# Patient Record
Sex: Male | Born: 1960
Health system: Southern US, Community
[De-identification: ages and names within clinical notes are randomized; demographics above are authoritative.]

## PROBLEM LIST (undated history)

## (undated) DIAGNOSIS — M1990 Unspecified osteoarthritis, unspecified site: Secondary | ICD-10-CM

## (undated) DIAGNOSIS — E785 Hyperlipidemia, unspecified: Secondary | ICD-10-CM

## (undated) DIAGNOSIS — I2510 Atherosclerotic heart disease of native coronary artery without angina pectoris: Secondary | ICD-10-CM

## (undated) DIAGNOSIS — R7303 Prediabetes: Secondary | ICD-10-CM

## (undated) DIAGNOSIS — I509 Heart failure, unspecified: Secondary | ICD-10-CM

## (undated) DIAGNOSIS — I350 Nonrheumatic aortic (valve) stenosis: Secondary | ICD-10-CM

## (undated) DIAGNOSIS — E669 Obesity, unspecified: Secondary | ICD-10-CM

## (undated) DIAGNOSIS — I10 Essential (primary) hypertension: Secondary | ICD-10-CM

## (undated) HISTORY — DX: Atherosclerotic heart disease of native coronary artery without angina pectoris: I25.10

## (undated) HISTORY — DX: Hyperlipidemia, unspecified: E78.5

## (undated) HISTORY — PX: TONSILLECTOMY: SUR1361

---

## 2016-12-26 ENCOUNTER — Emergency Department (HOSPITAL_COMMUNITY): Payer: Medicaid Other

## 2016-12-26 ENCOUNTER — Encounter (HOSPITAL_COMMUNITY): Payer: Self-pay | Admitting: *Deleted

## 2016-12-26 ENCOUNTER — Inpatient Hospital Stay (HOSPITAL_COMMUNITY)
Admission: EM | Admit: 2016-12-26 | Discharge: 2016-12-31 | DRG: 247 | Disposition: A | Discharge status: Home / Self Care | Payer: Medicaid Other | Attending: Family Medicine | Admitting: Family Medicine

## 2016-12-26 DIAGNOSIS — I351 Nonrheumatic aortic (valve) insufficiency: Secondary | ICD-10-CM | POA: Diagnosis not present

## 2016-12-26 DIAGNOSIS — E876 Hypokalemia: Secondary | ICD-10-CM | POA: Diagnosis not present

## 2016-12-26 DIAGNOSIS — G4733 Obstructive sleep apnea (adult) (pediatric): Secondary | ICD-10-CM | POA: Diagnosis not present

## 2016-12-26 DIAGNOSIS — I5021 Acute systolic (congestive) heart failure: Secondary | ICD-10-CM | POA: Diagnosis not present

## 2016-12-26 DIAGNOSIS — I472 Ventricular tachycardia: Secondary | ICD-10-CM | POA: Diagnosis not present

## 2016-12-26 DIAGNOSIS — I340 Nonrheumatic mitral (valve) insufficiency: Secondary | ICD-10-CM

## 2016-12-26 DIAGNOSIS — Z8249 Family history of ischemic heart disease and other diseases of the circulatory system: Secondary | ICD-10-CM

## 2016-12-26 DIAGNOSIS — I272 Pulmonary hypertension, unspecified: Secondary | ICD-10-CM | POA: Diagnosis present

## 2016-12-26 DIAGNOSIS — E785 Hyperlipidemia, unspecified: Secondary | ICD-10-CM | POA: Diagnosis present

## 2016-12-26 DIAGNOSIS — I2510 Atherosclerotic heart disease of native coronary artery without angina pectoris: Secondary | ICD-10-CM | POA: Diagnosis present

## 2016-12-26 DIAGNOSIS — N289 Disorder of kidney and ureter, unspecified: Secondary | ICD-10-CM | POA: Diagnosis not present

## 2016-12-26 DIAGNOSIS — R0609 Other forms of dyspnea: Secondary | ICD-10-CM | POA: Diagnosis not present

## 2016-12-26 DIAGNOSIS — R001 Bradycardia, unspecified: Secondary | ICD-10-CM | POA: Diagnosis not present

## 2016-12-26 DIAGNOSIS — I5041 Acute combined systolic (congestive) and diastolic (congestive) heart failure: Secondary | ICD-10-CM | POA: Diagnosis not present

## 2016-12-26 DIAGNOSIS — M1990 Unspecified osteoarthritis, unspecified site: Secondary | ICD-10-CM | POA: Diagnosis present

## 2016-12-26 DIAGNOSIS — I493 Ventricular premature depolarization: Secondary | ICD-10-CM | POA: Diagnosis not present

## 2016-12-26 DIAGNOSIS — I420 Dilated cardiomyopathy: Secondary | ICD-10-CM | POA: Diagnosis present

## 2016-12-26 DIAGNOSIS — Z87891 Personal history of nicotine dependence: Secondary | ICD-10-CM

## 2016-12-26 DIAGNOSIS — I350 Nonrheumatic aortic (valve) stenosis: Secondary | ICD-10-CM | POA: Diagnosis not present

## 2016-12-26 DIAGNOSIS — R739 Hyperglycemia, unspecified: Secondary | ICD-10-CM | POA: Diagnosis not present

## 2016-12-26 DIAGNOSIS — R7303 Prediabetes: Secondary | ICD-10-CM | POA: Diagnosis present

## 2016-12-26 DIAGNOSIS — R0600 Dyspnea, unspecified: Secondary | ICD-10-CM | POA: Diagnosis present

## 2016-12-26 DIAGNOSIS — I110 Hypertensive heart disease with heart failure: Secondary | ICD-10-CM | POA: Diagnosis not present

## 2016-12-26 DIAGNOSIS — E669 Obesity, unspecified: Secondary | ICD-10-CM | POA: Diagnosis not present

## 2016-12-26 DIAGNOSIS — Z791 Long term (current) use of non-steroidal anti-inflammatories (NSAID): Secondary | ICD-10-CM

## 2016-12-26 DIAGNOSIS — I5040 Unspecified combined systolic (congestive) and diastolic (congestive) heart failure: Secondary | ICD-10-CM | POA: Insufficient documentation

## 2016-12-26 DIAGNOSIS — I509 Heart failure, unspecified: Secondary | ICD-10-CM

## 2016-12-26 DIAGNOSIS — I080 Rheumatic disorders of both mitral and aortic valves: Secondary | ICD-10-CM | POA: Diagnosis not present

## 2016-12-26 DIAGNOSIS — M1611 Unilateral primary osteoarthritis, right hip: Secondary | ICD-10-CM | POA: Diagnosis present

## 2016-12-26 DIAGNOSIS — I10 Essential (primary) hypertension: Secondary | ICD-10-CM | POA: Diagnosis not present

## 2016-12-26 DIAGNOSIS — Z955 Presence of coronary angioplasty implant and graft: Secondary | ICD-10-CM

## 2016-12-26 HISTORY — DX: Unspecified osteoarthritis, unspecified site: M19.90

## 2016-12-26 HISTORY — DX: Atherosclerotic heart disease of native coronary artery without angina pectoris: I25.10

## 2016-12-26 HISTORY — DX: Obesity, unspecified: E66.9

## 2016-12-26 HISTORY — DX: Prediabetes: R73.03

## 2016-12-26 HISTORY — DX: Essential (primary) hypertension: I10

## 2016-12-26 LAB — BASIC METABOLIC PANEL
Anion gap: 8 (ref 5–15)
BUN: 14 mg/dL (ref 6–20)
CHLORIDE: 104 mmol/L (ref 101–111)
CO2: 29 mmol/L (ref 22–32)
Calcium: 9.4 mg/dL (ref 8.9–10.3)
Creatinine, Ser: 1.18 mg/dL (ref 0.61–1.24)
GFR calc Af Amer: >60 mL/min (ref >60)
GFR calc non Af Amer: >60 mL/min (ref >60)
GLUCOSE: 149 mg/dL — AB (ref 65–99)
Potassium: 4.5 mmol/L (ref 3.5–5.1)
SODIUM: 141 mmol/L (ref 135–145)

## 2016-12-26 LAB — CBC
HEMATOCRIT: 46.4 % (ref 39.0–52.0)
HEMATOCRIT: 46.3 % (ref 39.0–52.0)
HEMOGLOBIN: 15.2 g/dL (ref 13.0–17.0)
Hemoglobin: 15.1 g/dL (ref 13.0–17.0)
MCH: 29.3 pg (ref 26.0–34.0)
MCH: 29.6 pg (ref 26.0–34.0)
MCHC: 32.5 g/dL (ref 30.0–36.0)
MCHC: 32.8 g/dL (ref 30.0–36.0)
MCV: 89.9 fL (ref 78.0–100.0)
MCV: 90.1 fL (ref 78.0–100.0)
Platelets: 168 10*3/uL (ref 150–400)
Platelets: 174 10*3/uL (ref 150–400)
RBC: 5.16 MIL/uL (ref 4.22–5.81)
RBC: 5.14 MIL/uL (ref 4.22–5.81)
RDW: 13.7 % (ref 11.5–15.5)
RDW: 13.8 % (ref 11.5–15.5)
WBC: 9 10*3/uL (ref 4.0–10.5)
WBC: 8.3 10*3/uL (ref 4.0–10.5)

## 2016-12-26 LAB — I-STAT TROPONIN, ED: Troponin i, poc: 0.02 ng/mL (ref 0.00–0.08)

## 2016-12-26 LAB — BRAIN NATRIURETIC PEPTIDE: B NATRIURETIC PEPTIDE 5: 1551 pg/mL — AB (ref 0.0–100.0)

## 2016-12-26 LAB — CREATININE, SERUM
Creatinine, Ser: 1.14 mg/dL (ref 0.61–1.24)
GFR calc Af Amer: >60 mL/min (ref >60)
GFR calc non Af Amer: >60 mL/min (ref >60)

## 2016-12-26 LAB — TROPONIN I: TROPONIN I: 0.03 ng/mL — AB (ref <0.03)

## 2016-12-26 LAB — GLUCOSE, CAPILLARY: GLUCOSE-CAPILLARY: 117 mg/dL — AB (ref 65–99)

## 2016-12-26 MED ORDER — FUROSEMIDE 10 MG/ML IJ SOLN
40.0000 mg | Freq: Once | INTRAMUSCULAR | Status: AC
  Administered 2016-12-26: 40 mg via INTRAVENOUS
  Filled 2016-12-26: qty 4

## 2016-12-26 MED ORDER — ENOXAPARIN SODIUM 40 MG/0.4ML ~~LOC~~ SOLN
40.0000 mg | SUBCUTANEOUS | Status: DC
  Administered 2016-12-26 – 2016-12-30 (×4): 40 mg via SUBCUTANEOUS
  Filled 2016-12-26 (×4): qty 0.4

## 2016-12-26 MED ORDER — LABETALOL HCL 5 MG/ML IV SOLN: 10.0000 mg | INTRAVENOUS | Status: DC | PRN

## 2016-12-26 MED ORDER — ASPIRIN 81 MG PO CHEW
324.0000 mg | CHEWABLE_TABLET | Freq: Once | ORAL | Status: AC
  Administered 2016-12-26: 324 mg via ORAL
  Filled 2016-12-26: qty 4

## 2016-12-26 MED ORDER — ACETAMINOPHEN 325 MG PO TABS: 650.0000 mg | ORAL_TABLET | Freq: Four times a day (QID) | ORAL | Status: DC | PRN

## 2016-12-26 MED ORDER — LISINOPRIL 10 MG PO TABS
10.0000 mg | ORAL_TABLET | Freq: Every day | ORAL | Status: DC
  Administered 2016-12-26: 10 mg via ORAL
  Filled 2016-12-26 (×2): qty 1

## 2016-12-26 MED ORDER — ACETAMINOPHEN 650 MG RE SUPP: 650.0000 mg | Freq: Four times a day (QID) | RECTAL | Status: DC | PRN

## 2016-12-26 MED ORDER — INFLUENZA VAC SPLIT QUAD 0.5 ML IM SUSY
0.5000 mL | PREFILLED_SYRINGE | INTRAMUSCULAR | Status: AC
Start: 2016-12-27 — End: 2016-12-27
  Administered 2016-12-27: 0.5 mL via INTRAMUSCULAR
  Filled 2016-12-26: qty 0.5

## 2016-12-26 NOTE — ED Provider Notes (Signed)
[facility] DEPT Provider Note   CSN: [id number] Arrival date & time: 12/26/16  1419     History   Chief Complaint Chief Complaint  Patient presents with  . Shortness of Breath  . Chest Pain    HPI Joshua [name] is a 56 y.o. male.  HPI   For the last month has had progressive shortness of breath and chest pain.  At night waking up feeling short of breath, having to sit up in the middle of the night.  Has significant dyspnea on exertion.  Even walking short distances will feel dyspnea.  Just walking into the ED felt dyspnea. Severe. Chest pressure tightness when feeling short of breath.   Past Medical History:  Diagnosis Date  . Hypertension     Patient Active Problem List   Diagnosis Date Noted  . Acute congestive heart failure ([place])   . Hypertension   . Hyperglycemia   . Dyspnea 12/26/2016    History reviewed. No pertinent surgical history.     Home Medications    Prior to Admission medications   Not on File    Family History History reviewed. No pertinent family history.  Social History Social History  Substance Use Topics  . Smoking status: Former Research scientist (life sciences)  . Smokeless tobacco: Never Used  . Alcohol use Yes     Comment: occ     Allergies   Patient has no known allergies.   Review of Systems Review of Systems  Constitutional: Positive for fatigue. Negative for fever.  HENT: Negative for sore throat.   Eyes: Negative for visual disturbance.  Respiratory: Positive for chest tightness, shortness of breath and wheezing.   Cardiovascular: Positive for chest pain and leg swelling.  Gastrointestinal: Negative for abdominal pain, nausea and vomiting.  Genitourinary: Negative for difficulty urinating.  Musculoskeletal: Negative for back pain and neck stiffness.  Skin: Negative for rash.  Neurological: Positive for light-headedness. Negative for syncope and headaches.     Physical Exam Updated Vital Signs BP (!) 175/90 (BP Location:  Right Arm)   Pulse 70   Temp 97.9 F (36.6 C) (Oral)   Resp 18   Ht 6\' 1"  (1.854 m)   Wt 112.9 kg (248 lb 14.4 oz)   SpO2 98%   BMI 32.84 kg/m   Physical Exam  Constitutional: He is oriented to person, place, and time. He appears well-developed and well-nourished. No distress.  HENT:  Head: Normocephalic and atraumatic.  Eyes: Conjunctivae and EOM are normal.  Neck: Normal range of motion. JVD present.  Cardiovascular: Normal rate, regular rhythm, normal heart sounds and intact distal pulses.  Exam reveals no gallop and no friction rub.   No murmur heard. Pulmonary/Chest: Effort normal. No respiratory distress. He has no wheezes. He has rales.  Abdominal: Soft. He exhibits no distension. There is no tenderness. There is no guarding.  Musculoskeletal: He exhibits edema (2+).  Neurological: He is alert and oriented to person, place, and time.  Skin: Skin is warm and dry. He is not diaphoretic.  Nursing note and vitals reviewed.    ED Treatments / Results  Labs (all labs ordered are listed, but only abnormal results are displayed) Labs Reviewed  BASIC METABOLIC PANEL - Abnormal; Notable for the following:       Result Value   Glucose, Bld 149 (*)    All other components within normal limits  BRAIN NATRIURETIC PEPTIDE - Abnormal; Notable for the following:    B Natriuretic Peptide 1,551.0 (*)  All other components within normal limits  TROPONIN I - Abnormal; Notable for the following:    Troponin I 0.03 (*)    All other components within normal limits  TROPONIN I - Abnormal; Notable for the following:    Troponin I 0.03 (*)    All other components within normal limits  BASIC METABOLIC PANEL - Abnormal; Notable for the following:    Glucose, Bld 117 (*)    All other components within normal limits  GLUCOSE, CAPILLARY - Abnormal; Notable for the following:    Glucose-Capillary 117 (*)    All other components within normal limits  HEMOGLOBIN A1C - Abnormal; Notable for  the following:    Hgb A1c MFr Bld 5.8 (*)    All other components within normal limits  LIPID PANEL - Abnormal; Notable for the following:    HDL 29 (*)    All other components within normal limits  CBC  CBC  CREATININE, SERUM  TROPONIN I  CBC  TSH  HIV ANTIBODY (ROUTINE TESTING)  I-STAT TROPONIN, ED    EKG  EKG Interpretation  Date/Time:  Friday December 26 2016 14:30:13 EDT Ventricular Rate:  78 PR Interval:  176 QRS Duration: 98 QT Interval:  408 QTC Calculation: 465 R Axis:   53 Text Interpretation:  Normal sinus rhythm Nonspecific ST and T wave abnormality Abnormal ECG No previous ECGs available Confirmed by Gareth Morgan [id number]) on 12/26/2016 4:37:18 PM       Radiology Dg Chest 2 View  Result Date: 12/26/2016 CLINICAL DATA:  Chest tightness. EXAM: CHEST  2 VIEW COMPARISON:  None. FINDINGS: Cardiomegaly with pulmonary vascular prominence and bilateral interstitial prominence with bilateral pleural effusions. No pneumothorax. No pneumothorax. No acute bony abnormality. IMPRESSION: Congestive heart failure with pulmonary interstitial edema and bilateral pleural effusions. Electronically Signed   By: Marcello Moores  Register   On: 12/26/2016 15:04    Procedures Procedures (including critical care time)  Medications Ordered in ED Medications  enoxaparin (LOVENOX) injection 40 mg (40 mg Subcutaneous Given 12/26/16 2101)  acetaminophen (TYLENOL) tablet 650 mg (not administered)    Or  acetaminophen (TYLENOL) suppository 650 mg (not administered)  labetalol (NORMODYNE,TRANDATE) injection 10 mg (not administered)  atorvastatin (LIPITOR) tablet 20 mg (not administered)  aspirin EC tablet 81 mg (81 mg Oral Given 12/27/16 1000)  lisinopril (PRINIVIL,ZESTRIL) tablet 20 mg (20 mg Oral Given 12/27/16 1130)  furosemide (LASIX) injection 40 mg (40 mg Intravenous Given 12/26/16 1709)  aspirin chewable tablet 324 mg (324 mg Oral Given 12/26/16 1704)  Influenza vac split quadrivalent PF  (FLUARIX) injection 0.5 mL (0.5 mLs Intramuscular Given 12/27/16 1000)  furosemide (LASIX) injection 40 mg (40 mg Intravenous Given 12/27/16 0815)     Initial Impression / Assessment and Plan / ED Course  I have reviewed the triage vital signs and the nursing notes.  Pertinent labs & imaging results that were available during my care of the patient were reviewed by me and considered in my medical decision making (see chart for details).     56yo male with history of hypertension presents for 4 weeks of increasing dyspnea, exertional dyspnea, chest tightness, bilateral lower extremity edema.  History and clinical exam concerning for new diagnosis of CHF.  Troponin negative. Given aspirin and lasix. Will admit for continued evaluation.   Final Clinical Impressions(s) / ED Diagnoses   Final diagnoses:  Acute congestive heart failure, unspecified heart failure type [facility])    New Prescriptions There are no discharge medications for this patient.  Gareth Morgan, MD 12/27/16 [phone]

## 2016-12-26 NOTE — ED Notes (Signed)
Attempted report x1. 

## 2016-12-26 NOTE — H&P (Signed)
Joshua [name] Admission History and Physical Service Pager: [phone]  Patient name: Joshua [name] [facility] [name] Medical record number: [id number] Date of birth: 17-Sep-1960 Age: 56 y.o. Gender: male  Primary Care Provider: Patient, No Pcp Per Consultants: none Code Status: full (obtained on admission).   Chief Complaint: Shortness of breath  Assessment and Plan: Joshua HAGERMAN is a 56 y.o. male presenting with gradually worsening dyspnea . PMH is significant for HTN (not on medication), ?OSA and osteoarthritis  Dyspnea: Likely due to new CHF from long-standing untreated hypertension and NSAID use. He has orthopnea, PND and bilateral edema. Exam remarkable for bibasilar crackles. He also have 2/6 SEM over RUSB and LVH concerning for some aortic stenosis. CXR consistent with CHF. BNP is still pending. He has remarkable urine output (about 2 L) and significant improvement in his symptoms after IV Lasix 40 mg in ED. He also reports chest tightness likely due to CHF versus ACS. EKG without ischemic changes. Initial troponin negative. His chest pain-free now after diuresis. Doubt PE with risk score 0. No fever or leukocytosis to suggest infectious etiology. -Admit to telemetry. Attending Dr. Mingo Amber -Redose Lasix in the morning -Follow up BNP -Strict I&O and daily weights -Echocardiogram -Repeat BMP in the morning -Rule out ACS as below -Treat hypertension as below  Chest tightness: Likely due to CHF versus ACS. He is chest pain-free now. No history of CAD. Initial EKG without significant ischemic finding. Initial troponin negative. Status post ASA 325 mg once in ED. Given significant family history (father with CABG at 28 years of age) we will cycle troponins and EKG to rule out ACS. -Cycle troponin -Repeat EKG in the morning -CHF as above -Risk stratification lab (lipid panel and A1c  Hypertension: Initial BP 174/109 in ED. Trended down to 152/101 with IV Lasix. Not  on medication at home. -Started lisinopril 10 mg daily. He has good renal function -When necessary labetalol IV 10 mg for SBP greater than 180 or D BP greater than 100 -BMP as above -May need a sleep study as outpatient to rule out OSA. Sister reports history of snoring.   History of prediabetes: He says he used to be on metformin. He says his last A1c was 5.6 many years ago. BMP glucose 149. -CBG every 12 hours -Can initiate SSI based on CBG -Check A1c  Osteoarthritis of right hip: this is a chronic condition. Has been managing his pain with NSAID -Advised him to stop NSAID -Tylenol as needed  FEN/GI:  -Heart healthy diet  Prophylaxis:  Lovenox  Disposition: Admit to telemetry for evaluation and management of new CHF  History of Present Illness:  Joshua [name] is a 56 y.o. male presenting with dyspnea. History provided by the patient and his sister at bedside.   Patient reports having dyspnea for about a month that has gotten worse over the last 2 weeks. He also endorses paroxysmal nocturnal dyspnea, and orthopnea when he lies on his backfor the last 3 weeks. He says he normally sleeps on his side. He still uses only one pillow at night. He also reports some chest tightness and pressure sensation. Denies radiation to his jaw or his arms. Denies nausea or diaphoresis. He is chest pain-free now. He also noticed some leg swelling bilaterally. He denies recent long car ride or travel. He has cough with clear sputum for the last 3 weeks. He denies hemoptysis.  Patient has history of hypertension. He hasn't been on any medication for the last 5  years. He is also on ibuprofen and Excedrin for osteoarthritis of his right hip. He reports watching his salt intake.Marland Kitchen He drinks socially. He denies recreational drug use except smoking marijuana. He quit smoking cigarettes about 10 years ago. He has about 10-pack-year history prior to that. He denies recent illness.  He was treated for left leg  cellulitis with a 10 days course of Bactrim about 3 months ago.  ED course: Vital signs remarkable for blood pressure to 174/109 on arrival to ED. BMP, CBC and initial troponin basically unremarkable. ECG without significant ischemic changes but LVH. CXR with CHF with pulmonary interstitial edema and bilateral pleural effusions. BNP pending. He was given IV Lasix 40 mg and put out about 2 L of urine already. He has significant improvement in his dyspnea.   Review Of Systems:   Review of Systems  Constitutional: Negative for chills, diaphoresis and fever.  HENT: Negative for ear pain and tinnitus.   Eyes: Negative for blurred vision and photophobia.  Respiratory: Positive for cough, sputum production and shortness of breath. Negative for hemoptysis and wheezing.        Clear sputum  Cardiovascular: Positive for orthopnea, leg swelling and PND. Negative for chest pain.  Gastrointestinal: Negative for abdominal pain, blood in stool, diarrhea, heartburn, melena and vomiting.  Genitourinary: Negative for dysuria and hematuria.  Musculoskeletal: Negative for back pain and myalgias.  Skin: Negative for rash.  Neurological: Negative for dizziness, sensory change, speech change, focal weakness and headaches.  Psychiatric/Behavioral: Positive for substance abuse. Negative for depression.       Marijuana   There are no active problems to display for this patient.   Past Medical History: Past Medical History:  Diagnosis Date  . Hypertension     Past Surgical History: History reviewed. No pertinent surgical history.  Social History: Social History  Substance Use Topics  . Smoking status: Former Research scientist (life sciences)  . Smokeless tobacco: Not on file  . Alcohol use Yes     Comment: occ   Additional social history: Smokes marijuana Please also refer to relevant sections of EMR.  Family History: History reviewed. No pertinent family history. Father with heart attack and CABG at age 35  Allergies and  Medications: No Known Allergies No current facility-administered medications on file prior to encounter.    No current outpatient prescriptions on file prior to encounter.    Objective: BP (!) 152/101   Pulse 73   Temp 98.3 F (36.8 C) (Oral)   Resp (!) 23   SpO2 94%  Exam: GEN: appears well, no apparent distress. Head: normocephalic and atraumatic  Eyes: conjunctiva without injection, sclera anicteric Oropharynx: mmm without erythema or exudation HEM: negative for cervical or periauricular lymphadenopathies CVS: RRR, nl s1 & s2, 2/6 SEM over RUSB, trace edema bilaterally, no JVD or HJR RESP: no IWOB, good air movement bilaterally, fine bibasilar crackles bilaterally, no wheezing GI: Limited exam due to body habitus, BS present & normal, soft, NTND GU: no suprapubic or CVA tenderness. About 2 L of clear looking urine in 2 urinals MSK: no focal tenderness or notable swelling SKIN: Small scars on his legs bilaterally. No signs of infection or cellulitis. Likely from previous cellulitis infections ENDO: negative thyromegally NEURO: alert and oiented appropriately, no gross deficits  PSYCH: euthymic mood with congruent affect  Labs and Imaging: CBC BMET   Recent Labs Lab 12/26/16 1441  WBC 9.0  HGB 15.1  HCT 46.4  PLT 168    Recent Labs Lab 12/26/16  1441  NA 141  K 4.5  CL 104  CO2 29  BUN 14  CREATININE 1.18  GLUCOSE 149*  CALCIUM 9.4     Dg Chest 2 View  Result Date: 12/26/2016 CLINICAL DATA:  Chest tightness. EXAM: CHEST  2 VIEW COMPARISON:  None. FINDINGS: Cardiomegaly with pulmonary vascular prominence and bilateral interstitial prominence with bilateral pleural effusions. No pneumothorax. No pneumothorax. No acute bony abnormality. IMPRESSION: Congestive heart failure with pulmonary interstitial edema and bilateral pleural effusions. Electronically Signed   By: Marcello Moores  Register   On: 12/26/2016 15:04    Mercy Riding, MD 12/26/2016, 6:44 PM PGY-3, [facility] Intern pager: [phone], text pages welcome

## 2016-12-26 NOTE — ED Notes (Signed)
Patient is stable and ready to be transport to the floor at this time.  Report was called to 3E RN.  Belongings taken with the patient to the floor.   

## 2016-12-26 NOTE — ED Triage Notes (Signed)
Pt reports ongoing sob for extended amount of time. Sob gets worse with mild exertion. Has mild swelling noted to his legs. States his sob wakes him up in the middle of the night with sob and chest tightness.

## 2016-12-26 NOTE — Progress Notes (Signed)
CRITICAL VALUE ALERT  Critical Value:  Troponin 0.03  Date & Time Notied:  12/26/2016   Provider Notified: Teaching service resident  Orders Received/Actions taken: No orders at this time

## 2016-12-27 ENCOUNTER — Other Ambulatory Visit: Payer: Self-pay

## 2016-12-27 ENCOUNTER — Inpatient Hospital Stay (HOSPITAL_COMMUNITY): Payer: Medicaid Other

## 2016-12-27 DIAGNOSIS — R739 Hyperglycemia, unspecified: Secondary | ICD-10-CM

## 2016-12-27 DIAGNOSIS — I509 Heart failure, unspecified: Secondary | ICD-10-CM

## 2016-12-27 DIAGNOSIS — I351 Nonrheumatic aortic (valve) insufficiency: Secondary | ICD-10-CM

## 2016-12-27 DIAGNOSIS — I5040 Unspecified combined systolic (congestive) and diastolic (congestive) heart failure: Secondary | ICD-10-CM | POA: Insufficient documentation

## 2016-12-27 DIAGNOSIS — I340 Nonrheumatic mitral (valve) insufficiency: Secondary | ICD-10-CM

## 2016-12-27 DIAGNOSIS — I10 Essential (primary) hypertension: Secondary | ICD-10-CM

## 2016-12-27 LAB — BASIC METABOLIC PANEL
ANION GAP: 11 (ref 5–15)
BUN: 11 mg/dL (ref 6–20)
CALCIUM: 9.2 mg/dL (ref 8.9–10.3)
CO2: 24 mmol/L (ref 22–32)
Chloride: 105 mmol/L (ref 101–111)
Creatinine, Ser: 1.15 mg/dL (ref 0.61–1.24)
GFR calc Af Amer: >60 mL/min (ref >60)
GFR calc non Af Amer: >60 mL/min (ref >60)
GLUCOSE: 117 mg/dL — AB (ref 65–99)
POTASSIUM: 3.9 mmol/L (ref 3.5–5.1)
Sodium: 140 mmol/L (ref 135–145)

## 2016-12-27 LAB — LIPID PANEL
Cholesterol: 133 mg/dL (ref 0–200)
HDL: 29 mg/dL — ABNORMAL LOW (ref >40)
LDL CALC: 86 mg/dL (ref 0–99)
TRIGLYCERIDES: 91 mg/dL (ref <150)
Total CHOL/HDL Ratio: 4.6 RATIO
VLDL: 18 mg/dL (ref 0–40)

## 2016-12-27 LAB — CBC
HEMATOCRIT: 46.8 % (ref 39.0–52.0)
HEMOGLOBIN: 15.4 g/dL (ref 13.0–17.0)
MCH: 29.5 pg (ref 26.0–34.0)
MCHC: 32.9 g/dL (ref 30.0–36.0)
MCV: 89.7 fL (ref 78.0–100.0)
PLATELETS: 162 10*3/uL (ref 150–400)
RBC: 5.22 MIL/uL (ref 4.22–5.81)
RDW: 13.8 % (ref 11.5–15.5)
WBC: 8.8 10*3/uL (ref 4.0–10.5)

## 2016-12-27 LAB — ECHOCARDIOGRAM COMPLETE
HEIGHTINCHES: 73 in
WEIGHTICAEL: 3982.4 [oz_av]

## 2016-12-27 LAB — TSH: TSH: 2.493 u[IU]/mL (ref 0.350–4.500)

## 2016-12-27 LAB — TROPONIN I
TROPONIN I: 0.03 ng/mL — AB (ref <0.03)
Troponin I: <0.03 ng/mL (ref <0.03)

## 2016-12-27 LAB — HIV ANTIBODY (ROUTINE TESTING W REFLEX): HIV SCREEN 4TH GENERATION: NONREACTIVE

## 2016-12-27 LAB — HEMOGLOBIN A1C
HEMOGLOBIN A1C: 5.8 % — AB (ref 4.8–5.6)
Mean Plasma Glucose: 119.76 mg/dL

## 2016-12-27 MED ORDER — ASPIRIN EC 81 MG PO TBEC
81.0000 mg | DELAYED_RELEASE_TABLET | Freq: Every day | ORAL | Status: DC
  Administered 2016-12-27 – 2016-12-28 (×2): 81 mg via ORAL
  Filled 2016-12-27 (×3): qty 1

## 2016-12-27 MED ORDER — FUROSEMIDE 10 MG/ML IJ SOLN
40.0000 mg | Freq: Once | INTRAMUSCULAR | Status: AC
  Administered 2016-12-27: 40 mg via INTRAVENOUS
  Filled 2016-12-27: qty 4

## 2016-12-27 MED ORDER — ATORVASTATIN CALCIUM 20 MG PO TABS
20.0000 mg | ORAL_TABLET | Freq: Every day | ORAL | Status: DC
  Administered 2016-12-27 – 2016-12-29 (×3): 20 mg via ORAL
  Filled 2016-12-27 (×4): qty 1

## 2016-12-27 MED ORDER — LISINOPRIL 20 MG PO TABS
20.0000 mg | ORAL_TABLET | Freq: Every day | ORAL | Status: DC
  Administered 2016-12-27: 20 mg via ORAL
  Filled 2016-12-27 (×2): qty 1

## 2016-12-27 MED ORDER — LISINOPRIL 20 MG PO TABS: 20.0000 mg | ORAL_TABLET | Freq: Every day | ORAL | Status: DC

## 2016-12-27 NOTE — Progress Notes (Signed)
Family Medicine Teaching Service Daily Progress Note Intern Pager: [phone]  Patient name: Joshua [name] [facility] Medical record number: [id number] Date of birth: Nov 23, 1960 Age: 56 y.o. Gender: male  Primary Care Provider: Patient, No Pcp Per Consultants: none Code Status: full  Pt Overview and Major Events to Date:  9/14: Admitted with new CHF  Assessment and Plan: ADRIAN DINOVO is a 56 year old male with history of hypertension,? OSA and osteoarthritis who presents with gradually worsening dyspnea for one month and found to have CHF  New CHF: Likely due to untreated hypertension and NSAID use. CXR with pulmonary congestion. BNP elevated to 1551. Good response to IV Lasix 40 mg in ED. He had a urine output of 3.3L. Lung exam without crackles. 2/6 SEM over RUSB -Redose IV Lasix 40 mg. -Strict I and O's and daily weights -Echocardiogram -Follow up BMP -Advised him to stop NSAID -Treat hypertension as below  Chest tightness: Resolved. Troponin 0.032. EKG without ischemic finding. ASCVD risk 12.5%, mainly due to hypertension. This risk might change significantly if hypertension is well controlled -Start atorvastatin and aspirin -Manage CHF as above -Cath or outpatient stress depending on echo finding  Hypertension: BP 175/90. 174/109 on admission -Started lisinopril 10 mg. May increase to 20 after BMP -Lasix as above  Hyperglycemia: A1c 5.6% -stop CBG  OA of right hip: Chronic. Stable. -Advised him to stop NSAID -Tylenol as needed  FEN/GI:  -Heart healthy diet  PPx:  -Lovenox  Disposition: Continue telemetry pending clinical improvement and further evaluation  Subjective:  Reports significant improvement in his dyspnea. He says chest tightness has resolved. Making good urine.  Objective: Temp:  [97.9 F (36.6 C)-98.3 F (36.8 C)] 97.9 F (36.6 C) (09/15 0425) Pulse Rate:  [70-81] 70 (09/15 0425) Resp:  [15-23] 18 (09/15 0425) BP: (152-178)/(90-109) 175/90  (09/15 0425) SpO2:  [87 %-98 %] 98 % (09/15 0425) Weight:  [248 lb 14.4 oz (112.9 kg)-250 lb 3.2 oz (113.5 kg)] 248 lb 14.4 oz (112.9 kg) (09/15 0425) Physical Exam: General: Appears well, no apparent distress Cardiovascular: RRR, S1 and S2 heard, 2/6 SEM over RUSB, no notable JVD or edema Respiratory: On room air, no increased work of breathing, CTA B Abdomen: Limited exam due to body habitus, bowel sounds normal, no tenderness to palpation Extremities: No edema or focal tenderness  Laboratory:  Recent Labs Lab 12/26/16 1441 12/26/16 2113  WBC 9.0 8.3  HGB 15.1 15.2  HCT 46.4 46.3  PLT 168 174    Recent Labs Lab 12/26/16 1441 12/26/16 2113  NA 141  --   K 4.5  --   CL 104  --   CO2 29  --   BUN 14  --   CREATININE 1.18 1.14  CALCIUM 9.4  --   GLUCOSE 149*  --     Imaging/Diagnostic Tests: Dg Chest 2 View  Result Date: 12/26/2016 CLINICAL DATA:  Chest tightness. EXAM: CHEST  2 VIEW COMPARISON:  None. FINDINGS: Cardiomegaly with pulmonary vascular prominence and bilateral interstitial prominence with bilateral pleural effusions. No pneumothorax. No pneumothorax. No acute bony abnormality. IMPRESSION: Congestive heart failure with pulmonary interstitial edema and bilateral pleural effusions. Electronically Signed   By: Marcello Moores  Register   On: 12/26/2016 15:04    Mercy Riding, MD 12/27/2016, 6:51 AM PGY-3, [place] Intern pager: [phone], text pages welcome

## 2016-12-27 NOTE — Progress Notes (Signed)
Echocardiogram 2D Echocardiogram has been performed.  Matilde Bash 12/27/2016, 10:09 AM

## 2016-12-28 ENCOUNTER — Encounter (HOSPITAL_COMMUNITY): Payer: Self-pay | Admitting: Physician Assistant

## 2016-12-28 DIAGNOSIS — I340 Nonrheumatic mitral (valve) insufficiency: Secondary | ICD-10-CM

## 2016-12-28 DIAGNOSIS — N289 Disorder of kidney and ureter, unspecified: Secondary | ICD-10-CM

## 2016-12-28 DIAGNOSIS — R0609 Other forms of dyspnea: Secondary | ICD-10-CM

## 2016-12-28 DIAGNOSIS — E669 Obesity, unspecified: Secondary | ICD-10-CM

## 2016-12-28 DIAGNOSIS — I5021 Acute systolic (congestive) heart failure: Secondary | ICD-10-CM

## 2016-12-28 DIAGNOSIS — I350 Nonrheumatic aortic (valve) stenosis: Secondary | ICD-10-CM

## 2016-12-28 DIAGNOSIS — I351 Nonrheumatic aortic (valve) insufficiency: Secondary | ICD-10-CM

## 2016-12-28 LAB — BASIC METABOLIC PANEL
Anion gap: 7 (ref 5–15)
BUN: 13 mg/dL (ref 6–20)
CALCIUM: 9 mg/dL (ref 8.9–10.3)
CHLORIDE: 106 mmol/L (ref 101–111)
CO2: 29 mmol/L (ref 22–32)
CREATININE: 1.28 mg/dL — AB (ref 0.61–1.24)
GFR calc non Af Amer: >60 mL/min (ref >60)
GLUCOSE: 117 mg/dL — AB (ref 65–99)
Potassium: 3.7 mmol/L (ref 3.5–5.1)
Sodium: 142 mmol/L (ref 135–145)

## 2016-12-28 LAB — CBC
HCT: 47 % (ref 39.0–52.0)
Hemoglobin: 15.4 g/dL (ref 13.0–17.0)
MCH: 29.2 pg (ref 26.0–34.0)
MCHC: 32.8 g/dL (ref 30.0–36.0)
MCV: 89 fL (ref 78.0–100.0)
PLATELETS: 176 10*3/uL (ref 150–400)
RBC: 5.28 MIL/uL (ref 4.22–5.81)
RDW: 13.6 % (ref 11.5–15.5)
WBC: 8.5 10*3/uL (ref 4.0–10.5)

## 2016-12-28 LAB — MAGNESIUM: MAGNESIUM: 2 mg/dL (ref 1.7–2.4)

## 2016-12-28 MED ORDER — SODIUM CHLORIDE 0.9% FLUSH
3.0000 mL | INTRAVENOUS | Status: DC | PRN
Start: 2016-12-28 — End: 2016-12-29

## 2016-12-28 MED ORDER — LIVING BETTER WITH HEART FAILURE BOOK
Freq: Once | Status: AC
  Administered 2016-12-28: 10:00:00

## 2016-12-28 MED ORDER — HYDROXYZINE HCL 25 MG PO TABS
25.0000 mg | ORAL_TABLET | Freq: Every evening | ORAL | Status: DC | PRN
  Administered 2016-12-29: 25 mg via ORAL
  Filled 2016-12-28 (×2): qty 1

## 2016-12-28 MED ORDER — ASPIRIN 81 MG PO CHEW
81.0000 mg | CHEWABLE_TABLET | ORAL | Status: AC
  Administered 2016-12-29: 81 mg via ORAL
  Filled 2016-12-28: qty 1

## 2016-12-28 MED ORDER — SODIUM CHLORIDE 0.9 % IV SOLN
INTRAVENOUS | Status: DC
  Administered 2016-12-29: 07:00:00 via INTRAVENOUS

## 2016-12-28 MED ORDER — SODIUM CHLORIDE 0.9% FLUSH
3.0000 mL | Freq: Two times a day (BID) | INTRAVENOUS | Status: DC
  Administered 2016-12-28 – 2016-12-29 (×2): 3 mL via INTRAVENOUS

## 2016-12-28 MED ORDER — CARVEDILOL 3.125 MG PO TABS
3.1250 mg | ORAL_TABLET | Freq: Two times a day (BID) | ORAL | Status: DC
  Administered 2016-12-28 – 2016-12-29 (×3): 3.125 mg via ORAL
  Filled 2016-12-28 (×3): qty 1

## 2016-12-28 MED ORDER — POTASSIUM CHLORIDE CRYS ER 20 MEQ PO TBCR
40.0000 meq | EXTENDED_RELEASE_TABLET | Freq: Once | ORAL | Status: AC
  Administered 2016-12-28: 40 meq via ORAL
  Filled 2016-12-28: qty 2

## 2016-12-28 MED ORDER — DOCUSATE SODIUM 100 MG PO CAPS
100.0000 mg | ORAL_CAPSULE | Freq: Two times a day (BID) | ORAL | Status: DC
  Administered 2016-12-28 – 2016-12-31 (×6): 100 mg via ORAL
  Filled 2016-12-28 (×7): qty 1

## 2016-12-28 MED ORDER — SODIUM CHLORIDE 0.9 % IV SOLN: 250.0000 mL | INTRAVENOUS | Status: DC | PRN

## 2016-12-28 MED ORDER — LISINOPRIL 20 MG PO TABS
20.0000 mg | ORAL_TABLET | Freq: Every day | ORAL | Status: DC
  Administered 2016-12-28: 20 mg via ORAL

## 2016-12-28 NOTE — Progress Notes (Signed)
13 beat run of v-tach. MD at the bedside. BP 150/102. Pt asymptomatic.

## 2016-12-28 NOTE — Consult Note (Signed)
Cardiology Consultation:   Patient ID: Joshua [name]; [id number]; Oct 19, 1960   Admit date: 12/26/2016 Date of Consult: 12/28/2016  Primary Care Provider: Patient, No Pcp Per Primary Cardiologist: New  Chief Complaint: "my sister made me come - I've been feeling short of breath"  Patient Profile:   Joshua [name] is a 56 y.o. male with a hx of HTN, OA (on chronic ibuprofen/Tylenol), obesity, pre-diabetes, former light tobacco abuse and family history of CAD (CABG in father age 7s) who is being seen today for the evaluation of new onset CHF and severe aortic stenosis at the request of Dr. Lindell Noe.  History of Present Illness:   He was remotely on BP medications but has not followed up with a PCP for quite some time. He denies prior cardiac history or knowledge of a murmur. For the past 2 weeks, he has noticed worsening orthopnea, DOE, and mild lower extremity edema. He also had a sensation of possible chest tightness as well but he states he thinks this was more due to feeling short of breath. His sister convinced him that his color didn't look good and urged him to come to the hospital. CXR c/w CHF and pulmonary interstitial edema and bilateral pleural effusions. Labs showed BNP 1551, troponin 0.02-0.03-0.03-<0.03, CBC wnl, Cr 1.18-1.15-1.28, Na 142, TSH wnl, A1C 5.8. 2D Echo 12/27/16 showed mild LVH, EF 25-30%, severe diffuse HK, grade 2 DD, mild-moderate AI, severe aortic stenosis (low gradient, due to low cardiac output), cannot exclude bicuspid valve, mild-moderate mitral regurgitation, severely dilated left atrium, moderately dilated right ventricle with mildly reduced RV systolic function, mildly dilated RA. He received 2 doses of IV Lasix 40mg  thus far and reports improvement in breathing but still feels he's carrying some extra fluid, pointing to his abdomen. No further LEE. He reports baseline weight around 235 when feeling well. Weight 250->248->240 with -7.4L out so  far.  Past Medical History:  Diagnosis Date  . Hypertension   . Obesity   . Osteoarthritis   . Pre-diabetes    a. prior A1C 5.6, states he was on meds for this at one point.    History reviewed. No pertinent surgical history.   Inpatient Medications: Scheduled Meds: . aspirin EC  81 mg Oral Daily  . atorvastatin  20 mg Oral q1800  . docusate sodium  100 mg Oral BID  . enoxaparin (LOVENOX) injection  40 mg Subcutaneous Q24H  . lisinopril  20 mg Oral Daily  . Living Better with Heart Failure Book   Does not apply Once   Continuous Infusions:  PRN Meds: acetaminophen **OR** acetaminophen, labetalol  Home Meds: Prior to Admission medications   Not on File    Allergies:   No Known Allergies  Social History:   Social History   Social History  . Marital status: Single    Spouse name: N/A  . Number of children: N/A  . Years of education: N/A   Occupational History  . Not on file.   Social History Main Topics  . Smoking status: Former Research scientist (life sciences)  . Smokeless tobacco: Never Used     Comment: Smoked lightly for approx 20 years, quit 1990s  . Alcohol use Yes     Comment: 1-2 mixed drinks per week  . Drug use: Yes    Types: Marijuana  . Sexual activity: Not on file   Other Topics Concern  . Not on file   Social History Narrative   Lives Home alone. Sister makes all health related decisions.  Declined Advanced directive at this time    Family History:   The patient's family history includes CAD in his father.  ROS:  Please see the history of present illness.  All other ROS reviewed and negative.     Physical Exam/Data:   Vitals:   12/27/16 0425 12/27/16 1241 12/27/16 1959 12/28/16 0510  BP: (!) 175/90 (!) 170/94 (!) 154/90 (!) 142/70  Pulse: 70 64 72 81  Resp: 18 20 20 20   Temp: 97.9 F (36.6 C) 98.2 F (36.8 C) 98.4 F (36.9 C) 97.9 F (36.6 C)  TempSrc: Oral Oral Oral Oral  SpO2: 98% 96% 94% 93%  Weight: 248 lb 14.4 oz (112.9 kg)   240 lb 6.4 oz (109  kg)  Height:        Intake/Output Summary (Last 24 hours) at 12/28/16 1041 Last data filed at 12/28/16 0955  Gross per 24 hour  Intake              720 ml  Output             4350 ml  Net            -3630 ml   Filed Weights   12/26/16 1900 12/27/16 0425 12/28/16 0510  Weight: 250 lb 3.2 oz (113.5 kg) 248 lb 14.4 oz (112.9 kg) 240 lb 6.4 oz (109 kg)   Body mass index is 31.72 kg/m.  General: Well developed, well nourished WM, obese, in no acute distress. Head: Normocephalic, atraumatic, sclera non-icteric, no xanthomas, nares are without discharge.  Neck: Soft carotid hum bilaterally suspect radiating from aortic valve.  JVD not elevated. Lungs: Diminished BS at bases, without wheezes, rales, or rhonchi. Breathing is unlabored. Heart: RRR. 3/6 SEM RUSB with diminished S2, also with soft radiation of murmur to LSB, No rubs or gallops appreciated. Abdomen: Soft, non-tender, non-distended with normoactive bowel sounds. No hepatomegaly. No rebound/guarding. No obvious abdominal masses. Msk:  Strength and tone appear normal for age. Extremities: No clubbing or cyanosis. No edema.  Distal pedal pulses are 2+ and equal bilaterally. Neuro: Alert and oriented X 3. No facial asymmetry. No focal deficit. Moves all extremities spontaneously. Psych:  Responds to questions appropriately with a normal affect.  EKG:  The EKG was personally reviewed and demonstrates NSR 70bpm, nonspecific ST-T changes diffusely  Relevant CV Studies: 2d echo 12/27/16 Study Conclusions  - Left ventricle: The cavity size was mildly dilated. Wall   thickness was increased in a pattern of mild LVH. Systolic   function was severely reduced. The estimated ejection fraction   was in the range of 25% to 30%. Severe diffuse hypokinesis with   no identifiable regional variations. Features are consistent with   a pseudonormal left ventricular filling pattern, with concomitant   abnormal relaxation and increased filling  pressure (grade 2   diastolic dysfunction). - Aortic valve: A bicuspid morphology cannot be excluded;   moderately thickened, moderately calcified leaflets. Valve   mobility was restricted. Transvalvular velocity was increased   less than expected, due to low cardiac output. There was severe   stenosis. There was mild to moderate regurgitation directed   centrally in the LVOT. Valve area (VTI): 0.93 cm^2. Valve area   (Vmax): 1.1 cm^2. Valve area (Vmean): 1.01 cm^2. - Mitral valve: There was mild to moderate regurgitation directed   centrally. - Left atrium: The atrium was severely dilated. - Right ventricle: The cavity size was moderately dilated. Systolic   function was mildly reduced. - Right  atrium: The atrium was mildly dilated.  Impressions:  - Low gradient severe aortic stenosis with severely reduced left   ventricular systolic function.  Laboratory Data:  Chemistry Recent Labs Lab 12/26/16 1441 12/26/16 2113 12/27/16 0829 12/28/16 0351  NA 141  --  140 142  K 4.5  --  3.9 3.7  CL 104  --  105 106  CO2 29  --  24 29  GLUCOSE 149*  --  117* 117*  BUN 14  --  11 13  CREATININE 1.18 1.14 1.15 1.28*  CALCIUM 9.4  --  9.2 9.0  GFRNONAA >60 >60 >60 >60  GFRAA >60 >60 >60 >60  ANIONGAP 8  --  11 7    No results for input(s): PROT, ALBUMIN, AST, ALT, ALKPHOS, BILITOT in the last 168 hours. Hematology Recent Labs Lab 12/26/16 2113 12/27/16 0829 12/28/16 0351  WBC 8.3 8.8 8.5  RBC 5.14 5.22 5.28  HGB 15.2 15.4 15.4  HCT 46.3 46.8 47.0  MCV 90.1 89.7 89.0  MCH 29.6 29.5 29.2  MCHC 32.8 32.9 32.8  RDW 13.8 13.8 13.6  PLT 174 162 176   Cardiac Enzymes Recent Labs Lab 12/26/16 2113 12/27/16 0238 12/27/16 0829  TROPONINI 0.03* 0.03* <0.03    Recent Labs Lab 12/26/16 1446  TROPIPOC 0.02    BNP Recent Labs Lab 12/26/16 2113  BNP 1,551.0*     Radiology/Studies:  Dg Chest 2 View  Result Date: 12/26/2016 CLINICAL DATA:  Chest tightness. EXAM:  CHEST  2 VIEW COMPARISON:  None. FINDINGS: Cardiomegaly with pulmonary vascular prominence and bilateral interstitial prominence with bilateral pleural effusions. No pneumothorax. No pneumothorax. No acute bony abnormality. IMPRESSION: Congestive heart failure with pulmonary interstitial edema and bilateral pleural effusions. Electronically Signed   By: Marcello Moores  Register   On: 12/26/2016 15:04    Assessment and Plan:   1. Acute combined CHF, also with signs of RV dysfunction - pt has had 10lb weight loss and -7L UOP. He is feeling much better. He still feels as though he has some fluid in his abdomen but Cr bumped slightly. I will review further diuretic regimen with Dr. Sallyanne Kuster. Will need optimization of his CHF regimen, with consideration given to initiation of beta blocker or spironolactone during admission if pressure will support this. He will need R/L heart cath to evaluate for underlying coronary artery disease and further assess valve disease. Will likely need CVTS consultation thereafter. Risks and benefits of cardiac catheterization have been discussed with the patient. These include bleeding, infection, kidney damage, stroke, heart attack, death. The patient understands these risks and is willing to proceed. I'll plan to review final recs with MD. Will also ask dietitian to see him regarding new CHF education and rx Living Better With Heart Failure book.  2. Valvular disease with severe aortic stenosis, mild-moderate aortic insufficiency and mild-moderate mitral regurgitation - discussed need for cath with patient. See above. Suspect he will need surgery this admission once pre-operative assessments have been completed.  3. NSVT (brief, PVCs, 4 beat) on telemetry - will give KCl 24meq x 1 now to keep goal potassium >4. Add mag level. Wrote care order to call cardiology of 1.8 or less.  4. Mild renal insufficiency, not clear if AKI or mild CKD r/t untreated HTN - follow for now. Baseline Cr not  really clear.  5. HTN, untreated prior to admission - improving with initiation of therapies.  Signed, Charlie Pitter, PA-C  12/28/2016 10:41 AM   I have  seen and examined the patient along with Charlie Pitter, PA-C.  I have reviewed the chart, notes and new data.  I agree with PA's note.  Key new complaints: feeling much better, can lie flat without dyspnea. No chest pain currently, but has had some vague chest discomfort. Edema has resolved. 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 Clear lungs. Key new findings / data: echo reviewed and is highly suggestive of low gradient severe AS.   PLAN: Right and left heart cath tomorrow. If he has multivessel CAD and needs CABG, AVR is indicated anyway. If cath does not show significant, further evaluation of the degree of AS may be necessary. Mean gradient is almost 30 mm Hg, so I believe LV will recover after surgery, but may need a dobutamine echo as well, to confirm severe AS. This procedure has been fully reviewed with the patient and written informed consent has been obtained.   Sanda Klein, MD, [place] [phone] 12/28/2016, 11:15 AM

## 2016-12-28 NOTE — Progress Notes (Signed)
Family Medicine Teaching Service Daily Progress Note Intern Pager: [phone]  Patient name: Joshua [name] [facility] Medical record number: [id number] Date of birth: 1960/11/28 Age: 56 y.o. Gender: male  Primary Care Provider: Patient, No Pcp Per Consultants: none Code Status: full  Pt Overview and Major Events to Date:  9/14: Admitted with new CHF 9/15: echo with new findings of EF 25-30%, diffuse hypokinesis, and severe AS  Assessment and Plan: Joshua [name] is a 56 year old male with history of hypertension,? OSA and osteoarthritis who presents with gradually worsening dyspnea for one month and found to have CHF.   New CHF: Likely due to untreated hypertension and NSAID use. Does report father with extensive CAD hx including CABG at 58 yo and subsequent stenting (still alive at 43). S/p IV Lasix 40 mg QD x 2 doses. 2/6 SEM over RUSB. Echo with new findings of EF 25-30%, diffuse hypokinesis, and severe AS.  -IV Lasix 40 mg QD -cards consult for AS and HF, appreciate recs -Strict I and O's and daily weights -Follow up BMP -Advised him to stop NSAID -Treat hypertension as below -NPO MN for possible cath 9/17 -lisinopril as below  Chest tightness: Resolved. Troponin 0.032. EKG without ischemic finding. ASCVD risk 12.5%, mainly due to hypertension. This risk might change significantly if hypertension is well controlled.  -Start atorvastatin and aspirin -Manage CHF as above -anticipate need for cath as above  Hypertension: BP 175/90 on admission, 142/70 on 9/16.  -lisinopril 20mg  -Lasix as above  OA of right hip: Chronic. Stable. -Advised him to stop NSAID -Tylenol as needed  FEN/GI:  -Heart healthy diet  PPx:  -Lovenox  Disposition: Continue telemetry pending cath  Subjective:  Patient feels breathing is much improved this morning, but did feel tired after giving himself a sponge bath. Reports father still living at 78 and hx of CABG at age 30 and multiple stents  thereafter.   Objective: Temp:  [97.9 F (36.6 C)-98.4 F (36.9 C)] 97.9 F (36.6 C) (09/16 0510) Pulse Rate:  [64-81] 81 (09/16 0510) Resp:  [20] 20 (09/16 0510) BP: (142-170)/(70-94) 142/70 (09/16 0510) SpO2:  [93 %-96 %] 93 % (09/16 0510) Weight:  [240 lb 6.4 oz (109 kg)] 240 lb 6.4 oz (109 kg) (09/16 0510) Physical Exam: General: Appears well, no apparent distress Cardiovascular: RRR, S1 and S2 heard, 2/6 SEM over RUSB, no notable JVD or edema Respiratory: On room air, no increased work of breathing, CTA B Abdomen: Limited exam due to body habitus, bowel sounds normal, no tenderness to palpation Extremities: No edema or focal tenderness  Laboratory:  Recent Labs Lab 12/26/16 2113 12/27/16 0829 12/28/16 0351  WBC 8.3 8.8 8.5  HGB 15.2 15.4 15.4  HCT 46.3 46.8 47.0  PLT 174 162 176    Recent Labs Lab 12/26/16 1441 12/26/16 2113 12/27/16 0829 12/28/16 0351  NA 141  --  140 142  K 4.5  --  3.9 3.7  CL 104  --  105 106  CO2 29  --  24 29  BUN 14  --  11 13  CREATININE 1.18 1.14 1.15 1.28*  CALCIUM 9.4  --  9.2 9.0  GLUCOSE 149*  --  117* 117*    Imaging/Diagnostic Tests: No results found.  Echo with EF 25-30%, diffuse hypokinesis, and severe AS  Sela Hilding, MD 12/28/2016, 8:14 AM PGY-2, [place] Intern pager: [phone], text pages welcome

## 2016-12-29 ENCOUNTER — Other Ambulatory Visit: Payer: Self-pay

## 2016-12-29 ENCOUNTER — Encounter (HOSPITAL_COMMUNITY)
Admission: EM | Disposition: A | Discharge status: Home / Self Care | Payer: Self-pay | Source: Home / Self Care | Attending: Family Medicine

## 2016-12-29 DIAGNOSIS — I472 Ventricular tachycardia: Secondary | ICD-10-CM

## 2016-12-29 DIAGNOSIS — I351 Nonrheumatic aortic (valve) insufficiency: Secondary | ICD-10-CM

## 2016-12-29 DIAGNOSIS — I2510 Atherosclerotic heart disease of native coronary artery without angina pectoris: Secondary | ICD-10-CM

## 2016-12-29 DIAGNOSIS — I509 Heart failure, unspecified: Secondary | ICD-10-CM

## 2016-12-29 HISTORY — PX: RIGHT/LEFT HEART CATH AND CORONARY ANGIOGRAPHY: CATH118266

## 2016-12-29 HISTORY — PX: CORONARY STENT INTERVENTION: CATH118234

## 2016-12-29 LAB — BASIC METABOLIC PANEL
ANION GAP: 8 (ref 5–15)
Anion gap: 8 (ref 5–15)
BUN: 13 mg/dL (ref 6–20)
BUN: 11 mg/dL (ref 6–20)
CHLORIDE: 106 mmol/L (ref 101–111)
CO2: 24 mmol/L (ref 22–32)
CO2: 23 mmol/L (ref 22–32)
CREATININE: 0.91 mg/dL (ref 0.61–1.24)
Calcium: 9.1 mg/dL (ref 8.9–10.3)
Calcium: 9.3 mg/dL (ref 8.9–10.3)
Chloride: 108 mmol/L (ref 101–111)
Creatinine, Ser: 0.96 mg/dL (ref 0.61–1.24)
GFR calc Af Amer: >60 mL/min (ref >60)
GFR calc Af Amer: >60 mL/min (ref >60)
GFR calc non Af Amer: >60 mL/min (ref >60)
GFR calc non Af Amer: >60 mL/min (ref >60)
GLUCOSE: 101 mg/dL — AB (ref 65–99)
GLUCOSE: 187 mg/dL — AB (ref 65–99)
POTASSIUM: 3.4 mmol/L — AB (ref 3.5–5.1)
Potassium: 3.4 mmol/L — ABNORMAL LOW (ref 3.5–5.1)
Sodium: 140 mmol/L (ref 135–145)
Sodium: 137 mmol/L (ref 135–145)

## 2016-12-29 LAB — POCT I-STAT 3, ART BLOOD GAS (G3+)
ACID-BASE DEFICIT: 3 mmol/L — AB (ref 0.0–2.0)
Acid-base deficit: 1 mmol/L (ref 0.0–2.0)
BICARBONATE: 21.3 mmol/L (ref 20.0–28.0)
Bicarbonate: 23 mmol/L (ref 20.0–28.0)
O2 SAT: 62 %
O2 SAT: 90 %
PCO2 ART: 37.4 mmHg (ref 32.0–48.0)
TCO2: 22 mmol/L (ref 22–32)
TCO2: 24 mmol/L (ref 22–32)
pCO2 arterial: 34.2 mmHg (ref 32.0–48.0)
pH, Arterial: 7.398 (ref 7.350–7.450)
pH, Arterial: 7.402 (ref 7.350–7.450)
pO2, Arterial: 32 mmHg — CL (ref 83.0–108.0)
pO2, Arterial: 59 mmHg — ABNORMAL LOW (ref 83.0–108.0)

## 2016-12-29 LAB — CBC
HEMATOCRIT: 48.4 % (ref 39.0–52.0)
Hemoglobin: 16.3 g/dL (ref 13.0–17.0)
MCH: 30.1 pg (ref 26.0–34.0)
MCHC: 33.7 g/dL (ref 30.0–36.0)
MCV: 89.5 fL (ref 78.0–100.0)
Platelets: 152 10*3/uL (ref 150–400)
RBC: 5.41 MIL/uL (ref 4.22–5.81)
RDW: 14.2 % (ref 11.5–15.5)
WBC: 9.3 10*3/uL (ref 4.0–10.5)

## 2016-12-29 LAB — BRAIN NATRIURETIC PEPTIDE: B Natriuretic Peptide: 1289.4 pg/mL — ABNORMAL HIGH (ref 0.0–100.0)

## 2016-12-29 LAB — POCT ACTIVATED CLOTTING TIME
ACTIVATED CLOTTING TIME: 279 s
Activated Clotting Time: 208 seconds

## 2016-12-29 LAB — PROTIME-INR
INR: 1.13
Prothrombin Time: 14.4 seconds (ref 11.4–15.2)

## 2016-12-29 LAB — MAGNESIUM: Magnesium: 2 mg/dL (ref 1.7–2.4)

## 2016-12-29 SURGERY — RIGHT/LEFT HEART CATH AND CORONARY ANGIOGRAPHY
Anesthesia: LOCAL

## 2016-12-29 MED ORDER — POTASSIUM CHLORIDE CRYS ER 20 MEQ PO TBCR
40.0000 meq | EXTENDED_RELEASE_TABLET | Freq: Once | ORAL | Status: AC
  Administered 2016-12-29: 40 meq via ORAL
  Filled 2016-12-29: qty 2

## 2016-12-29 MED ORDER — TIROFIBAN (AGGRASTAT) BOLUS VIA INFUSION
INTRAVENOUS | Status: DC | PRN
  Administered 2016-12-29: 2695 ug via INTRAVENOUS

## 2016-12-29 MED ORDER — VERAPAMIL HCL 2.5 MG/ML IV SOLN
INTRAVENOUS | Status: DC | PRN
  Administered 2016-12-29: 10 mL via INTRA_ARTERIAL

## 2016-12-29 MED ORDER — HYDRALAZINE HCL 20 MG/ML IJ SOLN
5.0000 mg | INTRAMUSCULAR | Status: AC | PRN
  Administered 2016-12-29 (×2): 5 mg via INTRAVENOUS
  Filled 2016-12-29 (×2): qty 1

## 2016-12-29 MED ORDER — SALINE SPRAY 0.65 % NA SOLN
1.0000 | NASAL | Status: DC | PRN
  Administered 2016-12-29: 1 via NASAL
  Filled 2016-12-29 (×2): qty 44

## 2016-12-29 MED ORDER — FUROSEMIDE 10 MG/ML IJ SOLN
INTRAMUSCULAR | Status: AC
  Filled 2016-12-29: qty 8

## 2016-12-29 MED ORDER — TIROFIBAN HCL IN NACL 5-0.9 MG/100ML-% IV SOLN
INTRAVENOUS | Status: AC
  Filled 2016-12-29: qty 100

## 2016-12-29 MED ORDER — CARVEDILOL 3.125 MG PO TABS: 6.2500 mg | ORAL_TABLET | Freq: Two times a day (BID) | ORAL | Status: DC

## 2016-12-29 MED ORDER — FENTANYL CITRATE (PF) 100 MCG/2ML IJ SOLN
INTRAMUSCULAR | Status: AC
  Filled 2016-12-29: qty 2

## 2016-12-29 MED ORDER — HEPARIN SODIUM (PORCINE) 1000 UNIT/ML IJ SOLN
INTRAMUSCULAR | Status: AC
  Filled 2016-12-29: qty 1

## 2016-12-29 MED ORDER — MIDAZOLAM HCL 2 MG/2ML IJ SOLN
INTRAMUSCULAR | Status: DC | PRN
  Administered 2016-12-29 (×2): 1 mg via INTRAVENOUS
  Administered 2016-12-29: 2 mg via INTRAVENOUS

## 2016-12-29 MED ORDER — FENTANYL CITRATE (PF) 100 MCG/2ML IJ SOLN
INTRAMUSCULAR | Status: DC | PRN
  Administered 2016-12-29: 25 ug via INTRAVENOUS
  Administered 2016-12-29: 50 ug via INTRAVENOUS
  Administered 2016-12-29: 25 ug via INTRAVENOUS

## 2016-12-29 MED ORDER — SODIUM CHLORIDE 0.9 % IV SOLN: 250.0000 mL | INTRAVENOUS | Status: DC | PRN

## 2016-12-29 MED ORDER — TIROFIBAN HCL IN NACL 5-0.9 MG/100ML-% IV SOLN
INTRAVENOUS | Status: DC | PRN
  Administered 2016-12-29: 0.15 ug/kg/min via INTRAVENOUS

## 2016-12-29 MED ORDER — TICAGRELOR 90 MG PO TABS
ORAL_TABLET | ORAL | Status: DC | PRN
  Administered 2016-12-29: 180 mg via ORAL

## 2016-12-29 MED ORDER — HEPARIN (PORCINE) IN NACL 2-0.9 UNIT/ML-% IJ SOLN
INTRAMUSCULAR | Status: AC
  Filled 2016-12-29: qty 1000

## 2016-12-29 MED ORDER — VERAPAMIL HCL 2.5 MG/ML IV SOLN
INTRAVENOUS | Status: AC
  Filled 2016-12-29: qty 2

## 2016-12-29 MED ORDER — FUROSEMIDE 10 MG/ML IJ SOLN
80.0000 mg | Freq: Once | INTRAMUSCULAR | Status: AC
  Administered 2016-12-29: 80 mg via INTRAVENOUS
  Filled 2016-12-29: qty 8

## 2016-12-29 MED ORDER — SODIUM CHLORIDE 0.9% FLUSH: 3.0000 mL | INTRAVENOUS | Status: DC | PRN

## 2016-12-29 MED ORDER — LIVING BETTER WITH HEART FAILURE BOOK
Freq: Once | Status: AC
  Administered 2016-12-29: 22:00:00

## 2016-12-29 MED ORDER — IOPAMIDOL (ISOVUE-370) INJECTION 76%
INTRAVENOUS | Status: AC
  Filled 2016-12-29: qty 100

## 2016-12-29 MED ORDER — LIDOCAINE HCL (PF) 1 % IJ SOLN
INTRAMUSCULAR | Status: AC
  Filled 2016-12-29: qty 30

## 2016-12-29 MED ORDER — LABETALOL HCL 5 MG/ML IV SOLN: 10.0000 mg | INTRAVENOUS | Status: AC | PRN

## 2016-12-29 MED ORDER — NITROGLYCERIN 1 MG/10 ML FOR IR/CATH LAB
INTRA_ARTERIAL | Status: DC | PRN
  Administered 2016-12-29 (×3): 200 ug via INTRACORONARY
  Administered 2016-12-29: 300 ug via INTRA_ARTERIAL

## 2016-12-29 MED ORDER — ANGIOPLASTY BOOK
Freq: Once | Status: AC
  Administered 2016-12-29: 22:00:00
  Filled 2016-12-29: qty 1

## 2016-12-29 MED ORDER — LIDOCAINE HCL (PF) 1 % IJ SOLN
INTRAMUSCULAR | Status: DC | PRN
  Administered 2016-12-29 (×2): 2 mL via INTRADERMAL

## 2016-12-29 MED ORDER — HYDRALAZINE HCL 20 MG/ML IJ SOLN: 5.0000 mg | INTRAMUSCULAR | Status: DC | PRN

## 2016-12-29 MED ORDER — HEPARIN SODIUM (PORCINE) 1000 UNIT/ML IJ SOLN
INTRAMUSCULAR | Status: DC | PRN
  Administered 2016-12-29: 3000 [IU] via INTRAVENOUS
  Administered 2016-12-29 (×2): 5000 [IU] via INTRAVENOUS

## 2016-12-29 MED ORDER — ONDANSETRON HCL 4 MG/2ML IJ SOLN: 4.0000 mg | Freq: Four times a day (QID) | INTRAMUSCULAR | Status: DC | PRN

## 2016-12-29 MED ORDER — HYDRALAZINE HCL 25 MG PO TABS
25.0000 mg | ORAL_TABLET | Freq: Three times a day (TID) | ORAL | Status: DC
  Administered 2016-12-29 – 2016-12-31 (×5): 25 mg via ORAL
  Filled 2016-12-29 (×5): qty 1

## 2016-12-29 MED ORDER — IOPAMIDOL (ISOVUE-370) INJECTION 76%
INTRAVENOUS | Status: DC | PRN
  Administered 2016-12-29: 180 mL via INTRA_ARTERIAL

## 2016-12-29 MED ORDER — SODIUM CHLORIDE 0.9% FLUSH
3.0000 mL | Freq: Two times a day (BID) | INTRAVENOUS | Status: DC
  Administered 2016-12-29: 22:00:00 3 mL via INTRAVENOUS

## 2016-12-29 MED ORDER — ASPIRIN 81 MG PO CHEW
81.0000 mg | CHEWABLE_TABLET | Freq: Every day | ORAL | Status: DC
  Administered 2016-12-30 – 2016-12-31 (×2): 81 mg via ORAL
  Filled 2016-12-29 (×2): qty 1

## 2016-12-29 MED ORDER — HEPARIN (PORCINE) IN NACL 2-0.9 UNIT/ML-% IJ SOLN
INTRAMUSCULAR | Status: DC | PRN
  Administered 2016-12-29: 1000 mL

## 2016-12-29 MED ORDER — ACETAMINOPHEN 325 MG PO TABS: 650.0000 mg | ORAL_TABLET | ORAL | Status: DC | PRN

## 2016-12-29 MED ORDER — MIDAZOLAM HCL 2 MG/2ML IJ SOLN
INTRAMUSCULAR | Status: AC
  Filled 2016-12-29: qty 2

## 2016-12-29 MED ORDER — TICAGRELOR 90 MG PO TABS
90.0000 mg | ORAL_TABLET | Freq: Two times a day (BID) | ORAL | Status: DC
  Administered 2016-12-30 – 2016-12-31 (×4): 90 mg via ORAL
  Filled 2016-12-29 (×4): qty 1

## 2016-12-29 MED ORDER — TIROFIBAN HCL IN NACL 5-0.9 MG/100ML-% IV SOLN
0.1500 ug/kg/min | INTRAVENOUS | Status: DC
  Filled 2016-12-29: qty 100

## 2016-12-29 SURGICAL SUPPLY — 25 items
BALLN SAPPHIRE 2.5X15 (BALLOONS) ×2
BALLN SAPPHIRE 3.0X20 (BALLOONS) ×2
BALLN ~~LOC~~ EUPHORA RX 3.0X12 (BALLOONS) ×2
BALLOON SAPPHIRE 2.5X15 (BALLOONS) ×1 IMPLANT
BALLOON SAPPHIRE 3.0X20 (BALLOONS) ×1 IMPLANT
BALLOON ~~LOC~~ EUPHORA RX 3.0X12 (BALLOONS) ×1 IMPLANT
CATH BALLN WEDGE 5F 110CM (CATHETERS) ×2 IMPLANT
CATH INFINITI 5 FR JL3.5 (CATHETERS) ×2 IMPLANT
CATH INFINITI JR4 5F (CATHETERS) ×2 IMPLANT
CATH LAUNCHER 6FR EBU 3 (CATHETERS) ×2 IMPLANT
CATH LAUNCHER 6FR JR4 (CATHETERS) ×2 IMPLANT
DEVICE RAD COMP TR BAND LRG (VASCULAR PRODUCTS) ×2 IMPLANT
GLIDESHEATH SLEND SS 6F .021 (SHEATH) ×2 IMPLANT
GUIDEWIRE INQWIRE 1.5J.035X260 (WIRE) ×1 IMPLANT
INQWIRE 1.5J .035X260CM (WIRE) ×2
KIT ENCORE 26 ADVANTAGE (KITS) ×2 IMPLANT
KIT HEART LEFT (KITS) ×2 IMPLANT
PACK CARDIAC CATHETERIZATION (CUSTOM PROCEDURE TRAY) ×2 IMPLANT
SHEATH GLIDE SLENDER 4/5FR (SHEATH) ×2 IMPLANT
STENT RESOLUTE ONYX 2.75X15 (Permanent Stent) ×2 IMPLANT
STENT RESOLUTE ONYX 4.0X30 (Permanent Stent) ×2 IMPLANT
TRANSDUCER W/STOPCOCK (MISCELLANEOUS) ×2 IMPLANT
TUBING CIL FLEX 10 FLL-RA (TUBING) ×2 IMPLANT
VALVE GUARDIAN II ~~LOC~~ HEMO (MISCELLANEOUS) ×2 IMPLANT
WIRE ASAHI PROWATER 180CM (WIRE) ×2 IMPLANT

## 2016-12-29 NOTE — Progress Notes (Signed)
Family Medicine Medical Student Note Daily Progress Note Intern Pager: [phone]  Patient name: Zymir Napoli [facility] Medical record number: [id number] Date of birth: 24-Sep-1960 Age: 56 y.o. Gender: male  Primary Care Provider: Patient, No Pcp Per Consultants: none  Code Status: full  Pt Overview and Major Events to Date:  9/14: Admitted with new CHF 9/15: Echo with new findings of EF 25-30%, diffuse hypokinesis, and severe AS  Assessment and Plan: HARROL NOVELLO is a 56 year old male with history of hypertension,? OSA and osteoarthritis who presents with gradually worsening dyspnea for one month and found to have new onset CHF and severe aortic stenosis.   New HFrEF: Likely due to untreated hypertension and NSAID use. Does report father with extensive CAD hx including CABG at 58 yo and subsequent stenting (still alive at 6). S/p IV Lasix 40 mg QD x 2 doses. Down 8.4 L since admission. 250 lb> 237 lb on 09/17. 2/6 SEM over RUSB. Echo with new findings of EF 25-30%, diffuse hypokinesis, and severe AS.  -cards consult for AS and HF, appreciate recs -Strict I and O's and daily weights -Follow up BMP -Advised him to stop NSAID -Treat hypertension as below -Cath 9/17  Hypokalemia:  - Kdur 29mEq x1   Chest tightness: Resolved. Troponin 0.032. EKG without ischemic finding. ASCVD risk 12.5%, mainly due to hypertension. This risk might change significantly if hypertension is well controlled.  -Started atorvastatin 20mg  (9/15) and aspirin 81mg  (9/15) -Manage CHF as above -Cath as above  Hypertension: BP 175/90 on admission, 147/103 on 9/17.  -Lasix as above  OA of right hip: Chronic. Stable. -Advised him to stop NSAID -Tylenol as needed  FEN/GI: Heart healthy diet PPx: Lovenox  Disposition: Continue telemetry pending cath  Subjective:  Mr. Reichardt did not complain of any overnight events or any acute events this morning. He admits to being anxious about having a cath  this morning. Still feels shortness of breath but that he has had a lot of relief in comparison to when admitted, and some chest pressure that is non radiating and has not changed in intensity. Did not eat after midnight.  Objective: Temp:  [97.9 F (36.6 C)-98.9 F (37.2 C)] 97.9 F (36.6 C) (09/17 0600) Pulse Rate:  [77-85] 81 (09/17 0600) Resp:  [18-20] 20 (09/17 0600) BP: (147-174)/(94-103) 147/103 (09/17 0600) SpO2:  [93 %-96 %] 93 % (09/17 0600) Weight:  [237 lb 11.2 oz (107.8 kg)] 237 lb 11.2 oz (107.8 kg) (09/17 0600) Physical Exam: General: Well appearing male, in no acute distress Cardiovascular: Regular rate and rhythm, S1 and S2, 2/6 systolic murmur over RUSB, no JVD, no peripheral edema, no hepatojugular reflex Respiratory:Mild shortness of breath, on room air, clear to auscultation bilaterally Abdomen: Normal bowel sounds, no tenderness to palpation but does feel pressure Extremities: No UE or LE edema, 2+ radial pulse and DP pulse bilaterally, warm to touch, capillary refill <2s  Laboratory:  Recent Labs Lab 12/27/16 0829 12/28/16 0351 12/29/16 0334  WBC 8.8 8.5 9.3  HGB 15.4 15.4 16.3  HCT 46.8 47.0 48.4  PLT 162 176 152    Recent Labs Lab 12/27/16 0829 12/28/16 0351 12/29/16 0334  NA 140 142 140  K 3.9 3.7 3.4*  CL 105 106 108  CO2 24 29 24   BUN 11 13 13   CREATININE 1.15 1.28* 0.96  CALCIUM 9.2 9.0 9.1  GLUCOSE 117* 117* 101*    Imaging/Diagnostic Tests: No results found. Echo with EF 25-30%, diffuse hypokinesis, and  severe AS  Ophelia Charter, MS4  ________________________________________________________________________ I agree with the medical student's note above, with notable exceptions in the assessment and plan which I have given below.   LOTUS GOVER is a 56 y.o., here with CARL BUTNER is a 56 year old male with history of hypertension,? OSA and osteoarthritis who presented with gradually worsening dyspnea for one month and  found to new acute HFrEF with EF 25-30% with severe AS.    Physical Exam Gen: NAD, resting  HEENT: EOMI, sclerae clear Pulm: normal effort, CTAB CV: RRR, 2/6 systolic murmur, no JVD or peripheral edema  Abd: soft, NT, ND, + bowel sounds Skin: normal capillary refil Extremities: no LE edema MSK: moves all extremities equally Neuro: no focal neurological deficits grossly  Plan:   HFrEF with EF 25-30% and Severe Aortic Stenosis : Euvolemic today. Weight 237 from 250 on admit.  - Coreg 3.125 BID - ASA 81mg  daily - currently holding Lisinopril since patient will have a cath today  - follow up cardiology reccs, appreciate recommendations - per cards will likely need CVTS consult after cath   Hypokalemia: - Kdur 34meQ x 1  HTN:  - coreg 3.125 BID  - holding Lisinopril due to cath - Hydralazine 5mg  q 4 PRN   OA or R hip: - Tylenol PRN  Chest Tightness, resolved: trop 0.03 x 2. EKG unremarkable.  - ASA, Lipitor  - manage CHF as above    Onnie Boer, MD Family Medicine, PGY 3 12/29/2016 9:25 AM

## 2016-12-29 NOTE — Plan of Care (Signed)
Problem: Food- and Nutrition-Related Knowledge Deficit (NB-1.1) Goal: Nutrition education Formal process to instruct or train a patient/client in a skill or to impart knowledge to help patients/clients voluntarily manage or modify food choices and eating behavior to maintain or improve health.  Outcome: Completed/Met Date Met: 12/29/16 Nutrition Education Note  RD consulted for nutrition education regarding new onset CHF.  RD provided "Heart Failure Nutrition Therapy" handout from the Academy of [employer]. Reviewed patient's dietary recall. Provided examples on ways to decrease sodium intake in diet. Discouraged intake of processed foods and use of salt shaker. Encouraged fresh fruits and vegetables as well as whole grain sources of carbohydrates to maximize fiber intake.   RD discussed why it is important for patient to adhere to diet recommendations, and emphasized the role of fluids, foods to avoid, and importance of weighing self daily. Teach back method used.  Expect good compliance.  Body mass index is 31.36 kg/m. Pt meets criteria for obesity unspecified based on current BMI.  Current diet order is NPO for test/procedure.  Pt reports very good appetite currently and PTA. Pt reports he ate 2 dinners last night.  Labs and medications reviewed. No further nutrition interventions warranted at this time. RD contact information provided. If additional nutrition issues arise, please re-consult RD.   Kerman Passey MS, RD, LDN [phone] Pager  [phone] Weekend/On-Call Pager

## 2016-12-29 NOTE — Progress Notes (Addendum)
Pt belongings delivered to [facility]

## 2016-12-29 NOTE — Progress Notes (Addendum)
Called to bedside, patient acutely SOB and diaphoretic. JVP to jaw. LVEDP 24, will give 80 mg IV lasix now. BMET, BNP now. BP elevated (some BP's filed are errors) but RN reports SBP in the 160's. EKG with NSR, LVH, 1st degree AVB. Coreg stopped earlier for transient bradycardia. Tele reviewed. Will add hydralazine 25 mg TID for hypertension/afterload reduction.  Consider adding losartan in the am for BP control and with new CHF.   Arbutus Leas, NP-C 6:45 PM

## 2016-12-29 NOTE — Interval H&P Note (Signed)
Cath Lab Visit (complete for each Cath Lab visit)  Clinical Evaluation Leading to the Procedure:   ACS: Yes.    Non-ACS:    Anginal Classification: CCS IV  Anti-ischemic medical therapy: Minimal Therapy (1 class of medications)  Non-Invasive Test Results: No non-invasive testing performed  Prior CABG: No previous CABG   Severe AS   History and Physical Interval Note:  12/29/2016 1:25 PM  Joshua [name]  has presented today for surgery, with the diagnosis of unstable angina  The various methods of treatment have been discussed with the patient and family. After consideration of risks, benefits and other options for treatment, the patient has consented to  Procedure(s): RIGHT/LEFT HEART CATH AND CORONARY ANGIOGRAPHY (N/A) as a surgical intervention .  The patient's history has been reviewed, patient examined, no change in status, stable for surgery.  I have reviewed the patient's chart and labs.  Questions were answered to the patient's satisfaction.     Larae Grooms

## 2016-12-29 NOTE — Progress Notes (Addendum)
Patient arrived from cath lab approx. 1515.  Approx. 1700, he began to exhibit intermittent tachypnea and c/o SOB.  Pulse oxemetry wnl, lung sounds diminished.  First impression was that this was Brilinta related.  Patient given caffeinated drinks, repositioned in bed, oxygen 2L via [place] applied.  Telemetry showing occasional bradycardia with first degree HB. SOB continued; suspect HF element.  Rodell Perna, NP notified and examined patient.  Patient received from cath lab with blood pressure cuff applied to right leg.  Very high BP readings resulting.  Comparative manual pressures done on right arm which were much lower.  Staff informed of this and false high readings deleted from flowsheets.

## 2016-12-29 NOTE — H&P (View-Only) (Signed)
Progress Note  Patient Name: Joshua [name] Date of Encounter: 12/29/2016  Primary Cardiologist: New (Dr. Sallyanne Kuster)  Subjective   Main complaint today is nasal congestion and HA. No CP or dyspnea currently. Awaiting R/LHC today.   Inpatient Medications    Scheduled Meds: . aspirin EC  81 mg Oral Daily  . atorvastatin  20 mg Oral q1800  . carvedilol  3.125 mg Oral BID WC  . docusate sodium  100 mg Oral BID  . enoxaparin (LOVENOX) injection  40 mg Subcutaneous Q24H  . sodium chloride flush  3 mL Intravenous Q12H   Continuous Infusions: . sodium chloride    . sodium chloride 50 mL/hr at 12/29/16 0700   PRN Meds: sodium chloride, acetaminophen **OR** acetaminophen, hydrALAZINE, hydrOXYzine, sodium chloride, sodium chloride flush   Vital Signs    Vitals:   12/28/16 1216 12/28/16 1240 12/28/16 2041 12/29/16 0600  BP: (!) 150/102 (!) 152/98 (!) 174/94 (!) 147/103  Pulse:  77 85 81  Resp: 18 20 20 20   Temp:  98 F (36.7 C) 98.9 F (37.2 C) 97.9 F (36.6 C)  TempSrc:  Oral Oral Oral  SpO2:  93% 96% 93%  Weight:    237 lb 11.2 oz (107.8 kg)  Height:        Intake/Output Summary (Last 24 hours) at 12/29/16 1027 Last data filed at 12/29/16 0850  Gross per 24 hour  Intake              660 ml  Output             2725 ml  Net            -2065 ml   Filed Weights   12/27/16 0425 12/28/16 0510 12/29/16 0600  Weight: 248 lb 14.4 oz (112.9 kg) 240 lb 6.4 oz (109 kg) 237 lb 11.2 oz (107.8 kg)    Telemetry    NSR with 9 beat run of NSVT overnight - Personally Reviewed  ECG    NSR with poor Rwave progression - Personally Reviewed  Physical Exam   GEN: No acute distress.  Moderately obese  Neck: No JVD Cardiac: RRR, 2/6 SM, loudest at the RUSB Respiratory: Clear to auscultation bilaterally. GI: Soft, nontender, non-distended  MS: No edema; No deformity. Neuro:  Nonfocal  Psych: Normal affect   Labs    Chemistry Recent Labs Lab 12/27/16 0829  12/28/16 0351 12/29/16 0334  NA 140 142 140  K 3.9 3.7 3.4*  CL 105 106 108  CO2 24 29 24   GLUCOSE 117* 117* 101*  BUN 11 13 13   CREATININE 1.15 1.28* 0.96  CALCIUM 9.2 9.0 9.1  GFRNONAA >60 >60 >60  GFRAA >60 >60 >60  ANIONGAP 11 7 8      Hematology Recent Labs Lab 12/27/16 0829 12/28/16 0351 12/29/16 0334  WBC 8.8 8.5 9.3  RBC 5.22 5.28 5.41  HGB 15.4 15.4 16.3  HCT 46.8 47.0 48.4  MCV 89.7 89.0 89.5  MCH 29.5 29.2 30.1  MCHC 32.9 32.8 33.7  RDW 13.8 13.6 14.2  PLT 162 176 152    Cardiac Enzymes Recent Labs Lab 12/26/16 2113 12/27/16 0238 12/27/16 0829  TROPONINI 0.03* 0.03* <0.03    Recent Labs Lab 12/26/16 1446  TROPIPOC 0.02     BNP Recent Labs Lab 12/26/16 2113  BNP 1,551.0*     DDimer No results for input(s): DDIMER in the last 168 hours.   Radiology    No results found.  Cardiac Studies  Study Conclusions  - Left ventricle: The cavity size was mildly dilated. Wall   thickness was increased in a pattern of mild LVH. Systolic   function was severely reduced. The estimated ejection fraction   was in the range of 25% to 30%. Severe diffuse hypokinesis with   no identifiable regional variations. Features are consistent with   a pseudonormal left ventricular filling pattern, with concomitant   abnormal relaxation and increased filling pressure (grade 2   diastolic dysfunction). - Aortic valve: A bicuspid morphology cannot be excluded;   moderately thickened, moderately calcified leaflets. Valve   mobility was restricted. Transvalvular velocity was increased   less than expected, due to low cardiac output. There was severe   stenosis. There was mild to moderate regurgitation directed   centrally in the LVOT. Valve area (VTI): 0.93 cm^2. Valve area   (Vmax): 1.1 cm^2. Valve area (Vmean): 1.01 cm^2. - Mitral valve: There was mild to moderate regurgitation directed   centrally. - Left atrium: The atrium was severely dilated. - Right  ventricle: The cavity size was moderately dilated. Systolic   function was mildly reduced. - Right atrium: The atrium was mildly dilated.  Impressions:  - Low gradient severe aortic stenosis with severely reduced left   ventricular systolic function.  Patient Profile     [name] is a 56 y.o. male with a hx of HTN, OA (on chronic ibuprofen/Tylenol), obesity, pre-diabetes, former light tobacco abuse and family history of CAD (CABG in father age 58s) who is being seen today for the evaluation of new onset CHF, EF 25-30% and severe aortic stenosis at the request of Dr. Lindell Noe.  Assessment & Plan    1. Acute Combined Systolic and Diastolic CHF: Echo reveals low EF at 25-30%. He has diuresed well. -9.5L since admit. No dyspnea at rest. Continue BB therapy with Coreg. Given elevated BP, we will increase to 6.25 mg BID. Consider adding ARB/ Entresto and spironolactone post cath.   2. Valvular disease with severe aortic stenosis, mild-moderate aortic insufficiency and mild-moderate mitral regurgitation: noted on Echo. Awaiting R/LHC today to screen for concomitant CAD. Agree with Dr. Victorino December statements yesterday. If he has multivessel CAD and needs CABG, AVR is indicated anyway. If cath does not show significant, further evaluation of the degree of AS may be necessary (TEE).   3. NSVT: pt with another run of NSVT on telemetry overnight. BP is elevated. Resting HR in the 80s. We will further increase Coreg to 6.25 mg BID. K is 3.4. He was given supplemental K dur 40 mEq earlier today. F/u BMP in the am.  4. Mild Renal Insufficiency: SCr improved, down from 1.2>>0.96. Plan is for [facility] today. Monitor renal function post cath.   5. HTN: elevated in the low 267T diastolic. We will further increase Coreg to 6.25 mg BID.   For questions or updates, please contact [place] Please consult [email or web address] for contact info under Cardiology/STEMI.      Signed, Lyda Jester, PA-C   12/29/2016, 10:3 AM    56 year old with severe aortic stenosis, dilated cardiomyopathy EF 25%, dimension less index of 0.21 on echocardiogram suggestive of severe aortic stenosis awaiting right and left heart catheterization.  Exam reveals an alert male, obese, 2/6 systolic murmur right and left upper sternal border, lungs clear.  Telemetry shows brief non-sustained ventricular tachycardia on telemetry. Personally viewed. We are increasing his carvedilol which will help with his blood pressure as well.  We will likely start angiotensin receptor blocker post  catheterization. Continue to aggressively tackle his hypertension.  Post catheterization will need cardiothoracic surgery consultation. His cardiomyopathy may be from hypertension/aortic stenosis which is likely from bicuspid aortic valve given his age.  Candee Furbish, MD

## 2016-12-29 NOTE — Progress Notes (Signed)
Progress Note  Patient Name: Joshua [name] Date of Encounter: 12/29/2016  Primary Cardiologist: New (Dr. Sallyanne Kuster)  Subjective   Main complaint today is nasal congestion and HA. No CP or dyspnea currently. Awaiting R/LHC today.   Inpatient Medications    Scheduled Meds: . aspirin EC  81 mg Oral Daily  . atorvastatin  20 mg Oral q1800  . carvedilol  3.125 mg Oral BID WC  . docusate sodium  100 mg Oral BID  . enoxaparin (LOVENOX) injection  40 mg Subcutaneous Q24H  . sodium chloride flush  3 mL Intravenous Q12H   Continuous Infusions: . sodium chloride    . sodium chloride 50 mL/hr at 12/29/16 0700   PRN Meds: sodium chloride, acetaminophen **OR** acetaminophen, hydrALAZINE, hydrOXYzine, sodium chloride, sodium chloride flush   Vital Signs    Vitals:   12/28/16 1216 12/28/16 1240 12/28/16 2041 12/29/16 0600  BP: (!) 150/102 (!) 152/98 (!) 174/94 (!) 147/103  Pulse:  77 85 81  Resp: 18 20 20 20   Temp:  98 F (36.7 C) 98.9 F (37.2 C) 97.9 F (36.6 C)  TempSrc:  Oral Oral Oral  SpO2:  93% 96% 93%  Weight:    237 lb 11.2 oz (107.8 kg)  Height:        Intake/Output Summary (Last 24 hours) at 12/29/16 1027 Last data filed at 12/29/16 0850  Gross per 24 hour  Intake              660 ml  Output             2725 ml  Net            -2065 ml   Filed Weights   12/27/16 0425 12/28/16 0510 12/29/16 0600  Weight: 248 lb 14.4 oz (112.9 kg) 240 lb 6.4 oz (109 kg) 237 lb 11.2 oz (107.8 kg)    Telemetry    NSR with 9 beat run of NSVT overnight - Personally Reviewed  ECG    NSR with poor Rwave progression - Personally Reviewed  Physical Exam   GEN: No acute distress.  Moderately obese  Neck: No JVD Cardiac: RRR, 2/6 SM, loudest at the RUSB Respiratory: Clear to auscultation bilaterally. GI: Soft, nontender, non-distended  MS: No edema; No deformity. Neuro:  Nonfocal  Psych: Normal affect   Labs    Chemistry Recent Labs Lab 12/27/16 0829  12/28/16 0351 12/29/16 0334  NA 140 142 140  K 3.9 3.7 3.4*  CL 105 106 108  CO2 24 29 24   GLUCOSE 117* 117* 101*  BUN 11 13 13   CREATININE 1.15 1.28* 0.96  CALCIUM 9.2 9.0 9.1  GFRNONAA >60 >60 >60  GFRAA >60 >60 >60  ANIONGAP 11 7 8      Hematology Recent Labs Lab 12/27/16 0829 12/28/16 0351 12/29/16 0334  WBC 8.8 8.5 9.3  RBC 5.22 5.28 5.41  HGB 15.4 15.4 16.3  HCT 46.8 47.0 48.4  MCV 89.7 89.0 89.5  MCH 29.5 29.2 30.1  MCHC 32.9 32.8 33.7  RDW 13.8 13.6 14.2  PLT 162 176 152    Cardiac Enzymes Recent Labs Lab 12/26/16 2113 12/27/16 0238 12/27/16 0829  TROPONINI 0.03* 0.03* <0.03    Recent Labs Lab 12/26/16 1446  TROPIPOC 0.02     BNP Recent Labs Lab 12/26/16 2113  BNP 1,551.0*     DDimer No results for input(s): DDIMER in the last 168 hours.   Radiology    No results found.  Cardiac Studies  Study Conclusions  - Left ventricle: The cavity size was mildly dilated. Wall   thickness was increased in a pattern of mild LVH. Systolic   function was severely reduced. The estimated ejection fraction   was in the range of 25% to 30%. Severe diffuse hypokinesis with   no identifiable regional variations. Features are consistent with   a pseudonormal left ventricular filling pattern, with concomitant   abnormal relaxation and increased filling pressure (grade 2   diastolic dysfunction). - Aortic valve: A bicuspid morphology cannot be excluded;   moderately thickened, moderately calcified leaflets. Valve   mobility was restricted. Transvalvular velocity was increased   less than expected, due to low cardiac output. There was severe   stenosis. There was mild to moderate regurgitation directed   centrally in the LVOT. Valve area (VTI): 0.93 cm^2. Valve area   (Vmax): 1.1 cm^2. Valve area (Vmean): 1.01 cm^2. - Mitral valve: There was mild to moderate regurgitation directed   centrally. - Left atrium: The atrium was severely dilated. - Right  ventricle: The cavity size was moderately dilated. Systolic   function was mildly reduced. - Right atrium: The atrium was mildly dilated.  Impressions:  - Low gradient severe aortic stenosis with severely reduced left   ventricular systolic function.  Patient Profile     Joshua [name] is a 56 y.o. male with a hx of HTN, OA (on chronic ibuprofen/Tylenol), obesity, pre-diabetes, former light tobacco abuse and family history of CAD (CABG in father age 22s) who is being seen today for the evaluation of new onset CHF, EF 25-30% and severe aortic stenosis at the request of Dr. Lindell Noe.  Assessment & Plan    1. Acute Combined Systolic and Diastolic CHF: Echo reveals low EF at 25-30%. He has diuresed well. -9.5L since admit. No dyspnea at rest. Continue BB therapy with Coreg. Given elevated BP, we will increase to 6.25 mg BID. Consider adding ARB/ Entresto and spironolactone post cath.   2. Valvular disease with severe aortic stenosis, mild-moderate aortic insufficiency and mild-moderate mitral regurgitation: noted on Echo. Awaiting R/LHC today to screen for concomitant CAD. Agree with Dr. Victorino December statements yesterday. If he has multivessel CAD and needs CABG, AVR is indicated anyway. If cath does not show significant, further evaluation of the degree of AS may be necessary (TEE).   3. NSVT: pt with another run of NSVT on telemetry overnight. BP is elevated. Resting HR in the 80s. We will further increase Coreg to 6.25 mg BID. K is 3.4. He was given supplemental K dur 40 mEq earlier today. F/u BMP in the am.  4. Mild Renal Insufficiency: SCr improved, down from 1.2>>0.96. Plan is for [facility] today. Monitor renal function post cath.   5. HTN: elevated in the low 643P diastolic. We will further increase Coreg to 6.25 mg BID.   For questions or updates, please contact [place] Please consult [email or web address] for contact info under Cardiology/STEMI.      Signed, Lyda Jester, PA-C   12/29/2016, 10:29 AM    56 year old with severe aortic stenosis, dilated cardiomyopathy EF 25%, dimension less index of 0.21 on echocardiogram suggestive of severe aortic stenosis awaiting right and left heart catheterization.  Exam reveals an alert male, obese, 2/6 systolic murmur right and left upper sternal border, lungs clear.  Telemetry shows brief non-sustained ventricular tachycardia on telemetry. Personally viewed. We are increasing his carvedilol which will help with his blood pressure as well.  We will likely start angiotensin receptor blocker post  catheterization. Continue to aggressively tackle his hypertension.  Post catheterization will need cardiothoracic surgery consultation. His cardiomyopathy may be from hypertension/aortic stenosis which is likely from bicuspid aortic valve given his age.  Candee Furbish, MD

## 2016-12-29 NOTE — Progress Notes (Signed)
Documentation:   Went to see patient post cath. Is in [place]. Doing okay.  No chest pain. Reports he had some shortness of breath after the procedure but this has eased off after he drank a beverage.   Nursing mentioned that patient's HR would intermittently decrease to 30s. He notified cardiology. Additionally, the most recent BP noted in chart is inaccurate per nursing.   Continue to follow.   Smiley Houseman, MD PGY 3 Family Medicine.

## 2016-12-29 NOTE — Progress Notes (Signed)
TR BAND REMOVAL  LOCATION:    right radial  DEFLATED PER PROTOCOL:    Yes.    TIME BAND OFF / DRESSING APPLIED:    2045   SITE UPON ARRIVAL:    Level 0  SITE AFTER BAND REMOVAL:    Level 0  CIRCULATION SENSATION AND MOVEMENT:    Within Normal Limits   Yes.    COMMENTS:   Pt tolerated well. Radial site teaching reinforced. Verbalizes understanding of activity limitations.

## 2016-12-29 NOTE — Progress Notes (Signed)
Pt belongings packed, awaiting room placement

## 2016-12-29 NOTE — Progress Notes (Signed)
Paged cath lab Pt states MD informed pt he can eat prior to catheterization  Cath lab stated they will place order for clear liquids and plan for catheterization after 1200pm

## 2016-12-29 NOTE — Progress Notes (Signed)
Called by RN. Patient having episodes of bradycardia, 30-40's, transient 1st degree AVB. Will stop Coreg. Continue to monitor.   Arbutus Leas, NP-C 5:49 PM

## 2016-12-29 NOTE — Care Management Note (Addendum)
Case Management Note  Patient Details  Name: Joshua [name] MRN: [id number] Date of Birth: March 19, 1961  Subjective/Objective:  From home alone, s/p coronary intervention, will be on brilinta, patient does not have any insurance or PCP,  NCM scheduled follow up apt at the [facility] on 10/10 at 9:30 am. NCM gave patient the 30 day free coupon, the patient asst application form, the brochure for [facility] and the brochure for the [facility] clinic.  He will still be able to utilize the [facility] pharmacy with an apt at the [facility].  While he is awaiting patient ast, he will be able to get a refill at the [facility] clinic for $10.  Also patient states he is not on any other meds, NCM informed patient if he is started on any other new meds  He will need ast with those meds as well, which he will also get from [facility].    9/18 [place], BSN - NCM was able to get hospital follow up at the [facility] clinic on 9/27 at 10:30 so he will not need to go to patient care center.  NCM will speak with his sister Webb Silversmith [phone] because the patient does not remember information to well.  NCM spoke to [place] , patient's sister, explained to her about the CH[facility] clinic, the application and about the refill for $10 after he runs out of the first 30 days while waiting for patient ast.             Action/Plan: NCM will follow for dc needs.   Expected Discharge Date:  12/29/16               Expected Discharge Plan:  Home/Self Care  In-House Referral:     Discharge planning Services  CM Consult  Post Acute Care Choice:    Choice offered to:     DME Arranged:    DME Agency:     HH Arranged:    HH Agency:     Status of Service:  Completed, signed off  If discussed at [employer] of Stay Meetings, dates discussed:    Additional Comments:  Zenon Mayo, RN 12/29/2016, 4:39 PM

## 2016-12-29 NOTE — Progress Notes (Signed)
Pt requesting nasal spray, ordered per PRN general admission set under manage orders.

## 2016-12-30 ENCOUNTER — Encounter (HOSPITAL_COMMUNITY): Payer: Self-pay | Admitting: Interventional Cardiology

## 2016-12-30 DIAGNOSIS — I5041 Acute combined systolic (congestive) and diastolic (congestive) heart failure: Secondary | ICD-10-CM

## 2016-12-30 LAB — CBC
HEMATOCRIT: 48.8 % (ref 39.0–52.0)
HEMOGLOBIN: 16.3 g/dL (ref 13.0–17.0)
MCH: 29.1 pg (ref 26.0–34.0)
MCHC: 33.4 g/dL (ref 30.0–36.0)
MCV: 87.1 fL (ref 78.0–100.0)
PLATELETS: 174 10*3/uL (ref 150–400)
RBC: 5.6 MIL/uL (ref 4.22–5.81)
RDW: 13.6 % (ref 11.5–15.5)
WBC: 9.5 10*3/uL (ref 4.0–10.5)

## 2016-12-30 LAB — BASIC METABOLIC PANEL
ANION GAP: 6 (ref 5–15)
BUN: 14 mg/dL (ref 6–20)
CALCIUM: 9.3 mg/dL (ref 8.9–10.3)
CO2: 27 mmol/L (ref 22–32)
Chloride: 107 mmol/L (ref 101–111)
Creatinine, Ser: 1.1 mg/dL (ref 0.61–1.24)
GFR calc Af Amer: >60 mL/min (ref >60)
Glucose, Bld: 108 mg/dL — ABNORMAL HIGH (ref 65–99)
Potassium: 3.6 mmol/L (ref 3.5–5.1)
SODIUM: 140 mmol/L (ref 135–145)

## 2016-12-30 MED ORDER — LISINOPRIL 5 MG PO TABS
5.0000 mg | ORAL_TABLET | Freq: Every day | ORAL | Status: DC
  Administered 2016-12-30: 12:00:00 5 mg via ORAL
  Filled 2016-12-30: qty 1

## 2016-12-30 MED ORDER — FUROSEMIDE 10 MG/ML IJ SOLN
40.0000 mg | Freq: Two times a day (BID) | INTRAMUSCULAR | Status: DC
  Administered 2016-12-30 (×2): 40 mg via INTRAVENOUS
  Filled 2016-12-30 (×2): qty 4

## 2016-12-30 MED ORDER — POTASSIUM CHLORIDE CRYS ER 10 MEQ PO TBCR
10.0000 meq | EXTENDED_RELEASE_TABLET | Freq: Two times a day (BID) | ORAL | Status: DC
  Administered 2016-12-30 (×2): 10 meq via ORAL
  Filled 2016-12-30 (×2): qty 1

## 2016-12-30 MED ORDER — ATORVASTATIN CALCIUM 40 MG PO TABS
40.0000 mg | ORAL_TABLET | Freq: Every day | ORAL | Status: DC
  Administered 2016-12-30: 40 mg via ORAL
  Filled 2016-12-30: qty 1

## 2016-12-30 NOTE — Progress Notes (Signed)
Progress Note  Patient Name: Joshua [name] Date of Encounter: 12/30/2016  Primary Cardiologist: New to Dr. Sallyanne Kuster  Subjective   Dyspnea resolved overnight s/p IV lasix. No chest pain.   Inpatient Medications    Scheduled Meds: . aspirin  81 mg Oral Daily  . atorvastatin  20 mg Oral q1800  . docusate sodium  100 mg Oral BID  . enoxaparin (LOVENOX) injection  40 mg Subcutaneous Q24H  . hydrALAZINE  25 mg Oral Q8H  . sodium chloride flush  3 mL Intravenous Q12H  . ticagrelor  90 mg Oral BID   Continuous Infusions: . sodium chloride     PRN Meds: sodium chloride, acetaminophen, hydrOXYzine, ondansetron (ZOFRAN) IV, sodium chloride, sodium chloride flush   Vital Signs    Vitals:   12/29/16 1845 12/29/16 2017 12/29/16 2127 12/30/16 0320  BP: (!) 162/100 140/80 136/81 138/88  Pulse:  81  70  Resp:  (!) 29  15  Temp:  97.7 F (36.5 C)  (!) 97.5 F (36.4 C)  TempSrc:  Oral  Oral  SpO2:  94%  96%  Weight:    238 lb 5.1 oz (108.1 kg)  Height:        Intake/Output Summary (Last 24 hours) at 12/30/16 0753 Last data filed at 12/30/16 0355  Gross per 24 hour  Intake              363 ml  Output             4475 ml  Net            -4112 ml   Filed Weights   12/28/16 0510 12/29/16 0600 12/30/16 0320  Weight: 240 lb 6.4 oz (109 kg) 237 lb 11.2 oz (107.8 kg) 238 lb 5.1 oz (108.1 kg)    Telemetry    Sinus rhythm with PACs, Bradycardia and NSVT x 1 - Personally Reviewed  ECG    Sinus rhythm at rate of 71 bpm with non specific ST changes - Personally Reviewed  Physical Exam   GEN: No acute distress.   Neck: No JVD Cardiac: RRR, no murmurs, rubs, or gallops. R radial cath site without hematoma Respiratory: diminished breath sound at base GI: Soft, nontender, non-distended  MS: No edema; No deformity. Neuro:  Nonfocal  Psych: Normal affect   Labs    Chemistry Recent Labs Lab 12/29/16 0334 12/29/16 1918 12/30/16 0241  NA 140 137 140  K 3.4* 3.4*  3.6  CL 108 106 107  CO2 24 23 27   GLUCOSE 101* 187* 108*  BUN 13 11 14   CREATININE 0.96 0.91 1.10  CALCIUM 9.1 9.3 9.3  GFRNONAA >60 >60 >60  GFRAA >60 >60 >60  ANIONGAP 8 8 6      Hematology Recent Labs Lab 12/28/16 0351 12/29/16 0334 12/30/16 0241  WBC 8.5 9.3 9.5  RBC 5.28 5.41 5.60  HGB 15.4 16.3 16.3  HCT 47.0 48.4 48.8  MCV 89.0 89.5 87.1  MCH 29.2 30.1 29.1  MCHC 32.8 33.7 33.4  RDW 13.6 14.2 13.6  PLT 176 152 174    Cardiac Enzymes Recent Labs Lab 12/26/16 2113 12/27/16 0238 12/27/16 0829  TROPONINI 0.03* 0.03* <0.03    Recent Labs Lab 12/26/16 1446  TROPIPOC 0.02     BNP Recent Labs Lab 12/26/16 2113 12/29/16 1918  BNP 1,551.0* 1,289.4*     Radiology    No results found.  Cardiac Studies   CORONARY STENT INTERVENTION  RIGHT/LEFT HEART CATH AND CORONARY  ANGIOGRAPHY  Conclusion     Prox RCA lesion, 25 %stenosed.  Dist RCA lesion, 25 %stenosed.  Dist Cx lesion, 60 %stenosed.  Prox Cx to Mid Cx lesion, 25 %stenosed.  There is moderate to severe left ventricular systolic dysfunction.  The left ventricular ejection fraction is 25-35% by visual estimate.  LV end diastolic pressure is moderately elevated.  There is no aortic valve stenosis.  Mid RCA lesion, 75 %stenosed. A STENT RESOLUTE ONYX 4.0X30 drug eluting stent was successfully placed.  Post intervention, there is a 0% residual stenosis.  Mid LAD lesion, 75 %stenosed. A STENT RESOLUTE ONYX [id number] drug eluting stent was successfully placed, post dilated to > 3 mm.  Post intervention, there is a 0% residual stenosis.  Hemodynamic findings consistent with mild pulmonary hypertension.  Ao sat 90%, PA sat 62%; CO 4.4 L/min, CI 2.1; mean PA pressure 34 mm Hg   Severe two vessel CAD which likely contributed to cardiomyopathy.  Aortic valve was easily crossed several times.  0.035" wire would cross aortic valve on its own during catheter exchanges.  Mean Ao valve gradient  9 mm Hg.  Ao valve area 1.94 cm2.    After discussion with Dr. Marlou Porch, we decided to proceed with PCI since aortic valve intervention was not required.     Diagnostic Diagram       Post-Intervention Diagram        Echo Study Conclusions  - Left ventricle: The cavity size was mildly dilated. Wall thickness was increased in a pattern of mild LVH. Systolic function was severely reduced. The estimated ejection fraction was in the range of 25% to 30%. Severe diffuse hypokinesis with no identifiable regional variations. Features are consistent with a pseudonormal left ventricular filling pattern, with concomitant abnormal relaxation and increased filling pressure (grade 2 diastolic dysfunction). - Aortic valve: A bicuspid morphology cannot be excluded; moderately thickened, moderately calcified leaflets. Valve mobility was restricted. Transvalvular velocity was increased less than expected, due to low cardiac output. There was severe stenosis. There was mild to moderate regurgitation directed centrally in the LVOT. Valve area (VTI): 0.93 cm^2. Valve area (Vmax): 1.1 cm^2. Valve area (Vmean): 1.01 cm^2. - Mitral valve: There was mild to moderate regurgitation directed centrally. - Left atrium: The atrium was severely dilated. - Right ventricle: The cavity size was moderately dilated. Systolic function was mildly reduced. - Right atrium: The atrium was mildly dilated.  Impressions:  - Low gradient severe aortic stenosis with severely reduced left ventricular systolic function.  Patient Profile     56 y.o. male with a hx of HTN, OA (on chronic ibuprofen/Tylenol), obesity, pre-diabetes, former light tobacco abuse and family history of CAD (CABG in father age 37s) who is being seen today for the evaluation of new onset CHF, EF 25-30% and severe aortic stenosisat the request of Dr. Lindell Noe.  Assessment & Plan    1. CAD - Cath showed 2 V  disease s/p DES to [facility] and mLAD. Continue ASA, Brillinta and statin.  2. HLD 12/27/2016: Cholesterol 133; HDL 29; LDL Cholesterol 86; Triglycerides 91; VLDL 18 - LDL goal less than 70. Increase lipitor to 40mg  qd. LFT and lipid panel in 4-6 weeks.   3. Acute combined CHF/ICM - Echo reveals low EF at 25-30%. Net I & O negative 13.2L. Weight loss 12lb (250-->238lb). Last night required IV lasix 80mg  for acute dyspnea. Now resolved. Close to euvolemic on exam however BNP of 1289. Elevated LVEDP by cath.  - No BB given bradycardia.  Continue hydralazine. Will add lisinopril 5mg  qd. Give IV lasix 40mg  BID for today. Change to po daily tomorrow.  Follow renal function closely.   4. Bradycardia - junctional vs type II. Will review with MD. Now off BB. Continue to Monitor.   5. Valvular disease with severe aortic stenosis, mild-moderate aortic insufficiency and mild-moderate mitral regurgitation on echo - Cath showed no AS. Mean Ao valve gradient 9 mm Hg.  Ao valve area 1.94 cm2.  6. HTN - now improved. Adding lisinopril as above.   For questions or updates, please contact [place] Please consult [email or web address] for contact info under Cardiology/STEMI.      Signed, Leanor Kail, PA  12/30/2016, 7:53 AM    Personally seen and examined. Agree with above.  EF 25%, AS does not look as severe on cath, more mild.  SOB, IV lasix 40 BID today Lungs with mild crackles at bases. 2/6 SM, RRR. Tele personally reviewed.  Off bb  Candee Furbish, MD

## 2016-12-30 NOTE — Progress Notes (Signed)
CARDIAC REHAB PHASE I   PRE:  Rate/Rhythm: 80 SR  BP:  Supine: 101/63  Sitting:   Standing:    SaO2: 93%RA  MODE:  Ambulation: 400 ft   POST:  Rate/Rhythm: 97 SR  BP:  Supine:   Sitting: 145/96  Standing:    SaO2: 98%RA 1350-1443 Pt walked 400 ft with steady gait. Hip limits distance. No CP. Sats good on RA. Education completed with pt and sister who is Therapist, sports. Stressed importance of brilinta with stent. Reviewed NTG use, signs/symptoms of CHF, 2000 mg sodium restriction, 2L FR and daily weights. Gave CHF booklet and low sodium diets along with heart healthy diet. Reviewed ex ed and benefits of CRP 2. Will refer to [facility] program.    Graylon Good, RN BSN  12/30/2016 2:39 PM

## 2016-12-30 NOTE — Discharge Summary (Signed)
[facility] Discharge Summary  Patient name: Joshua [name] Medical record number: [id number] Date of birth: December 03, 1960 Age: 56 y.o. Gender: male Date of Admission: 12/26/2016  Date of Discharge: 12/31/16 Admitting Physician: Alveda Reasons, MD  Primary Care Provider: Patient, No Pcp Per Consultants: Cardiology  Indication for Hospitalization: dyspnea  Discharge Diagnoses/Problem List:  New CHF, EF 25-30% S/p DES x 2  HTN  Disposition: home  Discharge Condition: improved  Discharge Exam: see progress note from day of discharge   Brief Hospital Course:  Mr. Timohty Renbarger, a 56 year old male, presented with gradually worsening dyspnea x 1 month, along with DOE and orthopnea.  In the ED he was hypertensive, CXR showed pulmonary edema, OMV>6720, 2/6 systolic RUSB mumur, lung crackles at bases, 2+ pitting edema in LE bilaterally. EKG was without acute ischemic changes, initial troponin negative, no fever or elevated WBC.He was started on IV Lasix 40mg  QD x 2 days with good diuretic response and improvement in dyspnea. Found to have new diastolic CHF with EF 94-70%. Initial question of severe aortic stenosis on echo, not found on cath. Cath performed finding significant stenosis of mid RCA and mid LAD with DES placed to each. For CAD, started on ASA, Brillinta, Lisinopril and Lasix. Coreg was initially started after cath, but patient had significant bradycardia (30s-40s with transient AV block). Cardiology recommended continued holding beta blocker at discharge.   Lipid Panel: Total cholesterol 133, HDL 29, LDL 86, Triglycerides 91. LDL goal <70. Repeat lipid panel in 4-6 weeks. Increased Atorvastatin.  Issues for Follow Up:  1. Cards follow up in 1 week for repeat Bmet and continued management. Hopeful for stunned myocardium with recovery of EF in future. 2. Continue to counsel on medication compliance as many meds were added to patient's regimen during  this admission.  3. Question OSA, may need sleep study as this could help with CHF outcomes.  4. Would avoid NSAIDs with OA of right hip in the setting of CAD.   Significant Procedures: cath as above  Significant Labs and Imaging:   Recent Labs Lab 12/28/16 0351 12/29/16 0334 12/30/16 0241  WBC 8.5 9.3 9.5  HGB 15.4 16.3 16.3  HCT 47.0 48.4 48.8  PLT 176 152 174    Recent Labs Lab 12/28/16 0351 12/28/16 1118 12/29/16 0334 12/29/16 1918 12/30/16 0241 12/31/16 0228  NA 142  --  140 137 140 140  K 3.7  --  3.4* 3.4* 3.6 3.5  CL 106  --  108 106 107 105  CO2 29  --  24 23 27 26   GLUCOSE 117*  --  101* 187* 108* 101*  BUN 13  --  13 11 14 17   CREATININE 1.28*  --  0.96 0.91 1.10 1.18  CALCIUM 9.0  --  9.1 9.3 9.3 9.2  MG  --  2.0 2.0  --   --   --    Dg Chest 2 View  Result Date: 12/26/2016 CLINICAL DATA:  Chest tightness. EXAM: CHEST  2 VIEW COMPARISON:  None. FINDINGS: Cardiomegaly with pulmonary vascular prominence and bilateral interstitial prominence with bilateral pleural effusions. No pneumothorax. No pneumothorax. No acute bony abnormality. IMPRESSION: Congestive heart failure with pulmonary interstitial edema and bilateral pleural effusions. Electronically Signed   By: Marcello Moores  Register   On: 12/26/2016 15:04    Results/Tests Pending at Time of Discharge: none  Discharge Medications:  Allergies as of 12/31/2016   No Known Allergies     Medication  List    TAKE these medications   aspirin 81 MG chewable tablet Chew 1 tablet (81 mg total) by mouth daily. Notes to patient:  Prevents clotting in stent and heart attack   atorvastatin 40 MG tablet Commonly known as:  LIPITOR Take 1 tablet (40 mg total) by mouth daily at 6 PM. Notes to patient:  Cholesterol    furosemide 40 MG tablet Commonly known as:  LASIX Take 1 tablet (40 mg total) by mouth daily. Notes to patient:  Fluid pill    hydrALAZINE 25 MG tablet Commonly known as:  APRESOLINE Take 1 tablet  (25 mg total) by mouth every 8 (eight) hours. Notes to patient:  Decreases blood pressure    lisinopril 10 MG tablet Commonly known as:  PRINIVIL,ZESTRIL Take 1 tablet (10 mg total) by mouth daily. Notes to patient:  Decreases blood pressure   potassium chloride 10 MEQ tablet Commonly known as:  K-DUR,KLOR-CON Take 1 tablet (10 mEq total) by mouth daily. Notes to patient:  Potassium supplement    ticagrelor 90 MG Tabs tablet Commonly known as:  BRILINTA Take 1 tablet (90 mg total) by mouth 2 (two) times daily. Notes to patient:  Prevents clotting in stent and heart attack            Discharge Care Instructions        Start     Ordered   12/31/16 0000  aspirin 81 MG chewable tablet  Daily     12/31/16 1205   12/31/16 0000  atorvastatin (LIPITOR) 40 MG tablet  Daily-1800     12/31/16 1205   12/31/16 0000  furosemide (LASIX) 40 MG tablet  Daily     12/31/16 1205   12/31/16 0000  hydrALAZINE (APRESOLINE) 25 MG tablet  Every 8 hours     12/31/16 1205   12/31/16 0000  lisinopril (PRINIVIL,ZESTRIL) 10 MG tablet  Daily     12/31/16 1205   12/31/16 0000  potassium chloride (K-DUR,KLOR-CON) 10 MEQ tablet  Daily     12/31/16 1205   12/31/16 0000  ticagrelor (BRILINTA) 90 MG TABS tablet  2 times daily     12/31/16 1205   12/31/16 0000  Increase activity slowly     12/31/16 1205   12/31/16 0000  Diet - low sodium heart healthy     12/31/16 1205   12/31/16 0000  Discharge instructions    Comments:  Follow up with your new doctor, [facility], within a week. Make an appointment to see Dr. Helene Shoe (cardiologist) at [facility] in 1 week as well. Call your cardiologist with weight changes as we discussed.   12/31/16 1205   12/31/16 0000  Call MD for:  difficulty breathing, headache or visual disturbances     12/31/16 1205   12/31/16 0000  (HEART FAILURE PATIENTS) Call MD:  Anytime you have any of the following symptoms: 1) 3 pound weight gain in 24 hours or 5  pounds in 1 week 2) shortness of breath, with or without a dry hacking cough 3) swelling in the hands, feet or stomach 4) if you have to sleep on extra pillows at night in order to breathe.     12/31/16 1205   12/30/16 0000  Amb Referral to Cardiac Rehabilitation    Question:  Diagnosis:  Answer:  Coronary Stents   12/30/16 1438      Discharge Instructions: Please refer to Patient Instructions section of EMR for full details.  Patient was counseled important signs  and symptoms that should prompt return to medical care, changes in medications, dietary instructions, activity restrictions, and follow up appointments.   Follow-Up Appointments: Follow-up [facility] Follow up on 01/08/2017.   Why:  10:30 for hospital follow up Contact information: [place] [address] [phone]       Barrett, Evelene Croon, [place]. Go on 01/15/2017.   Specialties:  Cardiology, Radiology Why:  @10 :00 am for hospital follow up  Contact information: [place] [address] [phone]           Sela Hilding, MD  01/02/2017, 9:46 AM

## 2016-12-30 NOTE — Progress Notes (Signed)
Family Medicine Medical Student Note Daily Progress Note Intern Pager: [phone]  Patient name: Joshua [name] Medical record number: [id number] Date of birth: 01-Oct-1960 Age: 56 y.o. Gender: male  Primary Care Provider: Patient, No Pcp Per Consultants: none  Code Status: full  Pt Overview and Major Events to Date:  9/14: Admitted with new CHF 9/15: Echo with new findings of EF 25-30%, diffuse hypokinesis, and severe AS  Assessment and Plan: JOHNMICHAEL [name] is a 56 year old male with history of hypertension,? OSA and osteoarthritis who presents with gradually worsening dyspnea for one month and found to have new onset CHF and severe aortic stenosis.   HFrEF with EF 25-30%: Etiology likely ischemic in the setting of stenosis of both mid RCA and mid LAD on cath 9/17. 250 lb> 238 lb on 09/18. Net I/O negative 13.2L. Acute dyspnea 9/17pm required Lasix 80mg . Cath did not show AS. -cardiology consulted, appreciate recs: Add lisinopril 5mg  qd, IV lasix 40mg  BID plan for to PO daily tomorrow -Strict I and O's and daily weights -am BMP -consider beta blocker when no longer bradycardic  CAD: Cath-2V severe CAD, Mid LAD 75% stenosis, Mid RCA 75% stenosis. S/p 2 DES. -Consinue ASA, brillinta and statin  HLD: Cholesterol 133, HDL 29, LDL 86, Triglycerides 91 -LDL goal <70 -Increase Atorvastatin to 40mg  -Follow up lipid panel in 4-6 weeks  Hypokalemia:  - Kdur 55mEq x1   Hypertension: BP 175/90 on admission, 147/103 on 9/17.  -Lasix as above -Hydralazine increase to 25mg  PO TID -Lisinopril as above  Bradycardia: isolated bradycardia post cath, resolved. Junctional vs Type II -No BB for now  OA of right hip: Chronic. Stable. -Tylenol as needed  FEN/GI: Heart healthy diet PPx: Lovenox  Disposition: Monitor s/p 2 DES  Subjective:  Joshua [name] was sitting on his bed when I walked in. Did not complain of any overnight events. No complaints this morning. He said he had  walked to the bathroom and has not been using the oxygen for the past half hour. He denies shortness of breath without the oxygen. Also mentioned that his stomach felt less pressure than yesterday and he is feeling much better overall.   Briefly on O2 overnight after cath, patient reports feeling much less SOB after lasix 9/17 pm. Understands he will need to go home with several more medicines than he came in on, and endorses wanting to be compliant with these. Sister is Therapist, sports, who helps him with his medical care.   Objective: Temp:  [97.5 F (36.4 C)-98.3 F (36.8 C)] 97.5 F (36.4 C) (09/18 0320) Pulse Rate:  [67-81] 70 (09/18 0320) Resp:  [12-29] 15 (09/18 0320) BP: (136-182)/(79-110) 138/88 (09/18 0320) SpO2:  [89 %-97 %] 96 % (09/18 0320) Weight:  [238 lb 5.1 oz (108.1 kg)] 238 lb 5.1 oz (108.1 kg) (09/18 0320) Physical Exam: General: Well appearing male, in no acute distress Cardiovascular: Regular rate and rhythm, S1 and S2, 2/6 systolic murmur over RUSB, no JVD, no peripheral edema, no hepatojugular reflex Respiratory: Normal work of breathing on room air, clear to auscultation bilaterally Abdomen: Soft, non-distended, Normal bowel sounds, no tenderness to palpation Extremities: No UE or LE edema, 2+ radial pulse and DP pulse bilaterally, warm to touch, capillary refill <2s  Laboratory:  Recent Labs Lab 12/28/16 0351 12/29/16 0334 12/30/16 0241  WBC 8.5 9.3 9.5  HGB 15.4 16.3 16.3  HCT 47.0 48.4 48.8  PLT 176 152 174    Recent Labs Lab 12/29/16 0334 12/29/16  1918 12/30/16 0241  NA 140 137 140  K 3.4* 3.4* 3.6  CL 108 106 107  CO2 24 23 27   BUN 13 11 14   CREATININE 0.96 0.91 1.10  CALCIUM 9.1 9.3 9.3  GLUCOSE 101* 187* 108*    Imaging/Diagnostic Tests: No results found. Echo with EF 25-30%, diffuse hypokinesis, and severe AS  Ophelia Charter, MS4  FPTS Upper-Level Resident Addendum  I have independently interviewed and examined the patient. I have discussed  the above with the original author and agree with their documentation. My edits for correction/addition/clarification are in blue. Please see also any attending notes.   Ralene Ok, MD PGY-2, [facility] Service pager: [phone] (text pages welcome through [facility])

## 2016-12-31 ENCOUNTER — Telehealth: Payer: Self-pay | Admitting: Physician Assistant

## 2016-12-31 LAB — BASIC METABOLIC PANEL
ANION GAP: 9 (ref 5–15)
BUN: 17 mg/dL (ref 6–20)
CO2: 26 mmol/L (ref 22–32)
Calcium: 9.2 mg/dL (ref 8.9–10.3)
Chloride: 105 mmol/L (ref 101–111)
Creatinine, Ser: 1.18 mg/dL (ref 0.61–1.24)
GFR calc Af Amer: >60 mL/min (ref >60)
GFR calc non Af Amer: >60 mL/min (ref >60)
GLUCOSE: 101 mg/dL — AB (ref 65–99)
Potassium: 3.5 mmol/L (ref 3.5–5.1)
Sodium: 140 mmol/L (ref 135–145)

## 2016-12-31 MED ORDER — ATORVASTATIN CALCIUM 40 MG PO TABS: 40.0000 mg | ORAL_TABLET | Freq: Every day | ORAL | 3 refills | Status: DC

## 2016-12-31 MED ORDER — POTASSIUM CHLORIDE CRYS ER 10 MEQ PO TBCR
10.0000 meq | EXTENDED_RELEASE_TABLET | Freq: Every day | ORAL | Status: DC
  Administered 2016-12-31: 10 meq via ORAL
  Filled 2016-12-31: qty 1

## 2016-12-31 MED ORDER — ASPIRIN 81 MG PO CHEW: 81.0000 mg | CHEWABLE_TABLET | Freq: Every day | ORAL | 3 refills | Status: DC

## 2016-12-31 MED ORDER — FUROSEMIDE 40 MG PO TABS
40.0000 mg | ORAL_TABLET | Freq: Every day | ORAL | Status: DC
  Filled 2016-12-31: qty 1

## 2016-12-31 MED ORDER — FUROSEMIDE 40 MG PO TABS: 40.0000 mg | ORAL_TABLET | Freq: Every day | ORAL | 3 refills | Status: DC

## 2016-12-31 MED ORDER — HYDRALAZINE HCL 25 MG PO TABS: 25.0000 mg | ORAL_TABLET | Freq: Three times a day (TID) | ORAL | 1 refills | Status: DC

## 2016-12-31 MED ORDER — LISINOPRIL 10 MG PO TABS: 10.0000 mg | ORAL_TABLET | Freq: Every day | ORAL | 1 refills | Status: DC

## 2016-12-31 MED ORDER — TICAGRELOR 90 MG PO TABS: 90.0000 mg | ORAL_TABLET | Freq: Two times a day (BID) | ORAL | 11 refills | Status: DC

## 2016-12-31 MED ORDER — POTASSIUM CHLORIDE CRYS ER 10 MEQ PO TBCR: 10.0000 meq | EXTENDED_RELEASE_TABLET | Freq: Every day | ORAL | 2 refills | Status: DC

## 2016-12-31 MED ORDER — LISINOPRIL 10 MG PO TABS
10.0000 mg | ORAL_TABLET | Freq: Every day | ORAL | Status: DC
  Administered 2016-12-31: 10 mg via ORAL
  Filled 2016-12-31: qty 1

## 2016-12-31 MED FILL — LISINOPRIL 10 MG TABS: 10 | 30 days supply | Qty: 30 | Fill #0

## 2016-12-31 MED FILL — FUROSEMIDE 40 MG TABLET: 40 | 30 days supply | Qty: 30 | Fill #0

## 2016-12-31 MED FILL — hydrALAZINE HCL 25 MG TABS: 25 | 30 days supply | Qty: 90 | Fill #0

## 2016-12-31 MED FILL — BRILINTA 90 MG TABLET: 90 | 30 days supply | Qty: 60 | Fill #0

## 2016-12-31 MED FILL — ?POTASSIUM CL ER 10MEQ TAB: 10 | 30 days supply | Qty: 30 | Fill #0

## 2016-12-31 NOTE — Telephone Encounter (Signed)
Received incoming records from [facility] for upcoming appointment on 01/15/17 @ 10am with Rosaria Ferries. Records given to [facility] in Medical Records. 12/31/16 ab

## 2016-12-31 NOTE — Progress Notes (Signed)
Family Medicine Medical Student Note Daily Progress Note Intern Pager: [phone]  Patient name: Joshua [name] [facility] Medical record number: [id number] Date of birth: 11-07-1960 Age: 56 y.o. Gender: male  Primary Care Provider: Patient, No Pcp Per Consultants: none  Code Status: full  Pt Overview and Major Events to Date:  9/14: Admitted with new CHF 9/15: Echo with new findings of EF 25-30%, diffuse hypokinesis, and severe AS 9/17: Cath showed mid LAD and mid RCA stenosis 75%. S/p 2 DES.  Assessment and Plan: Joshua [name] is a 56 year old male with history of hypertension,? OSA and osteoarthritis who presents with gradually worsening dyspnea for one month and found to have new onset CHF and severe aortic stenosis.   HFrEF with EF 25-30%: Etiology likely ischemic in the setting of stenosis of both mid RCA and mid LAD on cath 9/17. 250 lb> 227 lb on 09/19. Cath did not show AS. -cardiology consulted, appreciate recs: increase lisinopril to 10mg  qd, PO lasix 40mg  QD with 54meq Kdur -Strict I and O's and daily weights -AM BMP -no BB 2/2 bradycardia event -Cardiac rehab  CAD: Cath-2V severe CAD, Mid LAD 75% stenosis, Mid RCA 75% stenosis. S/p 2 DES. -Consinue ASA, brillinta and statin  HLD: Cholesterol 133, HDL 29, LDL 86, Triglycerides 91 -LDL goal <70 -Continue Atorvastatin -Follow up lipid panel in 4-6 weeks  Hypokalemia:  - Kdur 67mEq daily  Hypertension: BP 175/90 on admission, 147/103 on 9/17.  -Lasix as above -Continue Hydralazine -Lisinopril as above  Bradycardia, resolved: isolated bradycardia post cath.. -no BB going forward 2/2 bradycardia  OA of right hip: Chronic. Stable. -Tylenol as needed  FEN/GI: Heart healthy diet PPx: Lovenox  Disposition:  Home per cards  Subjective:  Joshua [name] did not have any acute overnight events. No complaints this morning. Says he feels better today.  Objective: Temp:  [97.7 F (36.5 C)-98.4 F (36.9 C)] 97.7 F  (36.5 C) (09/19 0646) Pulse Rate:  [74-86] 77 (09/19 0646) Resp:  [14-25] 17 (09/19 0646) BP: (132-158)/(85-96) 147/92 (09/19 0646) SpO2:  [97 %-98 %] 97 % (09/19 0646) Weight:  [227 lb 8.2 oz (103.2 kg)] 227 lb 8.2 oz (103.2 kg) (09/19 8101) Physical Exam: General: Well appearing male, in no acute distress Cardiovascular: Regular rate and rhythm, S1 and S2, 2/6 systolic murmur over RUSB, no JVD, no peripheral edema, no hepatojugular reflex Respiratory: Normal work of breathing on room air, clear to auscultation bilaterally Abdomen: Soft, non-distended, Normal bowel sounds, no tenderness to palpation Extremities: No UE or LE edema, 2+ radial pulse and DP pulse bilaterally, warm to touch, capillary refill <2s  Laboratory:  Recent Labs Lab 12/28/16 0351 12/29/16 0334 12/30/16 0241  WBC 8.5 9.3 9.5  HGB 15.4 16.3 16.3  HCT 47.0 48.4 48.8  PLT 176 152 174    Recent Labs Lab 12/29/16 1918 12/30/16 0241 12/31/16 0228  NA 137 140 140  K 3.4* 3.6 3.5  CL 106 107 105  CO2 23 27 26   BUN 11 14 17   CREATININE 0.91 1.10 1.18  CALCIUM 9.3 9.3 9.2  GLUCOSE 187* 108* 101*    Imaging/Diagnostic Tests: Echo with EF 25-30%, diffuse hypokinesis, and severe AS  Ophelia Charter, MS4   FPTS Upper-Level Resident Addendum  I have independently interviewed and examined the patient. I have discussed the above with the original author and agree with their documentation. My edits for correction/addition/clarification are in blue. Please see also any attending notes.   Ralene Ok, MD PGY-2, [facility] Service pager: [phone] (text pages welcome through [facility])

## 2016-12-31 NOTE — Progress Notes (Signed)
Progress Note  Patient Name: Joshua [name] Date of Encounter: 12/31/2016  Primary Cardiologist: Dr. Sallyanne Kuster ([place])  Subjective   Feeling well. No chest pain, sob or palpitations.   Inpatient Medications    Scheduled Meds: . aspirin  81 mg Oral Daily  . atorvastatin  40 mg Oral q1800  . docusate sodium  100 mg Oral BID  . enoxaparin (LOVENOX) injection  40 mg Subcutaneous Q24H  . furosemide  40 mg Intravenous BID  . hydrALAZINE  25 mg Oral Q8H  . lisinopril  5 mg Oral Daily  . potassium chloride  10 mEq Oral BID  . sodium chloride flush  3 mL Intravenous Q12H  . ticagrelor  90 mg Oral BID   Continuous Infusions: . sodium chloride     PRN Meds: sodium chloride, acetaminophen, hydrOXYzine, ondansetron (ZOFRAN) IV, sodium chloride, sodium chloride flush   Vital Signs    Vitals:   12/30/16 2000 12/31/16 0646 12/31/16 0700 12/31/16 0815  BP:  (!) 147/92 (!) 160/103 (!) 144/84  Pulse:  77 79   Resp: 14 17 15    Temp:  97.7 F (36.5 C) (!) 97.1 F (36.2 C)   TempSrc:  Oral Oral   SpO2:  97% 96%   Weight:  227 lb 8.2 oz (103.2 kg)    Height:        Intake/Output Summary (Last 24 hours) at 12/31/16 0916 Last data filed at 12/31/16 0700  Gross per 24 hour  Intake             1320 ml  Output             4225 ml  Net            -2905 ml   Filed Weights   12/29/16 0600 12/30/16 0320 12/31/16 0646  Weight: 237 lb 11.2 oz (107.8 kg) 238 lb 5.1 oz (108.1 kg) 227 lb 8.2 oz (103.2 kg)    Telemetry    Sr with PVCs and NSVT- Personally Reviewed  ECG    N/A - Personally Reviewed  Physical Exam   GEN: No acute distress.   Neck: No JVD Cardiac: RRR, no murmurs, rubs, or gallops. R radial cath site without hematoma Respiratory: Clear to auscultation bilaterally. GI: Soft, nontender, non-distended  MS: No edema; No deformity. Neuro:  Nonfocal  Psych: Normal affect   Labs    Chemistry Recent Labs Lab 12/29/16 1918 12/30/16 0241 12/31/16 0228  NA 137  140 140  K 3.4* 3.6 3.5  CL 106 107 105  CO2 23 27 26   GLUCOSE 187* 108* 101*  BUN 11 14 17   CREATININE 0.91 1.10 1.18  CALCIUM 9.3 9.3 9.2  GFRNONAA >60 >60 >60  GFRAA >60 >60 >60  ANIONGAP 8 6 9      Hematology Recent Labs Lab 12/28/16 0351 12/29/16 0334 12/30/16 0241  WBC 8.5 9.3 9.5  RBC 5.28 5.41 5.60  HGB 15.4 16.3 16.3  HCT 47.0 48.4 48.8  MCV 89.0 89.5 87.1  MCH 29.2 30.1 29.1  MCHC 32.8 33.7 33.4  RDW 13.6 14.2 13.6  PLT 176 152 174    Cardiac Enzymes Recent Labs Lab 12/26/16 2113 12/27/16 0238 12/27/16 0829  TROPONINI 0.03* 0.03* <0.03    Recent Labs Lab 12/26/16 1446  TROPIPOC 0.02     BNP Recent Labs Lab 12/26/16 2113 12/29/16 1918  BNP 1,551.0* 1,289.4*     DDimer No results for input(s): DDIMER in the last 168 hours.   Radiology  No results found.  Cardiac Studies   CORONARY STENT INTERVENTION  RIGHT/LEFT HEART CATH AND CORONARY ANGIOGRAPHY  Conclusion     Prox RCA lesion, 25 %stenosed.  Dist RCA lesion, 25 %stenosed.  Dist Cx lesion, 60 %stenosed.  Prox Cx to Mid Cx lesion, 25 %stenosed.  There is moderate to severe left ventricular systolic dysfunction.  The left ventricular ejection fraction is 25-35% by visual estimate.  LV end diastolic pressure is moderately elevated.  There is no aortic valve stenosis.  Mid RCA lesion, 75 %stenosed. A STENT RESOLUTE ONYX 4.0X30 drug eluting stent was successfully placed.  Post intervention, there is a 0% residual stenosis.  Mid LAD lesion, 75 %stenosed. A STENT RESOLUTE ONYX [id number] drug eluting stent was successfully placed, post dilated to > 3 mm.  Post intervention, there is a 0% residual stenosis.  Hemodynamic findings consistent with mild pulmonary hypertension.  Ao sat 90%, PA sat 62%; CO 4.4 L/min, CI 2.1; mean PA pressure 34 mm Hg  Severe two vessel CAD which likely contributed to cardiomyopathy.  Aortic valve was easily crossed several times. 0.035"  wire would cross aortic valve on its own during catheter exchanges. Mean Ao valve gradient 9 mm Hg. Ao valve area 1.94 cm2.   After discussion with Dr. Marlou Porch, we decided to proceed with PCI since aortic valve intervention was not required.    Diagnostic Diagram       Post-Intervention Diagram        Echo Study Conclusions  - Left ventricle: The cavity size was mildly dilated. Wall thickness was increased in a pattern of mild LVH. Systolic function was severely reduced. The estimated ejection fraction was in the range of 25% to 30%. Severe diffuse hypokinesis with no identifiable regional variations. Features are consistent with a pseudonormal left ventricular filling pattern, with concomitant abnormal relaxation and increased filling pressure (grade 2 diastolic dysfunction). - Aortic valve: A bicuspid morphology cannot be excluded; moderately thickened, moderately calcified leaflets. Valve mobility was restricted. Transvalvular velocity was increased less than expected, due to low cardiac output. There was severe stenosis. There was mild to moderate regurgitation directed centrally in the LVOT. Valve area (VTI): 0.93 cm^2. Valve area (Vmax): 1.1 cm^2. Valve area (Vmean): 1.01 cm^2. - Mitral valve: There was mild to moderate regurgitation directed centrally. - Left atrium: The atrium was severely dilated. - Right ventricle: The cavity size was moderately dilated. Systolic function was mildly reduced. - Right atrium: The atrium was mildly dilated.  Impressions:  - Low gradient severe aortic stenosis with severely reduced left ventricular systolic function.  Patient Profile     56 y.o. male with a hx of HTN, OA (on chronic ibuprofen/Tylenol), obesity, pre-diabetes, former light tobacco abuse and family history of CAD (CABG in father age 65s) who is being seen today for the evaluation of new onset CHF, EF 25-30%and severe aortic  stenosisat the request of Dr. Lindell Noe.  Assessment & Plan    1. CAD - Cath showed 2 V disease s/p DES to [facility] and mLAD. Ambulating well. No chest pain. CRP II. Continue ASA, Brillinta and statin.  2. HLD 12/27/2016: Cholesterol 133; HDL 29; LDL Cholesterol 86; Triglycerides 91; VLDL 18 - LDL goal less than 70. Increase lipitor to 40mg  qd. LFT and lipid panel in 4-6 weeks.   3. Acute combined CHF/ICM - Echo reveals low EF at 25-30%.BNP of 1289. Elevated LVEDP by cath.  Given IV lasix 40mg  BID yesterday with ~3L output/ Net I & O negative 16L. Weight  loss 23lb (250-->227lb).  - No BB given bradycardia. Continue hydralazine and  lisinopril. Discharge on lasix 40mg  qd with Kdur of 76meq daily. Repeat BMET in 1 week.  4. Bradycardia - junctional vs type II. Marland Kitchen Now off BB. No further reoccurance.   5. Valvular disease with severe aortic stenosis, mild-moderate aortic insufficiency and mild-moderate mitral regurgitation on echo - Cath showed no AS. Mean Ao valve gradient 9 mm Hg. Ao valve area 1.94 cm2.  6. HTN - Minimally elevated. Increase lisinopril to 10mg  qd. Continue hydralazine 25mg  TID.   Dr. Marlou Porch to see .    For questions or updates, please contact [place] Please consult [email or web address] for contact info under Cardiology/STEMI.      Signed, Leanor Kail, PA  12/31/2016, 9:16 AM    Personally seen and examined. Agree with above.  No CP, no SOB. Ambulating halls  Ischemic cardiomyopathy, acute systolic HF  - may be in part from HTN as well as CAD  - Holding on Bb for now with bradycardia. Try to start as outpatient Increased lisinopril to 10 Lasix  Aortic stenosis  - more mild than expected on cath. Agree with Dr. Andria Frames that ECHO is usually the gold standard for AS. Unusual.   CTAB, AAO, RRR, no edema   Ok to dc  Will need cardiology follow up in 1-2 weeks. Dr. Sallyanne Kuster or APP at [facility] clinic.   Candee Furbish, MD

## 2017-01-08 ENCOUNTER — Inpatient Hospital Stay: Payer: Self-pay

## 2017-01-15 ENCOUNTER — Encounter: Payer: Self-pay | Admitting: Physician Assistant

## 2017-01-15 ENCOUNTER — Telehealth: Payer: Self-pay | Admitting: Physician Assistant

## 2017-01-15 ENCOUNTER — Ambulatory Visit (INDEPENDENT_AMBULATORY_CARE_PROVIDER_SITE_OTHER): Payer: Medicaid Other | Admitting: Physician Assistant

## 2017-01-15 VITALS — BP 102/70 | HR 67 | Ht 73.0 in | Wt 234.0 lb

## 2017-01-15 DIAGNOSIS — I255 Ischemic cardiomyopathy: Secondary | ICD-10-CM | POA: Diagnosis not present

## 2017-01-15 DIAGNOSIS — I5022 Chronic systolic (congestive) heart failure: Secondary | ICD-10-CM

## 2017-01-15 DIAGNOSIS — E785 Hyperlipidemia, unspecified: Secondary | ICD-10-CM | POA: Diagnosis not present

## 2017-01-15 DIAGNOSIS — I2510 Atherosclerotic heart disease of native coronary artery without angina pectoris: Secondary | ICD-10-CM | POA: Diagnosis not present

## 2017-01-15 LAB — COMPREHENSIVE METABOLIC PANEL
A/G RATIO: 1.6 (ref 1.2–2.2)
ALBUMIN: 4.3 g/dL (ref 3.5–5.5)
ALT: 28 IU/L (ref 0–44)
AST: 23 IU/L (ref 0–40)
Alkaline Phosphatase: 110 IU/L (ref 39–117)
BUN / CREAT RATIO: 17 (ref 9–20)
BUN: 18 mg/dL (ref 6–24)
Bilirubin Total: 0.3 mg/dL (ref 0.0–1.2)
CALCIUM: 9.8 mg/dL (ref 8.7–10.2)
CO2: 25 mmol/L (ref 20–29)
Chloride: 97 mmol/L (ref 96–106)
Creatinine, Ser: 1.07 mg/dL (ref 0.76–1.27)
GFR, EST AFRICAN AMERICAN: 88 mL/min/{1.73_m2} (ref >59)
GFR, EST NON AFRICAN AMERICAN: 76 mL/min/{1.73_m2} (ref >59)
GLOBULIN, TOTAL: 2.7 g/dL (ref 1.5–4.5)
Glucose: 95 mg/dL (ref 65–99)
Potassium: 4.1 mmol/L (ref 3.5–5.2)
Sodium: 139 mmol/L (ref 134–144)
TOTAL PROTEIN: 7 g/dL (ref 6.0–8.5)

## 2017-01-15 LAB — LIPID PANEL
CHOLESTEROL TOTAL: 94 mg/dL — AB (ref 100–199)
Chol/HDL Ratio: 2.5 ratio (ref 0.0–5.0)
HDL: 38 mg/dL — ABNORMAL LOW (ref >39)
LDL Calculated: 30 mg/dL (ref 0–99)
TRIGLYCERIDES: 131 mg/dL (ref 0–149)
VLDL Cholesterol Cal: 26 mg/dL (ref 5–40)

## 2017-01-15 MED ORDER — HYDRALAZINE HCL 25 MG PO TABS: 12.5000 mg | ORAL_TABLET | Freq: Three times a day (TID) | ORAL | 0 refills | Status: DC

## 2017-01-15 MED ORDER — LISINOPRIL 10 MG PO TABS: 5.0000 mg | ORAL_TABLET | Freq: Every day | ORAL | 3 refills | Status: DC

## 2017-01-15 NOTE — Telephone Encounter (Signed)
Spoke with Cristopher Peru D, he wanted to clarify Rx sent today on Lisinopril was as directed. Spoke with Donnie Aho PA who saw patient today and that is correct. Advised Lurena Joiner

## 2017-01-15 NOTE — Progress Notes (Signed)
Cardiology Office Note   Date:  01/15/2017   ID:  Joshua [name], DOB 1961-01-28, MRN [id number]  PCP: [facility] Cardiologist:  Dr. Sallyanne Kuster, 12/28/2016 in hospital  Teddie Mehta, [place]anne [place], [place]    History of Present Illness: Joshua [name] is a 56 y.o. male with a history of HTN, OA (on chronic ibuprofen/Tylenol), obesity, pre-diabetes, former light tobacco abuse and family history of CAD (CABG in father age 4s)   Admitted 09/14-09/19/2018 for CHF with EF 25-30 percent, CAD, and ?severe AS>not found. Cath w/ DES LAD & RCA, bradycardia on Coreg>>no BB, ?OSA   Joshua [name] presents for cardiology follow up.   He has done well in many ways since discharge. He has not had chest pain. He has not had SOB. He has had stomach burning in the past from ETOH, but has only an occasional mixed drink now.   He has been weighing at home, his weight was 220 after discharge, today he was 223 on home scales. He is weighing daily. He has not had any gains of 3 pounds in a day or 5 pounds in a week.  He is walking at [facility]. He uses a cart because of his hip, but that is working. He was able to walk 25 minutes yesterday, then used a riding mower and then worked in the garden. After that, he was extremely tired.   He wants to know if he can lift weights, wants to do more than 10 lbs.   He is struggling with hip pain because he is now off all of his NSAIDs. Tylenol was not much help.  He has a dental appointment at the [facility] clinic in [place], this is for filling. However, he states he knows he has some teeth that need to be pulled. He wonders when he can have this done.   Past Medical History:  Diagnosis Date  . CAD (coronary artery disease), native coronary artery 12/26/2016   DES LAD & RCA, EF 25-30%  . Hypertension   . Obesity   . Osteoarthritis   . Pre-diabetes    a. prior A1C 5.6, states he was on meds for this at one point.    Past Surgical  History:  Procedure Laterality Date  . CORONARY STENT INTERVENTION N/A 12/29/2016   Procedure: CORONARY STENT INTERVENTION;  Surgeon: Jettie Booze, MD;  Location: [place] [facility];  Service: Cardiovascular;  Laterality: N/A;  . RIGHT/LEFT HEART CATH AND CORONARY ANGIOGRAPHY N/A 12/29/2016   Procedure: RIGHT/LEFT HEART CATH AND CORONARY ANGIOGRAPHY;  Surgeon: Jettie Booze, MD;  Location: [place] [facility];  Service: Cardiovascular;  Laterality: N/A;    Current Outpatient Prescriptions  Medication Sig Dispense Refill  . aspirin 81 MG chewable tablet Chew 1 tablet (81 mg total) by mouth daily. 90 tablet 3  . atorvastatin (LIPITOR) 40 MG tablet Take 1 tablet (40 mg total) by mouth daily at 6 PM. 90 tablet 3  . furosemide (LASIX) 40 MG tablet Take 1 tablet (40 mg total) by mouth daily. 30 tablet 3  . hydrALAZINE (APRESOLINE) 25 MG tablet Take 12.5 mg by mouth 3 (three) times daily.    Marland Kitchen lisinopril (PRINIVIL,ZESTRIL) 10 MG tablet Take 5 mg by mouth daily.    . potassium chloride (K-DUR,KLOR-CON) 10 MEQ tablet Take 1 tablet (10 mEq total) by mouth daily. 30 tablet 2  . ticagrelor (BRILINTA) 90 MG TABS tablet Take 1 tablet (90 mg total) by mouth 2 (two) times daily.  60 tablet 11   No current facility-administered medications for this visit.     Allergies:   Patient has no known allergies.    Social History:  The patient  reports that he has quit smoking. He has never used smokeless tobacco. He reports that he drinks alcohol. He reports that he uses drugs, including Marijuana.   Family History:  The patient's family history includes CAD in his father.    ROS:  Please see the history of present illness. All other systems are reviewed and negative.    PHYSICAL EXAM: VS:  BP 102/70   Pulse 67   Ht 6\' 1"  (1.854 m)   Wt 234 lb (106.1 kg)   SpO2 97%   BMI 30.87 kg/m  , BMI Body mass index is 30.87 kg/m. GEN: Well nourished, well developed, male in no acute distress  HEENT:  normal for age  Neck: no JVD, no carotid bruit, no masses Cardiac: RRR; 3/6 murmur, no rubs, or gallops Respiratory:  Scattered dry rales but good air exchange bilaterally, normal work of breathing GI: soft, nontender, nondistended, + BS MS: no deformity or atrophy; no edema; distal pulses are 2+ in all 4 extremities   Skin: warm and dry, no rash Neuro:  Strength and sensation are intact Psych: euthymic mood, full affect   EKG:  EKG is ordered today. The ekg ordered today demonstrates SR, HR 68. ?LVH, no acute changes.   ECHO: 12/27/2016 - Left ventricle: The cavity size was mildly dilated. Wall   thickness was increased in a pattern of mild LVH. Systolic   function was severely reduced. The estimated ejection fraction   was in the range of 25% to 30%. Severe diffuse hypokinesis with   no identifiable regional variations. Features are consistent with   a pseudonormal left ventricular filling pattern, with concomitant   abnormal relaxation and increased filling pressure (grade 2   diastolic dysfunction). - Aortic valve: A bicuspid morphology cannot be excluded;   moderately thickened, moderately calcified leaflets. Valve   mobility was restricted. Transvalvular velocity was increased   less than expected, due to low cardiac output. There was severe   stenosis. There was mild to moderate regurgitation directed   centrally in the LVOT. Valve area (VTI): 0.93 cm^2. Valve area   (Vmax): 1.1 cm^2. Valve area (Vmean): 1.01 cm^2. - Mitral valve: There was mild to moderate regurgitation directed centrally. - Left atrium: The atrium was severely dilated. - Right ventricle: The cavity size was moderately dilated. Systolic   function was mildly reduced. - Right atrium: The atrium was mildly dilated. Impressions: - Low gradient severe aortic stenosis with severely reduced left   ventricular systolic function.  CATH: 12/29/2016  Prox RCA lesion, 25 %stenosed.  Dist RCA lesion, 25  %stenosed.  Dist Cx lesion, 60 %stenosed.  Prox Cx to Mid Cx lesion, 25 %stenosed.  There is moderate to severe left ventricular systolic dysfunction.  The left ventricular ejection fraction is 25-35% by visual estimate.  LV end diastolic pressure is moderately elevated.  There is no aortic valve stenosis.  Mid RCA lesion, 75 %stenosed. A STENT RESOLUTE ONYX 4.0X30 drug eluting stent was successfully placed.  Post intervention, there is a 0% residual stenosis.  Mid LAD lesion, 75 %stenosed. A STENT RESOLUTE ONYX [id number] drug eluting stent was successfully placed, post dilated to > 3 mm.  Post intervention, there is a 0% residual stenosis.  Hemodynamic findings consistent with mild pulmonary hypertension.  Ao sat 90%, PA sat 62%;  CO 4.4 L/min, CI 2.1; mean PA pressure 34 mm Hg  Severe two vessel CAD which likely contributed to cardiomyopathy. Aortic valve was easily crossed several times.  0.035" wire would cross aortic valve on its own during catheter exchanges.  Mean Ao valve gradient 9 mm Hg.  Ao valve area 1.94 cm2.   Diagnostic Diagram      Recent Labs: 12/27/2016: TSH 2.493 12/29/2016: B Natriuretic Peptide 1,289.4; Magnesium 2.0 12/30/2016: Hemoglobin 16.3; Platelets 174 12/31/2016: BUN 17; Creatinine, Ser 1.18; Potassium 3.5; Sodium 140    Lipid Panel    Component Value Date/Time   CHOL 133 12/27/2016 0238   TRIG 91 12/27/2016 0238   HDL 29 (L) 12/27/2016 0238   CHOLHDL 4.6 12/27/2016 0238   VLDL 18 12/27/2016 0238   LDLCALC 86 12/27/2016 0238     Wt Readings from Last 3 Encounters:  01/15/17 234 lb (106.1 kg)  12/31/16 227 lb 8.2 oz (103.2 kg)     Other studies Reviewed: Additional studies/ records that were reviewed today include: Hospital records and testing.  ASSESSMENT AND PLAN:  1.  CAD: His symptoms have greatly improved since discharge from the hospital. He is compliant with his medications including Brilinta and aspirin. He understands that  because of the Brilinta and aspirin, he cannot take NSAIDs. He was also advised that right now, he cannot get his teeth pulled unless they are willing to do it while he is on the aspirin and Brilinta. Continue ASA, Brilinta, Lasix. No beta blocker because of her tachycardia. Continue ACE inhibitor.   He was contacted by cardiac rehabilitation, but cannot afford it. His family is working on getting him Medicaid, if he gets it and will be able to afford it, he is willing to do i Dr. Nelwyn Salisbury.  2. Hypertension: He decreased his hydralazine 25 mg 3 times a day 2 half tablet 3 times a day and the lisinopril from 10 mg daily down to 5 mg daily. His systolic blood pressure today in the office is right at 100. He is encouraged to continue the medications at these doses and let us know if he has side effects.  3. Chronic systolic CHF: His volume status is good. His weight is up today, but he has eaten today and has keys and the phone and other things in his pockets. On his home scales, his weight is not very much. He is having no symptoms of heart failure including dyspnea on exertion, orthopnea or PND. He is to continue daily weights and a low-sodium diet and monitor that. Since the Lasix and potassium were new medications for him, I'll check a BMET today.  4. Ischemic cardiomyopathy. He is not on a beta blocker because of bradycardia. He is on a low dose of an ACE inhibitor and hydralazine. His blood pressure will not tolerate the addition of any other medications or up titration of these. Recheck an echo when he follows up with Dr. Sallyanne Kuster. If his EF does not improve, he may qualify for an ICD.  5. Dyslipidemia, goal LDL less than 70: He is compliant with his Lipitor and is eating a more healthy diet. Recheck in 3 months when he follows up with Dr. Sallyanne Kuster  Current medicines are reviewed at length with the patient today.  The patient has concerns regarding medicines. Concerns were addressed  The following  changes have been made:  Continue meds at current doses  Labs/ tests ordered today include:   Orders Placed This Encounter  Procedures  .  Lipid panel  . Comprehensive metabolic panel  . EKG 12-Lead  . ECHOCARDIOGRAM COMPLETE     Disposition:   FU with Dr. Sallyanne Kuster  Signed, Rosaria Ferries, PA-C  01/15/2017 1:30 PM    [place] Phone: [phone]; Fax: [phone]  This note was written with the assistance of speech recognition software. Please excuse any transcriptional errors.

## 2017-01-15 NOTE — Patient Instructions (Signed)
Medication Instructions:  Change hydralazine 1/2 - 1 tablet every 8 hours as directed Change Lisinopril 5-10mg  daily as directed If you need a refill on your cardiac medications before your next appointment, please call your pharmacy.  Labwork: CMP AND FLP TODAY HERE IN OUR OFFICE AT [facility]  Testing/Procedures: Your physician has requested that you have an echocardiogram. Echocardiography is a painless test that uses sound waves to create images of your heart. It provides your doctor with information about the size and shape of your heart and how well your heart's chambers and valves are working. This procedure takes approximately one hour. There are no restrictions for this procedure.  Follow-Up: Your physician wants you to follow-up in: 3 MONTHS WITH DR Sallyanne Kuster AFTER ECHO.   Special Instructions: TAKE WEIGHT DAILY 1ST THING IN THE MORNING  CALL CARDIAC REHAB ON Monday IF THEY HAVE NOT CALLED YOU [phone]  OK TO INCREASE ACTIVITIES AS TOLERATED NO MORE THAN 5/10  FOLLOW LOW SODIUM/DIABETIC DIET  Thank you for choosing [facility] at [facility]!!     Low-Sodium Eating Plan Sodium, which is an element that makes up salt, helps you maintain a healthy balance of fluids in your body. Too much sodium can increase your blood pressure and cause fluid and waste to be held in your body. Your health care provider or dietitian may recommend following this plan if you have high blood pressure (hypertension), kidney disease, liver disease, or heart failure. Eating less sodium can help lower your blood pressure, reduce swelling, and protect your heart, liver, and kidneys. What are tips for following this plan? General guidelines  Most people on this plan should limit their sodium intake to 1,500-2,000 mg (milligrams) of sodium each day. Reading food labels  The Nutrition Facts label lists the amount of sodium in one serving of the food. If you eat more than one serving, you must  multiply the listed amount of sodium by the number of servings.  Choose foods with less than 140 mg of sodium per serving.  Avoid foods with 300 mg of sodium or more per serving. Shopping  Look for lower-sodium products, often labeled as "low-sodium" or "no salt added."  Always check the sodium content even if foods are labeled as "unsalted" or "no salt added".  Buy fresh foods. ? Avoid canned foods and premade or frozen meals. ? Avoid canned, cured, or processed meats  Buy breads that have less than 80 mg of sodium per slice. Cooking  Eat more home-cooked food and less restaurant, buffet, and fast food.  Avoid adding salt when cooking. Use salt-free seasonings or herbs instead of table salt or sea salt. Check with your health care provider or pharmacist before using salt substitutes.  Cook with plant-based oils, such as canola, sunflower, or olive oil. Meal planning  When eating at a restaurant, ask that your food be prepared with less salt or no salt, if possible.  Avoid foods that contain MSG (monosodium glutamate). MSG is sometimes added to [place] food, bouillon, and some canned foods. What foods are recommended? The items listed may not be a complete list. Talk with your dietitian about what dietary choices are best for you. Grains Low-sodium cereals, including oats, puffed wheat and rice, and shredded wheat. Low-sodium crackers. Unsalted rice. Unsalted pasta. Low-sodium bread. Whole-grain breads and whole-grain pasta. Vegetables Fresh or frozen vegetables. "No salt added" canned vegetables. "No salt added" tomato sauce and paste. Low-sodium or reduced-sodium tomato and vegetable juice. Fruits Fresh, frozen, or canned fruit.  Fruit juice. Meats and other protein foods Fresh or frozen (no salt added) meat, poultry, seafood, and fish. Low-sodium canned tuna and salmon. Unsalted nuts. Dried peas, beans, and lentils without added salt. Unsalted canned beans. Eggs. Unsalted nut  butters. Dairy Milk. Soy milk. Cheese that is naturally low in sodium, such as ricotta cheese, fresh mozzarella, or Swiss cheese Low-sodium or reduced-sodium cheese. Cream cheese. Yogurt. Fats and oils Unsalted butter. Unsalted margarine with no trans fat. Vegetable oils such as canola or olive oils. Seasonings and other foods Fresh and dried herbs and spices. Salt-free seasonings. Low-sodium mustard and ketchup. Sodium-free salad dressing. Sodium-free light mayonnaise. Fresh or refrigerated horseradish. Lemon juice. Vinegar. Homemade, reduced-sodium, or low-sodium soups. Unsalted popcorn and pretzels. Low-salt or salt-free chips. What foods are not recommended? The items listed may not be a complete list. Talk with your dietitian about what dietary choices are best for you. Grains Instant hot cereals. Bread stuffing, pancake, and biscuit mixes. Croutons. Seasoned rice or pasta mixes. Noodle soup cups. Boxed or frozen macaroni and cheese. Regular salted crackers. Self-rising flour. Vegetables Sauerkraut, pickled vegetables, and relishes. Olives. [place] fries. Onion rings. Regular canned vegetables (not low-sodium or reduced-sodium). Regular canned tomato sauce and paste (not low-sodium or reduced-sodium). Regular tomato and vegetable juice (not low-sodium or reduced-sodium). Frozen vegetables in sauces. Meats and other protein foods Meat or fish that is salted, canned, smoked, spiced, or pickled. Bacon, ham, sausage, hotdogs, corned beef, chipped beef, packaged lunch meats, salt pork, jerky, pickled herring, anchovies, regular canned tuna, sardines, salted nuts. Dairy Processed cheese and cheese spreads. Cheese curds. Blue cheese. Feta cheese. String cheese. Regular cottage cheese. Buttermilk. Canned milk. Fats and oils Salted butter. Regular margarine. Ghee. Bacon fat. Seasonings and other foods Onion salt, garlic salt, seasoned salt, table salt, and sea salt. Canned and packaged gravies.  Worcestershire sauce. Tartar sauce. Barbecue sauce. Teriyaki sauce. Soy sauce, including reduced-sodium. Steak sauce. Fish sauce. Oyster sauce. Cocktail sauce. Horseradish that you find on the shelf. Regular ketchup and mustard. Meat flavorings and tenderizers. Bouillon cubes. Hot sauce and Tabasco sauce. Premade or packaged marinades. Premade or packaged taco seasonings. Relishes. Regular salad dressings. Salsa. Potato and tortilla chips. Corn chips and puffs. Salted popcorn and pretzels. Canned or dried soups. Pizza. Frozen entrees and pot pies. Summary  Eating less sodium can help lower your blood pressure, reduce swelling, and protect your heart, liver, and kidneys.  Most people on this plan should limit their sodium intake to 1,500-2,000 mg (milligrams) of sodium each day.  Canned, boxed, and frozen foods are high in sodium. Restaurant foods, fast foods, and pizza are also very high in sodium. You also get sodium by adding salt to food.  Try to cook at home, eat more fresh fruits and vegetables, and eat less fast food, canned, processed, or prepared foods. This information is not intended to replace advice given to you by your health care provider. Make sure you discuss any questions you have with your health care provider. Document Released: 09/20/2001 Document Revised: 03/24/2016 Document Reviewed: 03/24/2016 Elsevier Interactive Patient Education  2017 Elsevier Inc.  Diabetes Mellitus and Food It is important for you to manage your blood sugar (glucose) level. Your blood glucose level can be greatly affected by what you eat. Eating healthier foods in the appropriate amounts throughout the day at about the same time each day will help you control your blood glucose level. It can also help slow or prevent worsening of your diabetes mellitus. Healthy eating may  even help you improve the level of your blood pressure and reach or maintain a healthy weight. General recommendations for healthful  eating and cooking habits include:  Eating meals and snacks regularly. Avoid going long periods of time without eating to lose weight.  Eating a diet that consists mainly of plant-based foods, such as fruits, vegetables, nuts, legumes, and whole grains.  Using low-heat cooking methods, such as baking, instead of high-heat cooking methods, such as deep frying.  Work with your dietitian to make sure you understand how to use the Nutrition Facts information on food labels. How can food affect me? Carbohydrates Carbohydrates affect your blood glucose level more than any other type of food. Your dietitian will help you determine how many carbohydrates to eat at each meal and teach you how to count carbohydrates. Counting carbohydrates is important to keep your blood glucose at a healthy level, especially if you are using insulin or taking certain medicines for diabetes mellitus. Alcohol Alcohol can cause sudden decreases in blood glucose (hypoglycemia), especially if you use insulin or take certain medicines for diabetes mellitus. Hypoglycemia can be a life-threatening condition. Symptoms of hypoglycemia (sleepiness, dizziness, and disorientation) are similar to symptoms of having too much alcohol. If your health care provider has given you approval to drink alcohol, do so in moderation and use the following guidelines:  Women should not have more than one drink per day, and men should not have more than two drinks per day. One drink is equal to: ? 12 oz of beer. ? 5 oz of wine. ? 1 oz of hard liquor.  Do not drink on an empty stomach.  Keep yourself hydrated. Have water, diet soda, or unsweetened iced tea.  Regular soda, juice, and other mixers might contain a lot of carbohydrates and should be counted.  What foods are not recommended? As you make food choices, it is important to remember that all foods are not the same. Some foods have fewer nutrients per serving than other foods, even  though they might have the same number of calories or carbohydrates. It is difficult to get your body what it needs when you eat foods with fewer nutrients. Examples of foods that you should avoid that are high in calories and carbohydrates but low in nutrients include:  Trans fats (most processed foods list trans fats on the Nutrition Facts label).  Regular soda.  Juice.  Candy.  Sweets, such as cake, pie, doughnuts, and cookies.  Fried foods.  What foods can I eat? Eat nutrient-rich foods, which will nourish your body and keep you healthy. The food you should eat also will depend on several factors, including:  The calories you need.  The medicines you take.  Your weight.  Your blood glucose level.  Your blood pressure level.  Your cholesterol level.  You should eat a variety of foods, including:  Protein. ? Lean cuts of meat. ? Proteins low in saturated fats, such as fish, egg whites, and beans. Avoid processed meats.  Fruits and vegetables. ? Fruits and vegetables that may help control blood glucose levels, such as apples, mangoes, and yams.  Dairy products. ? Choose fat-free or low-fat dairy products, such as milk, yogurt, and cheese.  Grains, bread, pasta, and rice. ? Choose whole grain products, such as multigrain bread, whole oats, and brown rice. These foods may help control blood pressure.  Fats. ? Foods containing healthful fats, such as nuts, avocado, olive oil, canola oil, and fish.  Does everyone with  diabetes mellitus have the same meal plan? Because every person with diabetes mellitus is different, there is not one meal plan that works for everyone. It is very important that you meet with a dietitian who will help you create a meal plan that is just right for you. This information is not intended to replace advice given to you by your health care provider. Make sure you discuss any questions you have with your health care provider. Document Released:  12/26/2004 Document Revised: 09/06/2015 Document Reviewed: 02/25/2013 Elsevier Interactive Patient Education  2017 [employer].

## 2017-01-21 ENCOUNTER — Telehealth: Payer: Self-pay

## 2017-01-21 ENCOUNTER — Ambulatory Visit: Payer: Self-pay | Admitting: Family Medicine

## 2017-01-21 NOTE — Telephone Encounter (Signed)
   [facility] Pre-operative Risk Assessment    Request for surgical clearance:  1. What type of surgery is being performed? Dental treatments: cleaning (simple or deep), radiographs, fillings, extractions   2. When is this surgery scheduled? pending   3. Are there any medications that need to be held prior to surgery and how long? Brilinta 90 mg BID   4. Practice name and name of physician performing surgery? [facility] - Dr Halford Chessman   5. What is your office phone and fax number? Phone: [phone], Fax: [phone]   6. Anesthesia type (None, local, MAC, general)? local   Joshua [name], Joshua [name] 01/21/2017, 4:51 PM  _________________________________________________________________   (provider comments below)

## 2017-01-22 NOTE — Telephone Encounter (Signed)
   In reviewing the patient's chart, he just recently underwent stent placement on 12/29/2016. Brilinta CANNOT be stopped for at least 6 months. Brilinta should not need to be held for cleanings or radiographs. Any extractions should be postponed for at least 6 months.   Signed, Erma Heritage, PA-C 01/22/2017, 3:52 PM

## 2017-01-23 ENCOUNTER — Encounter: Payer: Self-pay | Admitting: Cardiovascular Disease

## 2017-01-23 ENCOUNTER — Inpatient Hospital Stay: Payer: Self-pay

## 2017-01-27 MED FILL — hydrALAZINE HCL 25 MG TABS: 25 | 30 days supply | Qty: 90 | Fill #1

## 2017-01-27 MED FILL — BRILINTA 90 MG TABLET: 90 | 8 days supply | Qty: 16 | Fill #1

## 2017-01-27 MED FILL — ?POTASSIUM CL ER 10MEQ TAB: 10 | 30 days supply | Qty: 30 | Fill #1

## 2017-01-27 MED FILL — LISINOPRIL 10 MG TABS: 10 | 30 days supply | Qty: 30 | Fill #1

## 2017-01-27 MED FILL — ?FUROSEMIDE 40 MG TABLET: 40 | 30 days supply | Qty: 30 | Fill #1

## 2017-02-02 ENCOUNTER — Ambulatory Visit: Payer: Self-pay | Attending: Internal Medicine

## 2017-02-02 MED FILL — **BRILINTA 90 MG TABLET: 90 | 30 days supply | Qty: 60 | Fill #2

## 2017-02-04 ENCOUNTER — Inpatient Hospital Stay: Payer: Self-pay

## 2017-02-10 ENCOUNTER — Ambulatory Visit: Payer: Medicaid Other | Attending: Internal Medicine | Admitting: Physician Assistant

## 2017-02-10 VITALS — BP 114/75 | HR 74 | Temp 97.7°F | Ht 73.0 in | Wt 224.8 lb

## 2017-02-10 DIAGNOSIS — I429 Cardiomyopathy, unspecified: Secondary | ICD-10-CM

## 2017-02-10 DIAGNOSIS — M1990 Unspecified osteoarthritis, unspecified site: Secondary | ICD-10-CM | POA: Insufficient documentation

## 2017-02-10 DIAGNOSIS — R001 Bradycardia, unspecified: Secondary | ICD-10-CM | POA: Insufficient documentation

## 2017-02-10 DIAGNOSIS — R0683 Snoring: Secondary | ICD-10-CM | POA: Diagnosis not present

## 2017-02-10 DIAGNOSIS — Z7982 Long term (current) use of aspirin: Secondary | ICD-10-CM | POA: Diagnosis not present

## 2017-02-10 DIAGNOSIS — Z79899 Other long term (current) drug therapy: Secondary | ICD-10-CM | POA: Diagnosis not present

## 2017-02-10 DIAGNOSIS — E785 Hyperlipidemia, unspecified: Secondary | ICD-10-CM | POA: Insufficient documentation

## 2017-02-10 DIAGNOSIS — M25551 Pain in right hip: Secondary | ICD-10-CM | POA: Insufficient documentation

## 2017-02-10 DIAGNOSIS — I2510 Atherosclerotic heart disease of native coronary artery without angina pectoris: Secondary | ICD-10-CM | POA: Diagnosis not present

## 2017-02-10 DIAGNOSIS — I5021 Acute systolic (congestive) heart failure: Secondary | ICD-10-CM | POA: Diagnosis not present

## 2017-02-10 DIAGNOSIS — I110 Hypertensive heart disease with heart failure: Secondary | ICD-10-CM | POA: Insufficient documentation

## 2017-02-10 DIAGNOSIS — Z8249 Family history of ischemic heart disease and other diseases of the circulatory system: Secondary | ICD-10-CM | POA: Insufficient documentation

## 2017-02-10 DIAGNOSIS — Z955 Presence of coronary angioplasty implant and graft: Secondary | ICD-10-CM | POA: Insufficient documentation

## 2017-02-10 DIAGNOSIS — I10 Essential (primary) hypertension: Secondary | ICD-10-CM

## 2017-02-10 MED ORDER — LIDOCAINE 5 % EX PTCH: 1.0000 | MEDICATED_PATCH | CUTANEOUS | 3 refills | Status: DC

## 2017-02-10 MED FILL — ?LIDOCAINE 5% PATCH: 5 | 30 days supply | Qty: 30 | Fill #0

## 2017-02-10 NOTE — Patient Instructions (Signed)
Take all meds as prescribed pls Keep all follow ups

## 2017-02-10 NOTE — Progress Notes (Signed)
Joshua [name]  OZD:[id number]  QVZ:[id number]  DOB - 10-20-1960  Chief Complaint  Patient presents with  . Hospitalization Follow-up       Subjective:   Joshua [name] is a 56 y.o. male here today for establishment of care. He has a history of hypertension, osteoarthritis and prediabetes. He presented to the hospital on 4 2018 with dyspnea that have been progressing over the last month. He also had orthopnea and lower extremity edema. He was hypertensive upon arrival. His peptide was greater than 1500. He had a 2/6 murmur. He had 2+ edema. His EKG did not show any acute changes. He was admitted by the cardiology team for CHF management. He underwent echocardiography which showed an EF of 25-30%. Initially there were some concerns for severe aortic stenosis but this was ruled out with cardiac catheterization. The cath also revealed significant two-vessel disease and he underwent drug-eluting stenting of the left anterior dyspnea artery and right coronary artery. He was placed on guideline directed medical therapy with the exception of beta blockers. He was bradycardic nocturnally which limited his beta blocker usage. There is been reference to potential outpatient sleep apnea. Is a she'll was 29. His LDL was 86.  He has been seen in follow-up by cardiology and no changes were made.  He's been doing very well. No chest pain. No shortness of breath. Trying to watch his diet. His activity is limited by right hip pain and he is requesting something for pain as needed. No dizziness. No lightheadedness. Compliant with his medications. Nonsmoker.    ROS: GEN: denies fever or chills, denies change in weight Skin: denies lesions or rashes HEENT: denies headache, earache, epistaxis, sore throat, or neck pain LUNGS: denies SHOB, dyspnea, PND, orthopnea CV: denies CP or palpitations ABD: denies abd pain, N or V EXT: denies muscle spasms or swelling; no pain in lower ext, no weakness NEURO:  denies numbness or tingling, denies sz, stroke or TIA  ALLERGIES: No Known Allergies  PAST MEDICAL HISTORY: Past Medical History:  Diagnosis Date  . CAD (coronary artery disease), native coronary artery 12/26/2016   DES LAD & RCA, EF 25-30%  . Hypertension   . Obesity   . Osteoarthritis   . Pre-diabetes    a. prior A1C 5.6, states he was on meds for this at one point.    PAST SURGICAL HISTORY: Past Surgical History:  Procedure Laterality Date  . CORONARY STENT INTERVENTION N/A 12/29/2016   Procedure: CORONARY STENT INTERVENTION;  Surgeon: Jettie Booze, MD;  Location: [facility];  Service: Cardiovascular;  Laterality: N/A;  . RIGHT/LEFT HEART CATH AND CORONARY ANGIOGRAPHY N/A 12/29/2016   Procedure: RIGHT/LEFT HEART CATH AND CORONARY ANGIOGRAPHY;  Surgeon: Jettie Booze, MD;  Location: [facility];  Service: Cardiovascular;  Laterality: N/A;    MEDICATIONS AT HOME: Prior to Admission medications   Medication Sig Start Date End Date Taking? Authorizing Provider  aspirin 81 MG chewable tablet Chew 1 tablet (81 mg total) by mouth daily. 12/31/16  Yes Sela Hilding, MD  atorvastatin (LIPITOR) 40 MG tablet Take 1 tablet (40 mg total) by mouth daily at 6 PM. 12/31/16  Yes Sela Hilding, MD  furosemide (LASIX) 40 MG tablet Take 1 tablet (40 mg total) by mouth daily. 12/31/16  Yes Sela Hilding, MD  hydrALAZINE (APRESOLINE) 25 MG tablet Take 0.5 tablets (12.5 mg total) by mouth 3 (three) times daily. 1/2 to 1 tablet every 8 hours 01/15/17  Yes Barrett, Evelene Croon, PA-C  lisinopril (PRINIVIL,ZESTRIL) 10 MG tablet Take 0.5 tablets (5 mg total) by mouth daily. 5-10 mg daily as directed 01/15/17  Yes Barrett, Rhonda G, PA-C  potassium chloride (K-DUR,KLOR-CON) 10 MEQ tablet Take 1 tablet (10 mEq total) by mouth daily. 12/31/16  Yes Sela Hilding, MD  ticagrelor (BRILINTA) 90 MG TABS tablet Take 1 tablet (90 mg total) by mouth 2 (two) times daily.  12/31/16  Yes Sela Hilding, MD    Family History  Problem Relation Age of Onset  . CAD Father        MI/CABG age 109   Social- married, lives in [place], nonsmoker  Objective:   Vitals:   02/10/17 1500  BP: 114/75  Pulse: 74  Temp: 97.7 F (36.5 C)  TempSrc: Oral  SpO2: 97%  Weight: 224 lb 12.8 oz (102 kg)  Height: 6\' 1"  (1.854 m)    Exam General appearance : Awake, alert, not in any distress. Speech Clear. Not toxic looking HEENT: Atraumatic and Normocephalic, pupils equally reactive to light and accomodation Neck: supple, no JVD. No cervical lymphadenopathy.  Chest:Good air entry bilaterally, no added sounds  CVS: S1 S2 regular, no murmurs.  Abdomen: Bowel sounds present, Non tender and not distended with no guarding, rigidity or rebound. Extremities: B/L Lower Ext shows no edema, both legs are warm to touch Neurology: Awake alert, and oriented X 3, CN II-XII intact, Non focal Skin:No Rash Wounds:N/A  Data Review Lab Results  Component Value Date   HGBA1C 5.8 (H) 12/27/2016     Assessment & Plan  1. Acute systolic Heart Failure/HFrEF  -GDMT except BB 2/2 nocturnal brady  -low salt diet  -keep appt with Cardiology  -re check echo in 3 mo  2. ICM EF 25-30%  -as above  -if can afford, Entresto at some point    3. CAD s/p PCI DES LAD and RCA  -GDMT/DAPT  -consider cardiac rehab  -keep appt with Cardiology  4. HTN  -Cont current meds  -hope to add BB at some point  -BMP today  5. Bradycardia/snoring  -check sleep study  6. Dyslipidemia  -cont Statin  7. Right hip pain  -lidoderm patches for now  Financial counselor appt Return in about 4 weeks (around 03/10/2017).  The patient was given clear instructions to go to ER or return to medical center if symptoms don't improve, worsen or new problems develop. The patient verbalized understanding. The patient was told to call to get lab results if they haven't heard anything in the next week.    Total time spent with patient was 32 min. Greater than 50 % of this visit was spent face to face counseling and coordinating care regarding risk factor modification, compliance importance and encouragement, education related to CAD, HTN, and heart failure.  This note has been created with Surveyor, quantity. Any transcriptional errors are unintentional.    Zettie Pho, PA-C [facility] and [facility] [place], [place]   02/10/2017, 3:04 PM

## 2017-02-11 LAB — BASIC METABOLIC PANEL
BUN / CREAT RATIO: 12 (ref 9–20)
BUN: 11 mg/dL (ref 6–24)
CALCIUM: 10 mg/dL (ref 8.7–10.2)
CHLORIDE: 99 mmol/L (ref 96–106)
CO2: 25 mmol/L (ref 20–29)
Creatinine, Ser: 0.93 mg/dL (ref 0.76–1.27)
GFR calc non Af Amer: 90 mL/min/{1.73_m2} (ref >59)
GFR, EST AFRICAN AMERICAN: 104 mL/min/{1.73_m2} (ref >59)
GLUCOSE: 140 mg/dL — AB (ref 65–99)
POTASSIUM: 4.3 mmol/L (ref 3.5–5.2)
Sodium: 141 mmol/L (ref 134–144)

## 2017-02-25 ENCOUNTER — Other Ambulatory Visit: Payer: Self-pay | Admitting: Physician Assistant

## 2017-02-25 DIAGNOSIS — R0683 Snoring: Secondary | ICD-10-CM

## 2017-02-25 DIAGNOSIS — G4734 Idiopathic sleep related nonobstructive alveolar hypoventilation: Secondary | ICD-10-CM

## 2017-02-25 MED FILL — ?FUROSEMIDE 40 MG TABLET: 40 | 30 days supply | Qty: 30 | Fill #2

## 2017-02-25 MED FILL — hydrALAZINE HCL 25 MG TABS: 25 | 30 days supply | Qty: 90 | Fill #0

## 2017-02-25 MED FILL — ?POTASSIUM CL ER 10MEQ TAB: 10 | 30 days supply | Qty: 30 | Fill #2

## 2017-02-25 MED FILL — LISINOPRIL 10 MG TABS: 10 | 30 days supply | Qty: 30 | Fill #2

## 2017-02-27 MED FILL — BRILINTA 90 MG TABLET: 90 | 8 days supply | Qty: 16 | Fill #3

## 2017-03-03 ENCOUNTER — Other Ambulatory Visit: Payer: Self-pay | Admitting: *Deleted

## 2017-03-03 MED ORDER — TICAGRELOR 90 MG PO TABS: 90.0000 mg | ORAL_TABLET | Freq: Two times a day (BID) | ORAL | 3 refills | Status: DC

## 2017-03-03 NOTE — Telephone Encounter (Signed)
PRINTED FOR PASS PROGRAM 

## 2017-03-18 MED FILL — $BRILINTA 90 MG TABLET: 90 | 30 days supply | Qty: 60 | Fill #0

## 2017-03-19 MED FILL — ?LIDOCAINE 5% PATCH: 5 | 30 days supply | Qty: 30 | Fill #1

## 2017-03-23 ENCOUNTER — Ambulatory Visit: Payer: Self-pay

## 2017-03-25 ENCOUNTER — Other Ambulatory Visit: Payer: Self-pay | Admitting: Family Medicine

## 2017-03-25 MED FILL — ?FUROSEMIDE 40 MG TABLET: 40 | 30 days supply | Qty: 30 | Fill #3

## 2017-03-27 ENCOUNTER — Ambulatory Visit: Payer: Self-pay | Attending: Internal Medicine

## 2017-03-30 ENCOUNTER — Ambulatory Visit: Payer: Self-pay

## 2017-03-31 ENCOUNTER — Ambulatory Visit: Payer: Medicaid Other | Attending: Internal Medicine | Admitting: Internal Medicine

## 2017-03-31 ENCOUNTER — Encounter: Payer: Self-pay | Admitting: Internal Medicine

## 2017-03-31 VITALS — BP 108/69 | HR 66 | Temp 97.4°F | Resp 16 | Wt 225.6 lb

## 2017-03-31 DIAGNOSIS — Z955 Presence of coronary angioplasty implant and graft: Secondary | ICD-10-CM | POA: Diagnosis not present

## 2017-03-31 DIAGNOSIS — I350 Nonrheumatic aortic (valve) stenosis: Secondary | ICD-10-CM | POA: Diagnosis not present

## 2017-03-31 DIAGNOSIS — M1611 Unilateral primary osteoarthritis, right hip: Secondary | ICD-10-CM | POA: Insufficient documentation

## 2017-03-31 DIAGNOSIS — I2510 Atherosclerotic heart disease of native coronary artery without angina pectoris: Secondary | ICD-10-CM | POA: Insufficient documentation

## 2017-03-31 DIAGNOSIS — I10 Essential (primary) hypertension: Secondary | ICD-10-CM | POA: Insufficient documentation

## 2017-03-31 DIAGNOSIS — Z6829 Body mass index (BMI) 29.0-29.9, adult: Secondary | ICD-10-CM | POA: Diagnosis not present

## 2017-03-31 DIAGNOSIS — E669 Obesity, unspecified: Secondary | ICD-10-CM | POA: Diagnosis not present

## 2017-03-31 DIAGNOSIS — I255 Ischemic cardiomyopathy: Secondary | ICD-10-CM | POA: Diagnosis not present

## 2017-03-31 DIAGNOSIS — Z87891 Personal history of nicotine dependence: Secondary | ICD-10-CM | POA: Insufficient documentation

## 2017-03-31 DIAGNOSIS — R7303 Prediabetes: Secondary | ICD-10-CM | POA: Diagnosis not present

## 2017-03-31 DIAGNOSIS — F1290 Cannabis use, unspecified, uncomplicated: Secondary | ICD-10-CM

## 2017-03-31 DIAGNOSIS — R001 Bradycardia, unspecified: Secondary | ICD-10-CM | POA: Insufficient documentation

## 2017-03-31 MED ORDER — TRAMADOL HCL 50 MG PO TABS: 50.0000 mg | ORAL_TABLET | Freq: Two times a day (BID) | ORAL | 1 refills | Status: DC | PRN

## 2017-03-31 MED ORDER — LISINOPRIL 5 MG PO TABS: 5.0000 mg | ORAL_TABLET | Freq: Every day | ORAL | 5 refills | Status: DC

## 2017-03-31 MED ORDER — POTASSIUM CHLORIDE CRYS ER 10 MEQ PO TBCR: 10.0000 meq | EXTENDED_RELEASE_TABLET | Freq: Every day | ORAL | 5 refills | Status: DC

## 2017-03-31 MED FILL — ?POTASSIUM CL ER 10MEQ TAB: 10 | 30 days supply | Qty: 30 | Fill #0

## 2017-03-31 MED FILL — traMADol HCL 50 MG TABS: 50 | 30 days supply | Qty: 60 | Fill #0

## 2017-03-31 MED FILL — ?LISINOPRIL 5 MG TABLET: 5 | 30 days supply | Qty: 30 | Fill #0

## 2017-03-31 NOTE — Progress Notes (Signed)
Patient ID: RED MANDT, male    DOB: 12-10-1960  MRN: [id number]  CC: Establish Care   Subjective: Joshua [name] is a 56 y.o. male who presents for chronic ds management. His concerns today include:  Patient with history of HTN, OA right hip, prediabetes, CAD [with stent to LAD, RCA], EF 25-30%, bradycardia 1. CAD/ICM: -has f/u with cardiology with repeat echo next mth -no CP/SOB at rest. Some SOB if he over exerts himself. -meds:  taking Lisinopril 10 mg 1/2 tab BID. Concerns that it drops BP too low when he takes the 10 mg tab. Request rxn for the 5 mg tab.  Does not feel good when BP around 100. Taking Hydralazine PRN if BP is high.  States it drops his BP too low with symptoms Taking Furosemide, ASA and Brilinta as prescribed  2. Sleep study scheduled for  04/10/2017  3.  OA RT hip: sleeping better now that he has lidocaine patch to help with pain -feels he aggravated his hip a little when he started walking daily at [facility] after cardiac cath. No insurance so could not do cardiac rehab. He cut back walking to a few times a wk. Also using an exercise ball 45-60 mins and a solar flex machine at home. Does a little wgh training. Ambulates with cane. No falls -He has re-applied for disability and hopes to get an answer within the next 3 months  Patient Active Problem List   Diagnosis Date Noted  . Acute congestive heart failure ([place])   . Aortic valve insufficiency 12/28/2016  . Mitral valve regurgitation 12/28/2016  . Renal insufficiency 12/28/2016  . Obesity 12/28/2016  . Aortic valve stenosis   . Acute combined systolic and diastolic heart failure ([place])   . Hypertension   . Hyperglycemia   . Dyspnea 12/26/2016     Current Outpatient Medications on File Prior to Visit  Medication Sig Dispense Refill  . aspirin 81 MG chewable tablet Chew 1 tablet (81 mg total) by mouth daily. 90 tablet 3  . atorvastatin (LIPITOR) 40 MG tablet Take 1 tablet (40 mg total) by mouth  daily at 6 PM. 90 tablet 3  . furosemide (LASIX) 40 MG tablet Take 1 tablet (40 mg total) by mouth daily. 30 tablet 3  . hydrALAZINE (APRESOLINE) 25 MG tablet Take 0.5 tablets (12.5 mg total) by mouth 3 (three) times daily. 1/2 to 1 tablet every 8 hours 90 tablet 0  . lidocaine (LIDODERM) 5 % Place 1 patch onto the skin daily. Remove & Discard patch within 12 hours or as directed by MD 30 patch 3  . ticagrelor (BRILINTA) 90 MG TABS tablet Take 1 tablet (90 mg total) by mouth 2 (two) times daily. 180 tablet 3   No current facility-administered medications on file prior to visit.     No Known Allergies  Social History   Socioeconomic History  . Marital status: Single    Spouse name: Not on file  . Number of children: Not on file  . Years of education: Not on file  . Highest education level: Not on file  Social Needs  . Financial resource strain: Not on file  . Food insecurity - worry: Not on file  . Food insecurity - inability: Not on file  . Transportation needs - medical: Not on file  . Transportation needs - non-medical: Not on file  Occupational History  . Not on file  Tobacco Use  . Smoking status: Former Research scientist (life sciences)  . Smokeless  tobacco: Never Used  . Tobacco comment: Smoked lightly for approx 20 years, quit 1990s  Substance and Sexual Activity  . Alcohol use: Yes    Comment: 1-2 mixed drinks per week  . Drug use: Yes    Types: Marijuana  . Sexual activity: Not on file  Other Topics Concern  . Not on file  Social History Narrative   Lives Home alone. Sister makes all health related decisions. Declined Advanced directive at this time    Family History  Problem Relation Age of Onset  . CAD Father        MI/CABG age 67    Past Surgical History:  Procedure Laterality Date  . CORONARY STENT INTERVENTION N/A 12/29/2016   Procedure: CORONARY STENT INTERVENTION;  Surgeon: Jettie Booze, MD;  Location: [facility];  Service: Cardiovascular;  Laterality: N/A;  .  RIGHT/LEFT HEART CATH AND CORONARY ANGIOGRAPHY N/A 12/29/2016   Procedure: RIGHT/LEFT HEART CATH AND CORONARY ANGIOGRAPHY;  Surgeon: Jettie Booze, MD;  Location: [facility];  Service: Cardiovascular;  Laterality: N/A;    ROS: Review of Systems Negative except as stated above PHYSICAL EXAM: BP 108/69   Pulse 66   Temp (!) 97.4 F (36.3 C) (Oral)   Resp 16   Wt 225 lb 9.6 oz (102.3 kg)   SpO2 95%   BMI 29.76 kg/m   Wt Readings from Last 3 Encounters:  03/31/17 225 lb 9.6 oz (102.3 kg)  02/10/17 224 lb 12.8 oz (102 kg)  01/15/17 234 lb (106.1 kg)   Physical Exam  General appearance - alert, well appearing, middle-aged to older Caucasian male and in no distress Mental status - alert, oriented to person, place, and time, normal mood, behavior, speech, dress, motor activity, and thought processes Eyes -pink conjunctiva Neck - supple, no significant adenopathy Chest - clear to auscultation, no wheezes, rales or rhonchi, symmetric air entry Heart - normal rate, regular rhythm, normal S1, S2, prominent 2-3/6SEM lower sternal border Musculoskeletal - antalgic gait, walks with standard cane Extremities - no LE edema  Lab Results  Component Value Date   WBC 9.5 12/30/2016   HGB 16.3 12/30/2016   HCT 48.8 12/30/2016   MCV 87.1 12/30/2016   PLT 174 12/30/2016     Chemistry      Component Value Date/Time   NA 141 02/10/2017 1530   K 4.3 02/10/2017 1530   CL 99 02/10/2017 1530   CO2 25 02/10/2017 1530   BUN 11 02/10/2017 1530   CREATININE 0.93 02/10/2017 1530      Component Value Date/Time   CALCIUM 10.0 02/10/2017 1530   ALKPHOS 110 01/15/2017 1116   AST 23 01/15/2017 1116   ALT 28 01/15/2017 1116   BILITOT 0.3 01/15/2017 1116      ASSESSMENT AND PLAN: 1. Primary osteoarthritis of right hip -continue Lidocaine patch PRN. Discussed adding Tramadol PRN.  Patient informed that the medication is a narcotic and as such can become habit forming.  Advised to  use only when needed. -We did a controlled substance prescribing agreement with him.  Patient admits that he uses marijuana occasionally.  Advised that with our prescribing agreement he cannot use any street substances.  Advised that we do random urine drug screens and if positive we will discount prescribing. Pt expressed understanding -Encouraged him to apply for the [facility] discount.  Once approved we can refer him to orthopedics - traMADol (ULTRAM) 50 MG tablet; Take 1 tablet (50 mg total) by mouth  2 (two) times daily as needed.  Dispense: 60 tablet; Refill: 1 - DG HIP UNILAT WITH PELVIS 1V RIGHT; Future  2. Coronary artery disease involving native coronary artery of native heart without angina pectoris 3. Ischemic cardiomyopathy -stable. Rxn for Lisinopril rewritten as 5 mg BID.  Encourage patient to take the medication as it will help with improving heart function. -Still bradycardic.  Cannot add beta-blocker at this time Keep follow-up appointment with cardiology. 4. Marijuana use See discussion #1 above.  Patient was given the opportunity to ask questions.  Patient verbalized understanding of the plan and was able to repeat key elements of the plan.   Orders Placed This Encounter  Procedures  . DG HIP UNILAT WITH PELVIS 1V RIGHT     Requested Prescriptions   Signed Prescriptions Disp Refills  . lisinopril (PRINIVIL,ZESTRIL) 5 MG tablet 60 tablet 5    Sig: Take 1 tablet (5 mg total) by mouth daily.  . potassium chloride (K-DUR,KLOR-CON) 10 MEQ tablet 30 tablet 5    Sig: Take 1 tablet (10 mEq total) by mouth daily.  . traMADol (ULTRAM) 50 MG tablet 60 tablet 1    Sig: Take 1 tablet (50 mg total) by mouth 2 (two) times daily as needed.    Return in about 3 months (around 06/29/2017).  Karle Plumber, MD, FACP

## 2017-04-09 ENCOUNTER — Encounter: Payer: Self-pay | Admitting: *Deleted

## 2017-04-10 ENCOUNTER — Ambulatory Visit (HOSPITAL_BASED_OUTPATIENT_CLINIC_OR_DEPARTMENT_OTHER): Payer: Medicaid Other | Attending: Physician Assistant | Admitting: Internal Medicine

## 2017-04-10 DIAGNOSIS — R0902 Hypoxemia: Secondary | ICD-10-CM | POA: Insufficient documentation

## 2017-04-10 DIAGNOSIS — G4734 Idiopathic sleep related nonobstructive alveolar hypoventilation: Secondary | ICD-10-CM

## 2017-04-10 DIAGNOSIS — R0683 Snoring: Secondary | ICD-10-CM | POA: Diagnosis not present

## 2017-04-14 DIAGNOSIS — R0683 Snoring: Secondary | ICD-10-CM | POA: Diagnosis not present

## 2017-04-16 NOTE — Procedures (Signed)
    Patient Name: Joshua [name], Joshua [name] Date: 04/10/2017 Gender: Male D.O.B: 1960/04/29 Age (years): 11 Referring Provider: Sena Slate. Noel Height (inches): 73 Interpreting Physician: Baird Lyons MD, ABSM Weight (lbs): 220 RPSGT: Earney [place] BMI: 29 MRN: [id number] Neck Size: 18.00 <br> <br> CLINICAL INFORMATION Sleep Study Type: NPSG Indication for sleep study: Snoring Epworth Sleepiness Score: 15  SLEEP STUDY TECHNIQUE As per the AASM Manual for the Scoring of Sleep and Associated Events v2.3 (April 2016) with a hypopnea requiring 4% desaturations.  The channels recorded and monitored were frontal, central and occipital EEG, electrooculogram (EOG), submentalis EMG (chin), nasal and oral airflow, thoracic and abdominal wall motion, anterior tibialis EMG, snore microphone, electrocardiogram, and pulse oximetry.  MEDICATIONS Medications self-administered by patient taken the night of the study : TYLENOL PM  SLEEP ARCHITECTURE The study was initiated at 12:11:22 AM and ended at 6:13:24 AM.  Sleep onset time was 0.8 minutes and the sleep efficiency was 80.7%. The total sleep time was 292.0 minutes.  Stage REM latency was 110.5 minutes.  The patient spent 3.60% of the night in stage N1 sleep, 85.79% in stage N2 sleep, 0.00% in stage N3 and 10.62% in REM.  Alpha intrusion was absent.  Supine sleep was 35.07%.  RESPIRATORY PARAMETERS The overall apnea/hypopnea index (AHI) was 1.4 per hour. There were 1 total apneas, including 1 obstructive, 0 central and 0 mixed apneas. There were 6 hypopneas and 14 RERAs.  The AHI during Stage REM sleep was 3.9 per hour.  AHI while supine was 2.3 per hour.  The mean oxygen saturation was 88.30%.   moderate snoring was noted during this study.  CARDIAC DATA The 2 lead EKG demonstrated sinus rhythm. The mean heart rate was 57.08 beats per minute. Other EKG findings include: None.  LEG MOVEMENT DATA The total PLMS were 3  with a resulting PLMS index of 0.62. Associated arousal with leg movement index was 0.0 .  IMPRESSIONS - No significant obstructive sleep apnea occurred during this study (AHI = 1.4/h). - No significant central sleep apnea occurred during this study (CAI = 0.0/h). - Oxygen desaturation was noted during this study (Mean O2 = 88.30%). - The patient snored with moderate snoring volume. - No cardiac abnormalities were noted during this study. - Clinically significant periodic limb movements did not occur during sleep. No significant associated arousals.  DIAGNOSIS - Nocturnal Hypoxemia (327.26 [G47.36 ICD-10])  RECOMMENDATIONS - Suggest ovenight oximetry to reassess sleep hypoxemia and consider need for supplemental O2 if appropriate.. - Sleep hygiene should be reviewed to assess factors that may improve sleep quality. - Weight management and regular exercise should be initiated or continued if appropriate.  [Electronically signed] 04/14/2017 09:59 AM  Baird Lyons MD, ABSM Diplomate, American Board of Sleep Medicine   NPI: [id number]                         [place] [place] [facility]  ELECTRONICALLY SIGNED ON:  04/16/2017, 1:06 PM [place] PH: [phone]   FX: [phone] [place]

## 2017-04-20 MED FILL — $BRILINTA 90 MG TABLET: 90 | 30 days supply | Qty: 60 | Fill #1

## 2017-04-22 ENCOUNTER — Telehealth: Payer: Self-pay | Admitting: Internal Medicine

## 2017-04-22 DIAGNOSIS — G4734 Idiopathic sleep related nonobstructive alveolar hypoventilation: Secondary | ICD-10-CM

## 2017-04-22 NOTE — Telephone Encounter (Signed)
PC placed to pt today to discuss sleep study results.  Cell phone listed is his father's phone.  His father told me to call the home phone number listed but this is a Engineer, structural number.  I left message that I called.  I will have my CMA try to reach him later.

## 2017-04-23 ENCOUNTER — Other Ambulatory Visit: Payer: Self-pay

## 2017-04-23 ENCOUNTER — Ambulatory Visit (HOSPITAL_COMMUNITY): Payer: Medicaid Other | Attending: Cardiovascular Disease

## 2017-04-23 DIAGNOSIS — Z87891 Personal history of nicotine dependence: Secondary | ICD-10-CM | POA: Diagnosis not present

## 2017-04-23 DIAGNOSIS — I255 Ischemic cardiomyopathy: Secondary | ICD-10-CM | POA: Diagnosis present

## 2017-04-23 DIAGNOSIS — I080 Rheumatic disorders of both mitral and aortic valves: Secondary | ICD-10-CM | POA: Insufficient documentation

## 2017-04-23 DIAGNOSIS — I2510 Atherosclerotic heart disease of native coronary artery without angina pectoris: Secondary | ICD-10-CM | POA: Insufficient documentation

## 2017-04-23 DIAGNOSIS — I10 Essential (primary) hypertension: Secondary | ICD-10-CM | POA: Insufficient documentation

## 2017-04-23 DIAGNOSIS — Z8249 Family history of ischemic heart disease and other diseases of the circulatory system: Secondary | ICD-10-CM | POA: Diagnosis not present

## 2017-04-23 DIAGNOSIS — E669 Obesity, unspecified: Secondary | ICD-10-CM | POA: Insufficient documentation

## 2017-04-24 ENCOUNTER — Ambulatory Visit: Payer: Self-pay | Attending: Internal Medicine

## 2017-04-24 ENCOUNTER — Other Ambulatory Visit: Payer: Self-pay | Admitting: Family Medicine

## 2017-04-24 NOTE — Telephone Encounter (Signed)
Pt is aware of sleep study results pt states he would like to get the over night oximetry study done

## 2017-04-27 ENCOUNTER — Encounter: Payer: Self-pay | Admitting: Cardiovascular Disease

## 2017-04-27 ENCOUNTER — Ambulatory Visit: Payer: Self-pay

## 2017-04-27 ENCOUNTER — Ambulatory Visit (INDEPENDENT_AMBULATORY_CARE_PROVIDER_SITE_OTHER): Payer: Medicaid Other | Admitting: Cardiovascular Disease

## 2017-04-27 VITALS — BP 140/85 | HR 69 | Ht 73.0 in | Wt 223.6 lb

## 2017-04-27 DIAGNOSIS — I10 Essential (primary) hypertension: Secondary | ICD-10-CM

## 2017-04-27 DIAGNOSIS — I2510 Atherosclerotic heart disease of native coronary artery without angina pectoris: Secondary | ICD-10-CM

## 2017-04-27 DIAGNOSIS — E785 Hyperlipidemia, unspecified: Secondary | ICD-10-CM | POA: Diagnosis not present

## 2017-04-27 DIAGNOSIS — I350 Nonrheumatic aortic (valve) stenosis: Secondary | ICD-10-CM

## 2017-04-27 DIAGNOSIS — I5032 Chronic diastolic (congestive) heart failure: Secondary | ICD-10-CM | POA: Insufficient documentation

## 2017-04-27 MED ORDER — LISINOPRIL 5 MG PO TABS: 5.0000 mg | ORAL_TABLET | Freq: Two times a day (BID) | ORAL | 3 refills | Status: DC

## 2017-04-27 MED ORDER — ATORVASTATIN CALCIUM 40 MG PO TABS: 40.0000 mg | ORAL_TABLET | Freq: Every day | ORAL | 3 refills | Status: DC

## 2017-04-27 MED FILL — LISINOPRIL 5 MG TAB: 5 | 30 days supply | Qty: 60 | Fill #0

## 2017-04-27 MED FILL — ?ATORVASTATIN 40MG TABLET: 40 | 30 days supply | Qty: 30 | Fill #0

## 2017-04-27 MED FILL — ?FUROSEMIDE 40 MG TABLET: 40 | 30 days supply | Qty: 30 | Fill #0

## 2017-04-27 NOTE — Progress Notes (Signed)
Cardiology Office Note:    Date:  04/27/2017   ID:  Joshua [name], DOB 10-14-1960, MRN [id number]  PCP:  Ladell Pier, MD  Cardiologist:  Sanda Klein, MD   Referring MD: No ref. provider found   Chief Complaint  Patient presents with  . Follow-up    pt reports no complaints  CAD, follow-up echocardiogram  History of Present Illness:    Joshua [name] is a 57 y.o. male who presented with decompensated heart failure and severe cardiomyopathy (EF 25-30 %) in September 2018 and was identified as having severe 2 vessel coronary artery disease, with drug-eluting stents placed to the LAD and RCA. Following revascularization he has had remarkable improvement in LV systolic function which is now normal (EF 55-60%). Retrospectively, he did have some chest pressure which has also resolved.  He also has moderate aortic stenosis with an estimated valve area around 1.5 cm and a mean aortic valve gradient around 25 mmHg. The gradient has remained constant despite the change in left ventricular systolic function. It appears she has a bicuspid valve and he does have very mild aortic root enlargement.  He feels great. He denies any problems with orthopnea, PND, lower extremity edema or exertional dyspnea or angina. He has not had palpitations, dizziness or syncope. He noticed that the lisinopril would produce hypotension and weakness early in the day and then allow his blood pressure to increase in the evenings. He has done much better since taking the lisinopril and two daily divided doses. He did not tolerate beta blockers due to extreme bradycardia.  Has not had any bleeding problems and is compliant with aspirin/ticagrelor antiplatelet therapy.  Past Medical History:  Diagnosis Date  . CAD (coronary artery disease), native coronary artery 12/26/2016   DES LAD & RCA, EF 25-30%  . Hypertension   . Obesity   . Osteoarthritis   . Pre-diabetes    a. prior A1C 5.6, states he was on  meds for this at one point.    Past Surgical History:  Procedure Laterality Date  . CORONARY STENT INTERVENTION N/A 12/29/2016   Procedure: CORONARY STENT INTERVENTION;  Surgeon: Jettie Booze, MD;  Location: [facility];  Service: Cardiovascular;  Laterality: N/A;  . RIGHT/LEFT HEART CATH AND CORONARY ANGIOGRAPHY N/A 12/29/2016   Procedure: RIGHT/LEFT HEART CATH AND CORONARY ANGIOGRAPHY;  Surgeon: Jettie Booze, MD;  Location: [facility];  Service: Cardiovascular;  Laterality: N/A;    Current Medications: Current Meds  Medication Sig  . aspirin 81 MG chewable tablet Chew 1 tablet (81 mg total) by mouth daily.  Marland Kitchen atorvastatin (LIPITOR) 40 MG tablet Take 1 tablet (40 mg total) by mouth daily at 6 PM.  . furosemide (LASIX) 40 MG tablet TAKE 1 TABLET BY MOUTH ONCE DAILY  . lidocaine (LIDODERM) 5 % Place 1 patch onto the skin daily. Remove & Discard patch within 12 hours or as directed by MD  . lisinopril (PRINIVIL,ZESTRIL) 5 MG tablet Take 1 tablet (5 mg total) by mouth 2 (two) times daily.  . potassium chloride (K-DUR,KLOR-CON) 10 MEQ tablet Take 1 tablet (10 mEq total) by mouth daily.  . ticagrelor (BRILINTA) 90 MG TABS tablet Take 1 tablet (90 mg total) by mouth 2 (two) times daily.  . traMADol (ULTRAM) 50 MG tablet Take 1 tablet (50 mg total) by mouth 2 (two) times daily as needed.  . [DISCONTINUED] atorvastatin (LIPITOR) 40 MG tablet Take 1 tablet (40 mg total) by mouth daily at  6 PM.  . [DISCONTINUED] hydrALAZINE (APRESOLINE) 25 MG tablet Take 0.5 tablets (12.5 mg total) by mouth 3 (three) times daily. 1/2 to 1 tablet every 8 hours  . [DISCONTINUED] lisinopril (PRINIVIL,ZESTRIL) 5 MG tablet Take 5 mg by mouth 2 (two) times daily.     Allergies:   Patient has no known allergies.   Social History   Socioeconomic History  . Marital status: Single    Spouse name: None  . Number of children: None  . Years of education: None  . Highest education level: None   Social Needs  . Financial resource strain: None  . Food insecurity - worry: None  . Food insecurity - inability: None  . Transportation needs - medical: None  . Transportation needs - non-medical: None  Occupational History  . None  Tobacco Use  . Smoking status: Former Research scientist (life sciences)  . Smokeless tobacco: Never Used  . Tobacco comment: Smoked lightly for approx 20 years, quit 1990s  Substance and Sexual Activity  . Alcohol use: Yes    Comment: 1-2 mixed drinks per week  . Drug use: Yes    Types: Marijuana  . Sexual activity: None  Other Topics Concern  . None  Social History Narrative   Lives Home alone. Sister makes all health related decisions. Declined Advanced directive at this time     Family History: The patient's family history includes CAD in his father.  ROS:   Please see the history of present illness.     All other systems reviewed and are negative.  EKGs/Labs/Other Studies Reviewed:    The following studies were reviewed today: Echo generates 10 2019. There is a  Echo her saying that there is no aortic stenosis, but the aortic valve is reported around a 1.57_the mean gradient of 26 mmHg and restricted valve mobility. Aorta 40 mm.  EKG:  EKG is not ordered today.    Recent Labs: 12/27/2016: TSH 2.493 12/29/2016: B Natriuretic Peptide 1,289.4; Magnesium 2.0 12/30/2016: Hemoglobin 16.3; Platelets 174 01/15/2017: ALT 28 02/10/2017: BUN 11; Creatinine, Ser 0.93; Potassium 4.3; Sodium 141  Recent Lipid Panel    Component Value Date/Time   CHOL 94 (L) 01/15/2017 1116   TRIG 131 01/15/2017 1116   HDL 38 (L) 01/15/2017 1116   CHOLHDL 2.5 01/15/2017 1116   CHOLHDL 4.6 12/27/2016 0238   VLDL 18 12/27/2016 0238   LDLCALC 30 01/15/2017 1116    Physical Exam:    VS:  BP 140/85   Pulse 69   Ht 6\' 1"  (1.854 m)   Wt 223 lb 9.6 oz (101.4 kg)   SpO2 98%   BMI 29.50 kg/m     Wt Readings from Last 3 Encounters:  04/27/17 223 lb 9.6 oz (101.4 kg)  04/10/17 220 lb  (99.8 kg)  03/31/17 225 lb 9.6 oz (102.3 kg)      General: Alert, oriented x3, no distress, borderline obese Head: no evidence of trauma, PERRL, EOMI, no exophtalmos or lid lag, no myxedema, no xanthelasma; normal ears, nose and oropharynx Neck: normal jugular venous pulsations and no hepatojugular reflux; brisk carotid pulses without delay and no carotid bruits Chest: clear to auscultation, no signs of consolidation by percussion or palpation, normal fremitus, symmetrical and full respiratory excursions Cardiovascular: normal position and quality of the apical impulse, regular rhythm, normal first and second heart sounds, 3/6 early peaking systolic ejection murmur heard broadly throughout the precordium and radiating to the carotids, no diastolic murmurs, rubs or gallops Abdomen: no tenderness or distention,  no masses by palpation, no abnormal pulsatility or arterial bruits, normal bowel sounds, no hepatosplenomegaly Extremities: no clubbing, cyanosis or edema; 2+ radial, ulnar and brachial pulses bilaterally; 2+ right femoral, posterior tibial and dorsalis pedis pulses; 2+ left femoral, posterior tibial and dorsalis pedis pulses; no subclavian or femoral bruits Neurological: grossly nonfocal Psych: Normal mood and affect   ASSESSMENT:    1. Coronary artery disease involving native coronary artery of native heart without angina pectoris   2. Chronic diastolic heart failure ([place])   3. Nonrheumatic aortic valve stenosis   4. Essential hypertension   5. Dyslipidemia (high LDL; low HDL)    PLAN:    In order of problems listed above:  1. CHF: remarkable clinical and echocardiographic improvement following revascularization. Currently asymptomatic, euvolemic. Recommend weaning off the loop diuretic. 2. CAD: Symptomatic since revascularization. Discussed potential symptoms of in-stent restenosis. Continue dual antiplatelet therapy through September 2019. 3. AS: probably bicuspid aortic valve,  moderate severity; mild associated aortic dilation. Suspect it'll be several years until AS becomes symptomatic. Discussed the symptoms that he might expect from this. Briefly reviewed options for surgical versus catheter-based aortic valve replacement, when symptomatic. 4. HTN: Continue with twice daily ACE inhibitor. Reevaluate blood pressure after discontinuation of loop diuretic. 5. HLP: Excellent reduction in LDL cholesterol on statin. Borderline low HDL cholesterol will not improve without additional weight loss. Target waist 34 inches. Excellent job with weight loss so far.    Medication Adjustments/Labs and Tests Ordered: Current medicines are reviewed at length with the patient today.  Concerns regarding medicines are outlined above.  No orders of the defined types were placed in this encounter.  Meds ordered this encounter  Medications  . atorvastatin (LIPITOR) 40 MG tablet    Sig: Take 1 tablet (40 mg total) by mouth daily at 6 PM.    Dispense:  90 tablet    Refill:  3  . lisinopril (PRINIVIL,ZESTRIL) 5 MG tablet    Sig: Take 1 tablet (5 mg total) by mouth 2 (two) times daily.    Dispense:  180 tablet    Refill:  3    Signed, Sanda Klein, MD  04/27/2017 2:34 PM    [place]

## 2017-04-27 NOTE — Patient Instructions (Addendum)
Dr Sallyanne Kuster has recommended making the following medication changes: 1. DECREASE Furosemide to 1 tablet THREE DAYS A WEEK Monitor weight daily. If you have no weight gain in 1 month, you can stop the Furosemide and Potassium 2. STOP Hydralazine  Your physician recommends that you schedule a follow-up appointment in 8 months. You will receive a reminder letter in the mail two months in advance. If you don't receive a letter, please call our office to schedule the follow-up appointment.  If you need a refill on your cardiac medications before your next appointment, please call your pharmacy.

## 2017-05-07 MED FILL — ?LIDOCAINE 5% PATCH: 5 | 30 days supply | Qty: 30 | Fill #2

## 2017-05-07 MED FILL — ?POTASSIUM CL ER 10MEQ TAB: 10 | 30 days supply | Qty: 30 | Fill #1

## 2017-05-18 MED FILL — LISINOPRIL 5 MG TABLET: 5 | 30 days supply | Qty: 60 | Fill #1

## 2017-05-18 MED FILL — $BRILINTA 90 MG TABLET: 90 | 30 days supply | Qty: 60 | Fill #2

## 2017-05-18 MED FILL — ?FUROSEMIDE 40 MG TABLET: 40 | 30 days supply | Qty: 30 | Fill #1

## 2017-06-08 MED FILL — ?LIDOCAINE 5% PATCH: 5 | 30 days supply | Qty: 30 | Fill #3

## 2017-06-08 MED FILL — ?ATORVASTATIN 40MG TABLET: 40 | 30 days supply | Qty: 30 | Fill #1

## 2017-06-08 MED FILL — POTASSIUM CL 10 MEQ TAB SA: 10 | 30 days supply | Qty: 30 | Fill #2

## 2017-06-09 ENCOUNTER — Other Ambulatory Visit: Payer: Self-pay | Admitting: Internal Medicine

## 2017-06-09 ENCOUNTER — Telehealth: Payer: Self-pay | Admitting: Internal Medicine

## 2017-06-09 ENCOUNTER — Ambulatory Visit (HOSPITAL_COMMUNITY)
Admission: RE | Admit: 2017-06-09 | Discharge: 2017-06-09 | Disposition: A | Discharge status: Home / Self Care | Payer: Medicaid Other | Source: Ambulatory Visit | Attending: Internal Medicine | Admitting: Internal Medicine

## 2017-06-09 DIAGNOSIS — M1611 Unilateral primary osteoarthritis, right hip: Secondary | ICD-10-CM

## 2017-06-09 NOTE — Telephone Encounter (Signed)
Patient stated so he could quit the other blood pressure medication. He consulted with another provider and requested to try the lower dose first.

## 2017-06-09 NOTE — Telephone Encounter (Signed)
Phone call placed to patient today.  I left a message inquiring why he wanted the lisinopril increased to 10 mg twice a day.  I will have the scheduler reach out to him and schedule a BP check with our clinical pharmacist..

## 2017-06-09 NOTE — Telephone Encounter (Signed)
Patient stopped by and requested for his linsinopril 5mg  to be increased to 10mg  BID. Please fu with patient at your earliest convenience. CB# [phone]

## 2017-06-10 ENCOUNTER — Encounter: Payer: Self-pay | Admitting: Pharmacist

## 2017-06-10 ENCOUNTER — Ambulatory Visit: Payer: Medicaid Other | Attending: Internal Medicine | Admitting: Pharmacist

## 2017-06-10 VITALS — BP 128/79 | HR 69

## 2017-06-10 DIAGNOSIS — I10 Essential (primary) hypertension: Secondary | ICD-10-CM | POA: Diagnosis not present

## 2017-06-10 MED ORDER — LISINOPRIL 10 MG PO TABS: 10.0000 mg | ORAL_TABLET | Freq: Two times a day (BID) | ORAL | 2 refills | Status: DC

## 2017-06-10 MED FILL — LISINOPRIL 10 MG TABS: 10 | 30 days supply | Qty: 60 | Fill #0

## 2017-06-10 NOTE — Progress Notes (Signed)
   S:    Patient arrives in good spirits.  Presents to the clinic for hypertension evaluation. Patient was referred on 06/09/17 by Dr. Wynetta Emery.    Patient reports adherence with medications.  Current BP Medications include:  Lisinopril 10 mg BID (increased it himself when he noticed that his BP went up after stopping hydralazine), furosemide 40 mg daily (was gaining weight so couldn't decrease dose to 3 times weekly as instructed to per cardiology)  Patient reports a decrease in exercise recently due to weather. He noticed weight gain when he tried to decrease the furosemide dose  Reported home BPs usually 110s/70s since increasing lisinopril dose to 10 mg BID.   O:   Last 3 Office BP readings: BP Readings from Last 3 Encounters:  06/10/17 128/79  04/27/17 140/85  03/31/17 108/69    BMET    Component Value Date/Time   NA 141 02/10/2017 1530   K 4.3 02/10/2017 1530   CL 99 02/10/2017 1530   CO2 25 02/10/2017 1530   GLUCOSE 140 (H) 02/10/2017 1530   GLUCOSE 101 (H) 12/31/2016 0228   BUN 11 02/10/2017 1530   CREATININE 0.93 02/10/2017 1530   CALCIUM 10.0 02/10/2017 1530   GFRNONAA 90 02/10/2017 1530   GFRAA 104 02/10/2017 1530    A/P: Hypertension longstanding currently controlled on current medications.  Continued current medications and updated the lisinopril script to reflect what the patient is taking. Encouraged him to follow up with cardiology or PCP regarding furosemide dosing but he will continue with daily dosing for now.   Results reviewed and written information provided.   Total time in face-to-face counseling 15 minutes.   F/U Clinic Visit with Dr. Wynetta Emery in March.  Patient seen with Reino Bellis, PharmD Candidate

## 2017-06-10 NOTE — Patient Instructions (Signed)
Thanks for coming to see [place]  I have increased the lisinopril prescription dose to reflect what you are taking at home.   Follow up with cardiology for the furosemide.

## 2017-06-15 ENCOUNTER — Telehealth: Payer: Self-pay | Admitting: Internal Medicine

## 2017-06-15 ENCOUNTER — Telehealth: Payer: Self-pay

## 2017-06-15 MED FILL — $BRILINTA 90 MG TABLET: 90 | 30 days supply | Qty: 60 | Fill #3

## 2017-06-15 NOTE — Telephone Encounter (Signed)
Returned pt call and made aware of xray results. Pt doesn't have any questions or concerns

## 2017-06-15 NOTE — Telephone Encounter (Signed)
Contacted pt to go over xray results pt didn't answer and was unable to lvm  If pt calls back please give results: x-rays revealed significant OA of both hips. Once approved for [facility] discount, we can refer to orthopedics.

## 2017-06-15 NOTE — Telephone Encounter (Signed)
Pt. Returned nurse call regarding x-ray results. Please f/u

## 2017-06-29 ENCOUNTER — Ambulatory Visit: Payer: Medicaid Other | Attending: Internal Medicine | Admitting: Internal Medicine

## 2017-06-29 ENCOUNTER — Encounter: Payer: Self-pay | Admitting: Internal Medicine

## 2017-06-29 VITALS — BP 122/79 | HR 60 | Temp 98.3°F | Resp 16 | Ht 73.0 in | Wt 217.6 lb

## 2017-06-29 DIAGNOSIS — R7303 Prediabetes: Secondary | ICD-10-CM | POA: Insufficient documentation

## 2017-06-29 DIAGNOSIS — Z87891 Personal history of nicotine dependence: Secondary | ICD-10-CM | POA: Insufficient documentation

## 2017-06-29 DIAGNOSIS — E669 Obesity, unspecified: Secondary | ICD-10-CM | POA: Insufficient documentation

## 2017-06-29 DIAGNOSIS — I10 Essential (primary) hypertension: Secondary | ICD-10-CM

## 2017-06-29 DIAGNOSIS — M1611 Unilateral primary osteoarthritis, right hip: Secondary | ICD-10-CM | POA: Diagnosis not present

## 2017-06-29 DIAGNOSIS — I080 Rheumatic disorders of both mitral and aortic valves: Secondary | ICD-10-CM | POA: Diagnosis not present

## 2017-06-29 DIAGNOSIS — F1290 Cannabis use, unspecified, uncomplicated: Secondary | ICD-10-CM | POA: Insufficient documentation

## 2017-06-29 DIAGNOSIS — Z8249 Family history of ischemic heart disease and other diseases of the circulatory system: Secondary | ICD-10-CM | POA: Diagnosis not present

## 2017-06-29 DIAGNOSIS — R001 Bradycardia, unspecified: Secondary | ICD-10-CM | POA: Insufficient documentation

## 2017-06-29 DIAGNOSIS — Z79899 Other long term (current) drug therapy: Secondary | ICD-10-CM | POA: Insufficient documentation

## 2017-06-29 DIAGNOSIS — I2510 Atherosclerotic heart disease of native coronary artery without angina pectoris: Secondary | ICD-10-CM | POA: Insufficient documentation

## 2017-06-29 DIAGNOSIS — I5032 Chronic diastolic (congestive) heart failure: Secondary | ICD-10-CM | POA: Diagnosis present

## 2017-06-29 DIAGNOSIS — I350 Nonrheumatic aortic (valve) stenosis: Secondary | ICD-10-CM

## 2017-06-29 DIAGNOSIS — Z955 Presence of coronary angioplasty implant and graft: Secondary | ICD-10-CM | POA: Insufficient documentation

## 2017-06-29 DIAGNOSIS — G4734 Idiopathic sleep related nonobstructive alveolar hypoventilation: Secondary | ICD-10-CM | POA: Diagnosis not present

## 2017-06-29 DIAGNOSIS — Z7982 Long term (current) use of aspirin: Secondary | ICD-10-CM | POA: Insufficient documentation

## 2017-06-29 DIAGNOSIS — M160 Bilateral primary osteoarthritis of hip: Secondary | ICD-10-CM

## 2017-06-29 DIAGNOSIS — I255 Ischemic cardiomyopathy: Secondary | ICD-10-CM | POA: Diagnosis not present

## 2017-06-29 DIAGNOSIS — I110 Hypertensive heart disease with heart failure: Secondary | ICD-10-CM | POA: Insufficient documentation

## 2017-06-29 MED ORDER — LIDOCAINE 5 % EX PTCH: 1.0000 | MEDICATED_PATCH | CUTANEOUS | 3 refills | Status: DC

## 2017-06-29 NOTE — Patient Instructions (Signed)
Stop tramadol.  Continue the lidocaine patches and Tylenol as needed.  Please apply for the [facility] discount card so that we can refer you to orthopedics.  I have submitted a referral for you to have the overnight additional sleep test to check your oxygen level.

## 2017-06-29 NOTE — Progress Notes (Signed)
Patient ID: Joshua [name], male    DOB: 06-02-1960  MRN: [id number]  CC: Hypertension   Subjective: Joshua [name] is a 57 y.o. male who presents for chronic ds management His concerns today include:  Patient with history of HTN, OA BL hips, prediabetes, CAD [with stent to LAD, RCA], EF 25-30%, bradycardia , mod AS  1.  CAD/ICM:  Saw Dr. Sallyanne Kuster 04/2017. No CP/SOB/LE edema wgh down 6 lbs since 04/2017.  Limits salt. pt compliant with furosemide,  Brilinta, lisinopril and aspirin  2. OA: -took Tramadol 2 x since last visit.  He does not want to take an opioid unless he has to take.  Getting by okay with Lidocaine patches and Tylenol 500 mg 2 tabs BID OTC.  Not able to do much walking. -x-rays revealed OA BL hips right greater than left. -does not qualify for OC because he lives in [place].  He has not applied for [facility] discount  3.  sleep study was negative for OSA.  However did reveal some hypoxia.  Sleep specialist recommend that he have nocturnal oximetry done to see whether he would qualify for O2 at nights.  I have submitted this referral but patient states he has not been called.  I will resubmitted.  However patient does not have insurance so even if he does need nocturnal O2 he has no way of getting it   Patient Active Problem List   Diagnosis Date Noted  . Chronic diastolic heart failure ([place]) 04/27/2017  . Primary osteoarthritis of right hip 03/31/2017  . Coronary artery disease involving native coronary artery of native heart without angina pectoris 03/31/2017  . Ischemic cardiomyopathy 03/31/2017  . Marijuana use 03/31/2017  . Prediabetes 03/31/2017  . Aortic valve insufficiency 12/28/2016  . Mitral valve regurgitation 12/28/2016  . Obesity 12/28/2016  . Aortic valve stenosis   . 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 Hypertension      Current Outpatient Medications on File Prior to Visit  Medication Sig  Dispense Refill  . acetaminophen (TYLENOL) 500 MG tablet Take 1,000 mg by mouth 2 (two) times daily as needed.    Marland Kitchen aspirin 81 MG chewable tablet Chew 1 tablet (81 mg total) by mouth daily. 90 tablet 3  . atorvastatin (LIPITOR) 40 MG tablet Take 1 tablet (40 mg total) by mouth daily at 6 PM. 90 tablet 3  . furosemide (LASIX) 40 MG tablet TAKE 1 TABLET BY MOUTH ONCE DAILY 30 tablet 11  . lisinopril (PRINIVIL,ZESTRIL) 10 MG tablet Take 1 tablet (10 mg total) by mouth 2 (two) times daily. 60 tablet 2  . potassium chloride (K-DUR,KLOR-CON) 10 MEQ tablet Take 1 tablet (10 mEq total) by mouth daily. 30 tablet 5  . ticagrelor (BRILINTA) 90 MG TABS tablet Take 1 tablet (90 mg total) by mouth 2 (two) times daily. 180 tablet 3   No current facility-administered medications on file prior to visit.     No Known Allergies  Social History   Socioeconomic History  . Marital status: Single    Spouse name: Not on file  . Number of children: Not on file  . Years of education: Not on file  . Highest education level: Not on file  Social Needs  . Financial resource strain: Not on file  . Food insecurity - worry: Not on file  . Food insecurity - inability: Not on file  . Transportation needs - medical: Not on file  .  Transportation needs - non-medical: Not on file  Occupational History  . Not on file  Tobacco Use  . Smoking status: Former Research scientist (life sciences)  . Smokeless tobacco: Never Used  . Tobacco comment: Smoked lightly for approx 20 years, quit 1990s  Substance and Sexual Activity  . Alcohol use: Yes    Comment: 1-2 mixed drinks per week  . Drug use: Yes    Types: Marijuana  . Sexual activity: Not on file  Other Topics Concern  . Not on file  Social History Narrative   Lives Home alone. Sister makes all health related decisions. Declined Advanced directive at this time    Family History  Problem Relation Age of Onset  . CAD Father        MI/CABG age 61    Past Surgical History:  Procedure  Laterality Date  . CORONARY STENT INTERVENTION N/A 12/29/2016   Procedure: CORONARY STENT INTERVENTION;  Surgeon: Jettie Booze, MD;  Location: [facility];  Service: Cardiovascular;  Laterality: N/A;  . RIGHT/LEFT HEART CATH AND CORONARY ANGIOGRAPHY N/A 12/29/2016   Procedure: RIGHT/LEFT HEART CATH AND CORONARY ANGIOGRAPHY;  Surgeon: Jettie Booze, MD;  Location: [facility];  Service: Cardiovascular;  Laterality: N/A;    ROS: Review of Systems Negative except as stated above PHYSICAL EXAM: BP 122/79   Pulse 60   Temp 98.3 F (36.8 C) (Oral)   Resp 16   Ht 6\' 1"  (1.854 m)   Wt 217 lb 9.6 oz (98.7 kg)   SpO2 97%   BMI 28.71 kg/m    Wt Readings from Last 3 Encounters:  06/29/17 217 lb 9.6 oz (98.7 kg)  04/27/17 223 lb 9.6 oz (101.4 kg)  04/10/17 220 lb (99.8 kg)    Physical Exam  General appearance - alert, well appearing, and in no distress Mental status - alert, oriented to person, place, and time Neck - supple, no significant adenopathy Chest - clear to auscultation, no wheezes, rales or rhonchi, symmetric air entry Heart - RRR, 3/6 SEM best along lower LTSB  Musculoskeletal -walks with a limp Extremities - peripheral pulses normal, no pedal edema, no clubbing or cyanosis   ASSESSMENT AND PLAN: 1. Primary osteoarthritis of both hips Continue over-the-counter Tylenol and lidocaine patch as needed. Discontinue tramadol - lidocaine (LIDODERM) 5 %; Place 1 patch onto the skin daily. Remove & Discard patch within 12 hours or as directed by MD  Dispense: 30 patch; Refill: 3  2. Nocturnal hypoxemia - Pulse oximetry, overnight; Future  3. Coronary artery disease involving native coronary artery of native heart without angina pectoris Stable.  Continue current medications and follow-up with cardiology  4. Hypertension, unspecified type Well controlled.  5. Nonrheumatic aortic valve stenosis Followed by cardiology   Patient was given the  opportunity to ask questions.  Patient verbalized understanding of the plan and was able to repeat key elements of the plan.   Orders Placed This Encounter  Procedures  . Pulse oximetry, overnight     Requested Prescriptions   Signed Prescriptions Disp Refills  . lidocaine (LIDODERM) 5 % 30 patch 3    Sig: Place 1 patch onto the skin daily. Remove & Discard patch within 12 hours or as directed by MD    Return in about 3 months (around 09/29/2017).  Karle Plumber, MD, FACP

## 2017-07-07 MED FILL — ?FUROSEMIDE 40 MG TABLET: 40 | 30 days supply | Qty: 30 | Fill #2

## 2017-07-07 MED FILL — ?ATORVASTATIN 40MG TABLET: 40 | 30 days supply | Qty: 30 | Fill #2

## 2017-07-07 MED FILL — POTASSIUM CL 10 MEQ TAB SA: 10 | 30 days supply | Qty: 30 | Fill #3

## 2017-07-07 MED FILL — LISINOPRIL 10 MG TABS: 10 | 30 days supply | Qty: 60 | Fill #1

## 2017-07-14 ENCOUNTER — Telehealth: Payer: Self-pay | Admitting: Internal Medicine

## 2017-07-14 DIAGNOSIS — M160 Bilateral primary osteoarthritis of hip: Secondary | ICD-10-CM

## 2017-07-14 MED FILL — $BRILINTA 90 MG TABLET: 90 | 30 days supply | Qty: 60 | Fill #4

## 2017-07-14 NOTE — Telephone Encounter (Signed)
Will forward to pcp

## 2017-07-14 NOTE — Telephone Encounter (Signed)
Pt. Called stating that his PCP was going to refer him out to an orthopedic b/c of the problems he has with his hips. Pt. Also states that he has a knot on the back of his neck and pt. States that his PCP has seen it before.  Pt. States he has Medicaid. Please f/u with pt.

## 2017-07-15 NOTE — Telephone Encounter (Signed)
Tried contacting pt to go over Dr. Wynetta Emery response pt didn't answer and was unable to lvm

## 2017-07-20 ENCOUNTER — Ambulatory Visit: Payer: Medicaid Other | Attending: Internal Medicine

## 2017-07-20 MED FILL — ?LIDOCAINE 5% PATCH: 5 | 30 days supply | Qty: 30 | Fill #0

## 2017-07-20 NOTE — Telephone Encounter (Signed)
Contacted pt to see what kind of fasting labs is he wanting done. Pt didn't answer left a detailed vm and asked pt to give me a call at his earliest convenience

## 2017-07-20 NOTE — Telephone Encounter (Signed)
Pt. Came to the facility requesting an order to be put in for him to have fasting labs done. Please f/u

## 2017-07-23 ENCOUNTER — Ambulatory Visit (INDEPENDENT_AMBULATORY_CARE_PROVIDER_SITE_OTHER): Payer: Medicaid Other | Admitting: Orthopaedic Surgery

## 2017-07-23 ENCOUNTER — Encounter (INDEPENDENT_AMBULATORY_CARE_PROVIDER_SITE_OTHER): Payer: Self-pay | Admitting: Orthopaedic Surgery

## 2017-07-23 ENCOUNTER — Ambulatory Visit (INDEPENDENT_AMBULATORY_CARE_PROVIDER_SITE_OTHER): Payer: Medicaid Other

## 2017-07-23 DIAGNOSIS — M25551 Pain in right hip: Secondary | ICD-10-CM

## 2017-07-23 DIAGNOSIS — M25561 Pain in right knee: Secondary | ICD-10-CM

## 2017-07-23 NOTE — Progress Notes (Signed)
Office Visit Note   Patient: Joshua [name]           Date of Birth: [birth year]           MRN: [id number] Visit Date: 07/23/2017              Requested by: Ladell Pier, MD [address] [place], [place] [address] PCP: Ladell Pier, MD   Assessment & Plan: Visit Diagnoses:  1. Right knee pain, unspecified chronicity   2. Pain in right hip     Plan: Although Joshua [name] may have a radicular component to his right lower extremity pain, believe the majority of his pain is coming from his right hip arthritis.  We will refer him to Dr. Ernestina Patches for an intra-articular cortisone injection.  We are cautiously  optimistic with the relief he will get as his arthritis is severe.  We have discussed definitive treatment of a total hip replacement.  A handout was given to him today.  In regards to the knee, he does not want to proceed with a cortisone injection today.  He would like to wait until after his hip injection to see how his knee feels.  He will call with concerns or questions in the meantime.  Follow-Up Instructions: Return if symptoms worsen or fail to improve.   Orders:  Orders Placed This Encounter  Procedures  . XR KNEE 3 VIEW RIGHT  . Ambulatory referral to Physical Medicine Rehab   No orders of the defined types were placed in this encounter.     Procedures: No procedures performed   Clinical Data: No additional findings.   Subjective: Chief Complaint  Patient presents with  . Right Hip - Pain  . Left Hip - Pain    HPI patient is a pleasant 57 year old gentleman who presents our clinic today with right hip and right knee pain.  In regards to the right hip is having pain primarily to the groin but this does occasionally occur in his right buttocks as well as the lateral hip.  This is intermittent in nature.  Worse with walking and increased activity.  He has recently started to use a cane as he has trouble going from seated to standing positions.  He is  status post recent stent and is on Brilinta for this.  He has been taking Tylenol but is unable to take NSAIDs due to the Brilinta.  He also notes occasional burning down the entire right leg.  He was previously seen by his primary care where x-rays were obtained of the right hip.  This showed severe degenerative joint disease.  No history of lumbar pathology.  Regards to the right knee, this started about 4 months ago.  The pain is primarily to the medial aspect.  No locking or catching.  Pain is aggravated with extension of the knee.  No previous knee injury or surgical intervention.  Review of Systems as detailed in HPI.  All others reviewed and are negative.   Objective: Vital Signs: There were no vitals taken for this visit.  Physical Exam well-developed well-nourished gentleman no acute distress.  Alert and oriented x3.  Ortho Exam examination of the right hip reveals markedly positive logroll.  Negative straight leg raise.  Decreased strength with resisted hip flexion.  Examination of the right knee shows no effusion.  Range of motion 0-115 degrees.  Medial joint line tenderness.  Minimal tell femoral crepitus.  Ligaments are stable.  Specialty Comments:  No specialty comments  available.  Imaging: Xr Knee 3 View Right  Result Date: 07/23/2017 Minimal degenerative changes throughout    PMFS History: Patient Active Problem List   Diagnosis Date Noted  . Right knee pain 07/23/2017  . Pain in right hip 07/23/2017  . Nocturnal hypoxemia 06/29/2017  . Primary osteoarthritis of both hips 06/29/2017  . Chronic diastolic heart failure ([place]) 04/27/2017  . Coronary artery disease involving native coronary artery of native heart without angina pectoris 03/31/2017  . Ischemic cardiomyopathy 03/31/2017  . Marijuana use 03/31/2017  . Prediabetes 03/31/2017  . Aortic valve insufficiency 12/28/2016  . Mitral valve regurgitation 12/28/2016  . Obesity 12/28/2016  . Aortic valve stenosis   .  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 Hypertension    Past Medical History:  Diagnosis Date  . CAD (coronary artery disease), native coronary artery 12/26/2016   DES LAD & RCA, EF 25-30%  . Hypertension   . Obesity   . Osteoarthritis   . Pre-diabetes    a. prior A1C 5.6, states he was on meds for this at one point.    Family History  Problem Relation Age of Onset  . CAD Father        MI/CABG age 21    Past Surgical History:  Procedure Laterality Date  . CORONARY STENT INTERVENTION N/A 12/29/2016   Procedure: CORONARY STENT INTERVENTION;  Surgeon: Jettie Booze, MD;  Location: [facility];  Service: Cardiovascular;  Laterality: N/A;  . RIGHT/LEFT HEART CATH AND CORONARY ANGIOGRAPHY N/A 12/29/2016   Procedure: RIGHT/LEFT HEART CATH AND CORONARY ANGIOGRAPHY;  Surgeon: Jettie Booze, MD;  Location: [facility];  Service: Cardiovascular;  Laterality: N/A;   Social History   Occupational History  . Not on file  Tobacco Use  . Smoking status: Former Research scientist (life sciences)  . Smokeless tobacco: Never Used  . Tobacco comment: Smoked lightly for approx 20 years, quit 1990s  Substance and Sexual Activity  . Alcohol use: Yes    Comment: 1-2 mixed drinks per week  . Drug use: Yes    Types: Marijuana  . Sexual activity: Not on file

## 2017-08-10 MED FILL — LISINOPRIL 10 MG TABS: 10 | 30 days supply | Qty: 60 | Fill #2

## 2017-08-17 DIAGNOSIS — I10 Essential (primary) hypertension: Secondary | ICD-10-CM

## 2017-08-17 MED FILL — $BRILINTA 90 MG TABLET: 90 | 30 days supply | Qty: 60 | Fill #5

## 2017-08-20 ENCOUNTER — Ambulatory Visit (INDEPENDENT_AMBULATORY_CARE_PROVIDER_SITE_OTHER): Payer: Medicaid Other | Admitting: Physical Medicine and Rehabilitation

## 2017-08-20 ENCOUNTER — Encounter (INDEPENDENT_AMBULATORY_CARE_PROVIDER_SITE_OTHER): Payer: Self-pay | Admitting: Physical Medicine and Rehabilitation

## 2017-08-20 ENCOUNTER — Ambulatory Visit (INDEPENDENT_AMBULATORY_CARE_PROVIDER_SITE_OTHER): Payer: Self-pay

## 2017-08-20 DIAGNOSIS — M25551 Pain in right hip: Secondary | ICD-10-CM | POA: Diagnosis not present

## 2017-08-20 NOTE — Patient Instructions (Signed)

## 2017-08-20 NOTE — Progress Notes (Signed)
Joshua [name] - 57 y.o. male MRN [id number]  Date of birth: [birth year]  Office Visit Note: Visit Date: 08/20/2017 PCP: Ladell Pier, MD Referred by: Ladell Pier, MD  Subjective: Chief Complaint  Patient presents with  . Right Hip - Pain   HPI: Mr. Joshua [name] is a 57 year old gentleman who comes in today at the request of Dr. Eduard Roux for diagnostic and hopefully therapeutic right intra-articular anesthetic hip arthrogram.  Patient's history is complicated by significant coronary artery disease and congestive heart failure.  He has known severe hip osteoarthritis which is pretty much end-stage.  His biggest complaint really is knee pain but with minimal degenerative changes of the knee.   ROS Otherwise per HPI.  Assessment & Plan: Visit Diagnoses:  1. Pain in right hip     Plan: Findings:  Diagnostic note for therapeutic anesthetic hip arthrogram on the right.  Patient did have some relief of his knee pain during the anesthetic phase which was fairly dramatic.  He still had difficulty ambulating from a mechanical standpoint.    Meds & Orders: No orders of the defined types were placed in this encounter.  No orders of the defined types were placed in this encounter.   Follow-up: No follow-ups on file.   Procedures: Large Joint Inj: R hip joint on 08/20/2017 3:58 PM Indications: pain and diagnostic evaluation Details: 22 G needle, anterior approach  Arthrogram: Yes  Medications: 80 mg triamcinolone acetonide 40 MG/ML; 3 mL bupivacaine 0.5 % Outcome: tolerated well, no immediate complications  Arthrogram demonstrated excellent flow of contrast throughout the joint surface without extravasation or obvious defect.  The patient had relief of symptoms during the anesthetic phase of the injection.  Procedure, treatment alternatives, risks and benefits explained, specific risks discussed. Consent was given by the patient. Immediately prior to procedure a time out  was called to verify the correct patient, procedure, equipment, support staff and site/side marked as required. Patient was prepped and draped in the usual sterile fashion.      No notes on file   Clinical History: No specialty comments available.   He reports that he has quit smoking. He has never used smokeless tobacco.  Recent Labs    12/27/16 0238  HGBA1C 5.8*    Objective:  VS:  HT:    WT:   BMI:     BP:   HR: bpm  TEMP: ( )  RESP:  Physical Exam  Musculoskeletal:  Significant lack of motion with right hip rotation and concordant pain with rotation of the right hip.    Ortho Exam Imaging: No results found.  Past Medical/Family/Surgical/Social History: Medications & Allergies reviewed per EMR, new medications updated. Patient Active Problem List   Diagnosis Date Noted  . Right knee pain 07/23/2017  . Pain in right hip 07/23/2017  . Nocturnal hypoxemia 06/29/2017  . Primary osteoarthritis of both hips 06/29/2017  . Chronic diastolic heart failure ([place]) 04/27/2017  . Coronary artery disease involving native coronary artery of native heart without angina pectoris 03/31/2017  . Ischemic cardiomyopathy 03/31/2017  . Marijuana use 03/31/2017  . Prediabetes 03/31/2017  . Aortic valve insufficiency 12/28/2016  . Mitral valve regurgitation 12/28/2016  . Obesity 12/28/2016  . Aortic valve stenosis   . 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 Hypertension    Past Medical History:  Diagnosis Date  . CAD (coronary artery disease), native coronary artery 12/26/2016   DES LAD & RCA,  EF 25-30%  . Hypertension   . Obesity   . Osteoarthritis   . Pre-diabetes    a. prior A1C 5.6, states he was on meds for this at one point.   Family History  Problem Relation Age of Onset  . CAD Father        MI/CABG age 33   Past Surgical History:  Procedure Laterality Date  . CORONARY STENT INTERVENTION N/A 12/29/2016   Procedure:  CORONARY STENT INTERVENTION;  Surgeon: Jettie Booze, MD;  Location: [facility];  Service: Cardiovascular;  Laterality: N/A;  . RIGHT/LEFT HEART CATH AND CORONARY ANGIOGRAPHY N/A 12/29/2016   Procedure: RIGHT/LEFT HEART CATH AND CORONARY ANGIOGRAPHY;  Surgeon: Jettie Booze, MD;  Location: [facility];  Service: Cardiovascular;  Laterality: N/A;   Social History   Occupational History  . Not on file  Tobacco Use  . Smoking status: Former Research scientist (life sciences)  . Smokeless tobacco: Never Used  . Tobacco comment: Smoked lightly for approx 20 years, quit 1990s  Substance and Sexual Activity  . Alcohol use: Yes    Comment: 1-2 mixed drinks per week  . Drug use: Yes    Types: Marijuana  . Sexual activity: Not on file

## 2017-09-03 MED ORDER — TRIAMCINOLONE ACETONIDE 40 MG/ML IJ SUSP
80.0000 mg | INTRAMUSCULAR | Status: AC | PRN
  Administered 2017-08-20: 80 mg via INTRA_ARTICULAR

## 2017-09-03 MED ORDER — BUPIVACAINE HCL 0.5 % IJ SOLN
3.0000 mL | INTRAMUSCULAR | Status: AC | PRN
  Administered 2017-08-20: 3 mL via INTRA_ARTICULAR

## 2017-09-08 MED FILL — ?LIDOCAINE 5% PATCH: 5 | 30 days supply | Qty: 30 | Fill #1

## 2017-09-08 MED FILL — LISINOPRIL 10 MG TABS: 10 | 30 days supply | Qty: 30 | Fill #0

## 2017-09-15 MED FILL — POTASSIUM CL 10 MEQ TAB SA: 10 | 30 days supply | Qty: 30 | Fill #4

## 2017-09-15 MED FILL — $BRILINTA 90 MG TABLET: 90 | 30 days supply | Qty: 60 | Fill #6

## 2017-09-28 ENCOUNTER — Other Ambulatory Visit: Payer: Self-pay | Admitting: Internal Medicine

## 2017-09-28 MED FILL — LISINOPRIL 10 MG TABS: 10 | 30 days supply | Qty: 60 | Fill #0

## 2017-09-29 MED FILL — ATORVASTATIN CALCIUM 40 MG: 40 | 30 days supply | Qty: 30 | Fill #3

## 2017-10-01 ENCOUNTER — Encounter: Payer: Self-pay | Admitting: Internal Medicine

## 2017-10-01 ENCOUNTER — Ambulatory Visit: Payer: Medicaid Other | Attending: Internal Medicine | Admitting: Internal Medicine

## 2017-10-01 VITALS — BP 123/74 | HR 64 | Temp 97.4°F | Resp 16 | Wt 222.4 lb

## 2017-10-01 DIAGNOSIS — Z955 Presence of coronary angioplasty implant and graft: Secondary | ICD-10-CM | POA: Diagnosis not present

## 2017-10-01 DIAGNOSIS — Z79899 Other long term (current) drug therapy: Secondary | ICD-10-CM | POA: Diagnosis not present

## 2017-10-01 DIAGNOSIS — R7303 Prediabetes: Secondary | ICD-10-CM | POA: Diagnosis present

## 2017-10-01 DIAGNOSIS — Z8249 Family history of ischemic heart disease and other diseases of the circulatory system: Secondary | ICD-10-CM | POA: Insufficient documentation

## 2017-10-01 DIAGNOSIS — R001 Bradycardia, unspecified: Secondary | ICD-10-CM | POA: Diagnosis not present

## 2017-10-01 DIAGNOSIS — F1290 Cannabis use, unspecified, uncomplicated: Secondary | ICD-10-CM | POA: Insufficient documentation

## 2017-10-01 DIAGNOSIS — Z87891 Personal history of nicotine dependence: Secondary | ICD-10-CM | POA: Insufficient documentation

## 2017-10-01 DIAGNOSIS — Z7982 Long term (current) use of aspirin: Secondary | ICD-10-CM | POA: Diagnosis not present

## 2017-10-01 DIAGNOSIS — G8929 Other chronic pain: Secondary | ICD-10-CM | POA: Diagnosis not present

## 2017-10-01 DIAGNOSIS — I350 Nonrheumatic aortic (valve) stenosis: Secondary | ICD-10-CM | POA: Insufficient documentation

## 2017-10-01 DIAGNOSIS — I2510 Atherosclerotic heart disease of native coronary artery without angina pectoris: Secondary | ICD-10-CM | POA: Insufficient documentation

## 2017-10-01 DIAGNOSIS — Z9189 Other specified personal risk factors, not elsewhere classified: Secondary | ICD-10-CM | POA: Diagnosis not present

## 2017-10-01 DIAGNOSIS — M25561 Pain in right knee: Secondary | ICD-10-CM | POA: Diagnosis not present

## 2017-10-01 DIAGNOSIS — E669 Obesity, unspecified: Secondary | ICD-10-CM | POA: Diagnosis not present

## 2017-10-01 DIAGNOSIS — M160 Bilateral primary osteoarthritis of hip: Secondary | ICD-10-CM

## 2017-10-01 DIAGNOSIS — I5032 Chronic diastolic (congestive) heart failure: Secondary | ICD-10-CM | POA: Diagnosis present

## 2017-10-01 DIAGNOSIS — G4734 Idiopathic sleep related nonobstructive alveolar hypoventilation: Secondary | ICD-10-CM | POA: Diagnosis not present

## 2017-10-01 DIAGNOSIS — I255 Ischemic cardiomyopathy: Secondary | ICD-10-CM | POA: Insufficient documentation

## 2017-10-01 DIAGNOSIS — I110 Hypertensive heart disease with heart failure: Secondary | ICD-10-CM | POA: Diagnosis present

## 2017-10-01 DIAGNOSIS — H538 Other visual disturbances: Secondary | ICD-10-CM | POA: Insufficient documentation

## 2017-10-01 NOTE — Progress Notes (Signed)
Patient ID: Joshua [name], male    DOB: 12/09/1960  MRN: [id number]  CC: Referral (eye and dentist )   Subjective: Joshua [name] is a 57 y.o. male who presents for chronic ds management His concerns today include:  Patient with history of HTN, OA BL hips, prediabetes, CAD [with stent to LAD, RCA],EF 25-30%,bradycardia , mod AS  He was approved for disability and Medicaid.  He is nervous that they will call him and say they made a mistake.  Request referral for routine dental care.  C/o problems seeing things close up and far away.  Use to wear glasses in past.  Needs comprehensive eye exam.  CAD/ICM:  Wants f/u with [facility] in 11/2017 -no CP/SOB/LE edema -compliant with meds  OA: saw Dr. Ernestina Patches last mth and had inj to RT hip.  Reports good relief.  He has not had to use the lidocaine patch as much on the right hip since then, but now having to use it on the right knee.  He thinks poor chemical gait from chronic hip pain is now causing the right knee to act up.  Denies any swelling in the knee.  "I created the problem pushing myself walking.  Would like to do water aerobic.  He has discussed total hip replacement with his orthopedics but he is afraid to move forward.   Sleep study was negative for OSA.  However did reveal some hypoxia.  Sleep specialist recommend that he have nocturnal oximetry done to see whether he would qualify for O2 at nights.  I have submitted this referral but patient states he has not been called.   . Patient Active Problem List   Diagnosis Date Noted  . Right knee pain 07/23/2017  . Pain in right hip 07/23/2017  . Nocturnal hypoxemia 06/29/2017  . Primary osteoarthritis of both hips 06/29/2017  . Chronic diastolic heart failure ([place]) 04/27/2017  . Coronary artery disease involving native coronary artery of native heart without angina pectoris 03/31/2017  . Ischemic cardiomyopathy 03/31/2017  . Marijuana use 03/31/2017  . Prediabetes  03/31/2017  . Aortic valve insufficiency 12/28/2016  . Mitral valve regurgitation 12/28/2016  . Obesity 12/28/2016  . Aortic valve stenosis   . 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 Hypertension      Current Outpatient Medications on File Prior to Visit  Medication Sig Dispense Refill  . acetaminophen (TYLENOL) 500 MG tablet Take 1,000 mg by mouth 2 (two) times daily as needed.    Marland Kitchen aspirin 81 MG chewable tablet Chew 1 tablet (81 mg total) by mouth daily. 90 tablet 3  . atorvastatin (LIPITOR) 40 MG tablet Take 1 tablet (40 mg total) by mouth daily at 6 PM. 90 tablet 3  . furosemide (LASIX) 40 MG tablet TAKE 1 TABLET BY MOUTH ONCE DAILY 30 tablet 11  . lidocaine (LIDODERM) 5 % Place 1 patch onto the skin daily. Remove & Discard patch within 12 hours or as directed by MD 30 patch 3  . lisinopril (PRINIVIL,ZESTRIL) 10 MG tablet TAKE 1 TABLET BY MOUTH 2 TIMES DAILY. 60 tablet 2  . potassium chloride (K-DUR,KLOR-CON) 10 MEQ tablet Take 1 tablet (10 mEq total) by mouth daily. 30 tablet 5  . ticagrelor (BRILINTA) 90 MG TABS tablet Take 1 tablet (90 mg total) by mouth 2 (two) times daily. 180 tablet 3   No current facility-administered medications on file prior to visit.     No Known Allergies  Social History   Socioeconomic History  . Marital status: Single    Spouse name: Not on file  . Number of children: Not on file  . Years of education: Not on file  . Highest education level: Not on file  Occupational History  . Not on file  Social Needs  . Financial resource strain: Not on file  . Food insecurity:    Worry: Not on file    Inability: Not on file  . Transportation needs:    Medical: Not on file    Non-medical: Not on file  Tobacco Use  . Smoking status: Former Research scientist (life sciences)  . Smokeless tobacco: Never Used  . Tobacco comment: Smoked lightly for approx 20 years, quit 1990s  Substance and Sexual Activity  . Alcohol use: Yes    Comment:  1-2 mixed drinks per week  . Drug use: Yes    Types: Marijuana  . Sexual activity: Not on file  Lifestyle  . Physical activity:    Days per week: Not on file    Minutes per session: Not on file  . Stress: Not on file  Relationships  . Social connections:    Talks on phone: Not on file    Gets together: Not on file    Attends religious service: Not on file    Active member of club or organization: Not on file    Attends meetings of clubs or organizations: Not on file    Relationship status: Not on file  . Intimate partner violence:    Fear of current or ex partner: Not on file    Emotionally abused: Not on file    Physically abused: Not on file    Forced sexual activity: Not on file  Other Topics Concern  . Not on file  Social History Narrative   Lives Home alone. Sister makes all health related decisions. Declined Advanced directive at this time    Family History  Problem Relation Age of Onset  . CAD Father        MI/CABG age 60    Past Surgical History:  Procedure Laterality Date  . CORONARY STENT INTERVENTION N/A 12/29/2016   Procedure: CORONARY STENT INTERVENTION;  Surgeon: Jettie Booze, MD;  Location: [facility];  Service: Cardiovascular;  Laterality: N/A;  . RIGHT/LEFT HEART CATH AND CORONARY ANGIOGRAPHY N/A 12/29/2016   Procedure: RIGHT/LEFT HEART CATH AND CORONARY ANGIOGRAPHY;  Surgeon: Jettie Booze, MD;  Location: [facility];  Service: Cardiovascular;  Laterality: N/A;    ROS: Review of Systems Negative except as stated above PHYSICAL EXAM: BP 123/74   Pulse 64   Temp (!) 97.4 F (36.3 C) (Oral)   Resp 16   Wt 222 lb 6.4 oz (100.9 kg)   SpO2 98%   BMI 29.34 kg/m   Wt Readings from Last 3 Encounters:  10/01/17 222 lb 6.4 oz (100.9 kg)  06/29/17 217 lb 9.6 oz (98.7 kg)  04/27/17 223 lb 9.6 oz (101.4 kg)    Physical Exam General appearance - alert, well appearing, and in no distress Mental status - normal mood,  behavior, speech, dress, motor activity, and thought processes Mouth - mucous membranes moist, pharynx normal without lesions Neck - supple, no significant adenopathy Chest - clear to auscultation, no wheezes, rales or rhonchi, symmetric air entry Heart -RRR, 3/6 SEM best along lower LTSB  Extremities - peripheral pulses normal, no pedal edema, no clubbing or cyanosis MSK: Right knee -no edema or erythema.  No point tenderness.  Good range of motion.  Results for orders placed or performed in visit on 52/84/13  Basic Metabolic Panel  Result Value Ref Range   Glucose 140 (H) 65 - 99 mg/dL   BUN 11 6 - 24 mg/dL   Creatinine, Ser 0.93 0.76 - 1.27 mg/dL   GFR calc non Af Amer 90 >59 mL/min/1.73   GFR calc Af Amer 104 >59 mL/min/1.73   BUN/Creatinine Ratio 12 9 - 20   Sodium 141 134 - 144 mmol/L   Potassium 4.3 3.5 - 5.2 mmol/L   Chloride 99 96 - 106 mmol/L   CO2 25 20 - 29 mmol/L   Calcium 10.0 8.7 - 10.2 mg/dL    ASSESSMENT AND PLAN: 1. Osteoarthritis of both hips, unspecified osteoarthritis type 2. Chronic pain of right knee -Patient afraid of considering knee replacement for his hip.  However he wants to stay active.  He would like to be referred for water aerobics - Ambulatory referral to Physical Therapy  3. Need for dental care - Ambulatory referral to Dentistry  4. Blurred vision - Ambulatory referral to Ophthalmology  5. Coronary artery disease involving native coronary artery of native heart without angina pectoris 6. Ischemic cardiomyopathy Clinically stable.  Continue Brilinta, aspirin, Lipitor, lisinopril and furosemide  7. Nocturnal hypoxemia  will resubmit referral for overnight pulse oximetry.  Hopefully he can get it done now that he has Medicaid - Pulse oximetry, overnight; Future  Patient was given the opportunity to ask questions.  Patient verbalized understanding of the plan and was able to repeat key elements of the plan.   Orders Placed This Encounter    Procedures  . Ambulatory referral to Dentistry  . Ambulatory referral to Ophthalmology  . Ambulatory referral to Physical Therapy  . Pulse oximetry, overnight     Requested Prescriptions    No prescriptions requested or ordered in this encounter    Return in about 3 months (around 01/07/2018).  Karle Plumber, MD, FACP

## 2017-10-19 MED FILL — ?LIDOCAINE 5% PATCH: 5 | 30 days supply | Qty: 30 | Fill #2

## 2017-10-19 MED FILL — $BRILINTA 90 MG TABLET: 90 | 30 days supply | Qty: 60 | Fill #7

## 2017-10-19 MED FILL — POTASSIUM CL 10 MEQ TAB SA: 10 | 30 days supply | Qty: 30 | Fill #5

## 2017-10-27 MED FILL — ATORVASTATIN 40 MG TABLET: 40 | 30 days supply | Qty: 30 | Fill #4

## 2017-10-27 MED FILL — LISINOPRIL 10 MG TABS: 10 | 30 days supply | Qty: 60 | Fill #1

## 2017-10-30 ENCOUNTER — Telehealth (INDEPENDENT_AMBULATORY_CARE_PROVIDER_SITE_OTHER): Payer: Self-pay | Admitting: *Deleted

## 2017-11-02 NOTE — Telephone Encounter (Signed)
Yes okay that he would need follow-up with Dr. Erlinda Hong 2 weeks after injection and we can go ahead and make that appointment.

## 2017-11-02 NOTE — Telephone Encounter (Signed)
Called pt and left vm #1 on home and mobile 

## 2017-11-09 NOTE — Telephone Encounter (Signed)
Called pt and left vm #2.  

## 2017-11-11 NOTE — Telephone Encounter (Signed)
Pt called and left vm #3

## 2017-11-13 NOTE — Telephone Encounter (Signed)
Patient called back and left message. I called him back and left message #4.

## 2017-11-18 ENCOUNTER — Other Ambulatory Visit: Payer: Self-pay | Admitting: Internal Medicine

## 2017-11-18 MED FILL — BRILINTA 90 MG TABLET: 90 | 30 days supply | Qty: 60 | Fill #8

## 2017-11-18 MED FILL — POTASSIUM CL 10 MEQ TAB SA: 10 | 30 days supply | Qty: 30 | Fill #0

## 2017-11-18 NOTE — Telephone Encounter (Signed)
Called pt and left vm #5

## 2017-11-20 ENCOUNTER — Telehealth: Payer: Self-pay | Admitting: Internal Medicine

## 2017-11-20 DIAGNOSIS — M160 Bilateral primary osteoarthritis of hip: Secondary | ICD-10-CM

## 2017-11-20 NOTE — Telephone Encounter (Signed)
Will forward to pcp

## 2017-11-20 NOTE — Telephone Encounter (Signed)
Pt called today to schedule appt, I called him back and left vm #5.

## 2017-11-20 NOTE — Telephone Encounter (Signed)
Pt came in to request a medication refill on -lidocaine (LIDODERM) 5 % To [facility], He also wants an update on receiving O2 at nights. Please follow up

## 2017-11-22 MED ORDER — LIDOCAINE 5 % EX PTCH: 1.0000 | MEDICATED_PATCH | CUTANEOUS | 3 refills | Status: DC

## 2017-11-24 ENCOUNTER — Telehealth: Payer: Self-pay

## 2017-11-24 MED FILL — ATORVASTATIN 40 MG TABLET: 40 | 30 days supply | Qty: 30 | Fill #5

## 2017-11-24 MED FILL — LISINOPRIL 10 MG TABS: 10 | 30 days supply | Qty: 60 | Fill #2

## 2017-11-24 NOTE — Telephone Encounter (Signed)
The lidoderm patches arenot covered by medicaid, they want him to try and fail at least two of the following categories: Tri-cyclic antidepressants, SSRI's, SNRI's, anticonvulsants, NSAID's, or COXII's  I know he cannot use NSAID's because of Brilinta but they want him to try and fail another class before he can be approved for the patches. The pharmacy had some short-dated patches for free through [facility] but they are no longer available so they sent the PA request a few days ago. Please advise on how we should move forward.

## 2017-11-27 ENCOUNTER — Telehealth: Payer: Self-pay | Admitting: Internal Medicine

## 2017-11-27 NOTE — Telephone Encounter (Signed)
Pt came and would like to try the medication  -Cymbalta  To  -[facility] Please follow up on this medication and on his O2 at nights

## 2017-11-27 NOTE — Telephone Encounter (Signed)
Phone call placed to patient last evening.  I left a message on the home phone informing him that his insurance will no longer pay for the lidocaine patches until he is tried with another medication. Ibuprofen and Cox 2 inhibitors would not be good options because he is on Brilinta and aspirin.  We can try him with Cymbalta.

## 2017-11-30 MED ORDER — DULOXETINE HCL 30 MG PO CPEP: 30.0000 mg | ORAL_CAPSULE | Freq: Every day | ORAL | 3 refills | Status: DC

## 2017-11-30 MED FILL — DULoxetine HCL 30 MG CPEP: 30 | 30 days supply | Qty: 30 | Fill #0

## 2017-12-04 ENCOUNTER — Telehealth: Payer: Self-pay | Admitting: Internal Medicine

## 2017-12-04 ENCOUNTER — Ambulatory Visit (INDEPENDENT_AMBULATORY_CARE_PROVIDER_SITE_OTHER): Payer: Self-pay

## 2017-12-04 ENCOUNTER — Ambulatory Visit (INDEPENDENT_AMBULATORY_CARE_PROVIDER_SITE_OTHER): Payer: Medicaid Other | Admitting: Physical Medicine and Rehabilitation

## 2017-12-04 DIAGNOSIS — M25551 Pain in right hip: Secondary | ICD-10-CM | POA: Diagnosis not present

## 2017-12-04 NOTE — Telephone Encounter (Signed)
Will forward to pcp

## 2017-12-04 NOTE — Progress Notes (Signed)
Joshua [name] - 57 y.o. male MRN [id number]  Date of birth: 06-27-1960  Office Visit Note: Visit Date: 12/04/2017 PCP: Ladell Pier, MD Referred by: Ladell Pier, MD  Subjective: Chief Complaint  Patient presents with  . Right Hip - Pain   HPI: Joshua [name] is a 57 year old gentleman with chronic worsening right hip pain with prior diagnostic and therapeutic anesthetic arthrogram back in May which gave him quite a bit of relief although it did not take his pain completely away did fairly well.  He has had return of symptoms recently.  He reports pain with daily activities.  He reports 10 out of 10 pain on average.   ROS Otherwise per HPI.  Assessment & Plan: Visit Diagnoses:  1. Pain in right hip     Plan: No additional findings.   Meds & Orders: No orders of the defined types were placed in this encounter.   Orders Placed This Encounter  Procedures  . Large Joint Inj: R hip joint  . XR C-ARM NO REPORT    Follow-up: Return in about 2 weeks (around 12/18/2017) for Dr. Erlinda Hong.   Procedures: Large Joint Inj: R hip joint on 12/04/2017 8:55 AM Indications: diagnostic evaluation and pain Details: 22 G 3.5 in needle, fluoroscopy-guided anterior approach  Arthrogram: No  Medications: 80 mg triamcinolone acetonide 40 MG/ML; 3 mL bupivacaine 0.5 % Outcome: tolerated well, no immediate complications  There was excellent flow of contrast producing a partial arthrogram of the hip. The patient did have relief of symptoms during the anesthetic phase of the injection. Procedure, treatment alternatives, risks and benefits explained, specific risks discussed. Consent was given by the patient. Immediately prior to procedure a time out was called to verify the correct patient, procedure, equipment, support staff and site/side marked as required. Patient was prepped and draped in the usual sterile fashion.      No notes on file   Clinical History: No specialty comments  available.   He reports that he has quit smoking. He has never used smokeless tobacco.  Recent Labs    12/27/16 0238  HGBA1C 5.8*    Objective:  VS:  HT:    WT:   BMI:     BP:   HR: bpm  TEMP: ( )  RESP:  Physical Exam  Ortho Exam Imaging: No results found.  Past Medical/Family/Surgical/Social History: Medications & Allergies reviewed per EMR, new medications updated. Patient Active Problem List   Diagnosis Date Noted  . Right knee pain 07/23/2017  . Pain in right hip 07/23/2017  . Nocturnal hypoxemia 06/29/2017  . Primary osteoarthritis of both hips 06/29/2017  . Chronic diastolic heart failure ([place]) 04/27/2017  . Coronary artery disease involving native coronary artery of native heart without angina pectoris 03/31/2017  . Ischemic cardiomyopathy 03/31/2017  . Marijuana use 03/31/2017  . Prediabetes 03/31/2017  . Aortic valve insufficiency 12/28/2016  . Mitral valve regurgitation 12/28/2016  . Obesity 12/28/2016  . Aortic valve stenosis   . 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 Hypertension    Past Medical History:  Diagnosis Date  . CAD (coronary artery disease), native coronary artery 12/26/2016   DES LAD & RCA, EF 25-30%  . Hypertension   . Obesity   . Osteoarthritis   . Pre-diabetes    a. prior A1C 5.6, states he was on meds for this at one point.   Family History  Problem Relation Age of Onset  .  CAD Father        MI/CABG age 46   Past Surgical History:  Procedure Laterality Date  . CORONARY STENT INTERVENTION N/A 12/29/2016   Procedure: CORONARY STENT INTERVENTION;  Surgeon: Jettie Booze, MD;  Location: [facility];  Service: Cardiovascular;  Laterality: N/A;  . RIGHT/LEFT HEART CATH AND CORONARY ANGIOGRAPHY N/A 12/29/2016   Procedure: RIGHT/LEFT HEART CATH AND CORONARY ANGIOGRAPHY;  Surgeon: Jettie Booze, MD;  Location: [facility];  Service: Cardiovascular;  Laterality: N/A;     Social History   Occupational History  . Not on file  Tobacco Use  . Smoking status: Former Research scientist (life sciences)  . Smokeless tobacco: Never Used  . Tobacco comment: Smoked lightly for approx 20 years, quit 1990s  Substance and Sexual Activity  . Alcohol use: Yes    Comment: 1-2 mixed drinks per week  . Drug use: Yes    Types: Marijuana  . Sexual activity: Not on file

## 2017-12-04 NOTE — Telephone Encounter (Signed)
Patient came in to request an update on his O2 at nights with the sleep specialists, he spoke with his provider about it on 10/01/2017 and never heard anything back, please follow up

## 2017-12-04 NOTE — Telephone Encounter (Signed)
Joshua [name] would you know who does the o2 testing at night

## 2017-12-04 NOTE — Patient Instructions (Signed)

## 2017-12-04 NOTE — Progress Notes (Signed)
  Numeric Pain Rating Scale and Functional Assessment Average Pain 5   In the last MONTH (on 0-10 scale) has pain interfered with the following?  1. General activity like being  able to carry out your everyday physical activities such as walking, climbing stairs, carrying groceries, or moving a chair?  Rating(7)   -Dye Allergies.  

## 2017-12-07 NOTE — Telephone Encounter (Signed)
[facility]

## 2017-12-17 MED FILL — POTASSIUM CL 10 MEQ TAB SA: 10 | 30 days supply | Qty: 30 | Fill #1

## 2017-12-17 MED FILL — BRILINTA 90 MG TABLET: 90 | 30 days supply | Qty: 60 | Fill #9

## 2017-12-18 ENCOUNTER — Ambulatory Visit (INDEPENDENT_AMBULATORY_CARE_PROVIDER_SITE_OTHER): Payer: Medicaid Other | Admitting: Orthopaedic Surgery

## 2017-12-18 DIAGNOSIS — M25561 Pain in right knee: Secondary | ICD-10-CM | POA: Diagnosis not present

## 2017-12-18 DIAGNOSIS — M1611 Unilateral primary osteoarthritis, right hip: Secondary | ICD-10-CM | POA: Diagnosis not present

## 2017-12-18 DIAGNOSIS — G8929 Other chronic pain: Secondary | ICD-10-CM | POA: Diagnosis not present

## 2017-12-18 MED ORDER — TRIAMCINOLONE ACETONIDE 40 MG/ML IJ SUSP
80.0000 mg | INTRAMUSCULAR | Status: AC | PRN
  Administered 2017-12-04: 80 mg via INTRA_ARTICULAR

## 2017-12-18 MED ORDER — BUPIVACAINE HCL 0.5 % IJ SOLN
3.0000 mL | INTRAMUSCULAR | Status: AC | PRN
  Administered 2017-12-04: 3 mL via INTRA_ARTICULAR

## 2017-12-18 NOTE — Progress Notes (Signed)
Office Visit Note   Patient: Joshua [name]           Date of Birth: [birth year]           MRN: [id number] Visit Date: 12/18/2017              Requested by: Ladell Pier, MD [address] [place], [place] [address] PCP: Ladell Pier, MD   Assessment & Plan: Visit Diagnoses:  1. Primary osteoarthritis of right hip   2. Chronic pain of right knee     Plan: Joshua [name] has responded well to cortisone injection in his right hip also helped his right knee pain.  He does have advanced right hip degenerative joint disease.  He understands that he will likely need a total hip replacement in the near future.  He will let us know when he is ready to do this.  Follow-up with [place] as needed.  Follow-Up Instructions: Return if symptoms worsen or fail to improve.   Orders:  No orders of the defined types were placed in this encounter.  No orders of the defined types were placed in this encounter.     Procedures: No procedures performed   Clinical Data: No additional findings.   Subjective: Chief Complaint  Patient presents with  . Left Knee - Follow-up  . Right Knee - Follow-up    [name] follows up today status post right hip injection.  He states that this has improved his right hip and right knee pain.  He had this injection on 12/04/2017.  He would like a knee brace today.  Overall he is doing well.   Review of Systems  Constitutional: Negative.   All other systems reviewed and are negative.    Objective: Vital Signs: There were no vitals taken for this visit.  Physical Exam  Constitutional: He is oriented to person, place, and time. He appears well-developed and well-nourished.  Pulmonary/Chest: Effort normal.  Abdominal: Soft.  Neurological: He is alert and oriented to person, place, and time.  Skin: Skin is warm.  Psychiatric: He has a normal mood and affect. His behavior is normal. Judgment and thought content normal.  Nursing note and vitals  reviewed.   Ortho Exam Right hip and knee exams are both stable.  No joint effusion. Specialty Comments:  No specialty comments available.  Imaging: No results found.   PMFS History: Patient Active Problem List   Diagnosis Date Noted  . Right knee pain 07/23/2017  . Pain in right hip 07/23/2017  . Nocturnal hypoxemia 06/29/2017  . Primary osteoarthritis of both hips 06/29/2017  . Chronic diastolic heart failure ([place]) 04/27/2017  . Coronary artery disease involving native coronary artery of native heart without angina pectoris 03/31/2017  . Ischemic cardiomyopathy 03/31/2017  . Marijuana use 03/31/2017  . Prediabetes 03/31/2017  . Aortic valve insufficiency 12/28/2016  . Mitral valve regurgitation 12/28/2016  . Obesity 12/28/2016  . Aortic valve stenosis   . 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 Hypertension    Past Medical History:  Diagnosis Date  . CAD (coronary artery disease), native coronary artery 12/26/2016   DES LAD & RCA, EF 25-30%  . Hypertension   . Obesity   . Osteoarthritis   . Pre-diabetes    a. prior A1C 5.6, states he was on meds for this at one point.    Family History  Problem Relation Age of Onset  . CAD Father  MI/CABG age 15    Past Surgical History:  Procedure Laterality Date  . CORONARY STENT INTERVENTION N/A 12/29/2016   Procedure: CORONARY STENT INTERVENTION;  Surgeon: Jettie Booze, MD;  Location: [facility];  Service: Cardiovascular;  Laterality: N/A;  . RIGHT/LEFT HEART CATH AND CORONARY ANGIOGRAPHY N/A 12/29/2016   Procedure: RIGHT/LEFT HEART CATH AND CORONARY ANGIOGRAPHY;  Surgeon: Jettie Booze, MD;  Location: [facility];  Service: Cardiovascular;  Laterality: N/A;   Social History   Occupational History  . Not on file  Tobacco Use  . Smoking status: Former Research scientist (life sciences)  . Smokeless tobacco: Never Used  . Tobacco comment: Smoked lightly for approx 20  years, quit 1990s  Substance and Sexual Activity  . Alcohol use: Yes    Comment: 1-2 mixed drinks per week  . Drug use: Yes    Types: Marijuana  . Sexual activity: Not on file

## 2017-12-23 MED FILL — LISINOPRIL 10 MG TABS: 10 | 30 days supply | Qty: 30 | Fill #1

## 2017-12-23 MED FILL — ATORVASTATIN 40 MG TABLET: 40 | 30 days supply | Qty: 30 | Fill #6

## 2017-12-25 ENCOUNTER — Encounter: Payer: Self-pay | Admitting: Cardiovascular Disease

## 2017-12-25 ENCOUNTER — Other Ambulatory Visit: Payer: Self-pay

## 2017-12-25 ENCOUNTER — Ambulatory Visit (INDEPENDENT_AMBULATORY_CARE_PROVIDER_SITE_OTHER): Payer: Medicaid Other | Admitting: Cardiovascular Disease

## 2017-12-25 VITALS — BP 122/78 | HR 72 | Ht 73.0 in | Wt 211.0 lb

## 2017-12-25 DIAGNOSIS — I77810 Thoracic aortic ectasia: Secondary | ICD-10-CM | POA: Diagnosis not present

## 2017-12-25 DIAGNOSIS — E785 Hyperlipidemia, unspecified: Secondary | ICD-10-CM | POA: Diagnosis not present

## 2017-12-25 DIAGNOSIS — R0989 Other specified symptoms and signs involving the circulatory and respiratory systems: Secondary | ICD-10-CM

## 2017-12-25 DIAGNOSIS — I10 Essential (primary) hypertension: Secondary | ICD-10-CM | POA: Diagnosis not present

## 2017-12-25 DIAGNOSIS — I5032 Chronic diastolic (congestive) heart failure: Secondary | ICD-10-CM

## 2017-12-25 DIAGNOSIS — I2510 Atherosclerotic heart disease of native coronary artery without angina pectoris: Secondary | ICD-10-CM | POA: Diagnosis not present

## 2017-12-25 DIAGNOSIS — I350 Nonrheumatic aortic (valve) stenosis: Secondary | ICD-10-CM | POA: Diagnosis not present

## 2017-12-25 DIAGNOSIS — Z79899 Other long term (current) drug therapy: Secondary | ICD-10-CM

## 2017-12-25 MED ORDER — CARVEDILOL 3.125 MG PO TABS: 3.1250 mg | ORAL_TABLET | Freq: Two times a day (BID) | ORAL | 3 refills | Status: DC

## 2017-12-25 MED FILL — CARVEDILOL 3.125 MG TABLET: 3.125 | 30 days supply | Qty: 60 | Fill #0

## 2017-12-25 MED FILL — LIDOCAINE PATCH 5%: 5 | 30 days supply | Qty: 30 | Fill #3

## 2017-12-25 NOTE — Telephone Encounter (Signed)
Pt came in to discuss paperwork he received from medicaid about his PA denial on the lidocaine patches. I explained to him that he had not met the criteria for the medication and that is why Dr. Wynetta Emery started him on the Cymbalta. He reported that he took the Cymbalta for 'a few weeks, maybe 2-3' and didn't like how it made him feel and didn't notice any help in relieving his symptoms. I told him that we could try again for the PA since he has now failed two options he may be approved.  I submitted the PA and it was approved on 12/25/17 and filled at the pharmacy.

## 2017-12-25 NOTE — Progress Notes (Signed)
Cardiology Office Note:    Date:  12/26/2017   ID:  Joshua [name], DOB Sep 18, 1960, MRN [id number]  PCP:  Ladell Pier, MD  Cardiologist:  Sanda Klein, MD   Referring MD: Ladell Pier, MD   Chief Complaint  Patient presents with  . Coronary Artery Disease  . Aortic Stenosis  . Thoracic Aortic Aneurysm  CAD, follow-up echocardiogram  History of Present Illness:    Joshua [name] is a 57 y.o. male who presented with decompensated heart failure and severe cardiomyopathy (EF 25-30 %) in September 2018 and was identified as having severe 2 vessel coronary artery disease, with drug-eluting stents placed to the LAD and RCA. Following revascularization he has had remarkable improvement in LV systolic function which is now normal (EF 55-60%). He has a bicuspid aortic valve, moderate aortic stenosis (mean gradient 25 mmHg) and very mild aortic root enlargement (40 mm).  Doing great from a cardiovascular point of view, but is limited by severe right hip pain.  He was contemplating hip replacement surgery, but after a second hip injection feels much better.The patient specifically denies any chest pain at rest exertion, dyspnea at rest or with exertion, orthopnea, paroxysmal nocturnal dyspnea, syncope, palpitations, focal neurological deficits, intermittent claudication, lower extremity edema, unexplained weight gain, cough, hemoptysis or wheezing.  He has done much better since taking the lisinopril in two daily divided doses. He did not tolerate beta blockers due to extreme bradycardia.  Past Medical History:  Diagnosis Date  . CAD (coronary artery disease), native coronary artery 12/26/2016   DES LAD & RCA, EF 25-30%  . Hypertension   . Obesity   . Osteoarthritis   . Pre-diabetes    a. prior A1C 5.6, states he was on meds for this at one point.    Past Surgical History:  Procedure Laterality Date  . CORONARY STENT INTERVENTION N/A 12/29/2016   Procedure: CORONARY  STENT INTERVENTION;  Surgeon: Jettie Booze, MD;  Location: [facility];  Service: Cardiovascular;  Laterality: N/A;  . RIGHT/LEFT HEART CATH AND CORONARY ANGIOGRAPHY N/A 12/29/2016   Procedure: RIGHT/LEFT HEART CATH AND CORONARY ANGIOGRAPHY;  Surgeon: Jettie Booze, MD;  Location: [facility];  Service: Cardiovascular;  Laterality: N/A;    Current Medications: Current Meds  Medication Sig  . acetaminophen (TYLENOL) 500 MG tablet Take 1,000 mg by mouth 2 (two) times daily as needed.  Marland Kitchen aspirin 81 MG chewable tablet Chew 1 tablet (81 mg total) by mouth daily.  Marland Kitchen atorvastatin (LIPITOR) 40 MG tablet Take 1 tablet (40 mg total) by mouth daily at 6 PM.  . lisinopril (PRINIVIL,ZESTRIL) 10 MG tablet TAKE 1 TABLET BY MOUTH 2 TIMES DAILY.  Marland Kitchen potassium chloride (K-DUR) 10 MEQ tablet TAKE 1 TABLET (10 MEQ TOTAL) BY MOUTH DAILY.  . [DISCONTINUED] ticagrelor (BRILINTA) 90 MG TABS tablet Take 1 tablet (90 mg total) by mouth 2 (two) times daily.     Allergies:   Patient has no known allergies.   Social History   Socioeconomic History  . Marital status: Single    Spouse name: Not on file  . Number of children: Not on file  . Years of education: Not on file  . Highest education level: Not on file  Occupational History  . Not on file  Social Needs  . Financial resource strain: Not on file  . Food insecurity:    Worry: Not on file    Inability: Not on file  . Transportation needs:  Medical: Not on file    Non-medical: Not on file  Tobacco Use  . Smoking status: Former Research scientist (life sciences)  . Smokeless tobacco: Never Used  . Tobacco comment: Smoked lightly for approx 20 years, quit 1990s  Substance and Sexual Activity  . Alcohol use: Yes    Comment: 1-2 mixed drinks per week  . Drug use: Yes    Types: Marijuana  . Sexual activity: Not on file  Lifestyle  . Physical activity:    Days per week: Not on file    Minutes per session: Not on file  . Stress: Not on file    Relationships  . Social connections:    Talks on phone: Not on file    Gets together: Not on file    Attends religious service: Not on file    Active member of club or organization: Not on file    Attends meetings of clubs or organizations: Not on file    Relationship status: Not on file  Other Topics Concern  . Not on file  Social History Narrative   Lives Home alone. Sister makes all health related decisions. Declined Advanced directive at this time     Family History: The patient's family history includes CAD in his father.  ROS:   Please see the history of present illness.    All other systems are reviewed and are negative  EKGs/Labs/Other Studies Reviewed:    EKG:  EKG is not ordered today.    Recent Labs: 12/27/2016: TSH 2.493 12/29/2016: B Natriuretic Peptide 1,289.4; Magnesium 2.0 12/30/2016: Hemoglobin 16.3; Platelets 174 01/15/2017: ALT 28 02/10/2017: BUN 11; Creatinine, Ser 0.93; Potassium 4.3; Sodium 141  Recent Lipid Panel    Component Value Date/Time   CHOL 94 (L) 01/15/2017 1116   TRIG 131 01/15/2017 1116   HDL 38 (L) 01/15/2017 1116   CHOLHDL 2.5 01/15/2017 1116   CHOLHDL 4.6 12/27/2016 0238   VLDL 18 12/27/2016 0238   LDLCALC 30 01/15/2017 1116    Physical Exam:    VS:  BP 122/78 (BP Location: Left Arm, Patient Position: Sitting, Cuff Size: Normal)   Pulse 72   Ht 6\' 1"  (1.854 m)   Wt 211 lb (95.7 kg)   BMI 27.84 kg/m     Wt Readings from Last 3 Encounters:  12/25/17 211 lb (95.7 kg)  10/01/17 222 lb 6.4 oz (100.9 kg)  06/29/17 217 lb 9.6 oz (98.7 kg)      General: Alert, oriented x3, no distress, overweight Head: no evidence of trauma, PERRL, EOMI, no exophtalmos or lid lag, no myxedema, no xanthelasma; normal ears, nose and oropharynx Neck: normal jugular venous pulsations and no hepatojugular reflux; brisk carotid pulses with loud bilateral carotid bruits Chest: clear to auscultation, no signs of consolidation by percussion or palpation,  normal fremitus, symmetrical and full respiratory excursions Cardiovascular: normal position and quality of the apical impulse, regular rhythm, normal first and second heart sounds, 3/6 aortic ejection murmur, early to mid peaking, radiating broadly throughout the entire chest, no diastolic murmurs, rubs or gallops Abdomen: no tenderness or distention, no masses by palpation, no abnormal pulsatility or arterial bruits, normal bowel sounds, no hepatosplenomegaly Extremities: no clubbing, cyanosis or edema; 2+ radial, ulnar and brachial pulses bilaterally; 2+ right femoral, posterior tibial and dorsalis pedis pulses; 2+ left femoral, posterior tibial and dorsalis pedis pulses; no subclavian or femoral bruits Neurological: grossly nonfocal Psych: Normal mood and affect  ASSESSMENT:    1. Chronic diastolic heart failure ([place])   2.  Coronary artery disease involving native coronary artery of native heart without angina pectoris   3. Aortic valve stenosis, nonrheumatic   4. Bilateral carotid bruits   5. Essential hypertension   6. Dyslipidemia   7. Mild dilation of ascending aorta ([facility])   8. Medication management    PLAN:    In order of problems listed above:  1. CHF: Systolic dysfunction resolved completely after revascularization.  He is no longer requiring any loop diuretics.  NYHA functional class I. 2. CAD: Retrospectively, after his revascularization he noticed that he no longer had chest tightness.  This has not recurred.  No evidence of restenosis.  Time to stop Brilinta. 3. AS: probably bicuspid aortic valve, moderate severity; mild associated aortic dilation.  He is physically limited by his hip problems right now, but asked him to promptly report exertional angina, exertional dyspnea or exertional syncope.  We will recheck echo in a year. 4. Bilateral carotid bruits: Might be radiating from the chest, but since he has vascular disease we should check for carotid stenoses.  Scheduled for  duplex ultrasound. 5. HTN: Well controlled without symptoms with twice daily ACE inhibitor.  No longer on diuretics.  We will try a very low dose of beta-blocker as well for CAD. 6. HLP: He is lost a little more weight, he is still in overweight range.  He is to be congratulated he is trying hard.  He may not get to a target waistline of 34 inches, but any additional weight loss will still be beneficial, especially to help improve his low HDL.  Time to recheck his lipid profile. 7. 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  Recheck echo in a year.  CT angiogram if there is any sign of enlargement.    Medication Adjustments/Labs and Tests Ordered: Current medicines are reviewed at length with the patient today.  Concerns regarding medicines are outlined above.  Orders Placed This Encounter  Procedures  . Comprehensive metabolic panel  . Lipid panel   Meds ordered this encounter  Medications  . carvedilol (COREG) 3.125 MG tablet    Sig: Take 1 tablet (3.125 mg total) by mouth 2 (two) times daily.    Dispense:  180 tablet    Refill:  3    Signed, Sanda Klein, MD  12/26/2017 4:11 PM    [facility]

## 2017-12-25 NOTE — Patient Instructions (Signed)
Medication Instructions: Dr Sallyanne Kuster has recommended making the following medication changes: 1. STOP Brilinta 2. START Carvedilol 3.125 mg - take 1 tablet twice daily  Your physician has requested that you regularly monitor your blood pressure at home. Please use the same machine to check your blood pressure daily. Keep a record of your blood pressures using the log sheet provided.  Call the office, [phone], if your heart rate is consistently less than 50 bpm or your systolic blood pressure (the top number of blood pressure) is less than 100.   Labwork: Your physician recommends that you return for lab work at your earliest [place].  Testing/Procedures: 1. Carotid Duplex - Your physician has requested that you have a carotid duplex. This test is an ultrasound of the carotid arteries in your neck. It looks at blood flow through these arteries that supply the brain with blood. Allow one hour for this exam. There are no restrictions or special instructions.  Follow-up: Dr Sallyanne Kuster recommends that you schedule a follow-up appointment in 12 months. You will receive a reminder letter in the mail two months in advance. If you don't receive a letter, please call our office to schedule the follow-up appointment.  If you need a refill on your cardiac medications before your next appointment, please call your pharmacy.

## 2017-12-26 DIAGNOSIS — I77810 Thoracic aortic ectasia: Secondary | ICD-10-CM | POA: Insufficient documentation

## 2017-12-26 DIAGNOSIS — R0989 Other specified symptoms and signs involving the circulatory and respiratory systems: Secondary | ICD-10-CM | POA: Insufficient documentation

## 2017-12-26 DIAGNOSIS — E785 Hyperlipidemia, unspecified: Secondary | ICD-10-CM | POA: Insufficient documentation

## 2017-12-31 ENCOUNTER — Ambulatory Visit (HOSPITAL_COMMUNITY): Payer: Medicaid Other

## 2018-01-01 ENCOUNTER — Encounter: Payer: Self-pay | Admitting: Internal Medicine

## 2018-01-01 ENCOUNTER — Ambulatory Visit: Payer: Medicaid Other | Attending: Internal Medicine | Admitting: Internal Medicine

## 2018-01-01 VITALS — BP 109/70 | HR 66 | Temp 98.0°F | Resp 16 | Wt 211.6 lb

## 2018-01-01 DIAGNOSIS — Z9889 Other specified postprocedural states: Secondary | ICD-10-CM | POA: Insufficient documentation

## 2018-01-01 DIAGNOSIS — Z23 Encounter for immunization: Secondary | ICD-10-CM | POA: Diagnosis not present

## 2018-01-01 DIAGNOSIS — F1290 Cannabis use, unspecified, uncomplicated: Secondary | ICD-10-CM | POA: Insufficient documentation

## 2018-01-01 DIAGNOSIS — I255 Ischemic cardiomyopathy: Secondary | ICD-10-CM | POA: Diagnosis not present

## 2018-01-01 DIAGNOSIS — D170 Benign lipomatous neoplasm of skin and subcutaneous tissue of head, face and neck: Secondary | ICD-10-CM | POA: Insufficient documentation

## 2018-01-01 DIAGNOSIS — Z09 Encounter for follow-up examination after completed treatment for conditions other than malignant neoplasm: Secondary | ICD-10-CM | POA: Diagnosis present

## 2018-01-01 DIAGNOSIS — R7303 Prediabetes: Secondary | ICD-10-CM | POA: Diagnosis not present

## 2018-01-01 DIAGNOSIS — Z79899 Other long term (current) drug therapy: Secondary | ICD-10-CM | POA: Insufficient documentation

## 2018-01-01 DIAGNOSIS — I110 Hypertensive heart disease with heart failure: Secondary | ICD-10-CM | POA: Insufficient documentation

## 2018-01-01 DIAGNOSIS — I5032 Chronic diastolic (congestive) heart failure: Secondary | ICD-10-CM | POA: Insufficient documentation

## 2018-01-01 DIAGNOSIS — G4734 Idiopathic sleep related nonobstructive alveolar hypoventilation: Secondary | ICD-10-CM | POA: Diagnosis not present

## 2018-01-01 DIAGNOSIS — Z7982 Long term (current) use of aspirin: Secondary | ICD-10-CM | POA: Diagnosis not present

## 2018-01-01 DIAGNOSIS — Z1211 Encounter for screening for malignant neoplasm of colon: Secondary | ICD-10-CM

## 2018-01-01 DIAGNOSIS — Z87891 Personal history of nicotine dependence: Secondary | ICD-10-CM | POA: Diagnosis not present

## 2018-01-01 DIAGNOSIS — E785 Hyperlipidemia, unspecified: Secondary | ICD-10-CM | POA: Diagnosis not present

## 2018-01-01 DIAGNOSIS — M160 Bilateral primary osteoarthritis of hip: Secondary | ICD-10-CM | POA: Diagnosis not present

## 2018-01-01 DIAGNOSIS — Z955 Presence of coronary angioplasty implant and graft: Secondary | ICD-10-CM | POA: Insufficient documentation

## 2018-01-01 DIAGNOSIS — Z8249 Family history of ischemic heart disease and other diseases of the circulatory system: Secondary | ICD-10-CM | POA: Diagnosis not present

## 2018-01-01 DIAGNOSIS — I2510 Atherosclerotic heart disease of native coronary artery without angina pectoris: Secondary | ICD-10-CM | POA: Insufficient documentation

## 2018-01-01 NOTE — Progress Notes (Signed)
Patient ID: Joshua [name], male    DOB: 03-05-1961  MRN: [id number]  CC: Follow-up (3 month )   Subjective: Joshua [name] is a 57 y.o. male who presents for chronic ds managment His concerns today include:  Patient with history of HTN, OA BL hips, prediabetes, CAD [with stent to LAD, RCA],EF 55-60%%,bradycardia , mod AS, dilated ascending aorta  OA hips: had steroid inj to RT hip recently by Dr. Ernestina Patches.  Reports that he is gotten good relief with this.  He is contemplating having hip replacement in the next several months. Reports Cymbalta caused his BP to increased.  So he is back on Lido patches as needed  Had over night oximetry and turned in the device to [facility] this wk.  I have not received the results as yet.  He was hoping that I had the results today.  CAD/moderate left ear/dilated a sending aorta: Saw his cardiologist Dr. Luane School last week.  Brilinta discontinued as he has been on it for a year now.  He is continuing with aspirin.  Patient denies any chest pains or shortness of breath at this time.   Patient Active Problem List   Diagnosis Date Noted  . Mild dilation of ascending aorta (HCC) 12/26/2017  . Dyslipidemia 12/26/2017  . Bilateral carotid bruits 12/26/2017  . Right knee pain 07/23/2017  . Pain in right hip 07/23/2017  . Nocturnal hypoxemia 06/29/2017  . Primary osteoarthritis of both hips 06/29/2017  . Chronic diastolic heart failure ([place]) 04/27/2017  . Coronary artery disease involving native coronary artery of native heart without angina pectoris 03/31/2017  . Ischemic cardiomyopathy 03/31/2017  . Marijuana use 03/31/2017  . Prediabetes 03/31/2017  . Aortic valve insufficiency 12/28/2016  . Mitral valve regurgitation 12/28/2016  . Obesity 12/28/2016  . Aortic valve stenosis, nonrheumatic   . 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 Essential hypertension      Current Outpatient  Medications on File Prior to Visit  Medication Sig Dispense Refill  . acetaminophen (TYLENOL) 500 MG tablet Take 1,000 mg by mouth 2 (two) times daily as needed.    Marland Kitchen aspirin 81 MG chewable tablet Chew 1 tablet (81 mg total) by mouth daily. 90 tablet 3  . atorvastatin (LIPITOR) 40 MG tablet Take 1 tablet (40 mg total) by mouth daily at 6 PM. 90 tablet 3  . carvedilol (COREG) 3.125 MG tablet Take 1 tablet (3.125 mg total) by mouth 2 (two) times daily. 180 tablet 3  . lisinopril (PRINIVIL,ZESTRIL) 10 MG tablet TAKE 1 TABLET BY MOUTH 2 TIMES DAILY. 60 tablet 2  . potassium chloride (K-DUR) 10 MEQ tablet TAKE 1 TABLET (10 MEQ TOTAL) BY MOUTH DAILY. 30 tablet 1   No current facility-administered medications on file prior to visit.     No Known Allergies  Social History   Socioeconomic History  . Marital status: Single    Spouse name: Not on file  . Number of children: Not on file  . Years of education: Not on file  . Highest education level: Not on file  Occupational History  . Not on file  Social Needs  . Financial resource strain: Not on file  . Food insecurity:    Worry: Not on file    Inability: Not on file  . Transportation needs:    Medical: Not on file    Non-medical: Not on file  Tobacco Use  . Smoking status: Former Research scientist (life sciences)  .  Smokeless tobacco: Never Used  . Tobacco comment: Smoked lightly for approx 20 years, quit 1990s  Substance and Sexual Activity  . Alcohol use: Yes    Comment: 1-2 mixed drinks per week  . Drug use: Yes    Types: Marijuana  . Sexual activity: Not on file  Lifestyle  . Physical activity:    Days per week: Not on file    Minutes per session: Not on file  . Stress: Not on file  Relationships  . Social connections:    Talks on phone: Not on file    Gets together: Not on file    Attends religious service: Not on file    Active member of club or organization: Not on file    Attends meetings of clubs or organizations: Not on file     Relationship status: Not on file  . Intimate partner violence:    Fear of current or ex partner: Not on file    Emotionally abused: Not on file    Physically abused: Not on file    Forced sexual activity: Not on file  Other Topics Concern  . Not on file  Social History Narrative   Lives Home alone. Sister makes all health related decisions. Declined Advanced directive at this time    Family History  Problem Relation Age of Onset  . CAD Father        MI/CABG age 15    Past Surgical History:  Procedure Laterality Date  . CORONARY STENT INTERVENTION N/A 12/29/2016   Procedure: CORONARY STENT INTERVENTION;  Surgeon: Jettie Booze, MD;  Location: [facility];  Service: Cardiovascular;  Laterality: N/A;  . RIGHT/LEFT HEART CATH AND CORONARY ANGIOGRAPHY N/A 12/29/2016   Procedure: RIGHT/LEFT HEART CATH AND CORONARY ANGIOGRAPHY;  Surgeon: Jettie Booze, MD;  Location: [facility];  Service: Cardiovascular;  Laterality: N/A;    ROS: Review of Systems Neg except as above  PHYSICAL EXAM: BP 109/70   Pulse 66   Temp 98 F (36.7 C) (Oral)   Resp 16   Wt 211 lb 9.6 oz (96 kg)   SpO2 98%   BMI 27.92 kg/m   Physical Exam  General appearance - alert, well appearing, and in no distress Mental status - normal mood, behavior, speech, dress, motor activity, and thought processes Chest - clear to auscultation, no wheezes, rales or rhonchi, symmetric air entry Heart - normal rate, regular rhythm, normal S1, S2, no murmurs, rubs, clicks or gallops Extremities - peripheral pulses normal, no pedal edema, no clubbing or cyanosis Skin -5 x 5 cm soft non-movable mass in the left occipital area which has been there for a while.  He does not feel it has changed in size.   ASSESSMENT AND PLAN: 1. Hypoxia, sleep related Await results from [facility] of his overnight pulse oximetry study  2. Primary osteoarthritis of both hips Some relief with recent steroid  injection.  Followed by Ortho.  3. Lipoma of head Observe for now 4. Coronary artery disease involving native coronary artery of native heart without angina pectoris Clinically stable.  Followed by cardiology. - CBC; Future  5. Colon cancer screening Patient agreeable to colon cancer screening with colonoscopy. - Ambulatory referral to Gastroenterology  6. Need for influenza vaccination Given today.  7. Prediabetes Discussed importance of healthy eating habits and regular exercise - Hemoglobin A1c; Future  8. Need for immunization against influenza - Flu Vaccine QUAD 36+ mos IM  Patient was given  the opportunity to ask questions.  Patient verbalized understanding of the plan and was able to repeat key elements of the plan.   Orders Placed This Encounter  Procedures  . Flu Vaccine QUAD 36+ mos IM  . Hemoglobin A1c  . CBC  . Ambulatory referral to Gastroenterology     Requested Prescriptions    No prescriptions requested or ordered in this encounter    Return in about 3 months (around 04/02/2018).  Karle Plumber, MD, FACP

## 2018-01-05 ENCOUNTER — Ambulatory Visit: Payer: Medicaid Other

## 2018-01-05 ENCOUNTER — Ambulatory Visit (HOSPITAL_COMMUNITY)
Admission: RE | Admit: 2018-01-05 | Discharge: 2018-01-05 | Disposition: A | Discharge status: Home / Self Care | Payer: Medicaid Other | Source: Ambulatory Visit | Attending: Internal Medicine | Admitting: Internal Medicine

## 2018-01-05 DIAGNOSIS — I2510 Atherosclerotic heart disease of native coronary artery without angina pectoris: Secondary | ICD-10-CM | POA: Insufficient documentation

## 2018-01-05 DIAGNOSIS — R7303 Prediabetes: Secondary | ICD-10-CM | POA: Diagnosis not present

## 2018-01-05 DIAGNOSIS — E785 Hyperlipidemia, unspecified: Secondary | ICD-10-CM | POA: Diagnosis not present

## 2018-01-05 DIAGNOSIS — Z79899 Other long term (current) drug therapy: Secondary | ICD-10-CM | POA: Diagnosis not present

## 2018-01-05 DIAGNOSIS — R0989 Other specified symptoms and signs involving the circulatory and respiratory systems: Secondary | ICD-10-CM | POA: Diagnosis not present

## 2018-01-05 NOTE — Progress Notes (Signed)
Patient here for lab visit only 

## 2018-01-06 ENCOUNTER — Telehealth: Payer: Self-pay

## 2018-01-06 LAB — HEMOGLOBIN A1C
Est. average glucose Bld gHb Est-mCnc: 117 mg/dL
Hgb A1c MFr Bld: 5.7 % — ABNORMAL HIGH (ref 4.8–5.6)

## 2018-01-06 LAB — COMPREHENSIVE METABOLIC PANEL
ALK PHOS: 591 IU/L — AB (ref 39–117)
ALT: 428 IU/L — ABNORMAL HIGH (ref 0–44)
AST: 200 IU/L — ABNORMAL HIGH (ref 0–40)
Albumin/Globulin Ratio: 1.7 (ref 1.2–2.2)
Albumin: 4.3 g/dL (ref 3.5–5.5)
BUN / CREAT RATIO: 17 (ref 9–20)
BUN: 13 mg/dL (ref 6–24)
Bilirubin Total: 1.6 mg/dL — ABNORMAL HIGH (ref 0.0–1.2)
CO2: 21 mmol/L (ref 20–29)
CREATININE: 0.75 mg/dL — AB (ref 0.76–1.27)
Calcium: 9.7 mg/dL (ref 8.7–10.2)
Chloride: 103 mmol/L (ref 96–106)
GFR calc non Af Amer: 100 mL/min/{1.73_m2} (ref >59)
GFR, EST AFRICAN AMERICAN: 116 mL/min/{1.73_m2} (ref >59)
GLOBULIN, TOTAL: 2.6 g/dL (ref 1.5–4.5)
Glucose: 134 mg/dL — ABNORMAL HIGH (ref 65–99)
Potassium: 4.6 mmol/L (ref 3.5–5.2)
SODIUM: 142 mmol/L (ref 134–144)
TOTAL PROTEIN: 6.9 g/dL (ref 6.0–8.5)

## 2018-01-06 LAB — CBC
HEMOGLOBIN: 15.7 g/dL (ref 13.0–17.7)
Hematocrit: 46.5 % (ref 37.5–51.0)
MCH: 31.2 pg (ref 26.6–33.0)
MCHC: 33.8 g/dL (ref 31.5–35.7)
MCV: 92 fL (ref 79–97)
PLATELETS: 188 10*3/uL (ref 150–450)
RBC: 5.04 x10E6/uL (ref 4.14–5.80)
RDW: 12.9 % (ref 12.3–15.4)
WBC: 6.1 10*3/uL (ref 3.4–10.8)

## 2018-01-06 LAB — LIPID PANEL
Chol/HDL Ratio: 2 ratio (ref 0.0–5.0)
Cholesterol, Total: 128 mg/dL (ref 100–199)
HDL: 65 mg/dL
LDL Calculated: 48 mg/dL (ref 0–99)
Triglycerides: 77 mg/dL (ref 0–149)
VLDL Cholesterol Cal: 15 mg/dL (ref 5–40)

## 2018-01-06 NOTE — Telephone Encounter (Signed)
Contacted pt to go over lab results pt is aware and doesn't have any questions or concerns 

## 2018-01-12 NOTE — Addendum Note (Signed)
Addended by: Therisa Doyne on: 01/12/2018 12:09 PM   Modules accepted: Orders

## 2018-01-17 ENCOUNTER — Telehealth: Payer: Self-pay | Admitting: Internal Medicine

## 2018-01-17 DIAGNOSIS — R7989 Other specified abnormal findings of blood chemistry: Secondary | ICD-10-CM

## 2018-01-17 DIAGNOSIS — R945 Abnormal results of liver function studies: Secondary | ICD-10-CM

## 2018-01-17 NOTE — Telephone Encounter (Signed)
PC placed to pt this a.m.  I informed him that I received message from his cardiologist, Dr. Sallyanne Kuster regarding his abnormal LFTs.  Pt states he received a message from Dr. Victorino December office telling him to give a call back but he did not. I informed pt that his LFTs are abnormal. Pt states he stopped taking Tylenol after he received inj to his hip by Dr. Ernestina Patches 12/04/2017. He drinks 1-2 shots of liquor about once a wk.  He denies noticing any yellowing of the skin, change in color of stools, abdominal pain, N/V.  Pt advised to stop Lipitor and Tylenol.  Do not drink any ETOH beverages.  Pt to come to lab in a.m to have repeat LFTs and hepatitis profile.  Message sent to scheduler to get him in this wk. Pt expressed understanding and was able to repeat recommendations.

## 2018-01-18 ENCOUNTER — Other Ambulatory Visit: Payer: Self-pay | Admitting: Internal Medicine

## 2018-01-18 DIAGNOSIS — I10 Essential (primary) hypertension: Secondary | ICD-10-CM

## 2018-01-19 ENCOUNTER — Ambulatory Visit: Payer: Medicaid Other | Attending: Internal Medicine

## 2018-01-19 DIAGNOSIS — R945 Abnormal results of liver function studies: Secondary | ICD-10-CM | POA: Diagnosis not present

## 2018-01-19 DIAGNOSIS — R7989 Other specified abnormal findings of blood chemistry: Secondary | ICD-10-CM

## 2018-01-19 MED FILL — LISINOPRIL 10 MG TABS: 10 | 30 days supply | Qty: 60 | Fill #0

## 2018-01-19 NOTE — Progress Notes (Signed)
Patient here for lab visit only 

## 2018-01-20 LAB — HEPATITIS PANEL, ACUTE
HEP A IGM: NEGATIVE
HEP B C IGM: NEGATIVE
HEP B S AG: NEGATIVE
Hep C Virus Ab: <0.1 s/co ratio (ref 0.0–0.9)

## 2018-01-20 LAB — HEPATIC FUNCTION PANEL
ALBUMIN: 4 g/dL (ref 3.5–5.5)
ALK PHOS: 434 IU/L — AB (ref 39–117)
ALT: 129 IU/L — ABNORMAL HIGH (ref 0–44)
AST: 53 IU/L — AB (ref 0–40)
Bilirubin Total: 0.5 mg/dL (ref 0.0–1.2)
Bilirubin, Direct: 0.27 mg/dL (ref 0.00–0.40)
Total Protein: 6.5 g/dL (ref 6.0–8.5)

## 2018-01-21 ENCOUNTER — Ambulatory Visit: Payer: Medicaid Other | Admitting: Internal Medicine

## 2018-01-21 ENCOUNTER — Other Ambulatory Visit: Payer: Self-pay | Admitting: Internal Medicine

## 2018-01-21 ENCOUNTER — Telehealth: Payer: Self-pay | Admitting: Internal Medicine

## 2018-01-21 NOTE — Telephone Encounter (Signed)
Patient came to the facility to let his PCP know that he is only taking the following medications:   Lisinopril 10MG  Carvedilol 3.125 Potassium   Please f/u with patient to make sure the medication he is taking is correct.

## 2018-01-21 NOTE — Telephone Encounter (Signed)
Will forward to pcp

## 2018-01-22 NOTE — Telephone Encounter (Signed)
Contacted pt to go over lab results pt is aware and doesn't have any questions or concerns 

## 2018-02-04 ENCOUNTER — Ambulatory Visit: Payer: Medicaid Other | Admitting: Internal Medicine

## 2018-02-05 ENCOUNTER — Encounter: Payer: Self-pay | Admitting: Internal Medicine

## 2018-02-05 ENCOUNTER — Ambulatory Visit: Payer: Medicaid Other | Attending: Internal Medicine | Admitting: Internal Medicine

## 2018-02-05 VITALS — BP 128/80 | HR 52 | Temp 97.5°F | Resp 16 | Wt 213.6 lb

## 2018-02-05 DIAGNOSIS — E785 Hyperlipidemia, unspecified: Secondary | ICD-10-CM | POA: Diagnosis not present

## 2018-02-05 DIAGNOSIS — I10 Essential (primary) hypertension: Secondary | ICD-10-CM | POA: Diagnosis not present

## 2018-02-05 DIAGNOSIS — M160 Bilateral primary osteoarthritis of hip: Secondary | ICD-10-CM | POA: Diagnosis not present

## 2018-02-05 DIAGNOSIS — Z23 Encounter for immunization: Secondary | ICD-10-CM | POA: Diagnosis not present

## 2018-02-05 DIAGNOSIS — R945 Abnormal results of liver function studies: Secondary | ICD-10-CM | POA: Diagnosis not present

## 2018-02-05 DIAGNOSIS — I5032 Chronic diastolic (congestive) heart failure: Secondary | ICD-10-CM | POA: Diagnosis not present

## 2018-02-05 DIAGNOSIS — Z87891 Personal history of nicotine dependence: Secondary | ICD-10-CM | POA: Insufficient documentation

## 2018-02-05 DIAGNOSIS — R7303 Prediabetes: Secondary | ICD-10-CM | POA: Insufficient documentation

## 2018-02-05 DIAGNOSIS — Z7982 Long term (current) use of aspirin: Secondary | ICD-10-CM | POA: Diagnosis not present

## 2018-02-05 DIAGNOSIS — Z79899 Other long term (current) drug therapy: Secondary | ICD-10-CM | POA: Diagnosis not present

## 2018-02-05 DIAGNOSIS — R7989 Other specified abnormal findings of blood chemistry: Secondary | ICD-10-CM

## 2018-02-05 DIAGNOSIS — I2510 Atherosclerotic heart disease of native coronary artery without angina pectoris: Secondary | ICD-10-CM | POA: Diagnosis not present

## 2018-02-05 DIAGNOSIS — I255 Ischemic cardiomyopathy: Secondary | ICD-10-CM | POA: Diagnosis not present

## 2018-02-05 DIAGNOSIS — Z8249 Family history of ischemic heart disease and other diseases of the circulatory system: Secondary | ICD-10-CM | POA: Insufficient documentation

## 2018-02-05 DIAGNOSIS — Z955 Presence of coronary angioplasty implant and graft: Secondary | ICD-10-CM | POA: Insufficient documentation

## 2018-02-05 DIAGNOSIS — I110 Hypertensive heart disease with heart failure: Secondary | ICD-10-CM | POA: Diagnosis not present

## 2018-02-05 NOTE — Progress Notes (Signed)
Patient ID: Joshua [name], male    DOB: 11/10/1960  MRN: [id number]  CC: Follow-up   Subjective: Joshua [name] is a 57 y.o. male who presents for UC visit for follow-up of abnormal liver function test.. His concerns today include:  Patient with history of HTN, OA BL hips, prediabetes, CAD [with stent to LAD, RCA],EF 55-60%%,bradycardia , mod AS, dilated ascending aorta  HTN: Blood pressure elevated today.  Took BP meds late this a.m. Checks BP regularly.  Gives range 120s/60s  Abnormal LFTs: Patient was supposed to be seen 2 weeks ago for follow-up of abnormal LFTs.  However he had arrived late for his appointment and had to be rescheduled for today.  Acute viral hepatitis panel was negative.  Alk phos, AST and ALT had decreased on repeat blood test done 2 weeks ago.  When asked about alcoholic beverages, patient states that he drinks  Two 12 ounce beers and sometimes mixed drinks several times a month.  His statin cholesterol medication is on hold.  Patient Active Problem List   Diagnosis Date Noted  . Mild dilation of ascending aorta (HCC) 12/26/2017  . Dyslipidemia 12/26/2017  . Bilateral carotid bruits 12/26/2017  . Right knee pain 07/23/2017  . Pain in right hip 07/23/2017  . Nocturnal hypoxemia 06/29/2017  . Primary osteoarthritis of both hips 06/29/2017  . Chronic diastolic heart failure ([place]) 04/27/2017  . Coronary artery disease involving native coronary artery of native heart without angina pectoris 03/31/2017  . Ischemic cardiomyopathy 03/31/2017  . Marijuana use 03/31/2017  . Prediabetes 03/31/2017  . Aortic valve insufficiency 12/28/2016  . Mitral valve regurgitation 12/28/2016  . Obesity 12/28/2016  . Aortic valve stenosis, nonrheumatic   . 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 Essential hypertension      Current Outpatient Medications on File Prior to Visit  Medication Sig Dispense Refill  . aspirin 81  MG chewable tablet Chew 1 tablet (81 mg total) by mouth daily. 90 tablet 3  . carvedilol (COREG) 3.125 MG tablet Take 1 tablet (3.125 mg total) by mouth 2 (two) times daily. 180 tablet 3  . lisinopril (PRINIVIL,ZESTRIL) 10 MG tablet TAKE 1 TABLET BY MOUTH 2 TIMES DAILY. 60 tablet 2  . potassium chloride (K-DUR) 10 MEQ tablet TAKE 1 TABLET (10 MEQ TOTAL) BY MOUTH DAILY. 30 tablet 1   No current facility-administered medications on file prior to visit.     No Known Allergies  Social History   Socioeconomic History  . Marital status: Single    Spouse name: Not on file  . Number of children: Not on file  . Years of education: Not on file  . Highest education level: Not on file  Occupational History  . Not on file  Social Needs  . Financial resource strain: Not on file  . Food insecurity:    Worry: Not on file    Inability: Not on file  . Transportation needs:    Medical: Not on file    Non-medical: Not on file  Tobacco Use  . Smoking status: Former Research scientist (life sciences)  . Smokeless tobacco: Never Used  . Tobacco comment: Smoked lightly for approx 20 years, quit 1990s  Substance and Sexual Activity  . Alcohol use: Yes    Comment: 1-2 mixed drinks per week  . Drug use: Yes    Types: Marijuana  . Sexual activity: Not on file  Lifestyle  . Physical activity:    Days per week:  Not on file    Minutes per session: Not on file  . Stress: Not on file  Relationships  . Social connections:    Talks on phone: Not on file    Gets together: Not on file    Attends religious service: Not on file    Active member of club or organization: Not on file    Attends meetings of clubs or organizations: Not on file    Relationship status: Not on file  . Intimate partner violence:    Fear of current or ex partner: Not on file    Emotionally abused: Not on file    Physically abused: Not on file    Forced sexual activity: Not on file  Other Topics Concern  . Not on file  Social History Narrative   Lives  Home alone. Sister makes all health related decisions. Declined Advanced directive at this time    Family History  Problem Relation Age of Onset  . CAD Father        MI/CABG age 75    Past Surgical History:  Procedure Laterality Date  . CORONARY STENT INTERVENTION N/A 12/29/2016   Procedure: CORONARY STENT INTERVENTION;  Surgeon: Jettie Booze, MD;  Location: [facility];  Service: Cardiovascular;  Laterality: N/A;  . RIGHT/LEFT HEART CATH AND CORONARY ANGIOGRAPHY N/A 12/29/2016   Procedure: RIGHT/LEFT HEART CATH AND CORONARY ANGIOGRAPHY;  Surgeon: Jettie Booze, MD;  Location: [facility];  Service: Cardiovascular;  Laterality: N/A;    ROS: Review of Systems Negative except as above. PHYSICAL EXAM: BP 128/80   Pulse (!) 52   Temp (!) 97.5 F (36.4 C) (Oral)   Resp 16   Wt 213 lb 9.6 oz (96.9 kg)   SpO2 99%   BMI 28.18 kg/m   Physical Exam  General appearance - alert, well appearing, and in no distress Mental status - normal mood, behavior, speech, dress, motor activity, and thought processes Neck - supple, no significant adenopathy Chest - clear to auscultation, no wheezes, rales or rhonchi, symmetric air entry Heart - normal rate, regular rhythm, normal S1, S2, positive 3/6 systolic ejection murmur left upper and right upper sternal borders. Abdomen- normal bowel sounds.  No hepatosplenomegaly. Extremities - peripheral pulses normal, no pedal edema, no clubbing or cyanosis   ASSESSMENT AND PLAN:  1. Essential hypertension Repeat blood pressure today in the office is at goal.  He will continue current medications.  2. Abnormal LFTs - Hepatic Function Panel  3.  Need for Tdap.  I still have not received the results for his overnight pulse ox.  We will have my nurse call [facility] to request results.  Patient was given the opportunity to ask questions.  Patient verbalized understanding of the plan and was able to repeat key  elements of the plan.   Orders Placed This Encounter  Procedures  . Tdap vaccine greater than or equal to 7yo IM  . Hepatic Function Panel     Requested Prescriptions    No prescriptions requested or ordered in this encounter    No follow-ups on file.  Karle Plumber, MD, FACP

## 2018-02-05 NOTE — Patient Instructions (Signed)
Td Vaccine (Tetanus and Diphtheria): What You Need to Know 1. Why get vaccinated? Tetanus  and diphtheria are very serious diseases. They are rare in the [place] today, but people who do become infected often have severe complications. Td vaccine is used to protect adolescents and adults from both of these diseases. Both tetanus and diphtheria are infections caused by bacteria. Diphtheria spreads from person to person through coughing or sneezing. Tetanus-causing bacteria enter the body through cuts, scratches, or wounds. TETANUS (lockjaw) causes painful muscle tightening and stiffness, usually all over the body.  It can lead to tightening of muscles in the head and neck so you can't open your mouth, swallow, or sometimes even breathe. Tetanus kills about 1 out of every 10 people who are infected even after receiving the best medical care.  DIPHTHERIA can cause a thick coating to form in the back of the throat.  It can lead to breathing problems, paralysis, heart failure, and death.  Before vaccines, as many as 200,000 cases of diphtheria and hundreds of cases of tetanus were reported in the [place] each year. Since vaccination began, reports of cases for both diseases have dropped by about 99%. 2. Td vaccine Td vaccine can protect adolescents and adults from tetanus and diphtheria. Td is usually given as a booster dose every 10 years but it can also be given earlier after a severe and dirty wound or burn. Another vaccine, called Tdap, which protects against pertussis in addition to tetanus and diphtheria, is sometimes recommended instead of Td vaccine. Your doctor or the person giving you the vaccine can give you more information. Td may safely be given at the same time as other vaccines. 3. Some people should not get this vaccine  A person who has ever had a life-threatening allergic reaction after a previous dose of any tetanus or diphtheria containing vaccine, OR has a severe  allergy to any part of this vaccine, should not get Td vaccine. Tell the person giving the vaccine about any severe allergies.  Talk to your doctor if you: ? had severe pain or swelling after any vaccine containing diphtheria or tetanus, ? ever had a condition called Guillain Barre Syndrome (GBS), ? aren't feeling well on the day the shot is scheduled. 4. What are the risks from Td vaccine? With any medicine, including vaccines, there is a chance of side effects. These are usually mild and go away on their own. Serious reactions are also possible but are rare. Most people who get Td vaccine do not have any problems with it. Mild problems following Td vaccine: (Did not interfere with activities)  Pain where the shot was given (about 8 people in 10)  Redness or swelling where the shot was given (about 1 person in 4)  Mild fever (rare)  Headache (about 1 person in 4)  Tiredness (about 1 person in 4)  Moderate problems following Td vaccine: (Interfered with activities, but did not require medical attention)  Fever over 102F (rare)  Severe problems following Td vaccine: (Unable to perform usual activities; required medical attention)  Swelling, severe pain, bleeding and/or redness in the arm where the shot was given (rare).  Problems that could happen after any vaccine:  People sometimes faint after a medical procedure, including vaccination. Sitting or lying down for about 15 minutes can help prevent fainting, and injuries caused by a fall. Tell your doctor if you feel dizzy, or have vision changes or ringing in the ears.  Some people get   severe pain in the shoulder and have difficulty moving the arm where a shot was given. This happens very rarely.  Any medication can cause a severe allergic reaction. Such reactions from a vaccine are very rare, estimated at fewer than 1 in a million doses, and would happen within a few minutes to a few hours after the vaccination. As with any  medicine, there is a very remote chance of a vaccine causing a serious injury or death. The safety of vaccines is always being monitored. For more information, visit: www.cdc.gov/vaccinesafety/ 5. What if there is a serious reaction? What should I look for? Look for anything that concerns you, such as signs of a severe allergic reaction, very high fever, or unusual behavior. Signs of a severe allergic reaction can include hives, swelling of the face and throat, difficulty breathing, a fast heartbeat, dizziness, and weakness. These would usually start a few minutes to a few hours after the vaccination. What should I do?  If you think it is a severe allergic reaction or other emergency that can't wait, call 9-1-1 or get the person to the nearest hospital. Otherwise, call your doctor.  Afterward, the reaction should be reported to the Vaccine Adverse Event Reporting System (VAERS). Your doctor might file this report, or you can do it yourself through the VAERS web site at www.vaers.hhs.gov, or by calling 1-800-822-7967. ? VAERS does not give medical advice. 6. The National Vaccine Injury Compensation Program The National Vaccine Injury Compensation Program (VICP) is a federal program that was created to compensate people who may have been injured by certain vaccines. Persons who believe they may have been injured by a vaccine can learn about the program and about filing a claim by calling 1-800-338-2382 or visiting the VICP website at www.hrsa.gov/vaccinecompensation. There is a time limit to file a claim for compensation. 7. How can I learn more?  Ask your doctor. He or she can give you the vaccine package insert or suggest other sources of information.  Call your local or state health department.  Contact the Centers for Disease Control and Prevention (CDC): ? Call 1-800-232-4636 (1-800-CDC-INFO) ? Visit CDC's website at www.cdc.gov/vaccines CDC Td Vaccine VIS (07/24/15) This information is  not intended to replace advice given to you by your health care provider. Make sure you discuss any questions you have with your health care provider. Document Released: 01/26/2006 Document Revised: 12/20/2015 Document Reviewed: 12/20/2015 Elsevier Interactive Patient Education  2017 Elsevier Inc.  

## 2018-02-06 LAB — HEPATIC FUNCTION PANEL
ALBUMIN: 4.2 g/dL (ref 3.5–5.5)
ALK PHOS: 301 IU/L — AB (ref 39–117)
ALT: 73 IU/L — AB (ref 0–44)
AST: 37 IU/L (ref 0–40)
Bilirubin Total: 0.4 mg/dL (ref 0.0–1.2)
Bilirubin, Direct: 0.17 mg/dL (ref 0.00–0.40)
Total Protein: 6.3 g/dL (ref 6.0–8.5)

## 2018-02-10 ENCOUNTER — Telehealth: Payer: Self-pay

## 2018-02-10 NOTE — Telephone Encounter (Signed)
Contacted pt to go over lab results pt didn't answer left a detailed vm informing pt of the results and if he has any questions or concerns to give me a call    If pt calls back please give results: liver enzymes continue to decrease towards the normal range. We will plan to recheck again on his next visit in December.

## 2018-02-16 ENCOUNTER — Other Ambulatory Visit: Payer: Self-pay | Admitting: Internal Medicine

## 2018-02-16 DIAGNOSIS — M160 Bilateral primary osteoarthritis of hip: Secondary | ICD-10-CM

## 2018-02-16 MED FILL — LIDOCAINE PATCH 5%: 5 | 30 days supply | Qty: 30 | Fill #0

## 2018-02-16 MED FILL — CARVEDILOL 3.125 MG TABLET: 3.125 | 30 days supply | Qty: 60 | Fill #1

## 2018-02-16 MED FILL — LISINOPRIL 10 MG TABS: 10 | 30 days supply | Qty: 60 | Fill #1

## 2018-02-18 ENCOUNTER — Other Ambulatory Visit: Payer: Self-pay | Admitting: Internal Medicine

## 2018-02-19 ENCOUNTER — Telehealth (INDEPENDENT_AMBULATORY_CARE_PROVIDER_SITE_OTHER): Payer: Self-pay | Admitting: Orthopaedic Surgery

## 2018-02-19 NOTE — Telephone Encounter (Signed)
Can you advise? 

## 2018-02-19 NOTE — Telephone Encounter (Signed)
Do you want him to come in to schedule?  Or ok to do over phone?

## 2018-02-19 NOTE — Telephone Encounter (Signed)
Patient called stated he wanted to move forward with surgery and wants that set up asap.  He received and injection on last appt and it hasn't helped much at all.  Please call patient to advise.  [phone] ok to leave message on VM

## 2018-02-21 NOTE — Telephone Encounter (Signed)
He should come in to see [place].  Looks like he would need cardiac clearance first.

## 2018-02-22 NOTE — Telephone Encounter (Signed)
Called Patient no answer. LMOM. He needs appt with Dr Erlinda Hong. Look Like he will need Cardiac clearance first per Dr Erlinda Hong. Will try to call him back again.

## 2018-02-22 NOTE — Telephone Encounter (Signed)
Called patient again no answer. Will try again later.

## 2018-02-24 NOTE — Telephone Encounter (Signed)
appt made for Tuesday.

## 2018-03-02 ENCOUNTER — Ambulatory Visit (INDEPENDENT_AMBULATORY_CARE_PROVIDER_SITE_OTHER): Payer: Medicaid Other | Admitting: Orthopaedic Surgery

## 2018-03-02 ENCOUNTER — Ambulatory Visit (INDEPENDENT_AMBULATORY_CARE_PROVIDER_SITE_OTHER): Payer: Medicaid Other

## 2018-03-02 ENCOUNTER — Encounter: Payer: Self-pay | Admitting: Cardiovascular Disease

## 2018-03-02 ENCOUNTER — Encounter (INDEPENDENT_AMBULATORY_CARE_PROVIDER_SITE_OTHER): Payer: Self-pay | Admitting: Orthopaedic Surgery

## 2018-03-02 DIAGNOSIS — M1611 Unilateral primary osteoarthritis, right hip: Secondary | ICD-10-CM

## 2018-03-02 DIAGNOSIS — M25551 Pain in right hip: Secondary | ICD-10-CM

## 2018-03-02 MED FILL — POTASSIUM CL ER 10 MEQ TAB: 10 | 30 days supply | Qty: 30 | Fill #0

## 2018-03-02 NOTE — Progress Notes (Signed)
Office Visit Note   Patient: Joshua [name]           Date of Birth: [birth year]           MRN: [id number] Visit Date: 03/02/2018              Requested by: Joshua Pier, MD [address] [place], [place] [address] PCP: Joshua Pier, MD   Assessment & Plan: Visit Diagnoses:  1. Primary osteoarthritis of right hip   2. Pain in right hip     Plan: Impression is end-stage right hip degenerative joint disease.  We discussed risks and benefits and rehab and recovery associated with the surgery.  Patient does have a cardiac history.  He recently saw Dr. Sallyanne [name] in September and was doing well.  He is currently just taking a baby aspirin.  We will need preop clearance from him prior to scheduling surgery.  The patient would like to have this done as soon as possible.  Follow-Up Instructions: Return if symptoms worsen or fail to improve.   Orders:  Orders Placed This Encounter  Procedures  . XR HIP UNILAT W OR W/O PELVIS 1V LEFT   No orders of the defined types were placed in this encounter.     Procedures: No procedures performed   Clinical Data: No additional findings.   Subjective: Chief Complaint  Patient presents with  . Right Hip - Pain    Joshua [name] is a very pleasant 57 year old gentleman who comes in for severe right hip degenerative joint disease.  He previously had a cortisone injection which gave him some partial relief but this relief has worn off and he is now interested in pursuing a right total hip replacement.  He ambulates with a walking stick.   Review of Systems  Constitutional: Negative.   All other systems reviewed and are negative.    Objective: Vital Signs: There were no vitals taken for this visit.  Physical Exam  Constitutional: He is oriented to person, place, and time. He appears well-developed and well-nourished.  Pulmonary/Chest: Effort normal.  Abdominal: Soft.  Neurological: He is alert and oriented to person, place,  and time.  Skin: Skin is warm.  Psychiatric: He has a normal mood and affect. His behavior is normal. Judgment and thought content normal.  Nursing note and vitals reviewed.   Ortho Exam Right hip exam shows limited range of motion.  Positive logroll.  Positive FADIR. Specialty Comments:  No specialty comments available.  Imaging: Xr Hip Unilat W Or W/o Pelvis 1v Left  Result Date: 03/02/2018 End-stage right hip degenerative joint disease    PMFS History: Patient Active Problem List   Diagnosis Date Noted  . Mild dilation of ascending aorta (HCC) 12/26/2017  . Dyslipidemia 12/26/2017  . Bilateral carotid bruits 12/26/2017  . Right knee pain 07/23/2017  . Pain in right hip 07/23/2017  . Nocturnal hypoxemia 06/29/2017  . Primary osteoarthritis of both hips 06/29/2017  . Chronic diastolic heart failure ([facility]) 04/27/2017  . Primary osteoarthritis of right hip 03/31/2017  . Coronary artery disease involving native coronary artery of native heart without angina pectoris 03/31/2017  . Ischemic cardiomyopathy 03/31/2017  . Marijuana use 03/31/2017  . Prediabetes 03/31/2017  . Aortic valve insufficiency 12/28/2016  . Mitral valve regurgitation 12/28/2016  . Obesity 12/28/2016  . Aortic valve stenosis, nonrheumatic   . 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 Essential hypertension    Past Medical History:  Diagnosis Date  . CAD (coronary artery disease), native coronary artery 12/26/2016   DES LAD & RCA, EF 25-30%  . Hypertension   . Obesity   . Osteoarthritis   . Pre-diabetes    a. prior A1C 5.6, states he was on meds for this at one point.    Family History  Problem Relation Age of Onset  . CAD Father        MI/CABG age 37    Past Surgical History:  Procedure Laterality Date  . CORONARY STENT INTERVENTION N/A 12/29/2016   Procedure: CORONARY STENT INTERVENTION;  Surgeon: Jettie Booze, MD;  Location: [facility];  Service: Cardiovascular;  Laterality: N/A;  . RIGHT/LEFT HEART CATH AND CORONARY ANGIOGRAPHY N/A 12/29/2016   Procedure: RIGHT/LEFT HEART CATH AND CORONARY ANGIOGRAPHY;  Surgeon: Jettie Booze, MD;  Location: [facility];  Service: Cardiovascular;  Laterality: N/A;   Social History   Occupational History  . Not on file  Tobacco Use  . Smoking status: Former Research scientist (life sciences)  . Smokeless tobacco: Never Used  . Tobacco comment: Smoked lightly for approx 20 years, quit 1990s  Substance and Sexual Activity  . Alcohol use: Yes    Comment: 1-2 mixed drinks per week  . Drug use: Yes    Types: Marijuana  . Sexual activity: Not on file

## 2018-03-18 MED FILL — CARVEDILOL 3.125 MG TABLET: 3.125 | 30 days supply | Qty: 60 | Fill #2

## 2018-03-18 MED FILL — LIDOCAINE PATCH 5%: 5 | 30 days supply | Qty: 30 | Fill #1

## 2018-03-18 MED FILL — LISINOPRIL 10 MG TABS: 10 | 30 days supply | Qty: 60 | Fill #2

## 2018-04-01 MED FILL — POTASSIUM CHLORIDE ER 10 ME: 10 | 30 days supply | Qty: 30 | Fill #0

## 2018-04-02 ENCOUNTER — Ambulatory Visit: Payer: Medicaid Other | Admitting: Internal Medicine

## 2018-04-20 ENCOUNTER — Other Ambulatory Visit: Payer: Self-pay | Admitting: Internal Medicine

## 2018-04-20 DIAGNOSIS — I10 Essential (primary) hypertension: Secondary | ICD-10-CM

## 2018-04-20 MED FILL — LISINOPRIL 10 MG TABS: 10 | 30 days supply | Qty: 60 | Fill #0

## 2018-04-20 MED FILL — LIDOCAINE PATCH 5%: 5 | 30 days supply | Qty: 30 | Fill #2

## 2018-04-20 MED FILL — CARVEDILOL 3.125 MG TABLET: 3.125 | 30 days supply | Qty: 60 | Fill #3

## 2018-04-22 NOTE — Pre-Procedure Instructions (Signed)
[place]  04/22/2018      [place], [address] [place] [address] Phone: [phone] Fax: [phone]    Your procedure is scheduled on Monday, January 13th.  Report to [facility] Admitting at 8:00 A.M.  Call this number if you have problems the morning of surgery:  [phone]   Remember:  Do not eat or drink after midnight.     Follow your surgeon's instructions on when to stop Asprin.  If no instructions were given by your surgeon then you will need to call the office to get those instructions.    7 days prior to surgery STOP taking any Aspirin (unless otherwise instructed by your surgeon), Aleve, Naproxen, Ibuprofen, Motrin, Advil, Goody's, BC's, all herbal medications, fish oil, and all vitamins.     Do not wear jewelry.  Do not wear lotions, powders, colognes, or deodorant.  Do not shave 48 hours prior to surgery.  Men may shave face and neck.  Do not bring valuables to the hospital.  [facility] is not responsible for any belongings or valuables.   [facility]- Preparing For Surgery  Before surgery, you can play an important role. Because skin is not sterile, your skin needs to be as free of germs as possible. You can reduce the number of germs on your skin by washing with CHG (chlorahexidine gluconate) Soap before surgery.  CHG is an antiseptic cleaner which kills germs and bonds with the skin to continue killing germs even after washing.    Oral Hygiene is also important to reduce your risk of infection.  Remember - BRUSH YOUR TEETH THE MORNING OF SURGERY WITH YOUR REGULAR TOOTHPASTE  Please do not use if you have an allergy to CHG or antibacterial soaps. If your skin becomes reddened/irritated stop using the CHG.  Do not shave (including legs and underarms) for at least 48 hours prior to first CHG shower. It is OK to shave your face.  Please follow these instructions  carefully.   1. Shower the NIGHT BEFORE SURGERY and the MORNING OF SURGERY with CHG.   2. If you chose to wash your hair, wash your hair first as usual with your normal shampoo.  3. After you shampoo, rinse your hair and body thoroughly to remove the shampoo.  4. Use CHG as you would any other liquid soap. You can apply CHG directly to the skin and wash gently with a scrungie or a clean washcloth.   5. Apply the CHG Soap to your body ONLY FROM THE NECK DOWN.  Do not use on open wounds or open sores. Avoid contact with your eyes, ears, mouth and genitals (private parts). Wash Face and genitals (private parts)  with your normal soap.  6. Wash thoroughly, paying special attention to the area where your surgery will be performed.  7. Thoroughly rinse your body with warm water from the neck down.  8. DO NOT shower/wash with your normal soap after using and rinsing off the CHG Soap.  9. Pat yourself dry with a CLEAN TOWEL.  10. Wear CLEAN PAJAMAS to bed the night before surgery, wear comfortable clothes the morning of surgery  11. Place CLEAN SHEETS on your bed the night of your first shower and DO NOT SLEEP WITH PETS.   Day of Surgery:  Do not apply any deodorants/lotions.  Please wear clean clothes to the hospital/surgery center.   Remember to  brush your teeth WITH YOUR REGULAR TOOTHPASTE.   Contacts, dentures or bridgework may not be worn into surgery.  Leave your suitcase in the car.  After surgery it may be brought to your room.  For patients admitted to the hospital, discharge time will be determined by your treatment team.  Patients discharged the day of surgery will not be allowed to drive home.   Please read over the following fact sheets that you were given. Coughing and Deep Breathing, MRSA Information and Surgical Site Infection Prevention

## 2018-04-23 ENCOUNTER — Encounter (HOSPITAL_COMMUNITY)
Admission: RE | Admit: 2018-04-23 | Discharge: 2018-04-23 | Disposition: A | Discharge status: Home / Self Care | Payer: Medicaid Other | Source: Ambulatory Visit | Attending: Orthopaedic Surgery | Admitting: Orthopaedic Surgery

## 2018-04-23 ENCOUNTER — Encounter (HOSPITAL_COMMUNITY): Payer: Self-pay

## 2018-04-23 ENCOUNTER — Other Ambulatory Visit: Payer: Self-pay

## 2018-04-23 ENCOUNTER — Ambulatory Visit (HOSPITAL_COMMUNITY)
Admission: RE | Admit: 2018-04-23 | Discharge: 2018-04-23 | Disposition: A | Discharge status: Home / Self Care | Payer: Medicaid Other | Source: Ambulatory Visit | Attending: Physician Assistant | Admitting: Physician Assistant

## 2018-04-23 DIAGNOSIS — M1611 Unilateral primary osteoarthritis, right hip: Secondary | ICD-10-CM

## 2018-04-23 DIAGNOSIS — R918 Other nonspecific abnormal finding of lung field: Secondary | ICD-10-CM | POA: Diagnosis not present

## 2018-04-23 LAB — TYPE AND SCREEN
ABO/RH(D): O POS
Antibody Screen: NEGATIVE

## 2018-04-23 LAB — COMPREHENSIVE METABOLIC PANEL
ALBUMIN: 3.5 g/dL (ref 3.5–5.0)
ALT: 58 U/L — ABNORMAL HIGH (ref 0–44)
AST: 32 U/L (ref 15–41)
Alkaline Phosphatase: 164 U/L — ABNORMAL HIGH (ref 38–126)
Anion gap: 7 (ref 5–15)
BUN: 16 mg/dL (ref 6–20)
CO2: 25 mmol/L (ref 22–32)
Calcium: 9.2 mg/dL (ref 8.9–10.3)
Chloride: 102 mmol/L (ref 98–111)
Creatinine, Ser: 0.8 mg/dL (ref 0.61–1.24)
GFR calc Af Amer: >60 mL/min (ref >60)
GFR calc non Af Amer: >60 mL/min (ref >60)
GLUCOSE: 102 mg/dL — AB (ref 70–99)
Potassium: 4.2 mmol/L (ref 3.5–5.1)
Sodium: 134 mmol/L — ABNORMAL LOW (ref 135–145)
Total Bilirubin: 0.9 mg/dL (ref 0.3–1.2)
Total Protein: 6.3 g/dL — ABNORMAL LOW (ref 6.5–8.1)

## 2018-04-23 LAB — ABO/RH: ABO/RH(D): O POS

## 2018-04-23 LAB — CBC WITH DIFFERENTIAL/PLATELET
Abs Immature Granulocytes: 0.02 10*3/uL (ref 0.00–0.07)
BASOS ABS: 0 10*3/uL (ref 0.0–0.1)
Basophils Relative: 1 %
Eosinophils Absolute: 0.1 10*3/uL (ref 0.0–0.5)
Eosinophils Relative: 2 %
HCT: 43.3 % (ref 39.0–52.0)
Hemoglobin: 14.3 g/dL (ref 13.0–17.0)
Immature Granulocytes: 0 %
LYMPHS PCT: 24 %
Lymphs Abs: 1.5 10*3/uL (ref 0.7–4.0)
MCH: 30.5 pg (ref 26.0–34.0)
MCHC: 33 g/dL (ref 30.0–36.0)
MCV: 92.3 fL (ref 80.0–100.0)
Monocytes Absolute: 0.9 10*3/uL (ref 0.1–1.0)
Monocytes Relative: 14 %
Neutro Abs: 3.7 10*3/uL (ref 1.7–7.7)
Neutrophils Relative %: 59 %
Platelets: 162 10*3/uL (ref 150–400)
RBC: 4.69 MIL/uL (ref 4.22–5.81)
RDW: 12.6 % (ref 11.5–15.5)
WBC: 6.3 10*3/uL (ref 4.0–10.5)
nRBC: 0 % (ref 0.0–0.2)

## 2018-04-23 LAB — SURGICAL PCR SCREEN
MRSA, PCR: NEGATIVE
Staphylococcus aureus: NEGATIVE

## 2018-04-23 LAB — APTT: aPTT: 30 seconds (ref 24–36)

## 2018-04-23 LAB — PROTIME-INR
INR: 1.02
Prothrombin Time: 13.4 seconds (ref 11.4–15.2)

## 2018-04-23 MED ORDER — TRANEXAMIC ACID-NACL 1000-0.7 MG/100ML-% IV SOLN
1000.0000 mg | INTRAVENOUS | Status: AC
  Administered 2018-04-26: 1000 mg via INTRAVENOUS
  Filled 2018-04-23: qty 100

## 2018-04-23 MED ORDER — TRANEXAMIC ACID 1000 MG/10ML IV SOLN
2000.0000 mg | INTRAVENOUS | Status: AC
  Administered 2018-04-26: 2000 mg via TOPICAL
  Filled 2018-04-23: qty 20

## 2018-04-23 NOTE — Progress Notes (Signed)
Pt stated he was given new prescriptions and currently taking PCN & Hydrocodone, since speaking with Pharmacy Tech.  [place] to get clarification on dosages.  Updated med req with prescription information.

## 2018-04-23 NOTE — Progress Notes (Signed)
PCP: Karle Plumber  Cardiologist: Croitoru  DM: denies  SA: denies  Pt denies SOB, cough, fever, chest pain  Pt stated understanding of instructions given for DOS.

## 2018-04-25 ENCOUNTER — Encounter (HOSPITAL_COMMUNITY): Payer: Self-pay | Admitting: Anesthesiology

## 2018-04-25 NOTE — Anesthesia Preprocedure Evaluation (Addendum)
Anesthesia Evaluation  Patient identified by MRN, date of birth, ID band Patient awake    Reviewed: Allergy & Precautions, NPO status , Patient's Chart, lab work & pertinent test results, reviewed documented beta blocker date and time   Airway Mallampati: II  TM Distance: >3 FB Neck ROM: Full    Dental  (+) Chipped,    Pulmonary former smoker,    Pulmonary exam normal breath sounds clear to auscultation       Cardiovascular hypertension, Pt. on medications and Pt. on home beta blockers + CAD, + Cardiac Stents (2018 DES to LAD and RCA) and +CHF  Normal cardiovascular exam Rhythm:Regular Rate:Normal  TTE 04/2017 EF 55-60%, no valvular abnormalities  LHC 2018 prior to stents Prox RCA lesion, 25 %stenosed. Dist RCA lesion, 25 %stenosed. Dist Cx lesion, 60 %stenosed. Prox Cx to Mid Cx lesion, 25 %stenosed. There is moderate to severe left ventricular systolic dysfunction. The left ventricular ejection fraction is 25-35% by visual estimate. Mid RCA lesion, 75 %stenosed. A STENT RESOLUTE ONYX 4.0X30 drug eluting stent was successfully placed. Mid LAD lesion, 75 %stenosed. A STENT RESOLUTE ONYX [id number] drug eluting stent was successfully placed, post dilated to > 3 mm. Post intervention, there is a 0% residual stenosis Severe two vessel CAD which likely contributed to cardiomyopathy.    Neuro/Psych negative neurological ROS  negative psych ROS   GI/Hepatic negative GI ROS, (+)     substance abuse  marijuana use,   Endo/Other  negative endocrine ROS  Renal/GU negative Renal ROS  negative genitourinary   Musculoskeletal  (+) Arthritis , Osteoarthritis,    Abdominal   Peds  Hematology   Anesthesia Other Findings   Reproductive/Obstetrics                           Anesthesia Physical Anesthesia Plan  ASA: III  Anesthesia Plan: Spinal and Regional   Post-op Pain Management:   Regional for Post-op pain   Induction: Intravenous  PONV Risk Score and Plan: 1 and Treatment may vary due to age or medical condition and Propofol infusion  Airway Management Planned: Natural Airway and Simple Face Mask  Additional Equipment:   Intra-op Plan:   Post-operative Plan:   Informed Consent: I have reviewed the patients History and Physical, chart, labs and discussed the procedure including the risks, benefits and alternatives for the proposed anesthesia with the patient or authorized representative who has indicated his/her understanding and acceptance.   Dental advisory given  Plan Discussed with: CRNA and Surgeon  Anesthesia Plan Comments:        Anesthesia Quick Evaluation

## 2018-04-26 ENCOUNTER — Other Ambulatory Visit: Payer: Self-pay

## 2018-04-26 ENCOUNTER — Observation Stay (HOSPITAL_COMMUNITY): Payer: Medicaid Other

## 2018-04-26 ENCOUNTER — Encounter (HOSPITAL_COMMUNITY): Payer: Self-pay | Admitting: Anesthesiology

## 2018-04-26 ENCOUNTER — Inpatient Hospital Stay (HOSPITAL_COMMUNITY): Payer: Medicaid Other

## 2018-04-26 ENCOUNTER — Inpatient Hospital Stay (HOSPITAL_COMMUNITY): Payer: Medicaid Other | Admitting: Anesthesiology

## 2018-04-26 ENCOUNTER — Observation Stay (HOSPITAL_COMMUNITY)
Admission: RE | Admit: 2018-04-26 | Discharge: 2018-04-27 | Disposition: A | Discharge status: Home / Self Care | Payer: Medicaid Other | Attending: Orthopaedic Surgery | Admitting: Orthopaedic Surgery

## 2018-04-26 ENCOUNTER — Encounter (HOSPITAL_COMMUNITY)
Admission: RE | Disposition: A | Discharge status: Home / Self Care | Payer: Self-pay | Source: Home / Self Care | Attending: Orthopaedic Surgery

## 2018-04-26 DIAGNOSIS — R2689 Other abnormalities of gait and mobility: Secondary | ICD-10-CM | POA: Diagnosis not present

## 2018-04-26 DIAGNOSIS — I2510 Atherosclerotic heart disease of native coronary artery without angina pectoris: Secondary | ICD-10-CM | POA: Diagnosis not present

## 2018-04-26 DIAGNOSIS — Z955 Presence of coronary angioplasty implant and graft: Secondary | ICD-10-CM | POA: Diagnosis not present

## 2018-04-26 DIAGNOSIS — I10 Essential (primary) hypertension: Secondary | ICD-10-CM | POA: Diagnosis not present

## 2018-04-26 DIAGNOSIS — Z79899 Other long term (current) drug therapy: Secondary | ICD-10-CM | POA: Insufficient documentation

## 2018-04-26 DIAGNOSIS — R7303 Prediabetes: Secondary | ICD-10-CM | POA: Diagnosis not present

## 2018-04-26 DIAGNOSIS — D62 Acute posthemorrhagic anemia: Secondary | ICD-10-CM | POA: Diagnosis not present

## 2018-04-26 DIAGNOSIS — G8918 Other acute postprocedural pain: Secondary | ICD-10-CM | POA: Diagnosis not present

## 2018-04-26 DIAGNOSIS — I110 Hypertensive heart disease with heart failure: Secondary | ICD-10-CM | POA: Diagnosis not present

## 2018-04-26 DIAGNOSIS — M1611 Unilateral primary osteoarthritis, right hip: Secondary | ICD-10-CM

## 2018-04-26 DIAGNOSIS — Z7982 Long term (current) use of aspirin: Secondary | ICD-10-CM | POA: Insufficient documentation

## 2018-04-26 DIAGNOSIS — Z471 Aftercare following joint replacement surgery: Secondary | ICD-10-CM | POA: Diagnosis not present

## 2018-04-26 DIAGNOSIS — I5040 Unspecified combined systolic (congestive) and diastolic (congestive) heart failure: Secondary | ICD-10-CM | POA: Diagnosis not present

## 2018-04-26 DIAGNOSIS — Z87891 Personal history of nicotine dependence: Secondary | ICD-10-CM | POA: Diagnosis not present

## 2018-04-26 DIAGNOSIS — Z96649 Presence of unspecified artificial hip joint: Secondary | ICD-10-CM

## 2018-04-26 DIAGNOSIS — Z96641 Presence of right artificial hip joint: Secondary | ICD-10-CM

## 2018-04-26 DIAGNOSIS — Z419 Encounter for procedure for purposes other than remedying health state, unspecified: Secondary | ICD-10-CM

## 2018-04-26 HISTORY — PX: TOTAL HIP ARTHROPLASTY: SHX124

## 2018-04-26 SURGERY — ARTHROPLASTY, HIP, TOTAL, ANTERIOR APPROACH
Anesthesia: Spinal | Laterality: Right

## 2018-04-26 MED ORDER — SODIUM CHLORIDE 0.9 % IR SOLN
Status: DC | PRN
  Administered 2018-04-26: 3000 mL

## 2018-04-26 MED ORDER — BUPIVACAINE IN DEXTROSE 0.75-8.25 % IT SOLN
INTRATHECAL | Status: DC | PRN
  Administered 2018-04-26: 2 mL via INTRATHECAL

## 2018-04-26 MED ORDER — DIPHENHYDRAMINE HCL 12.5 MG/5ML PO ELIX: 25.0000 mg | ORAL_SOLUTION | ORAL | Status: DC | PRN

## 2018-04-26 MED ORDER — BSS IO SOLN
15.0000 mL | Freq: Once | INTRAOCULAR | Status: AC
  Administered 2018-04-26: 15 mL
  Filled 2018-04-26: qty 15

## 2018-04-26 MED ORDER — LISINOPRIL 10 MG PO TABS
10.0000 mg | ORAL_TABLET | Freq: Two times a day (BID) | ORAL | Status: DC
  Administered 2018-04-26 – 2018-04-27 (×2): 10 mg via ORAL
  Filled 2018-04-26 (×2): qty 1

## 2018-04-26 MED ORDER — GABAPENTIN 300 MG PO CAPS
300.0000 mg | ORAL_CAPSULE | Freq: Three times a day (TID) | ORAL | Status: DC
  Administered 2018-04-26 – 2018-04-27 (×3): 300 mg via ORAL
  Filled 2018-04-26 (×3): qty 1

## 2018-04-26 MED ORDER — METOCLOPRAMIDE HCL 5 MG/ML IJ SOLN: 5.0000 mg | Freq: Three times a day (TID) | INTRAMUSCULAR | Status: DC | PRN

## 2018-04-26 MED ORDER — SORBITOL 70 % SOLN: 30.0000 mL | Freq: Every day | Status: DC | PRN

## 2018-04-26 MED ORDER — SODIUM CHLORIDE 0.9 % IV SOLN
INTRAVENOUS | Status: DC | PRN
  Administered 2018-04-26: 40 ug/min via INTRAVENOUS

## 2018-04-26 MED ORDER — ALUM & MAG HYDROXIDE-SIMETH 200-200-20 MG/5ML PO SUSP: 30.0000 mL | ORAL | Status: DC | PRN

## 2018-04-26 MED ORDER — ACETAMINOPHEN 10 MG/ML IV SOLN
INTRAVENOUS | Status: AC
  Filled 2018-04-26: qty 100

## 2018-04-26 MED ORDER — CHLORHEXIDINE GLUCONATE 4 % EX LIQD: 60.0000 mL | Freq: Once | CUTANEOUS | Status: DC

## 2018-04-26 MED ORDER — AMOXICILLIN 500 MG PO CAPS: 500.0000 mg | ORAL_CAPSULE | Freq: Four times a day (QID) | ORAL | 0 refills | Status: DC

## 2018-04-26 MED ORDER — EPINEPHRINE PF 1 MG/10ML IJ SOSY
PREFILLED_SYRINGE | INTRAMUSCULAR | Status: DC | PRN
  Administered 2018-04-26: 100 ug via SUBCUTANEOUS

## 2018-04-26 MED ORDER — KETOROLAC TROMETHAMINE 0.5 % OP SOLN
1.0000 [drp] | Freq: Three times a day (TID) | OPHTHALMIC | Status: DC | PRN
  Filled 2018-04-26 (×2): qty 5

## 2018-04-26 MED ORDER — MAGNESIUM CITRATE PO SOLN: 1.0000 | Freq: Once | ORAL | Status: DC | PRN

## 2018-04-26 MED ORDER — METHOCARBAMOL 750 MG PO TABS: 750.0000 mg | ORAL_TABLET | Freq: Two times a day (BID) | ORAL | 0 refills | Status: DC | PRN

## 2018-04-26 MED ORDER — CLONIDINE HCL (ANALGESIA) 100 MCG/ML EP SOLN
EPIDURAL | Status: DC | PRN
  Administered 2018-04-26: 50 ug

## 2018-04-26 MED ORDER — CEFAZOLIN SODIUM-DEXTROSE 2-4 GM/100ML-% IV SOLN
2.0000 g | Freq: Four times a day (QID) | INTRAVENOUS | Status: AC
  Administered 2018-04-26 – 2018-04-27 (×3): 2 g via INTRAVENOUS
  Filled 2018-04-26 (×3): qty 100

## 2018-04-26 MED ORDER — ACETAMINOPHEN 325 MG PO TABS: 325.0000 mg | ORAL_TABLET | Freq: Four times a day (QID) | ORAL | Status: DC | PRN

## 2018-04-26 MED ORDER — HYDROMORPHONE HCL 1 MG/ML IJ SOLN
0.5000 mg | INTRAMUSCULAR | Status: DC | PRN
  Administered 2018-04-26: 1 mg via INTRAVENOUS
  Filled 2018-04-26: qty 1

## 2018-04-26 MED ORDER — ACETAMINOPHEN 10 MG/ML IV SOLN
INTRAVENOUS | Status: DC | PRN
  Administered 2018-04-26: 1000 mg via INTRAVENOUS

## 2018-04-26 MED ORDER — OXYCODONE HCL 5 MG PO TABS
ORAL_TABLET | ORAL | Status: AC
  Filled 2018-04-26: qty 1

## 2018-04-26 MED ORDER — METHOCARBAMOL 500 MG PO TABS
ORAL_TABLET | ORAL | Status: AC
  Filled 2018-04-26: qty 1

## 2018-04-26 MED ORDER — KETOROLAC TROMETHAMINE 15 MG/ML IJ SOLN
30.0000 mg | Freq: Four times a day (QID) | INTRAMUSCULAR | Status: DC
  Administered 2018-04-26 – 2018-04-27 (×3): 30 mg via INTRAVENOUS
  Filled 2018-04-26 (×3): qty 2

## 2018-04-26 MED ORDER — ONDANSETRON HCL 4 MG/2ML IJ SOLN
INTRAMUSCULAR | Status: DC | PRN
  Administered 2018-04-26: 4 mg via INTRAVENOUS

## 2018-04-26 MED ORDER — METOCLOPRAMIDE HCL 5 MG PO TABS: 5.0000 mg | ORAL_TABLET | Freq: Three times a day (TID) | ORAL | Status: DC | PRN

## 2018-04-26 MED ORDER — FENTANYL CITRATE (PF) 100 MCG/2ML IJ SOLN
INTRAMUSCULAR | Status: AC
  Administered 2018-04-26: 25 ug via INTRAVENOUS
  Filled 2018-04-26: qty 2

## 2018-04-26 MED ORDER — ASPIRIN 81 MG PO CHEW
81.0000 mg | CHEWABLE_TABLET | Freq: Two times a day (BID) | ORAL | Status: DC
  Administered 2018-04-26 – 2018-04-27 (×3): 81 mg via ORAL
  Filled 2018-04-26 (×3): qty 1

## 2018-04-26 MED ORDER — OXYCODONE HCL 5 MG PO TABS
10.0000 mg | ORAL_TABLET | ORAL | Status: DC | PRN
  Administered 2018-04-26 – 2018-04-27 (×2): 15 mg via ORAL
  Filled 2018-04-26 (×2): qty 3

## 2018-04-26 MED ORDER — DEXAMETHASONE SODIUM PHOSPHATE 10 MG/ML IJ SOLN
10.0000 mg | Freq: Once | INTRAMUSCULAR | Status: AC
  Administered 2018-04-27: 10 mg via INTRAVENOUS
  Filled 2018-04-26: qty 1

## 2018-04-26 MED ORDER — VANCOMYCIN HCL 1000 MG IV SOLR
INTRAVENOUS | Status: AC
  Filled 2018-04-26: qty 1000

## 2018-04-26 MED ORDER — SODIUM CHLORIDE 0.9 % IV SOLN
INTRAVENOUS | Status: DC
  Administered 2018-04-26: 21:00:00 via INTRAVENOUS

## 2018-04-26 MED ORDER — MIDAZOLAM HCL 2 MG/2ML IJ SOLN
INTRAMUSCULAR | Status: AC
  Administered 2018-04-26: 2 mg
  Filled 2018-04-26: qty 2

## 2018-04-26 MED ORDER — LACTATED RINGERS IV SOLN: INTRAVENOUS | Status: DC

## 2018-04-26 MED ORDER — MENTHOL 3 MG MT LOZG: 1.0000 | LOZENGE | OROMUCOSAL | Status: DC | PRN

## 2018-04-26 MED ORDER — LACTATED RINGERS IV SOLN
INTRAVENOUS | Status: DC
  Administered 2018-04-26 (×2): via INTRAVENOUS

## 2018-04-26 MED ORDER — ONDANSETRON HCL 4 MG PO TABS: 4.0000 mg | ORAL_TABLET | Freq: Three times a day (TID) | ORAL | 0 refills | Status: DC | PRN

## 2018-04-26 MED ORDER — METHOCARBAMOL 1000 MG/10ML IJ SOLN
500.0000 mg | Freq: Four times a day (QID) | INTRAVENOUS | Status: DC | PRN
  Filled 2018-04-26: qty 5

## 2018-04-26 MED ORDER — VANCOMYCIN HCL POWD
Status: DC | PRN
  Administered 2018-04-26: 1000 mg via TOPICAL

## 2018-04-26 MED ORDER — PENICILLIN V POTASSIUM 250 MG PO TABS
500.0000 mg | ORAL_TABLET | Freq: Four times a day (QID) | ORAL | Status: DC
  Filled 2018-04-26 (×2): qty 2

## 2018-04-26 MED ORDER — PROMETHAZINE HCL 25 MG PO TABS: 25.0000 mg | ORAL_TABLET | Freq: Four times a day (QID) | ORAL | 1 refills | Status: DC | PRN

## 2018-04-26 MED ORDER — PHENOL 1.4 % MT LIQD: 1.0000 | OROMUCOSAL | Status: DC | PRN

## 2018-04-26 MED ORDER — ONDANSETRON HCL 4 MG PO TABS: 4.0000 mg | ORAL_TABLET | Freq: Four times a day (QID) | ORAL | Status: DC | PRN

## 2018-04-26 MED ORDER — FENTANYL CITRATE (PF) 100 MCG/2ML IJ SOLN
INTRAMUSCULAR | Status: AC
  Filled 2018-04-26: qty 2

## 2018-04-26 MED ORDER — OXYCODONE HCL 5 MG PO TABS: 5.0000 mg | ORAL_TABLET | ORAL | 0 refills | Status: DC | PRN

## 2018-04-26 MED ORDER — ASPIRIN EC 81 MG PO TBEC: 81.0000 mg | DELAYED_RELEASE_TABLET | Freq: Two times a day (BID) | ORAL | 0 refills | Status: DC

## 2018-04-26 MED ORDER — TRANEXAMIC ACID-NACL 1000-0.7 MG/100ML-% IV SOLN
1000.0000 mg | Freq: Once | INTRAVENOUS | Status: AC
  Administered 2018-04-26: 1000 mg via INTRAVENOUS
  Filled 2018-04-26: qty 100

## 2018-04-26 MED ORDER — POTASSIUM CHLORIDE CRYS ER 10 MEQ PO TBCR
10.0000 meq | EXTENDED_RELEASE_TABLET | Freq: Every day | ORAL | Status: DC
  Administered 2018-04-27: 10 meq via ORAL
  Filled 2018-04-26 (×2): qty 1

## 2018-04-26 MED ORDER — PROPOFOL 1000 MG/100ML IV EMUL
INTRAVENOUS | Status: AC
  Filled 2018-04-26: qty 200

## 2018-04-26 MED ORDER — BUPIVACAINE HCL (PF) 0.5 % IJ SOLN
INTRAMUSCULAR | Status: DC | PRN
  Administered 2018-04-26: 30 mL via PERINEURAL

## 2018-04-26 MED ORDER — FENTANYL CITRATE (PF) 100 MCG/2ML IJ SOLN
25.0000 ug | INTRAMUSCULAR | Status: DC | PRN
  Administered 2018-04-26: 25 ug via INTRAVENOUS

## 2018-04-26 MED ORDER — DOCUSATE SODIUM 100 MG PO CAPS
100.0000 mg | ORAL_CAPSULE | Freq: Two times a day (BID) | ORAL | Status: DC
  Administered 2018-04-26 – 2018-04-27 (×2): 100 mg via ORAL
  Filled 2018-04-26 (×2): qty 1

## 2018-04-26 MED ORDER — PROPOFOL 500 MG/50ML IV EMUL
INTRAVENOUS | Status: DC | PRN
  Administered 2018-04-26: 50 ug/kg/min via INTRAVENOUS

## 2018-04-26 MED ORDER — METHOCARBAMOL 500 MG PO TABS
500.0000 mg | ORAL_TABLET | Freq: Four times a day (QID) | ORAL | Status: DC | PRN
  Administered 2018-04-26 – 2018-04-27 (×4): 500 mg via ORAL
  Filled 2018-04-26 (×3): qty 1

## 2018-04-26 MED ORDER — CEFAZOLIN SODIUM-DEXTROSE 2-4 GM/100ML-% IV SOLN
2.0000 g | INTRAVENOUS | Status: AC
  Administered 2018-04-26: 2 g via INTRAVENOUS
  Filled 2018-04-26: qty 100

## 2018-04-26 MED ORDER — OXYCODONE HCL ER 10 MG PO T12A
10.0000 mg | EXTENDED_RELEASE_TABLET | Freq: Two times a day (BID) | ORAL | Status: DC
  Administered 2018-04-26 – 2018-04-27 (×2): 10 mg via ORAL
  Filled 2018-04-26: qty 1

## 2018-04-26 MED ORDER — CARVEDILOL 3.125 MG PO TABS
3.1250 mg | ORAL_TABLET | Freq: Two times a day (BID) | ORAL | Status: DC
  Administered 2018-04-26 – 2018-04-27 (×2): 3.125 mg via ORAL
  Filled 2018-04-26 (×2): qty 1

## 2018-04-26 MED ORDER — 0.9 % SODIUM CHLORIDE (POUR BTL) OPTIME
TOPICAL | Status: DC | PRN
  Administered 2018-04-26: 1000 mL

## 2018-04-26 MED ORDER — POLYETHYLENE GLYCOL 3350 17 G PO PACK
17.0000 g | PACK | Freq: Every day | ORAL | Status: DC | PRN
  Administered 2018-04-27: 17 g via ORAL
  Filled 2018-04-26: qty 1

## 2018-04-26 MED ORDER — OXYCODONE HCL 5 MG PO TABS
5.0000 mg | ORAL_TABLET | ORAL | Status: DC | PRN
  Administered 2018-04-26 – 2018-04-27 (×3): 5 mg via ORAL
  Filled 2018-04-26 (×3): qty 1

## 2018-04-26 MED ORDER — ACETAMINOPHEN 500 MG PO TABS
1000.0000 mg | ORAL_TABLET | Freq: Four times a day (QID) | ORAL | Status: DC
  Administered 2018-04-26 – 2018-04-27 (×3): 1000 mg via ORAL
  Filled 2018-04-26 (×3): qty 2

## 2018-04-26 MED ORDER — SENNOSIDES-DOCUSATE SODIUM 8.6-50 MG PO TABS: 1.0000 | ORAL_TABLET | Freq: Every evening | ORAL | 1 refills | Status: DC | PRN

## 2018-04-26 MED ORDER — ONDANSETRON HCL 4 MG/2ML IJ SOLN: 4.0000 mg | Freq: Four times a day (QID) | INTRAMUSCULAR | Status: DC | PRN

## 2018-04-26 MED FILL — Vancomycin HCl For IV Soln 1 GM (Base Equivalent): INTRAVENOUS | Qty: 1000 | Status: AC

## 2018-04-26 SURGICAL SUPPLY — 59 items
ACETAB CUP W GRIPTION 54MM (Plate) ×1 IMPLANT
ACETAB CUP W/GRIPTION 54 (Plate) ×2 IMPLANT
BAG DECANTER FOR FLEXI CONT (MISCELLANEOUS) ×6 IMPLANT
CELLS DAT CNTRL 66122 CELL SVR (MISCELLANEOUS) IMPLANT
CLOSURE STERI-STRIP 1/2X4 (GAUZE/BANDAGES/DRESSINGS) ×1
CLSR STERI-STRIP ANTIMIC 1/2X4 (GAUZE/BANDAGES/DRESSINGS) ×2 IMPLANT
COVER PERINEAL POST (MISCELLANEOUS) ×3 IMPLANT
COVER SURGICAL LIGHT HANDLE (MISCELLANEOUS) ×3 IMPLANT
COVER WAND RF STERILE (DRAPES) ×3 IMPLANT
CUP ACETAB W/GRIPTION 54 (Plate) ×1 IMPLANT
DRAPE C-ARM 42X72 X-RAY (DRAPES) ×3 IMPLANT
DRAPE POUCH INSTRU U-SHP 10X18 (DRAPES) ×3 IMPLANT
DRAPE STERI IOBAN 125X83 (DRAPES) ×3 IMPLANT
DRAPE U-SHAPE 47X51 STRL (DRAPES) ×6 IMPLANT
DRSG AQUACEL AG ADV 3.5X10 (GAUZE/BANDAGES/DRESSINGS) ×3 IMPLANT
DURAPREP 26ML APPLICATOR (WOUND CARE) ×6 IMPLANT
ELECT BLADE 4.0 EZ CLEAN MEGAD (MISCELLANEOUS) ×3
ELECT REM PT RETURN 9FT ADLT (ELECTROSURGICAL) ×3
ELECTRODE BLDE 4.0 EZ CLN MEGD (MISCELLANEOUS) ×1 IMPLANT
ELECTRODE REM PT RTRN 9FT ADLT (ELECTROSURGICAL) ×1 IMPLANT
GLOVE BIOGEL PI IND STRL 7.0 (GLOVE) ×1 IMPLANT
GLOVE BIOGEL PI INDICATOR 7.0 (GLOVE) ×2
GLOVE ECLIPSE 7.0 STRL STRAW (GLOVE) ×6 IMPLANT
GLOVE SKINSENSE NS SZ7.5 (GLOVE) ×2
GLOVE SKINSENSE STRL SZ7.5 (GLOVE) ×1 IMPLANT
GLOVE SURG SYN 7.5  E (GLOVE) ×8
GLOVE SURG SYN 7.5 E (GLOVE) ×4 IMPLANT
GOWN SRG XL XLNG 56XLVL 4 (GOWN DISPOSABLE) ×1 IMPLANT
GOWN STRL NON-REIN XL XLG LVL4 (GOWN DISPOSABLE) ×2
GOWN STRL REUS W/ TWL LRG LVL3 (GOWN DISPOSABLE) IMPLANT
GOWN STRL REUS W/ TWL XL LVL3 (GOWN DISPOSABLE) ×1 IMPLANT
GOWN STRL REUS W/TWL LRG LVL3 (GOWN DISPOSABLE)
GOWN STRL REUS W/TWL XL LVL3 (GOWN DISPOSABLE) ×2
HANDPIECE INTERPULSE COAX TIP (DISPOSABLE) ×2
HEAD CERAMIC 36 PLUS 8.5 12 14 (Hips) ×3 IMPLANT
HOOD PEEL AWAY FLYTE STAYCOOL (MISCELLANEOUS) ×6 IMPLANT
IV NS IRRIG 3000ML ARTHROMATIC (IV SOLUTION) ×3 IMPLANT
KIT BASIN OR (CUSTOM PROCEDURE TRAY) ×3 IMPLANT
LINER NEUTRAL 54X36MM PLUS 4 (Hips) ×3 IMPLANT
MARKER SKIN DUAL TIP RULER LAB (MISCELLANEOUS) ×3 IMPLANT
NEEDLE SPNL 18GX3.5 QUINCKE PK (NEEDLE) ×3 IMPLANT
NS IRRIG 1000ML POUR BTL (IV SOLUTION) ×3 IMPLANT
PACK TOTAL JOINT (CUSTOM PROCEDURE TRAY) ×3 IMPLANT
PACK UNIVERSAL I (CUSTOM PROCEDURE TRAY) ×3 IMPLANT
RTRCTR WOUND ALEXIS 18CM MED (MISCELLANEOUS)
SAW OSC TIP CART 19.5X105X1.3 (SAW) ×3 IMPLANT
SET HNDPC FAN SPRY TIP SCT (DISPOSABLE) ×1 IMPLANT
STAPLER VISISTAT 35W (STAPLE) IMPLANT
STEM FEM SZ3 STD ACTIS (Stem) ×3 IMPLANT
SUT ETHIBOND 2 V 37 (SUTURE) ×3 IMPLANT
SUT MNCRL AB 3-0 PS2 18 (SUTURE) ×3 IMPLANT
SUT VIC AB 1 CT1 27 (SUTURE) ×2
SUT VIC AB 1 CT1 27XBRD ANBCTR (SUTURE) ×1 IMPLANT
SUT VIC AB 2-0 CT1 27 (SUTURE) ×4
SUT VIC AB 2-0 CT1 TAPERPNT 27 (SUTURE) ×2 IMPLANT
SYRINGE 60CC LL (MISCELLANEOUS) ×3 IMPLANT
TOWEL OR 17X26 10 PK STRL BLUE (TOWEL DISPOSABLE) ×3 IMPLANT
TRAY CATH 16FR W/PLASTIC CATH (SET/KITS/TRAYS/PACK) IMPLANT
YANKAUER SUCT BULB TIP NO VENT (SUCTIONS) ×3 IMPLANT

## 2018-04-26 NOTE — Anesthesia Procedure Notes (Signed)
Spinal  Patient location during procedure: OR Start time: 04/26/2018 9:27 AM End time: 04/26/2018 9:31 AM Staffing Anesthesiologist: Josephine Igo, MD Preanesthetic Checklist Completed: patient identified, site marked, surgical consent, pre-op evaluation, timeout performed, IV checked, risks and benefits discussed and monitors and equipment checked Spinal Block Patient position: sitting Prep: DuraPrep Patient monitoring: heart rate, cardiac monitor, continuous pulse ox and blood pressure Approach: midline Location: L4-5 Injection technique: single-shot Needle Needle type: Pencan  Needle gauge: 24 G Needle length: 9 cm Needle insertion depth: 7 cm Assessment Sensory level: T4 Additional Notes Patient tolerated the procedure well. Adequate sensory level.

## 2018-04-26 NOTE — Discharge Instructions (Signed)

## 2018-04-26 NOTE — Transfer of Care (Signed)
Immediate Anesthesia Transfer of Care Note  Patient: Joshua [name]  Procedure(s) Performed: RIGHT TOTAL HIP ARTHROPLASTY ANTERIOR APPROACH (Right )  Patient Location: PACU  Anesthesia Type:MAC, Regional and Spinal  Level of Consciousness: awake, alert , oriented and patient cooperative  Airway & Oxygen Therapy: Patient Spontanous Breathing and Patient connected to nasal cannula oxygen  Post-op Assessment: Report given to RN and Post -op Vital signs reviewed and stable  Post vital signs: Reviewed and stable  Last Vitals:  Vitals Value Taken Time  BP 109/59 04/26/2018 11:41 AM  Temp    Pulse 73 04/26/2018 11:41 AM  Resp 12 04/26/2018 11:41 AM  SpO2 100 % 04/26/2018 11:41 AM  Vitals shown include unvalidated device data.  Last Pain:  Vitals:   04/26/18 0752  TempSrc: Oral  PainSc:       Patients Stated Pain Goal: 3 (65/53/74 8270)  Complications: No apparent anesthesia complications

## 2018-04-26 NOTE — Progress Notes (Signed)
Anesthesiology note:  Joshua [name] is a 58 year old man who underwent right total hip arthroplasty today by Dr. Erlinda Hong under spinal anesthesia.  In the recovery room he is now complaining of a scratchy sensation in his right eye.  He states he may have scratched his right eye with his hospital identification bracelet.  On examination there is mild erythema of the right conjunctiva.  His vision is normal in the both eyes.  Impression: Possible mild right corneal abrasion.  Plan: 1.  Irrigate right out with BSS solution. 2.  Ketorolac eyedrops to right eye every 8 hours 3.  Follow-up in a.m. if his symptoms persist or worsen consider ophthalmology consult.  Roberts Gaudy, MD

## 2018-04-26 NOTE — Plan of Care (Signed)
  Problem: Education: Goal: Knowledge of General Education information will improve Description Including pain rating scale, medication(s)/side effects and non-pharmacologic comfort measures Outcome: Progressing   Problem: Clinical Measurements: Goal: Ability to maintain clinical measurements within normal limits will improve Outcome: Progressing Goal: Will remain free from infection Outcome: Progressing   Problem: Nutrition: Goal: Adequate nutrition will be maintained Outcome: Progressing   Problem: Safety: Goal: Ability to remain free from injury will improve Outcome: Progressing   

## 2018-04-26 NOTE — H&P (Signed)
PREOPERATIVE H&P  Chief Complaint: right hip degenerative joint disease  HPI: Joshua [name] is a 58 y.o. male who presents for surgical treatment of right hip degenerative joint disease.  He denies any changes in medical history.  Past Medical History:  Diagnosis Date  . CAD (coronary artery disease), native coronary artery 12/26/2016   DES LAD & RCA, EF 25-30%  . Hypertension   . Obesity   . Osteoarthritis   . Pre-diabetes    a. prior A1C 5.6, states he was on meds for this at one point.   Past Surgical History:  Procedure Laterality Date  . CORONARY STENT INTERVENTION N/A 12/29/2016   Procedure: CORONARY STENT INTERVENTION;  Surgeon: Jettie Booze, MD;  Location: [facility];  Service: Cardiovascular;  Laterality: N/A;  . RIGHT/LEFT HEART CATH AND CORONARY ANGIOGRAPHY N/A 12/29/2016   Procedure: RIGHT/LEFT HEART CATH AND CORONARY ANGIOGRAPHY;  Surgeon: Jettie Booze, MD;  Location: [facility];  Service: Cardiovascular;  Laterality: N/A;   Social History   Socioeconomic History  . Marital status: Single    Spouse name: Not on file  . Number of children: Not on file  . Years of education: Not on file  . Highest education level: Not on file  Occupational History  . Not on file  Social Needs  . Financial resource strain: Not on file  . Food insecurity:    Worry: Not on file    Inability: Not on file  . Transportation needs:    Medical: Not on file    Non-medical: Not on file  Tobacco Use  . Smoking status: Former Research scientist (life sciences)  . Smokeless tobacco: Never Used  . Tobacco comment: Smoked lightly for approx 20 years, quit 1990s  Substance and Sexual Activity  . Alcohol use: Yes    Comment: 1-2 mixed drinks per week  . Drug use: Yes    Types: Marijuana  . Sexual activity: Not on file  Lifestyle  . Physical activity:    Days per week: Not on file    Minutes per session: Not on file  . Stress: Not on file  Relationships  . Social  connections:    Talks on phone: Not on file    Gets together: Not on file    Attends religious service: Not on file    Active member of club or organization: Not on file    Attends meetings of clubs or organizations: Not on file    Relationship status: Not on file  Other Topics Concern  . Not on file  Social History Narrative   Lives Home alone. Sister makes all health related decisions. Declined Advanced directive at this time   Family History  Problem Relation Age of Onset  . CAD Father        MI/CABG age 65   No Known Allergies Prior to Admission medications   Medication Sig Start Date End Date Taking? Authorizing Provider  aspirin EC 81 MG tablet Take 81 mg by mouth daily.   Yes [provider]  aspirin-acetaminophen-caffeine (EXCEDRIN MIGRAINE) [phone] MG tablet Take 1 tablet by mouth daily as needed for headache.   Yes [provider]  carvedilol (COREG) 3.125 MG tablet Take 1 tablet (3.125 mg total) by mouth 2 (two) times daily. 12/25/17 12/20/18 Yes Croitoru, Mihai, MD  lidocaine (LIDODERM) 5 % PLACE 1 PATCH ONTO THE SKIN DAILY. REMOVE & DISCARD PATCH WITHIN 12 HOURS OR AS DIRECTED BY DOCTOR. Patient taking differently: Place  1 patch onto the skin daily as needed (pain).  02/16/18  Yes Ladell Pier, MD  potassium chloride (K-DUR) 10 MEQ tablet TAKE 1 TABLET BY MOUTH DAILY. 02/19/18  Yes Ladell Pier, MD  HYDROcodone-acetaminophen (NORCO/VICODIN) 5-325 MG tablet Take 1 tablet by mouth every 6 (six) hours as needed for moderate pain.    [provider]  lisinopril (PRINIVIL,ZESTRIL) 10 MG tablet TAKE 1 TABLET BY MOUTH 2 TIMES DAILY. 04/20/18   Ladell Pier, MD  penicillin v potassium (VEETID) 500 MG tablet Take 500 mg by mouth 4 (four) times daily. Take (1) tab 4x/day until gone    [provider]     Positive ROS: All other systems have been reviewed and were otherwise negative with the exception of those mentioned in the HPI  and as above.  Physical Exam: General: Alert, no acute distress Cardiovascular: No pedal edema Respiratory: No cyanosis, no use of accessory musculature GI: abdomen soft Skin: No lesions in the area of chief complaint Neurologic: Sensation intact distally Psychiatric: Patient is competent for consent with normal mood and affect Lymphatic: no lymphedema  MUSCULOSKELETAL: exam stable  Assessment: right hip degenerative joint disease  Plan: Plan for Procedure(s): RIGHT TOTAL HIP ARTHROPLASTY ANTERIOR APPROACH  The risks benefits and alternatives were discussed with the patient including but not limited to the risks of nonoperative treatment, versus surgical intervention including infection, bleeding, nerve injury,  blood clots, cardiopulmonary complications, morbidity, mortality, among others, and they were willing to proceed.   Preoperative templating of the joint replacement has been completed, documented, and submitted to the Operating Room personnel in order to optimize intra-operative equipment management.  Eduard Roux, MD   04/26/2018 7:45 AM

## 2018-04-26 NOTE — Anesthesia Procedure Notes (Signed)
Anesthesia Regional Block: Quadratus lumborum   Pre-Anesthetic Checklist: ,, timeout performed, Correct Patient, Correct Site, Correct Laterality, Correct Procedure, Correct Position, site marked, Risks and benefits discussed,  Surgical consent,  Pre-op evaluation,  At surgeon's request and post-op pain management  Laterality: Right  Prep: Maximum Sterile Barrier Precautions used, chloraprep       Needles:  Injection technique: Single-shot  Needle Type: Echogenic Stimulator Needle     Needle Length: 9cm  Needle Gauge: 22     Additional Needles:   Procedures:,,,, ultrasound used (permanent image in chart),,,,  Narrative:  Start time: 04/26/2018 8:54 AM End time: 04/26/2018 9:04 AM Injection made incrementally with aspirations every 5 mL.  Performed by: Personally  Anesthesiologist: Freddrick March, MD  Additional Notes: Monitors applied. No increased pain on injection. No increased resistance to injection. Injection made in 5cc increments. Good needle visualization. Patient tolerated procedure well.

## 2018-04-26 NOTE — Op Note (Signed)
RIGHT TOTAL HIP ARTHROPLASTY ANTERIOR APPROACH  Procedure Note Joshua [name]   [id number]  Pre-op Diagnosis: right hip degenerative joint disease     Post-op Diagnosis: same   Operative Procedures  1. Total hip replacement; Right hip; uncemented [id number]   Personnel  Surgeon(s): Leandrew Koyanagi, MD  Assist: Madalyn Rob, PA-C; necessary for the timely completion of procedure and due to complexity of procedure.   Anesthesia: spinal  Prosthesis: Depuy Acetabulum: Pinnacle 54 mm Femur: Actis 3 STD Head: 36 mm size: +8.5 Liner: +4 neutral Bearing Type: ceramic on poly  Total Hip Arthroplasty (Anterior Approach) Op Note:  After informed consent was obtained and the operative extremity marked in the holding area, the patient was brought back to the operating room and placed supine on the HANA table. Next, the operative extremity was prepped and draped in normal sterile fashion. Surgical timeout occurred verifying patient identification, surgical site, surgical procedure and administration of antibiotics.  A modified anterior Smith-Peterson approach to the hip was performed, using the interval between tensor fascia lata and sartorius.  Dissection was carried bluntly down onto the anterior hip capsule. The lateral femoral circumflex vessels were identified and coagulated. A capsulotomy was performed and the capsular flaps tagged for later repair.  Fluoroscopy was utilized to prepare for the femoral neck cut. The neck osteotomy was performed. The femoral head was removed, the acetabular rim was cleared of soft tissue and attention was turned to reaming the acetabulum.  Sequential reaming was performed under fluoroscopic guidance. We reamed to a size 53 mm, and then impacted the acetabular shell. The liner was then placed after irrigation and attention turned to the femur.  After placing the femoral hook, the leg was taken to externally rotated, extended and adducted position  taking care to perform soft tissue releases to allow for adequate mobilization of the femur. Soft tissue was cleared from the shoulder of the greater trochanter and the hook elevator used to improve exposure of the proximal femur. Sequential broaching performed up to a size 3. Trial neck and head were placed. The leg was brought back up to neutral and the construct reduced. The position and sizing of components, offset and leg lengths were checked using fluoroscopy. Stability of the  construct was checked in extension and external rotation without any subluxation or impingement of prosthesis. We dislocated the prosthesis, dropped the leg back into position, removed trial components, and irrigated copiously. The final stem and head was then placed, the leg brought back up, the system reduced and fluoroscopy used to verify positioning.  We irrigated, obtained hemostasis and closed the capsule using #2 ethibond suture.  One gram of vancomycin powder was placed in the surgical bed. The fascia was closed with #1 vicryl plus, the deep fat layer was closed with 0 vicryl, the subcutaneous layers closed with 2.0 Vicryl Plus and the skin closed with 3.0 monocryl and steri strips. A sterile dressing was applied. The patient was awakened in the operating room and taken to recovery in stable condition.  All sponge, needle, and instrument counts were correct at the end of the case.   Position: supine  Complications: none.  Time Out: performed   Drains/Packing: none  Estimated blood loss: see anesthesia record  Returned to Recovery Room: in good condition.   Antibiotics: yes   Mechanical VTE (DVT) Prophylaxis: sequential compression devices, TED thigh-high  Chemical VTE (DVT) Prophylaxis: aspirin   Fluid Replacement: see anesthesia record  Specimens Removed: 1 to pathology  Sponge and Instrument Count Correct? yes   PACU: portable radiograph - low AP   Plan/RTC: Return in 2 weeks for staple  removal. Weight Bearing/Load Lower Extremity: full  Hip precautions: none Suture Removal: 2 weeks   N. Eduard Roux, MD [place] 11:11 AM     Implant Name Type Inv. Item Serial No. Manufacturer Lot No. LRB No. Used  ACETABULAR CUP Daisy Blossom 54MM - [id number] Plate ACETABULAR CUP W GRIPTION 54MM  DEPUY SYNTHES [id number] Right 1  LINER NEUTRAL 54X36MM PLUS 4 - [id number] Hips LINER NEUTRAL 54X36MM PLUS 4  DEPUY SYNTHES [id number] Right 1  HEAD CERAMIC 36 PLUS 8.5 12 14  - [id number] Hips HEAD CERAMIC 36 PLUS 8.5 12 14   DEPUY SYNTHES [id number] Right 1  STEM FEM SZ3 STD ACTIS - [id number] Stem STEM FEM SZ3 STD ACTIS  DEPUY ORTHOPAEDICS [id number] Right 1

## 2018-04-26 NOTE — Evaluation (Signed)
Physical Therapy Evaluation Patient Details Name: Joshua [name] MRN: [id number] DOB: [birth year] Today's Date: 04/26/2018   History of Present Illness  Pt is a 58 y/o male s/p elective R THA, direct anterior. PMH includes CAD s/p stent, HTN, and pre-diabetes.   Clinical Impression  Pt s/p surgery above with deficits below. Pt requiring min guard to min A for mobility using RW this session. Pt had just received extensive medication, so RN requesting to limit mobility to chair. Educated about supine HEP. Will continue to follow acutely to maximize functional mobility independence and safety.     Follow Up Recommendations Follow surgeon's recommendation for DC plan and follow-up therapies;Supervision for mobility/OOB    Equipment Recommendations  3in1 (PT)    Recommendations for Other Services       Precautions / Restrictions Precautions Precautions: Fall Restrictions Weight Bearing Restrictions: Yes RLE Weight Bearing: Weight bearing as tolerated      Mobility  Bed Mobility Overal bed mobility: Needs Assistance Bed Mobility: Supine to Sit     Supine to sit: Supervision     General bed mobility comments: Supervision for safety. Increased time and effort required to come to EOB.   Transfers Overall transfer level: Needs assistance Equipment used: Rolling walker (2 wheeled) Transfers: Sit to/from Stand Sit to Stand: Min assist;From elevated surface         General transfer comment: Min A for lift assist and steadying. Verbal cues for safe hand placement.   Ambulation/Gait Ambulation/Gait assistance: Min guard Gait Distance (Feet): 5 Feet Assistive device: Rolling walker (2 wheeled) Gait Pattern/deviations: Step-to pattern;Decreased step length - right;Decreased step length - left;Decreased weight shift to right;Antalgic Gait velocity: Decreased    General Gait Details: Slow, antalgic gait. Cues for sequencing using RW. RN requesting to limit ambulation, as pt  had just recieved extensive medications. Pt asymptomatic throughout gait.   Stairs            Wheelchair Mobility    Modified Rankin (Stroke Patients Only)       Balance Overall balance assessment: Needs assistance Sitting-balance support: Feet supported;No upper extremity supported Sitting balance-Leahy Scale: Good     Standing balance support: Bilateral upper extremity supported;During functional activity Standing balance-Leahy Scale: Poor Standing balance comment: Reliant on BUE support                              Pertinent Vitals/Pain Pain Assessment: Faces Faces Pain Scale: Hurts a little bit Pain Location: R hip  Pain Descriptors / Indicators: Aching;Operative site guarding Pain Intervention(s): Monitored during session;Limited activity within patient's tolerance;Repositioned    Home Living Family/patient expects to be discharged to:: Private residence Living Arrangements: Alone Available Help at Discharge: Family Type of Home: House Home Access: Stairs to enter Entrance Stairs-Rails: None Entrance Stairs-Number of Steps: 2 Home Layout: One level Home Equipment: Environmental consultant - 2 wheels;Cane - single point;Toilet riser Additional Comments: Discussed need for assist at d/c; reports sisters can help.     Prior Function Level of Independence: Independent with assistive device(s)         Comments: Used cane vs RW depending on the day.      Hand Dominance        Extremity/Trunk Assessment   Upper Extremity Assessment Upper Extremity Assessment: Overall WFL for tasks assessed    Lower Extremity Assessment Lower Extremity Assessment: RLE deficits/detail RLE Deficits / Details: Deficits consistent with post op pain and weakness. Able to  perform ther ex below. Sensory in tact.     Cervical / Trunk Assessment Cervical / Trunk Assessment: Normal  Communication   Communication: No difficulties  Cognition Arousal/Alertness:  Awake/alert Behavior During Therapy: WFL for tasks assessed/performed Overall Cognitive Status: Within Functional Limits for tasks assessed                                        General Comments General comments (skin integrity, edema, etc.): Pt's sisters and father present during session     Exercises Total Joint Exercises Ankle Circles/Pumps: AROM;Both;20 reps;Supine Quad Sets: AROM;Right;10 reps;Supine Heel Slides: AROM;Right;10 reps;Supine   Assessment/Plan    PT Assessment Patient needs continued PT services  PT Problem List Decreased strength;Decreased balance;Decreased mobility;Decreased knowledge of use of DME;Decreased knowledge of precautions;Decreased range of motion;Pain       PT Treatment Interventions DME instruction;Gait training;Therapeutic exercise;Therapeutic activities;Balance training;Patient/family education;Functional mobility training;Stair training    PT Goals (Current goals can be found in the Care Plan section)  Acute Rehab PT Goals Patient Stated Goal: to go home PT Goal Formulation: With patient Time For Goal Achievement: 05/10/18 Potential to Achieve Goals: Good    Frequency 7X/week   Barriers to discharge        Co-evaluation               AM-PAC PT "6 Clicks" Mobility  Outcome Measure Help needed turning from your back to your side while in a flat bed without using bedrails?: None Help needed moving from lying on your back to sitting on the side of a flat bed without using bedrails?: None Help needed moving to and from a bed to a chair (including a wheelchair)?: A Little Help needed standing up from a chair using your arms (e.g., wheelchair or bedside chair)?: A Little Help needed to walk in hospital room?: A Little Help needed climbing 3-5 steps with a railing? : A Lot 6 Click Score: 19    End of Session Equipment Utilized During Treatment: Gait belt Activity Tolerance: Patient tolerated treatment well Patient  left: in chair;with call bell/phone within reach;with family/visitor present Nurse Communication: Mobility status PT Visit Diagnosis: Other abnormalities of gait and mobility (R26.89);Pain Pain - Right/Left: Right Pain - part of body: Hip    Time: 1642-1700 PT Time Calculation (min) (ACUTE ONLY): 18 min   Charges:   PT Evaluation $PT Eval Low Complexity: [place], PT, DPT  Acute Rehabilitation Services  Pager: [phone] Office: [phone]   Rudean Hitt 04/26/2018, 5:15 PM

## 2018-04-26 NOTE — Anesthesia Postprocedure Evaluation (Signed)
Anesthesia Post Note  Patient: Khani Paino Nish  Procedure(s) Performed: RIGHT TOTAL HIP ARTHROPLASTY ANTERIOR APPROACH (Right )     Patient location during evaluation: PACU Anesthesia Type: Spinal Level of consciousness: awake and alert and oriented Pain management: pain level controlled Vital Signs Assessment: post-procedure vital signs reviewed and stable Respiratory status: spontaneous breathing, nonlabored ventilation and respiratory function stable Cardiovascular status: blood pressure returned to baseline and stable Postop Assessment: no apparent nausea or vomiting, spinal receding, patient able to bend at knees, no backache and no headache Anesthetic complications: no      Last Pain:  Vitals:   04/26/18 2056  TempSrc: Oral  PainSc:                  Horst Ostermiller A.

## 2018-04-27 ENCOUNTER — Encounter (HOSPITAL_COMMUNITY): Payer: Self-pay | Admitting: Orthopaedic Surgery

## 2018-04-27 DIAGNOSIS — M1611 Unilateral primary osteoarthritis, right hip: Secondary | ICD-10-CM | POA: Diagnosis not present

## 2018-04-27 DIAGNOSIS — Z96649 Presence of unspecified artificial hip joint: Secondary | ICD-10-CM | POA: Diagnosis not present

## 2018-04-27 DIAGNOSIS — R0902 Hypoxemia: Secondary | ICD-10-CM | POA: Diagnosis not present

## 2018-04-27 LAB — CBC
HCT: 37 % — ABNORMAL LOW (ref 39.0–52.0)
Hemoglobin: 12.4 g/dL — ABNORMAL LOW (ref 13.0–17.0)
MCH: 31 pg (ref 26.0–34.0)
MCHC: 33.5 g/dL (ref 30.0–36.0)
MCV: 92.5 fL (ref 80.0–100.0)
NRBC: 0 % (ref 0.0–0.2)
Platelets: 123 10*3/uL — ABNORMAL LOW (ref 150–400)
RBC: 4 MIL/uL — ABNORMAL LOW (ref 4.22–5.81)
RDW: 12.7 % (ref 11.5–15.5)
WBC: 6.5 10*3/uL (ref 4.0–10.5)

## 2018-04-27 LAB — BASIC METABOLIC PANEL
Anion gap: 6 (ref 5–15)
BUN: 7 mg/dL (ref 6–20)
CO2: 26 mmol/L (ref 22–32)
Calcium: 8.3 mg/dL — ABNORMAL LOW (ref 8.9–10.3)
Chloride: 106 mmol/L (ref 98–111)
Creatinine, Ser: 0.73 mg/dL (ref 0.61–1.24)
GFR calc Af Amer: >60 mL/min (ref >60)
GFR calc non Af Amer: >60 mL/min (ref >60)
Glucose, Bld: 132 mg/dL — ABNORMAL HIGH (ref 70–99)
POTASSIUM: 4 mmol/L (ref 3.5–5.1)
SODIUM: 138 mmol/L (ref 135–145)

## 2018-04-27 MED FILL — PROMETHAZINE 25 MG TABLET: 25 | 7 days supply | Qty: 30 | Fill #0

## 2018-04-27 MED FILL — ONDANSETRON HCL 4 MG TABLET: 4 | 6 days supply | Qty: 40 | Fill #0

## 2018-04-27 MED FILL — METHOCARBAMOL 750 MG TABS: 750 | 30 days supply | Qty: 60 | Fill #0

## 2018-04-27 MED FILL — AMOXICILLIN 500 MG CAPSULE: 500 | 10 days supply | Qty: 40 | Fill #0

## 2018-04-27 NOTE — Plan of Care (Signed)

## 2018-04-27 NOTE — Progress Notes (Signed)
Physical Therapy Treatment Patient Details Name: Joshua [name] MRN: [id number] DOB: [birth year] Today's Date: 04/27/2018    History of Present Illness Pt is a 58 y/o male s/p elective R THA, direct anterior. PMH includes CAD s/p stent, HTN, and pre-diabetes.     PT Comments    Patient seen for LE strengthening and ROM exercises and review of HEP handout and ambulation schedule. Patient is making good progress with PT.  From a mobility standpoint anticipate patient will be ready for DC home when medically ready.    Follow Up Recommendations  Follow surgeon's recommendation for DC plan and follow-up therapies;Supervision for mobility/OOB     Equipment Recommendations  3in1 (PT)    Recommendations for Other Services       Precautions / Restrictions Precautions Precautions: Fall Restrictions Weight Bearing Restrictions: Yes RLE Weight Bearing: Weight bearing as tolerated    Mobility  Bed Mobility Overal bed mobility: Modified Independent Bed Mobility: Supine to Sit           General bed mobility comments: pt OOB in chair upon arrival  Transfers Overall transfer level: Needs assistance Equipment used: Rolling walker (2 wheeled) Transfers: Sit to/from Stand Sit to Stand: Supervision         General transfer comment: supervision for safety  Ambulation/Gait Ambulation/Gait assistance: Supervision Gait Distance (Feet): 15 Feet Assistive device: Rolling walker (2 wheeled) Gait Pattern/deviations: Step-through pattern;Trunk flexed Gait velocity: Decreased    General Gait Details: cues for posture   Stairs Stairs: Yes Stairs assistance: Min assist Stair Management: No rails;Step to pattern;Backwards;With walker Number of Stairs: (2 steps X 2 trials) General stair comments: cues for sequencing and technique; assist to stabilize RW    Wheelchair Mobility    Modified Rankin (Stroke Patients Only)       Balance Overall balance assessment: Needs  assistance Sitting-balance support: Feet supported;No upper extremity supported Sitting balance-Leahy Scale: Good     Standing balance support: Bilateral upper extremity supported;During functional activity Standing balance-Leahy Scale: Fair Standing balance comment: pt is able to static stand without UE support                             Cognition Arousal/Alertness: Awake/alert Behavior During Therapy: WFL for tasks assessed/performed Overall Cognitive Status: Within Functional Limits for tasks assessed                                        Exercises Total Joint Exercises Quad Sets: Strengthening;Both;10 reps Short Arc Quad: Strengthening;Right;10 reps Heel Slides: AAROM;Strengthening;Right;10 reps Hip ABduction/ADduction: Strengthening;Right;10 reps;Supine;Standing Long Arc Quad: Strengthening;Right;10 reps;Seated Knee Flexion: Strengthening;Right;10 reps;Standing Marching in Standing: Strengthening;Right;10 reps;Standing Standing Hip Extension: Strengthening;Right;10 reps;Standing    General Comments        Pertinent Vitals/Pain Pain Assessment: Faces Faces Pain Scale: Hurts a little bit Pain Location: R hip  Pain Descriptors / Indicators: Discomfort Pain Intervention(s): Monitored during session;Repositioned    Home Living                      Prior Function            PT Goals (current goals can now be found in the care plan section) Acute Rehab PT Goals Patient Stated Goal: to go home Progress towards PT goals: Progressing toward goals    Frequency    7X/week  PT Plan Current plan remains appropriate    Co-evaluation              AM-PAC PT "6 Clicks" Mobility   Outcome Measure  Help needed turning from your back to your side while in a flat bed without using bedrails?: None Help needed moving from lying on your back to sitting on the side of a flat bed without using bedrails?: None Help needed  moving to and from a bed to a chair (including a wheelchair)?: A Little Help needed standing up from a chair using your arms (e.g., wheelchair or bedside chair)?: A Little Help needed to walk in hospital room?: A Little Help needed climbing 3-5 steps with a railing? : A Little 6 Click Score: 20    End of Session Equipment Utilized During Treatment: Gait belt Activity Tolerance: Patient tolerated treatment well Patient left: in chair;with call bell/phone within reach Nurse Communication: Mobility status PT Visit Diagnosis: Other abnormalities of gait and mobility (R26.89);Pain Pain - Right/Left: Right Pain - part of body: Hip     Time: 9507-2257 PT Time Calculation (min) (ACUTE ONLY): 20 min  Charges:  $Gait Training: 8-22 mins $Therapeutic Exercise: 8-22 mins                     Earney Navy, PTA Acute Rehabilitation Services Pager: [phone] Office: [phone]     Darliss Cheney 04/27/2018, 11:45 AM

## 2018-04-27 NOTE — Progress Notes (Signed)
Subjective: 1 Day Post-Op Procedure(s) (LRB): RIGHT TOTAL HIP ARTHROPLASTY ANTERIOR APPROACH (Right) Patient reports pain as mild.  Doing well this am.   Objective: Vital signs in last 24 hours: Temp:  [97.5 F (36.4 C)-98.9 F (37.2 C)] 98.9 F (37.2 C) (01/14 0506) Pulse Rate:  [50-73] 71 (01/14 0506) Resp:  [12-23] 16 (01/14 0506) BP: (88-126)/(29-69) 117/64 (01/14 0506) SpO2:  [97 %-100 %] 99 % (01/14 0506) Weight:  [90.3 kg] 90.3 kg (01/13 0752)  Intake/Output from previous day: 01/13 0701 - 01/14 0700 In: 3054.6 [P.O.:240; I.V.:2761; IV Piggyback:53.6] Out: 0300 [Urine:3055; Blood:200] Intake/Output this shift: No intake/output data recorded.  Recent Labs    04/27/18 0358  HGB 12.4*   Recent Labs    04/27/18 0358  WBC 6.5  RBC 4.00*  HCT 37.0*  PLT 123*   Recent Labs    04/27/18 0358  NA 138  K 4.0  CL 106  CO2 26  BUN 7  CREATININE 0.73  GLUCOSE 132*  CALCIUM 8.3*   No results for input(s): LABPT, INR in the last 72 hours.  Neurologically intact Neurovascular intact Sensation intact distally Intact pulses distally Dorsiflexion/Plantar flexion intact Incision: dressing C/D/I No cellulitis present Compartment soft    Assessment/Plan: 1 Day Post-Op Procedure(s) (LRB): RIGHT TOTAL HIP ARTHROPLASTY ANTERIOR APPROACH (Right) Advance diet Up with therapy D/C IV fluids Discharge home with home health after second PT session as long as he mobilizes well and feels comfortable going home WBAT RLE- no precautions ABLA- mild and stable    Aundra Dubin 04/27/2018, 7:38 AM

## 2018-04-27 NOTE — Progress Notes (Signed)
Pt alert,a ble to make needs known. Denies pain at this time. Up with rolling walker. DME present in room. Pt and sister verbalized understanding of discharge teaching. Pt escorted to private vehicle by staff, without incident. All personal possessions, and medication prescriptions sent with pt on discharge.

## 2018-04-27 NOTE — Progress Notes (Signed)
Physical Therapy Treatment Patient Details Name: Joshua [name] MRN: [id number] DOB: [birth year] Today's Date: 04/27/2018    History of Present Illness Pt is a 58 y/o male s/p elective R THA, direct anterior. PMH includes CAD s/p stent, HTN, and pre-diabetes.     PT Comments    Patient is progressing well toward PT goals and tolerated gait and stair training well. Second session will focus on therex. Current plan remains appropriate.    Follow Up Recommendations  Follow surgeon's recommendation for DC plan and follow-up therapies;Supervision for mobility/OOB     Equipment Recommendations  3in1 (PT)    Recommendations for Other Services       Precautions / Restrictions Precautions Precautions: Fall Restrictions RLE Weight Bearing: Weight bearing as tolerated    Mobility  Bed Mobility Overal bed mobility: Modified Independent Bed Mobility: Supine to Sit           General bed mobility comments: use of rails; increased time and effort needed  Transfers Overall transfer level: Needs assistance Equipment used: Rolling walker (2 wheeled) Transfers: Sit to/from Stand Sit to Stand: Min guard         General transfer comment: min guard for safety; cues for safe hand placement; no physical assist needed  Ambulation/Gait Ambulation/Gait assistance: Supervision Gait Distance (Feet): 200 Feet Assistive device: Rolling walker (2 wheeled) Gait Pattern/deviations: Step-through pattern;Decreased stride length;Trunk flexed Gait velocity: Decreased    General Gait Details: cues for posture, less UE weight bearing, and step length symmetry; stride length and cadence improving with distance   Stairs Stairs: Yes Stairs assistance: Min assist Stair Management: No rails;Step to pattern;Backwards;With walker Number of Stairs: (2 steps X 2 trials) General stair comments: cues for sequencing and technique; assist to stabilize RW    Wheelchair Mobility    Modified Rankin  (Stroke Patients Only)       Balance Overall balance assessment: Needs assistance Sitting-balance support: Feet supported;No upper extremity supported Sitting balance-Leahy Scale: Good     Standing balance support: Bilateral upper extremity supported;During functional activity Standing balance-Leahy Scale: Fair Standing balance comment: pt is able to static stand without UE support                             Cognition Arousal/Alertness: Awake/alert Behavior During Therapy: WFL for tasks assessed/performed Overall Cognitive Status: Within Functional Limits for tasks assessed                                        Exercises      General Comments        Pertinent Vitals/Pain Pain Assessment: No/denies pain Pain Intervention(s): Monitored during session    Home Living                      Prior Function            PT Goals (current goals can now be found in the care plan section) Acute Rehab PT Goals Patient Stated Goal: to go home Progress towards PT goals: Progressing toward goals    Frequency    7X/week      PT Plan Current plan remains appropriate    Co-evaluation              AM-PAC PT "6 Clicks" Mobility   Outcome Measure  Help needed turning from your back  to your side while in a flat bed without using bedrails?: None Help needed moving from lying on your back to sitting on the side of a flat bed without using bedrails?: None Help needed moving to and from a bed to a chair (including a wheelchair)?: A Little Help needed standing up from a chair using your arms (e.g., wheelchair or bedside chair)?: A Little Help needed to walk in hospital room?: A Little Help needed climbing 3-5 steps with a railing? : A Little 6 Click Score: 20    End of Session Equipment Utilized During Treatment: Gait belt Activity Tolerance: Patient tolerated treatment well Patient left: in chair;with call bell/phone within  reach Nurse Communication: Mobility status PT Visit Diagnosis: Other abnormalities of gait and mobility (R26.89);Pain Pain - Right/Left: Right Pain - part of body: Hip     Time: 2426-8341 PT Time Calculation (min) (ACUTE ONLY): 21 min  Charges:  $Gait Training: 8-22 mins                     Earney Navy, PTA Acute Rehabilitation Services Pager: [phone] Office: [phone]     Darliss Cheney 04/27/2018, 9:10 AM

## 2018-04-27 NOTE — Care Management Note (Signed)
Case Management Note Cross Coverage for 5N on 04/27/18 [place], [place] [phone]  Patient Details  Name: Joshua [name] MRN: [id number] Date of Birth: Sep 18, 1960  Subjective/Objective:   Pt presented s/p right total hip                  Action/Plan: PTA pt lived at home, order placed for HHPT and DME RW/3n1- notified by Tiffany with [facility]- pt has pre-op referral per surgeon office for [facility] needs- call made to [facility] with [facility] for DME needs- RW and 3n1 to be delivered to room prior to discharge.   Expected Discharge Date:  04/27/18               Expected Discharge Plan:  [place]  In-House Referral:     Discharge planning Services  CM Consult  Post Acute Care Choice:  Durable Medical Equipment, Home Health Choice offered to:  Patient  DME Arranged:  3-N-1, Walker rolling DME Agency:  [place]:  PT [place]:  [facility]  Status of Service:  Completed, signed off  If discussed at [employer] of Stay Meetings, dates discussed:   Discharge Disposition: home/home health    Additional Comments:  Dawayne Patricia, RN 04/27/2018, 11:32 AM

## 2018-04-27 NOTE — Discharge Summary (Signed)
Patient ID: Joshua [name] MRN: [id number] DOB/AGE: 01/06/1961 58 y.o.  Admit date: 04/26/2018 Discharge date: 04/27/2018  Admission Diagnoses:  Principal Problem:   Primary osteoarthritis of right hip Active Problems:   History of total hip replacement   Discharge Diagnoses:  Same  Past Medical History:  Diagnosis Date  . CAD (coronary artery disease), native coronary artery 12/26/2016   DES LAD & RCA, EF 25-30%  . Hypertension   . Obesity   . Osteoarthritis   . Pre-diabetes    a. prior A1C 5.6, states he was on meds for this at one point.    Surgeries: Procedure(s): RIGHT TOTAL HIP ARTHROPLASTY ANTERIOR APPROACH on 04/26/2018   Consultants:   Discharged Condition: Improved  Hospital Course: Joshua [name] is an 58 y.o. male who was admitted 04/26/2018 for operative treatment ofPrimary osteoarthritis of right hip. Patient has severe unremitting pain that affects sleep, daily activities, and work/hobbies. After pre-op clearance the patient was taken to the operating room on 04/26/2018 and underwent  Procedure(s): RIGHT TOTAL HIP ARTHROPLASTY ANTERIOR APPROACH.    Patient was given perioperative antibiotics:  Anti-infectives (From admission, onward)   Start     Dose/Rate Route Frequency Ordered Stop   04/27/18 1400  penicillin v potassium (VEETID) tablet 500 mg    Note to Pharmacy:  Take (1) tab 4x/day until gone     500 mg Oral 4 times daily 04/26/18 1544 05/01/18 0959   04/26/18 1600  ceFAZolin (ANCEF) IVPB 2g/100 mL premix     2 g 200 mL/hr over 30 Minutes Intravenous Every 6 hours 04/26/18 1544 04/27/18 0524   04/26/18 0745  ceFAZolin (ANCEF) IVPB 2g/100 mL premix     2 g 200 mL/hr over 30 Minutes Intravenous On call to O.R. 04/26/18 2841 04/26/18 0947   04/26/18 0000  amoxicillin (AMOXIL) 500 MG capsule     500 mg Oral 4 times daily 04/26/18 0834         Patient was given sequential compression devices, early ambulation, and chemoprophylaxis to prevent  DVT.  Patient benefited maximally from hospital stay and there were no complications.    Recent vital signs:  Patient Vitals for the past 24 hrs:  BP Temp Temp src Pulse Resp SpO2 Height Weight  04/27/18 0506 117/64 98.9 F (37.2 C) Oral 71 16 99 % - -  04/26/18 2056 116/65 98.1 F (36.7 C) Oral 67 18 97 % - -  04/26/18 1520 126/64 97.8 F (36.6 C) - - - - - -  04/26/18 1510 107/60 (!) 97.5 F (36.4 C) - 62 20 99 % - -  04/26/18 1455 112/60 - - 60 17 99 % - -  04/26/18 1410 116/64 - - (!) 54 13 100 % - -  04/26/18 1355 114/67 - - (!) 51 12 100 % - -  04/26/18 1340 112/63 - - (!) 55 12 100 % - -  04/26/18 1335 - - - (!) 55 12 100 % - -  04/26/18 1330 - - - (!) 55 13 100 % - -  04/26/18 1325 109/65 - - (!) 56 14 100 % - -  04/26/18 1310 (!) 100/58 - - (!) 54 13 100 % - -  04/26/18 1255 98/60 - - (!) 58 15 100 % - -  04/26/18 1221 (!) 94/57 - - (!) 52 12 100 % - -  04/26/18 1211 (!) 88/48 - - (!) 57 14 100 % - -  04/26/18 1200 94/66 - - 64 13  100 % - -  04/26/18 1141 (!) 109/59 (!) 97.5 F (36.4 C) - 73 12 100 % - -  04/26/18 0910 117/64 - - (!) 59 13 99 % - -  04/26/18 0905 (!) 120/42 - - (!) 54 13 100 % - -  04/26/18 0900 (!) 106/42 - - (!) 50 15 100 % - -  04/26/18 0855 (!) 100/29 - - (!) 52 15 100 % - -  04/26/18 0850 126/69 - - (!) 54 (!) 23 100 % - -  04/26/18 0752 120/66 97.7 F (36.5 C) Oral (!) 59 18 99 % 6\' 1"  (1.854 m) 90.3 kg  04/26/18 0749 - - - - - - 6' 0.5" (1.842 m) 90.3 kg     Recent laboratory studies:  Recent Labs    04/27/18 0358  WBC 6.5  HGB 12.4*  HCT 37.0*  PLT 123*  NA 138  K 4.0  CL 106  CO2 26  BUN 7  CREATININE 0.73  GLUCOSE 132*  CALCIUM 8.3*     Discharge Medications:   Allergies as of 04/27/2018   No Known Allergies     Medication List    STOP taking these medications   aspirin-acetaminophen-caffeine 250-250-65 MG tablet Commonly known as:  EXCEDRIN MIGRAINE   HYDROcodone-acetaminophen 5-325 MG tablet Commonly known as:   NORCO/VICODIN     TAKE these medications   amoxicillin 500 MG capsule Commonly known as:  AMOXIL Take 1 capsule (500 mg total) by mouth 4 (four) times daily.   aspirin EC 81 MG tablet Take 1 tablet (81 mg total) by mouth 2 (two) times daily. What changed:  when to take this   carvedilol 3.125 MG tablet Commonly known as:  COREG Take 1 tablet (3.125 mg total) by mouth 2 (two) times daily.   lidocaine 5 % Commonly known as:  LIDODERM PLACE 1 PATCH ONTO THE SKIN DAILY. REMOVE & DISCARD PATCH WITHIN 12 HOURS OR AS DIRECTED BY DOCTOR. What changed:  See the new instructions.   lisinopril 10 MG tablet Commonly known as:  PRINIVIL,ZESTRIL TAKE 1 TABLET BY MOUTH 2 TIMES DAILY.   methocarbamol 750 MG tablet Commonly known as:  ROBAXIN Take 1 tablet (750 mg total) by mouth 2 (two) times daily as needed for muscle spasms.   ondansetron 4 MG tablet Commonly known as:  ZOFRAN Take 1-2 tablets (4-8 mg total) by mouth every 8 (eight) hours as needed for nausea or vomiting.   oxyCODONE 5 MG immediate release tablet Commonly known as:  Oxy IR/ROXICODONE Take 1-3 tablets (5-15 mg total) by mouth every 4 (four) hours as needed.   penicillin v potassium 500 MG tablet Commonly known as:  VEETID Take 500 mg by mouth 4 (four) times daily. Take (1) tab 4x/day until gone   potassium chloride 10 MEQ tablet Commonly known as:  K-DUR TAKE 1 TABLET BY MOUTH DAILY.   promethazine 25 MG tablet Commonly known as:  PHENERGAN Take 1 tablet (25 mg total) by mouth every 6 (six) hours as needed for nausea.   senna-docusate 8.6-50 MG tablet Commonly known as:  SENOKOT S Take 1 tablet by mouth at bedtime as needed.            Durable Medical Equipment  (From admission, onward)         Start     Ordered   04/26/18 1544  DME Walker rolling  Once    Question:  Patient needs a walker to treat with the following condition  Answer:  History of hip replacement   04/26/18 1544   04/26/18 1544   DME 3 n 1  Once     04/26/18 1544   04/26/18 1544  DME Bedside commode  Once    Question:  Patient needs a bedside commode to treat with the following condition  Answer:  History of hip replacement   04/26/18 1544          Diagnostic Studies: Dg Chest 2 View  Result Date: 04/23/2018 CLINICAL DATA:  Pre operative images for hip replacement surgery. EXAM: CHEST - 2 VIEW COMPARISON:  12/26/2016 FINDINGS: Both lungs are clear. Negative for a pneumothorax. Heart and mediastinum are within normal limits. Atherosclerotic calcifications at the aortic arch. Trachea is midline. IMPRESSION: No active cardiopulmonary disease. Electronically Signed   By: Markus Daft M.D.   On: 04/23/2018 12:53   Dg Pelvis Portable  Result Date: 04/26/2018 CLINICAL DATA:  Right hip arthroplasty. EXAM: PORTABLE PELVIS 1-2 VIEWS COMPARISON:  04/26/2018. FINDINGS: Total right hip replacement. Hardware intact. Anatomic alignment. Degenerative changes left hip. No acute bony abnormality. Pelvic calcifications consistent phleboliths. IMPRESSION: Total right hip replacement with anatomic alignment. Electronically Signed   By: Marcello Moores  Register   On: 04/26/2018 12:29   Dg C-arm 1-60 Min  Result Date: 04/26/2018 CLINICAL DATA:  Right hip replacement EXAM: DG C-ARM 61-120 MIN; OPERATIVE RIGHT HIP WITH PELVIS COMPARISON:  None. FLUOROSCOPY TIME:  Fluoroscopy Time:  39 seconds Radiation Exposure Index (if provided by the fluoroscopic device): 2.5 mGy Number of Acquired Spot Images: 4 FINDINGS: Initial images demonstrates severe degenerative changes of the right hip joint. Subsequent right hip replacement is noted. No acute fracture or dislocation is seen. IMPRESSION: Right hip replacement Electronically Signed   By: Inez Catalina M.D.   On: 04/26/2018 11:20   Dg Hip Operative Unilat W Or W/o Pelvis Right  Result Date: 04/26/2018 CLINICAL DATA:  Right hip replacement EXAM: DG C-ARM 61-120 MIN; OPERATIVE RIGHT HIP WITH PELVIS COMPARISON:   None. FLUOROSCOPY TIME:  Fluoroscopy Time:  39 seconds Radiation Exposure Index (if provided by the fluoroscopic device): 2.5 mGy Number of Acquired Spot Images: 4 FINDINGS: Initial images demonstrates severe degenerative changes of the right hip joint. Subsequent right hip replacement is noted. No acute fracture or dislocation is seen. IMPRESSION: Right hip replacement Electronically Signed   By: Inez Catalina M.D.   On: 04/26/2018 11:20    Disposition: Discharge disposition: 01-Home or Self Care         Follow-up Information    Leandrew Koyanagi, MD In 2 weeks.   Specialty:  Orthopedic Surgery Why:  For suture removal, For wound re-check Contact information: [place]  [address] [phone]            Signed: Aundra Dubin 04/27/2018, 7:40 AM

## 2018-04-28 ENCOUNTER — Telehealth (INDEPENDENT_AMBULATORY_CARE_PROVIDER_SITE_OTHER): Payer: Self-pay | Admitting: Orthopaedic Surgery

## 2018-04-28 NOTE — Telephone Encounter (Signed)
[facility] and advised ok

## 2018-04-28 NOTE — Telephone Encounter (Signed)
Megan from [facility] called stating that the RX for the patient's oxycodone is above the MME's and need to have Dr. Erlinda Hong change the directions to 1 every 6 hours.  CB#[phone].  Thank you.

## 2018-04-28 NOTE — Telephone Encounter (Signed)
Yes that's fine 

## 2018-04-28 NOTE — Telephone Encounter (Signed)
Ok to do

## 2018-04-29 ENCOUNTER — Telehealth (INDEPENDENT_AMBULATORY_CARE_PROVIDER_SITE_OTHER): Payer: Self-pay

## 2018-04-29 DIAGNOSIS — Z471 Aftercare following joint replacement surgery: Secondary | ICD-10-CM | POA: Diagnosis not present

## 2018-04-29 NOTE — Telephone Encounter (Signed)
Mendel Ryder spoke to [place] PT from [facility] regarding MEDS and treatment. Patient had SU  05/13/2018. Right THA.    CB [phone]

## 2018-05-05 ENCOUNTER — Other Ambulatory Visit: Payer: Self-pay | Admitting: Internal Medicine

## 2018-05-05 DIAGNOSIS — Z471 Aftercare following joint replacement surgery: Secondary | ICD-10-CM | POA: Diagnosis not present

## 2018-05-07 DIAGNOSIS — Z471 Aftercare following joint replacement surgery: Secondary | ICD-10-CM | POA: Diagnosis not present

## 2018-05-07 MED FILL — POTASSIUM CHLORIDE ER 10 ME: 10 | 30 days supply | Qty: 30 | Fill #0

## 2018-05-11 ENCOUNTER — Encounter (INDEPENDENT_AMBULATORY_CARE_PROVIDER_SITE_OTHER): Payer: Self-pay | Admitting: Orthopaedic Surgery

## 2018-05-11 ENCOUNTER — Ambulatory Visit (INDEPENDENT_AMBULATORY_CARE_PROVIDER_SITE_OTHER): Payer: Medicaid Other | Admitting: Orthopaedic Surgery

## 2018-05-11 DIAGNOSIS — Z96641 Presence of right artificial hip joint: Secondary | ICD-10-CM

## 2018-05-11 MED ORDER — HYDROCODONE-ACETAMINOPHEN 5-325 MG PO TABS: 1.0000 | ORAL_TABLET | Freq: Every evening | ORAL | 0 refills | Status: DC | PRN

## 2018-05-11 NOTE — Progress Notes (Signed)
Post-Op Visit Note   Patient: Joshua [name]           Date of Birth: 11-Feb-1961           MRN: [id number] Visit Date: 05/11/2018 PCP: Ladell Pier, MD   Assessment & Plan:  Chief Complaint:  Chief Complaint  Patient presents with  . Right Hip - Pain   Visit Diagnoses:  1. History of total right hip replacement     Plan: Delta is two-week status post right total hip replacement.  He is overall doing well.  He has noticed a significant improvement in his right hip pain.  He still requires some opiate pain medicines at night to help him sleep but overall he has already completed home health physical therapy.  His surgical incisions healed without any signs of infection.  He has mild swelling.  He ambulates with a cane.  Overall he is doing very well from my standpoint.  Steri-Strips were applied.  Continue aspirin for DVT prophylaxis.  I will see him back in 4 weeks with AP pelvis and lateral right hip  Follow-Up Instructions: Return in about 4 weeks (around 06/08/2018).   Orders:  No orders of the defined types were placed in this encounter.  Meds ordered this encounter  Medications  . HYDROcodone-acetaminophen (NORCO) 5-325 MG tablet    Sig: Take 1-2 tablets by mouth at bedtime as needed.    Dispense:  14 tablet    Refill:  0    Imaging: No results found.  PMFS History: Patient Active Problem List   Diagnosis Date Noted  . History of total hip replacement 04/26/2018  . Mild dilation of ascending aorta (HCC) 12/26/2017  . Dyslipidemia 12/26/2017  . Bilateral carotid bruits 12/26/2017  . Right knee pain 07/23/2017  . Pain in right hip 07/23/2017  . Nocturnal hypoxemia 06/29/2017  . Primary osteoarthritis of both hips 06/29/2017  . Chronic diastolic heart failure ([place]) 04/27/2017  . Primary osteoarthritis of right hip 03/31/2017  . Coronary artery disease involving native coronary artery of native heart without angina pectoris 03/31/2017  . Ischemic  cardiomyopathy 03/31/2017  . Marijuana use 03/31/2017  . Prediabetes 03/31/2017  . Aortic valve insufficiency 12/28/2016  . Mitral valve regurgitation 12/28/2016  . Obesity 12/28/2016  . Aortic valve stenosis, nonrheumatic   . 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 Essential hypertension    Past Medical History:  Diagnosis Date  . CAD (coronary artery disease), native coronary artery 12/26/2016   DES LAD & RCA, EF 25-30%  . Hypertension   . Obesity   . Osteoarthritis   . Pre-diabetes    a. prior A1C 5.6, states he was on meds for this at one point.    Family History  Problem Relation Age of Onset  . CAD Father        MI/CABG age 71    Past Surgical History:  Procedure Laterality Date  . CORONARY STENT INTERVENTION N/A 12/29/2016   Procedure: CORONARY STENT INTERVENTION;  Surgeon: Jettie Booze, MD;  Location: [facility];  Service: Cardiovascular;  Laterality: N/A;  . RIGHT/LEFT HEART CATH AND CORONARY ANGIOGRAPHY N/A 12/29/2016   Procedure: RIGHT/LEFT HEART CATH AND CORONARY ANGIOGRAPHY;  Surgeon: Jettie Booze, MD;  Location: [facility];  Service: Cardiovascular;  Laterality: N/A;  . TOTAL HIP ARTHROPLASTY Right 04/26/2018   Procedure: RIGHT TOTAL HIP ARTHROPLASTY ANTERIOR APPROACH;  Surgeon: Leandrew Koyanagi, MD;  Location: [facility];  Service: Orthopedics;  Laterality: Right;   Social History   Occupational History  . Not on file  Tobacco Use  . Smoking status: Former Research scientist (life sciences)  . Smokeless tobacco: Never Used  . Tobacco comment: Smoked lightly for approx 20 years, quit 1990s  Substance and Sexual Activity  . Alcohol use: Yes    Comment: 1-2 mixed drinks per week  . Drug use: Yes    Types: Marijuana  . Sexual activity: Not on file

## 2018-05-21 ENCOUNTER — Other Ambulatory Visit: Payer: Self-pay | Admitting: Internal Medicine

## 2018-05-21 DIAGNOSIS — M160 Bilateral primary osteoarthritis of hip: Secondary | ICD-10-CM

## 2018-05-21 MED FILL — LIDOCAINE PATCH 5%: 5 | 30 days supply | Qty: 30 | Fill #0

## 2018-05-21 MED FILL — CARVEDILOL 3.125 MG TABLET: 3.125 | 30 days supply | Qty: 60 | Fill #4

## 2018-05-21 MED FILL — LISINOPRIL 10 MG TABS: 10 | 30 days supply | Qty: 60 | Fill #1

## 2018-06-08 ENCOUNTER — Ambulatory Visit (INDEPENDENT_AMBULATORY_CARE_PROVIDER_SITE_OTHER): Payer: Medicaid Other | Admitting: Orthopaedic Surgery

## 2018-06-08 ENCOUNTER — Ambulatory Visit (INDEPENDENT_AMBULATORY_CARE_PROVIDER_SITE_OTHER): Payer: Medicaid Other

## 2018-06-08 ENCOUNTER — Encounter (INDEPENDENT_AMBULATORY_CARE_PROVIDER_SITE_OTHER): Payer: Self-pay | Admitting: Orthopaedic Surgery

## 2018-06-08 VITALS — Ht 73.0 in | Wt 199.0 lb

## 2018-06-08 DIAGNOSIS — M25512 Pain in left shoulder: Secondary | ICD-10-CM

## 2018-06-08 DIAGNOSIS — G8929 Other chronic pain: Secondary | ICD-10-CM

## 2018-06-08 DIAGNOSIS — Z96641 Presence of right artificial hip joint: Secondary | ICD-10-CM

## 2018-06-08 DIAGNOSIS — M1612 Unilateral primary osteoarthritis, left hip: Secondary | ICD-10-CM | POA: Diagnosis not present

## 2018-06-08 NOTE — Progress Notes (Signed)
Office Visit Note   Patient: Joshua [name]           Date of Birth: 08/19/1960           MRN: [id number] Visit Date: 06/08/2018              Requested by: Ladell Pier, MD [address] [place], [place] [address] PCP: Ladell Pier, MD   Assessment & Plan: Visit Diagnoses:  1. History of total right hip replacement   2. Chronic left shoulder pain   3. Unilateral primary osteoarthritis, left hip     Plan: Impression is status post right hip replacement, #2 left hip end-stage of joint disease, #3 left shoulder AC arthropathy.  In regards to the right hip, he will continue to work on his home exercise program.  Follow-up with [place] in 6 weeks time for recheck.  In regards to the left hip, I did discuss cortisone injection, but he would like to hold off on this for now.  We discussed the eventual need for total hip replacement and the need to wait greater than 6 weeks following injection should he want to proceed with that.  He will let us know.  In regards to left shoulder, he does not feel as though his pain is bad enough for injection today.  He will follow-up with [place] as needed for that.  Follow-Up Instructions: Return in about 6 weeks (around 07/20/2018).   Orders:  Orders Placed This Encounter  Procedures  . XR HIP UNILAT W OR W/O PELVIS 2-3 VIEWS RIGHT  . XR Shoulder Left   No orders of the defined types were placed in this encounter.     Procedures: No procedures performed   Clinical Data: No additional findings.   Subjective: Chief Complaint  Patient presents with  . Right Hip - Routine Post Op    04/26/2018 right total hip replacement   . Left Shoulder - Pain    HPI patient is a pleasant 58 year old gentleman who presents our clinic today 6 weeks status post right total hip replacement.  Doing excellent.  No complaints.  He is now starting to get increased pain to the left hip.  History of end-stage degenerative joint disease there.  No previous  cortisone injection.  Other issue he brings up today is his left shoulder.  Several years of pain to the left shoulder without any specific injury.  It has recently worsened.  The pain he has is worse when he sleeping on the left side.  He does get relief when using a heating pad.  No previous cortisone injection or surgical intervention.  Review of Systems as detailed in HPI.  All others reviewed and are negative.   Objective: Vital Signs: Ht 6\' 1"  (1.854 m)   Wt 199 lb (90.3 kg)   BMI 26.25 kg/m   Physical Exam well-developed well-nourished gentleman in no acute distress.  Alert and oriented x3.  Ortho Exam examination of the right hip reveals well-healed surgical incision no evidence of infection.  Great range of motion and strength.  Examination of left hip reveals markedly positive logroll.  He is neurovascular intact distally.  Examination of left shoulder feels full active range of motion all planes.  Negative empty can and cross body adduction.  He does have moderate tenderness over the [facility] joint.  Specialty Comments:  No specialty comments available.  Imaging: Xr Hip Unilat W Or W/o Pelvis 2-3 Views Right  Result Date: 06/08/2018 X-rays of  the right hip reveal a well-seated prosthesis without evidence of subsidence or ostial lysis.  X-rays of the left hip demonstrate severe degenerative changes  Xr Shoulder Left  Result Date: 06/08/2018 X-rays of the left shoulder reveal marked degenerative changes AC joint.  No severe migration humeral head.    PMFS History: Patient Active Problem List   Diagnosis Date Noted  . Chronic left shoulder pain 06/08/2018  . Unilateral primary osteoarthritis, left hip 06/08/2018  . History of total hip replacement 04/26/2018  . Mild dilation of ascending aorta (HCC) 12/26/2017  . Dyslipidemia 12/26/2017  . Bilateral carotid bruits 12/26/2017  . Right knee pain 07/23/2017  . Pain in right hip 07/23/2017  . Nocturnal hypoxemia 06/29/2017  .  Primary osteoarthritis of both hips 06/29/2017  . Chronic diastolic heart failure ([place]) 04/27/2017  . Primary osteoarthritis of right hip 03/31/2017  . Coronary artery disease involving native coronary artery of native heart without angina pectoris 03/31/2017  . Ischemic cardiomyopathy 03/31/2017  . Marijuana use 03/31/2017  . Prediabetes 03/31/2017  . Aortic valve insufficiency 12/28/2016  . Mitral valve regurgitation 12/28/2016  . Obesity 12/28/2016  . Aortic valve stenosis, nonrheumatic   . 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 Essential hypertension    Past Medical History:  Diagnosis Date  . CAD (coronary artery disease), native coronary artery 12/26/2016   DES LAD & RCA, EF 25-30%  . Hypertension   . Obesity   . Osteoarthritis   . Pre-diabetes    a. prior A1C 5.6, states he was on meds for this at one point.    Family History  Problem Relation Age of Onset  . CAD Father        MI/CABG age 3    Past Surgical History:  Procedure Laterality Date  . CORONARY STENT INTERVENTION N/A 12/29/2016   Procedure: CORONARY STENT INTERVENTION;  Surgeon: Jettie Booze, MD;  Location: [facility];  Service: Cardiovascular;  Laterality: N/A;  . RIGHT/LEFT HEART CATH AND CORONARY ANGIOGRAPHY N/A 12/29/2016   Procedure: RIGHT/LEFT HEART CATH AND CORONARY ANGIOGRAPHY;  Surgeon: Jettie Booze, MD;  Location: [facility];  Service: Cardiovascular;  Laterality: N/A;  . TOTAL HIP ARTHROPLASTY Right 04/26/2018   Procedure: RIGHT TOTAL HIP ARTHROPLASTY ANTERIOR APPROACH;  Surgeon: Leandrew Koyanagi, MD;  Location: [facility];  Service: Orthopedics;  Laterality: Right;   Social History   Occupational History  . Not on file  Tobacco Use  . Smoking status: Former Research scientist (life sciences)  . Smokeless tobacco: Never Used  . Tobacco comment: Smoked lightly for approx 20 years, quit 1990s  Substance and Sexual Activity  . Alcohol use: Yes    Comment:  1-2 mixed drinks per week  . Drug use: Yes    Types: Marijuana  . Sexual activity: Not on file

## 2018-06-21 MED FILL — LIDOCAINE PATCH 5%: 5 | 30 days supply | Qty: 30 | Fill #1

## 2018-06-21 MED FILL — CARVEDILOL 3.125 MG TABLET: 3.125 | 30 days supply | Qty: 60 | Fill #5

## 2018-06-21 MED FILL — POTASSIUM CHLORIDE ER 10 ME: 10 | 30 days supply | Qty: 30 | Fill #1

## 2018-06-21 MED FILL — LISINOPRIL 10 MG TABS: 10 | 30 days supply | Qty: 60 | Fill #2

## 2018-06-21 MED FILL — PROMETHAZINE 25 MG TABLET: 25 | 7 days supply | Qty: 30 | Fill #1

## 2018-07-22 ENCOUNTER — Other Ambulatory Visit: Payer: Self-pay | Admitting: Internal Medicine

## 2018-07-22 DIAGNOSIS — I10 Essential (primary) hypertension: Secondary | ICD-10-CM

## 2018-07-22 MED FILL — LIDOCAINE PATCH 5%: 5 | 30 days supply | Qty: 30 | Fill #2

## 2018-07-22 MED FILL — LISINOPRIL 10 MG TABS: 10 | 30 days supply | Qty: 60 | Fill #0

## 2018-07-22 MED FILL — CARVEDILOL 3.125 MG TABLET: 3.125 | 30 days supply | Qty: 60 | Fill #6

## 2018-07-27 ENCOUNTER — Ambulatory Visit (INDEPENDENT_AMBULATORY_CARE_PROVIDER_SITE_OTHER): Payer: Medicaid Other | Admitting: Orthopaedic Surgery

## 2018-07-28 ENCOUNTER — Other Ambulatory Visit: Payer: Self-pay

## 2018-07-28 ENCOUNTER — Ambulatory Visit (INDEPENDENT_AMBULATORY_CARE_PROVIDER_SITE_OTHER): Payer: Medicaid Other | Admitting: Orthopaedic Surgery

## 2018-07-28 ENCOUNTER — Encounter (INDEPENDENT_AMBULATORY_CARE_PROVIDER_SITE_OTHER): Payer: Self-pay | Admitting: Orthopaedic Surgery

## 2018-07-28 ENCOUNTER — Ambulatory Visit (INDEPENDENT_AMBULATORY_CARE_PROVIDER_SITE_OTHER): Payer: Medicaid Other

## 2018-07-28 DIAGNOSIS — M25512 Pain in left shoulder: Secondary | ICD-10-CM

## 2018-07-28 DIAGNOSIS — G8929 Other chronic pain: Secondary | ICD-10-CM | POA: Diagnosis not present

## 2018-07-28 DIAGNOSIS — Z96641 Presence of right artificial hip joint: Secondary | ICD-10-CM

## 2018-07-28 DIAGNOSIS — M1612 Unilateral primary osteoarthritis, left hip: Secondary | ICD-10-CM | POA: Diagnosis not present

## 2018-07-28 MED ORDER — METHYLPREDNISOLONE 4 MG PO TBPK: ORAL_TABLET | ORAL | 0 refills | Status: DC

## 2018-07-28 MED FILL — METHYLPREDNISOLONE 4 MG TBP: 4 | 6 days supply | Qty: 21 | Fill #0

## 2018-07-28 NOTE — Progress Notes (Signed)
Office Visit Note   Patient: Joshua [name]           Date of Birth: Jun 15, 1960           MRN: [id number] Visit Date: 07/28/2018              Requested by: Ladell Pier, MD [address] [place], [place] [address] PCP: Ladell Pier, MD   Assessment & Plan: Visit Diagnoses:  1. History of total right hip replacement   2. Unilateral primary osteoarthritis, left hip   3. Chronic left shoulder pain     Plan: Impression is status post right total hip replacement.  #2 left hip primary localized osteoarthritis.  #3 left shoulder pain.  In regards to the right hip, he is doing excellent.  Continue with strengthening exercises.  In regards to the left shoulder, I have again offered a cortisone injection.  He has politely declined.  In regards to the left hip/lower back, he has politely declined a cortisone injection.  We will call in a steroid taper in hopes of helping with some of his pain.  He will follow-up with [place] in 3 months time for his right total hip arthroplasty.  At that point, he would like to discuss proceeding with left total hip arthroplasty.  Follow-Up Instructions: Return in about 3 months (around 10/27/2018).   Orders:  Orders Placed This Encounter  Procedures   XR HIP UNILAT W OR W/O PELVIS 2-3 VIEWS RIGHT   No orders of the defined types were placed in this encounter.     Procedures: No procedures performed   Clinical Data: No additional findings.   Subjective: Chief Complaint  Patient presents with   Right Hip - Follow-up   Left Shoulder - Pain    HPI patient is a pleasant 58 year old gentleman who presents our clinic today 3 months status post right total hip replacement.  Doing very well.  No complaints there.  He has been having more more left hip and lower back pain over the past several weeks.  He is also had continued left shoulder pain which is not improved over the past few weeks.  He was seen in our office approximately 6 weeks  ago for both these.  He politely declined cortisone injections to both left hip and left shoulder.  Review of Systems as detailed in HPI.  All others reviewed and are negative.   Objective: Vital Signs: There were no vitals taken for this visit.  Physical Exam well-developed and well-nourished gentleman in no acute distress.  Alert and oriented x3.  Ortho Exam examination of his right hip reveals a fully healed surgical scar.  Full range of motion and strength.  Examination of the left hip reveals a positive logroll.  Negative straight leg raise.  Neurovascular intact distally.  Left shoulder exam shows full active range of motion all planes.  Minimally positive empty can.  Moderate tenderness over the [facility] joint.  4 out of 5 strength throughout.  He is neurovascular intact distally.  Specialty Comments:  No specialty comments available.  Imaging: Xr Hip Unilat W Or W/o Pelvis 2-3 Views Right  Result Date: 07/28/2018 X-rays demonstrate a well-seated prosthesis without complication    PMFS History: Patient Active Problem List   Diagnosis Date Noted   Chronic left shoulder pain 06/08/2018   Unilateral primary osteoarthritis, left hip 06/08/2018   History of total hip replacement 04/26/2018   Mild dilation of ascending aorta ([place]) 12/26/2017   Dyslipidemia  12/26/2017   Bilateral carotid bruits 12/26/2017   Right knee pain 07/23/2017   Pain in right hip 07/23/2017   Nocturnal hypoxemia 06/29/2017   Primary osteoarthritis of both hips 06/29/2017   Chronic diastolic heart failure ([place]) 04/27/2017   Primary osteoarthritis of right hip 03/31/2017   Coronary artery disease involving native coronary artery of native heart without angina pectoris 03/31/2017   Ischemic cardiomyopathy 03/31/2017   Marijuana use 03/31/2017   Prediabetes 03/31/2017   Aortic valve insufficiency 12/28/2016   Mitral valve regurgitation 12/28/2016   Obesity 12/28/2016   Aortic valve  stenosis, nonrheumatic    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 prior A1C 5.6, states he was on meds for this at one point.    Family History  Problem Relation Age of Onset   CAD Father        MI/CABG age 40    Past Surgical History:  Procedure Laterality Date   CORONARY STENT INTERVENTION N/A 12/29/2016   Procedure: CORONARY STENT INTERVENTION;  Surgeon: Jettie Booze, MD;  Location: [place] [facility];  Service: Cardiovascular;  Laterality: N/A;   RIGHT/LEFT HEART CATH AND CORONARY ANGIOGRAPHY N/A 12/29/2016   Procedure: RIGHT/LEFT HEART CATH AND CORONARY ANGIOGRAPHY;  Surgeon: Jettie Booze, MD;  Location: [place] [facility];  Service: Cardiovascular;  Laterality: N/A;   TOTAL HIP ARTHROPLASTY Right 04/26/2018   Procedure: RIGHT TOTAL HIP ARTHROPLASTY ANTERIOR APPROACH;  Surgeon: Leandrew Koyanagi, MD;  Location: [place];  Service: Orthopedics;  Laterality: Right;   Social History   Occupational History   Not on file  Tobacco Use   Smoking status: Former Smoker   Smokeless tobacco: Never Used   Tobacco comment: Smoked lightly for approx 20 years, quit 1990s  Substance and Sexual Activity   Alcohol use: Yes    Comment: 1-2 mixed drinks per week   Drug use: Yes    Types: Marijuana   Sexual activity: Not on file

## 2018-08-23 ENCOUNTER — Other Ambulatory Visit: Payer: Self-pay | Admitting: Internal Medicine

## 2018-08-23 DIAGNOSIS — I10 Essential (primary) hypertension: Secondary | ICD-10-CM

## 2018-08-23 DIAGNOSIS — M160 Bilateral primary osteoarthritis of hip: Secondary | ICD-10-CM

## 2018-08-23 MED FILL — CARVEDILOL 3.125 MG TABLET: 3.125 | 90 days supply | Qty: 180 | Fill #7

## 2018-08-23 MED FILL — POTASSIUM CHLORIDE ER 10 ME: 10 | 30 days supply | Qty: 30 | Fill #2

## 2018-08-24 ENCOUNTER — Other Ambulatory Visit: Payer: Self-pay

## 2018-08-24 ENCOUNTER — Ambulatory Visit: Payer: Medicaid Other | Attending: Primary Care | Admitting: Primary Care

## 2018-08-24 ENCOUNTER — Encounter: Payer: Self-pay | Admitting: Primary Care

## 2018-08-24 DIAGNOSIS — G8929 Other chronic pain: Secondary | ICD-10-CM

## 2018-08-24 DIAGNOSIS — M25551 Pain in right hip: Secondary | ICD-10-CM

## 2018-08-24 DIAGNOSIS — I10 Essential (primary) hypertension: Secondary | ICD-10-CM

## 2018-08-24 DIAGNOSIS — M25512 Pain in left shoulder: Secondary | ICD-10-CM | POA: Diagnosis not present

## 2018-08-24 NOTE — Progress Notes (Signed)
Takes blood pressure at home.  125/80 before medications  SBP - 120-135  Left should pain, request gel to put on the shoulder

## 2018-08-25 ENCOUNTER — Other Ambulatory Visit: Payer: Self-pay | Admitting: Primary Care

## 2018-08-25 ENCOUNTER — Other Ambulatory Visit: Payer: Self-pay | Admitting: Internal Medicine

## 2018-08-25 DIAGNOSIS — I10 Essential (primary) hypertension: Secondary | ICD-10-CM

## 2018-08-25 MED ORDER — LISINOPRIL 10 MG PO TABS: 10.0000 mg | ORAL_TABLET | Freq: Two times a day (BID) | ORAL | 0 refills | Status: DC

## 2018-08-25 MED ORDER — CARVEDILOL 3.125 MG PO TABS: 3.1250 mg | ORAL_TABLET | Freq: Two times a day (BID) | ORAL | 3 refills | Status: DC

## 2018-08-25 MED FILL — LISINOPRIL 10 MG TABS: 10 | 30 days supply | Qty: 60 | Fill #0

## 2018-08-25 NOTE — Progress Notes (Signed)
Virtual Visit via Telephone Note  I connected with Joshua [name] on 08/25/18 at  1:30 PM EDT by  WEb EX and verified that I am speaking with the correct person using two identifiers.   I discussed the limitations, risks, security and privacy concerns of performing an evaluation and management service by telephone and the availability of in person appointments. I also discussed with the patient that there may be a patient responsible charge related to this service. The patient expressed understanding and agreed to proceed.   History of Present Illness:  Joshua [name] requested to have visit Web ex instead of tele. He wanted refills from medication that are being prescribed by cardiology and orthopedist. After 15 mins of trying to tell him any changes he wants to be made needs to be done through provider. Than he proceeds to show me a medicine bottle with his PCP name on it. Than again I tried to tell him that was a courtesy to him. Especially after her shared the cardiologist wanted him to take lisinopril in the AM instead 10mg  BID. He than begin to c/o about his increase pain in his left hip and lower back.  He is followed by Dr. Erlinda Hong orthopedist advised to call and make a f/u appt if he cont's to have increase pain.    Observations/Objective: Review of Systems  Constitutional: Negative.   HENT: Negative.   Respiratory: Negative.   Cardiovascular: Negative.   Gastrointestinal: Negative.   Genitourinary: Negative.   Musculoskeletal: Positive for back pain.       Back pain  Skin: Negative.   Neurological: Negative.   Psychiatric/Behavioral: Negative.     Assessment and Plan: Joshua [name] was seen today for medication refill.  Diagnoses and all orders for this visit:  Essential hypertension -     carvedilol (COREG) 3.125 MG tablet; Take 1 tablet (3.125 mg total) by mouth 2 (two) times daily. -     lisinopril (ZESTRIL) 10 MG tablet; Take 1 tablet (10 mg total) by mouth 2 (two) times daily.    Chronic pain  secondary to osteoarthritis   Chronic left shoulder pain Followed by Dr. Erlinda Hong decline cortisone injection tx with steroid taper   Essential hypertension -     carvedilol (COREG) 3.125 MG tablet; Take 1 tablet (3.125 mg total) by mouth 2 (two) times daily. -     lisinopril (ZESTRIL) 10 MG tablet; Take 1 tablet (10 mg total) by mouth 2 (two) times daily. Needs appt for more refills. Followed by cardiology Dr. Sallyanne Kuster  Pain in right hip Dr Erlinda Hong on 07/28/2018 status post right total hip replacement.2 left hip primary localized osteoarthritis Chronic states bone on bone needs hip replacement followed by ortho.   Follow Up Instructions:    I discussed the assessment and treatment plan with the patient. The patient was provided an opportunity to ask questions and all were answered. The patient agreed with the plan and demonstrated an understanding of the instructions.   The patient was advised to call back or seek an in-person evaluation if the symptoms worsen or if the condition fails to improve as anticipated.  I provided 45 minutes face-to-face time during this encounter.   Kerin Perna, NP

## 2018-08-28 ENCOUNTER — Other Ambulatory Visit: Payer: Self-pay | Admitting: Internal Medicine

## 2018-08-28 DIAGNOSIS — M160 Bilateral primary osteoarthritis of hip: Secondary | ICD-10-CM

## 2018-08-30 MED FILL — LIDOCAINE PATCH 5%: 5 | 30 days supply | Qty: 30 | Fill #0

## 2018-10-01 ENCOUNTER — Other Ambulatory Visit: Payer: Self-pay | Admitting: Internal Medicine

## 2018-10-01 MED FILL — LIDOCAINE PATCH 5%: 5 | 30 days supply | Qty: 30 | Fill #1

## 2018-10-01 MED FILL — LISINOPRIL 10 MG TABS: 10 | 30 days supply | Qty: 60 | Fill #0

## 2018-10-14 NOTE — Telephone Encounter (Signed)
Pt had visit with you 08/24/18, please refill if appropriate.

## 2018-10-19 MED FILL — POTASSIUM CHLORIDE ER 10 ME: 10 | 30 days supply | Qty: 30 | Fill #0

## 2018-10-19 NOTE — Telephone Encounter (Signed)
Please refill if appropriate

## 2018-10-21 ENCOUNTER — Telehealth: Payer: Self-pay | Admitting: Internal Medicine

## 2018-10-21 NOTE — Telephone Encounter (Signed)
-----   Message from Ladell Pier, MD sent at 10/20/2018  9:54 AM EDT ----- Regarding: give pt a routine f/u appt with me in August.

## 2018-10-21 NOTE — Telephone Encounter (Signed)
Attempted to reach patient no answer °

## 2018-10-28 ENCOUNTER — Ambulatory Visit (INDEPENDENT_AMBULATORY_CARE_PROVIDER_SITE_OTHER): Payer: Medicaid Other

## 2018-10-28 ENCOUNTER — Telehealth: Payer: Self-pay | Admitting: Internal Medicine

## 2018-10-28 ENCOUNTER — Encounter: Payer: Self-pay | Admitting: Orthopaedic Surgery

## 2018-10-28 ENCOUNTER — Other Ambulatory Visit: Payer: Self-pay | Admitting: Primary Care

## 2018-10-28 ENCOUNTER — Ambulatory Visit (INDEPENDENT_AMBULATORY_CARE_PROVIDER_SITE_OTHER): Payer: Medicaid Other | Admitting: Orthopaedic Surgery

## 2018-10-28 ENCOUNTER — Other Ambulatory Visit: Payer: Self-pay | Admitting: Pharmacist

## 2018-10-28 ENCOUNTER — Other Ambulatory Visit: Payer: Self-pay

## 2018-10-28 DIAGNOSIS — Z96641 Presence of right artificial hip joint: Secondary | ICD-10-CM

## 2018-10-28 DIAGNOSIS — I10 Essential (primary) hypertension: Secondary | ICD-10-CM

## 2018-10-28 DIAGNOSIS — M1612 Unilateral primary osteoarthritis, left hip: Secondary | ICD-10-CM

## 2018-10-28 DIAGNOSIS — H40033 Anatomical narrow angle, bilateral: Secondary | ICD-10-CM | POA: Diagnosis not present

## 2018-10-28 DIAGNOSIS — H16223 Keratoconjunctivitis sicca, not specified as Sjogren's, bilateral: Secondary | ICD-10-CM | POA: Diagnosis not present

## 2018-10-28 MED ORDER — PREDNISONE 10 MG (21) PO TBPK: ORAL_TABLET | ORAL | 0 refills | Status: DC

## 2018-10-28 MED ORDER — LISINOPRIL 10 MG PO TABS: 10.0000 mg | ORAL_TABLET | Freq: Two times a day (BID) | ORAL | 0 refills | Status: DC

## 2018-10-28 MED ORDER — METHOCARBAMOL 500 MG PO TABS: 500.0000 mg | ORAL_TABLET | Freq: Two times a day (BID) | ORAL | 0 refills | Status: DC | PRN

## 2018-10-28 MED FILL — POTASSIUM CHLORIDE ER 10 ME: 10 | 30 days supply | Qty: 30 | Fill #0

## 2018-10-28 MED FILL — LISINOPRIL 10 MG TABS: 10 | 30 days supply | Qty: 60 | Fill #0

## 2018-10-28 MED FILL — METHOCARBAMOL 500 MG TABS: 500 | 10 days supply | Qty: 20 | Fill #0

## 2018-10-28 MED FILL — predniSONE 10 MG TABS: 10 | 6 days supply | Qty: 21 | Fill #0

## 2018-10-28 NOTE — Progress Notes (Signed)
Office Visit Note   Patient: Joshua [name]           Date of Birth: 03-Sep-1960           MRN: [id number] Visit Date: 10/28/2018              Requested by: Ladell Pier, MD [address] [place],  [place] [address] PCP: Ladell Pier, MD   Assessment & Plan: Visit Diagnoses:  1. Unilateral primary osteoarthritis, left hip   2. Status post total hip replacement, right   3. History of right hip replacement     Plan: Impression is status post right total hip replacement doing well.  #2 end-stage degenerative joint disease left hip.  In regards to the right hip, he will follow-up with [place] in 6 months time for repeat evaluation and x-rays.  In regards to the left hip, he has failed oral and injectable anti-inflammatories and would like to proceed with definitive treatment to include a left anterior total hip replacement.  Risks, benefits possible occasions reviewed.  Rehab and recovery time discussed.  All questions were answered.  He will likely be discharged home with home health following hip replacement surgery.  Follow-Up Instructions: Return for 2 wees post-op left THA and 6 months for right THA.   Orders:  Orders Placed This Encounter  Procedures  . XR HIP UNILAT W OR W/O PELVIS 1V RIGHT   Meds ordered this encounter  Medications  . predniSONE (STERAPRED UNI-PAK 21 TAB) 10 MG (21) TBPK tablet    Sig: Take as directed    Dispense:  21 tablet    Refill:  0  . methocarbamol (ROBAXIN) 500 MG tablet    Sig: Take 1 tablet (500 mg total) by mouth 2 (two) times daily as needed for muscle spasms.    Dispense:  20 tablet    Refill:  0      Procedures: No procedures performed   Clinical Data: No additional findings.   Subjective: Chief Complaint  Patient presents with  . Right Hip - Follow-up  . Left Hip - Pain    HPI patient is a pleasant 58 year old gentleman who presents our clinic today 6 months status post right anterior total hip replacement.  He  has been doing excellent.  No complaints.  He does have a history of end-stage generative joint disease of the left hip.  He has tried oral and injectable anti-inflammatories with out significant relief of symptoms.  He is having pain with ADLs as well as at night.  He is now walking with an altered gait which is causing left lower back pain.  At this point, he is ready to proceed with definitive treatment of the left anterior total hip replacement.  Review of Systems as detailed in HPI.  All others reviewed and are negative.   Objective: Vital Signs: There were no vitals taken for this visit.  Physical Exam well-developed and well-nourished gentleman in no acute distress.  Alert and oriented x3.  Ortho Exam stable exam of the right hip.  Left hip shows a markedly positive logroll with limited internal rotation.  Mildly positive straight leg raise.  No focal weakness.  He is neurovascular intact distally.  Specialty Comments:  No specialty comments available.  Imaging: Xr Hip Unilat W Or W/o Pelvis 1v Right  Result Date: 10/28/2018 X-rays of the right hip demonstrate a well-seated prosthesis without complication.  Left hip shows marked degenerative changes     PMFS History:  Patient Active Problem List   Diagnosis Date Noted  . Chronic left shoulder pain 06/08/2018  . Unilateral primary osteoarthritis, left hip 06/08/2018  . Status post total hip replacement, right 04/26/2018  . Mild dilation of ascending aorta ([facility]) 12/26/2017  . Dyslipidemia 12/26/2017  . Bilateral carotid bruits 12/26/2017  . Right knee pain 07/23/2017  . Pain in right hip 07/23/2017  . Nocturnal hypoxemia 06/29/2017  . Primary osteoarthritis of both hips 06/29/2017  . Chronic diastolic heart failure ([place]) 04/27/2017  . Primary osteoarthritis of right hip 03/31/2017  . Coronary artery disease involving native coronary artery of native heart without angina pectoris 03/31/2017  . Ischemic cardiomyopathy  03/31/2017  . Marijuana use 03/31/2017  . Prediabetes 03/31/2017  . Aortic valve insufficiency 12/28/2016  . Mitral valve regurgitation 12/28/2016  . Obesity 12/28/2016  . Aortic valve stenosis, nonrheumatic   . 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 Essential hypertension    Past Medical History:  Diagnosis Date  . CAD (coronary artery disease), native coronary artery 12/26/2016   DES LAD & RCA, EF 25-30%  . Hypertension   . Obesity   . Osteoarthritis   . Pre-diabetes    a. prior A1C 5.6, states he was on meds for this at one point.    Family History  Problem Relation Age of Onset  . CAD Father        MI/CABG age 46    Past Surgical History:  Procedure Laterality Date  . CORONARY STENT INTERVENTION N/A 12/29/2016   Procedure: CORONARY STENT INTERVENTION;  Surgeon: Jettie Booze, MD;  Location: [place] [facility];  Service: Cardiovascular;  Laterality: N/A;  . RIGHT/LEFT HEART CATH AND CORONARY ANGIOGRAPHY N/A 12/29/2016   Procedure: RIGHT/LEFT HEART CATH AND CORONARY ANGIOGRAPHY;  Surgeon: Jettie Booze, MD;  Location: [place] [facility];  Service: Cardiovascular;  Laterality: N/A;  . TOTAL HIP ARTHROPLASTY Right 04/26/2018   Procedure: RIGHT TOTAL HIP ARTHROPLASTY ANTERIOR APPROACH;  Surgeon: Leandrew Koyanagi, MD;  Location: [place];  Service: Orthopedics;  Laterality: Right;   Social History   Occupational History  . Not on file  Tobacco Use  . Smoking status: Former Research scientist (life sciences)  . Smokeless tobacco: Never Used  . Tobacco comment: Smoked lightly for approx 20 years, quit 1990s  Substance and Sexual Activity  . Alcohol use: Yes    Comment: 1-2 mixed drinks per week  . Drug use: Yes    Types: Marijuana  . Sexual activity: Not on file

## 2018-10-28 NOTE — Telephone Encounter (Signed)
Pt states he thinks he needs blood work stating he has not had it in a long while. Please call pt [phone].

## 2018-10-28 NOTE — Telephone Encounter (Signed)
Will forward to pcp

## 2018-10-29 NOTE — Telephone Encounter (Signed)
Please schedule pt an appointment  

## 2018-11-01 NOTE — Telephone Encounter (Signed)
Pt called to say BP was 103/68 pt states his BP is good he is not concerned. Pt states having surgery with Dr. Erlinda Hong on 11/15/18.  Pt fine with telephone visit for 8/28 visit with Wynetta Emery and come later if provider orders labs.

## 2018-11-10 NOTE — Pre-Procedure Instructions (Signed)
[facility]  11/10/2018      [place], [place] [address] [place] [address] Phone: [phone] Fax: [phone]    Your procedure is scheduled on Mon., Aug. 3, 2020 from 10:13AM-12:26PM  Report to [facility] Entrance "A" at 8:10AM  Call this number if you have problems the morning of surgery:  [phone]   Remember:  Do not eat after midnight on Aug. 2nd  You may drink clear liquids until 3 hours (7:10AM) prior to surgery time .  Clear liquids allowed are:  Water, Juice (non-citric and without pulp), Carbonated beverages, Clear Tea, Black Coffee only, Plain Jell-O only, Gatorade and Plain Popsicles only   Please complete your PRE-SURGERY ENSURE that was provided to you by (7:10AM) the morning of surgery.  Please, if able, drink it in one setting. DO NOT SIP.     Take these medicines the morning of surgery with A SIP OF WATER: Carvedilol (COREG)  If Needed:  Methocarbamol (ROBAXIN)   Follow your surgeon's instructions on when to stop Aspirin.  If no instructions were given by your surgeon then you will need to call the office to get those instructions.     As of today, stop taking all Other Aspirin containing products, Vitamins, Fish oils, and Herbal medications. Also stop all NSAIDS i.e. Advil, Ibuprofen, Motrin, Aleve, Anaprox, Naproxen, BC, Goody Powders, and all Supplements.   Special instructions:  [facility]- Preparing For Surgery  Before surgery, you can play an important role. Because skin is not sterile, your skin needs to be as free of germs as possible. You can reduce the number of germs on your skin by washing with CHG (chlorahexidine gluconate) Soap before surgery.  CHG is an antiseptic cleaner which kills germs and bonds with the skin to continue killing germs even after washing.    Please do not use if you have an allergy to CHG or antibacterial soaps. If your skin becomes  reddened/irritated stop using the CHG.  Do not shave (including legs and underarms) for at least 48 hours prior to first CHG shower. It is OK to shave your face.  Please follow these instructions carefully.   1. Shower the NIGHT BEFORE SURGERY and the MORNING OF SURGERY with CHG.   2. If you chose to wash your hair, wash your hair first as usual with your normal shampoo.  3. After you shampoo, rinse your hair and body thoroughly to remove the shampoo.  4. Use CHG as you would any other liquid soap. You can apply CHG directly to the skin and wash gently with a scrungie or a clean washcloth.   5. Apply the CHG Soap to your body ONLY FROM THE NECK DOWN.  Do not use on open wounds or open sores. Avoid contact with your eyes, ears, mouth and genitals (private parts). Wash Face and genitals (private parts)  with your normal soap.  6. Wash thoroughly, paying special attention to the area where your surgery will be performed.  7. Thoroughly rinse your body with warm water from the neck down.  8. DO NOT shower/wash with your normal soap after using and rinsing off the CHG Soap.  9. Pat yourself dry with a CLEAN TOWEL.  10. Wear CLEAN PAJAMAS to bed the night before surgery, wear comfortable clothes the morning of surgery  11. Place CLEAN SHEETS on your bed the night of your first shower and DO NOT SLEEP  WITH PETS.   Day of Surgery:             Remember to brush your teeth WITH YOUR REGULAR TOOTHPASTE.   Do not wear jewelry.  Do not wear lotions, powders, colognes, or deodorant.  Do not shave 48 hours prior to surgery.  Men may shave face.  Do not bring valuables to the hospital.  [facility] is not responsible for any belongings or valuables.  Contacts, dentures or bridgework may not be worn into surgery.    For patients admitted to the hospital, discharge time will be determined by your treatment team.  Patients discharged the day of surgery will not be allowed to drive home.    Please wear clean clothes to the hospital/surgery center.     Please read over the following fact sheets that you were given. Pain Booklet, Coughing and Deep Breathing, MRSA Information and Surgical Site Infection Prevention

## 2018-11-11 ENCOUNTER — Inpatient Hospital Stay (HOSPITAL_COMMUNITY)
Admission: RE | Admit: 2018-11-11 | Discharge: 2018-11-11 | Disposition: A | Discharge status: Home / Self Care | Payer: Medicaid Other | Source: Ambulatory Visit

## 2018-11-11 ENCOUNTER — Other Ambulatory Visit (HOSPITAL_COMMUNITY)
Admission: RE | Admit: 2018-11-11 | Discharge: 2018-11-11 | Disposition: A | Discharge status: Home / Self Care | Payer: Medicaid Other | Source: Ambulatory Visit | Attending: Orthopaedic Surgery | Admitting: Orthopaedic Surgery

## 2018-11-11 DIAGNOSIS — Z20828 Contact with and (suspected) exposure to other viral communicable diseases: Secondary | ICD-10-CM | POA: Diagnosis not present

## 2018-11-11 LAB — SARS CORONAVIRUS 2 (TAT 6-24 HRS): SARS Coronavirus 2: NEGATIVE

## 2018-11-12 ENCOUNTER — Other Ambulatory Visit: Payer: Self-pay

## 2018-11-12 ENCOUNTER — Encounter (HOSPITAL_COMMUNITY): Payer: Self-pay

## 2018-11-12 ENCOUNTER — Encounter (HOSPITAL_COMMUNITY)
Admission: RE | Admit: 2018-11-12 | Discharge: 2018-11-12 | Disposition: A | Discharge status: Home / Self Care | Payer: Medicaid Other | Source: Ambulatory Visit | Attending: Orthopaedic Surgery | Admitting: Orthopaedic Surgery

## 2018-11-12 DIAGNOSIS — Z01818 Encounter for other preprocedural examination: Secondary | ICD-10-CM | POA: Insufficient documentation

## 2018-11-12 DIAGNOSIS — I5042 Chronic combined systolic (congestive) and diastolic (congestive) heart failure: Secondary | ICD-10-CM | POA: Insufficient documentation

## 2018-11-12 DIAGNOSIS — I110 Hypertensive heart disease with heart failure: Secondary | ICD-10-CM | POA: Diagnosis not present

## 2018-11-12 DIAGNOSIS — E785 Hyperlipidemia, unspecified: Secondary | ICD-10-CM | POA: Diagnosis not present

## 2018-11-12 DIAGNOSIS — M1612 Unilateral primary osteoarthritis, left hip: Secondary | ICD-10-CM | POA: Diagnosis not present

## 2018-11-12 DIAGNOSIS — Z79899 Other long term (current) drug therapy: Secondary | ICD-10-CM | POA: Insufficient documentation

## 2018-11-12 DIAGNOSIS — Z955 Presence of coronary angioplasty implant and graft: Secondary | ICD-10-CM | POA: Diagnosis not present

## 2018-11-12 DIAGNOSIS — I2510 Atherosclerotic heart disease of native coronary artery without angina pectoris: Secondary | ICD-10-CM | POA: Insufficient documentation

## 2018-11-12 DIAGNOSIS — Z7982 Long term (current) use of aspirin: Secondary | ICD-10-CM | POA: Insufficient documentation

## 2018-11-12 DIAGNOSIS — Z96641 Presence of right artificial hip joint: Secondary | ICD-10-CM | POA: Diagnosis not present

## 2018-11-12 HISTORY — DX: Hyperlipidemia, unspecified: E78.5

## 2018-11-12 HISTORY — DX: Heart failure, unspecified: I50.9

## 2018-11-12 HISTORY — DX: Nonrheumatic aortic (valve) stenosis: I35.0

## 2018-11-12 LAB — COMPREHENSIVE METABOLIC PANEL
ALT: 211 U/L — ABNORMAL HIGH (ref 0–44)
AST: 70 U/L — ABNORMAL HIGH (ref 15–41)
Albumin: 3.3 g/dL — ABNORMAL LOW (ref 3.5–5.0)
Alkaline Phosphatase: 148 U/L — ABNORMAL HIGH (ref 38–126)
Anion gap: 10 (ref 5–15)
BUN: 14 mg/dL (ref 6–20)
CO2: 24 mmol/L (ref 22–32)
Calcium: 8.6 mg/dL — ABNORMAL LOW (ref 8.9–10.3)
Chloride: 103 mmol/L (ref 98–111)
Creatinine, Ser: 0.82 mg/dL (ref 0.61–1.24)
GFR calc Af Amer: >60 mL/min (ref >60)
GFR calc non Af Amer: >60 mL/min (ref >60)
Glucose, Bld: 95 mg/dL (ref 70–99)
Potassium: 4.5 mmol/L (ref 3.5–5.1)
Sodium: 137 mmol/L (ref 135–145)
Total Bilirubin: 0.8 mg/dL (ref 0.3–1.2)
Total Protein: 6 g/dL — ABNORMAL LOW (ref 6.5–8.1)

## 2018-11-12 LAB — CBC WITH DIFFERENTIAL/PLATELET
Abs Immature Granulocytes: 0.02 10*3/uL (ref 0.00–0.07)
Basophils Absolute: 0 10*3/uL (ref 0.0–0.1)
Basophils Relative: 0 %
Eosinophils Absolute: 0.2 10*3/uL (ref 0.0–0.5)
Eosinophils Relative: 3 %
HCT: 43.8 % (ref 39.0–52.0)
Hemoglobin: 14.5 g/dL (ref 13.0–17.0)
Immature Granulocytes: 0 %
Lymphocytes Relative: 27 %
Lymphs Abs: 1.6 10*3/uL (ref 0.7–4.0)
MCH: 31.7 pg (ref 26.0–34.0)
MCHC: 33.1 g/dL (ref 30.0–36.0)
MCV: 95.8 fL (ref 80.0–100.0)
Monocytes Absolute: 0.6 10*3/uL (ref 0.1–1.0)
Monocytes Relative: 10 %
Neutro Abs: 3.7 10*3/uL (ref 1.7–7.7)
Neutrophils Relative %: 60 %
Platelets: 153 10*3/uL (ref 150–400)
RBC: 4.57 MIL/uL (ref 4.22–5.81)
RDW: 14.9 % (ref 11.5–15.5)
WBC: 6.1 10*3/uL (ref 4.0–10.5)
nRBC: 0 % (ref 0.0–0.2)

## 2018-11-12 LAB — HEMOGLOBIN A1C
Hgb A1c MFr Bld: 5.4 % (ref 4.8–5.6)
Mean Plasma Glucose: 108.28 mg/dL

## 2018-11-12 LAB — SURGICAL PCR SCREEN
MRSA, PCR: NEGATIVE
Staphylococcus aureus: NEGATIVE

## 2018-11-12 LAB — PROTIME-INR
INR: 1 (ref 0.8–1.2)
Prothrombin Time: 13 seconds (ref 11.4–15.2)

## 2018-11-12 LAB — TYPE AND SCREEN
ABO/RH(D): O POS
Antibody Screen: NEGATIVE

## 2018-11-12 LAB — GLUCOSE, CAPILLARY: Glucose-Capillary: 88 mg/dL (ref 70–99)

## 2018-11-12 MED ORDER — TRANEXAMIC ACID 1000 MG/10ML IV SOLN
2000.0000 mg | INTRAVENOUS | Status: DC
  Filled 2018-11-12: qty 20

## 2018-11-12 NOTE — Progress Notes (Signed)
Anesthesia Chart Review:  Case: [id number] Date/Time: 11/15/18 0958   Procedure: LEFT TOTAL HIP ARTHROPLASTY ANTERIOR APPROACH (Left )   Anesthesia type: Spinal   Pre-op diagnosis: left hip degenerative joint disease   Location: [facility] OR ROOM 04 / [place] OR   Surgeon: Leandrew Koyanagi, MD     PAT 11/12/18. He missed 11/11/18 PAT visit.   DISCUSSION: Patient is a 58 year old [name] scheduled for the above procedure. He is s/p right THA 04/26/18 (spinal anesthesia, quadratus lumborum regional block).   History includes former smoker, CAD (s/p DES mRCA & mLAD 12/29/16), cardiomyopathy (normalization of EF following DES x2 2018), combined chronic systolic and diastolic CHF (EF 85-02% 10/7410, 55-60% 04/2017), aortic stenosis (mean gradient 26 mmHg, ascending aorta 40 mm 04/2017), HTN, dyslipidemia. A1c was normal at 5.4% on 11/12/18.    Last seen by cardiologist Dr. Sallyanne Kuster on 12/25/17. He was unable to tolerate b-blocker due to severe bradycardia. 12 month follow-up with echo recommended to follow-up AS and ascending aorta size, sooner if symptoms of exertional angina, exertional dyspnea, or exertional syncope. He had given presurgical cardiac input prior to 04/26/18 surgery (see Letters tab).  11/12/18 CBC WNL. A1c 5.4. PT/INR WNL. CMET showed elevated LFTs with AST 70, ALT 211.  - Previously elevated AST 200, ALT 428. Advised to stop Lipitor and Tylenol and avoid ETOH at that time (was drinking 1-2 shots/week; two 12 ounce beers and sometimes mixed drinks several times a month). Acute viral hepatitis panel negative at that time.     Alk Ph  AST ALT 11/12/18 148  70 211 05/03/18 164  32 58 02/05/18 301  37 73 01/19/18 434  53 129 01/05/18 591  200 428  He has had intermittently elevated LFTs at least over the past year. Acute viral hepatitis panel was negative 12/2017. Reportedly drinks 1-2 mixed drinks per week. PLT count and PT/INR WNL. I called and spoke with patient. Had 2 mixed drinks on 11/07/18, but has been taking  up to 4-5 Excedrin per day along with a couple of Tylenol. He will refrain from using these for now and avoid ETOH over the weekend. He had mild diarrhea a couple of days ago after eating fast food, but otherwise no N/V or abdominal pain. He has follow-up with Dr. Wynetta Emery next month. In regards to his heart, he denied chest pain, syncope, dizziness, SOB.    Reviewed cardiac and elevated LFT history with anesthesiologists Charolett Bumpers, MD and Hoy Morn, MD.  Given normal PLT and INR, no order to repeat HPF on the day of surgery. Patient knows to avoid ETOH and acetaminophen products for now. NSAIDS also on hold. He tolerated spinal anesthesia, quadratus lumborum regional block for right THA in January.  Definitive anesthesia plan for the surgery to be determined following anesthesiologist evaluation.   VS: BP 108/65   Pulse 65   Temp 36.6 C (Oral)   Resp 18   Ht '6\' 1"'  (1.854 m)   Wt 100.4 kg   SpO2 99%   BMI 29.21 kg/m    PROVIDERS: Ladell Pier, MD is PCP [place], Dani Gobble, MD is cardiologist. Last visit    LABS:  Preoperative labs noted. See DISCUSSION. 11/11/18 COVID-19 test negative. (all labs ordered are listed, but only abnormal results are displayed)  Labs Reviewed  COMPREHENSIVE METABOLIC PANEL - Abnormal; Notable for the following components:      Result Value   Calcium 8.6 (*)    Total Protein 6.0 (*)  Albumin 3.3 (*)    AST 70 (*)    ALT 211 (*)    Alkaline Phosphatase 148 (*)    All other components within normal limits  SURGICAL PCR SCREEN  GLUCOSE, CAPILLARY  CBC WITH DIFFERENTIAL/PLATELET  PROTIME-INR  HEMOGLOBIN A1C  TYPE AND SCREEN     IMAGES: CXR 04/23/18: FINDINGS: Both lungs are clear. Negative for a pneumothorax. Heart and mediastinum are within normal limits. Atherosclerotic calcifications at the aortic arch. Trachea is midline. IMPRESSION: No active cardiopulmonary disease.   EKG: 12/25/17: NSR   CV: Carotid US 01/05/18: Final  Interpretation: Right Carotid: Velocities in the right ICA are consistent with a 1-39% stenosis.                Non-hemodynamically significant plaque <50% noted in the CCA. Left Carotid: Velocities in the left ICA are consistent with a 1-39% stenosis.               Non-hemodynamically significant plaque noted in the CCA. Vertebrals:  Bilateral vertebral arteries demonstrate antegrade flow. Subclavians: Normal flow hemodynamics were seen in bilateral subclavian              arteries.   Echo 04/23/17: Study Conclusions - Left ventricle: The cavity size was normal. Systolic function was   normal. The estimated ejection fraction was in the range of 55%   to 60%. Wall motion was normal; there were no regional wall   motion abnormalities. Left ventricular diastolic function   parameters were normal. - Aortic valve: Valve mobility was restricted. There was mild   regurgitation. Mean gradient (S): 26 mm Hg. Valve area (VTI):   1.62 cm^2. Valve area (Vmax): 1.47 cm^2. Valve area (Vmean): 1.36   cm^2. Regurgitation pressure half-time: 620 ms. - Aorta: Aortic root dimension: 39 mm (ED). Ascending aortic   diameter: 40 mm (S). - Aortic root: The aortic root was mildly dilated. - Ascending aorta: The ascending aorta was mildly dilated. - Mitral valve: There was mild regurgitation. - Left atrium: The atrium was mildly dilated. - Right ventricle: The cavity size was mildly dilated. Wall   thickness was normal. - Pulmonary arteries: Systolic pressure could not be accurately   estimated.  Aortic valve:   Trileaflet; severely thickened, severely calcified leaflets. Valve mobility was restricted.  Doppler:  Transvalvular velocity was within the normal range. There was no stenosis. There was mild regurgitation.    VTI ratio of LVOT to aortic valve: 0.26. Valve area (VTI): 1.62 cm^2. Indexed valve area (VTI): 0.71 cm^2/m^2. Peak velocity ratio of LVOT to aortic valve: 0.24. Valve area (Vmax): 1.47  cm^2. Indexed valve area (Vmax): 0.64 cm^2/m^2. Mean velocity ratio of LVOT to aortic valve: 0.22. Valve area (Vmean): 1.36 cm^2. Indexed valve area (Vmean): 0.6 cm^2/m^2. Mean gradient (S): 26 mm Hg. Peak gradient (S): 49 mm Hg.    Cardiac cath 12/29/16 (done following 12/27/16 showing LVEF 25-30%, possible bicuspid AV with mild-moderate AR, and suggestion of severe AS):  Prox RCA lesion, 25 %stenosed.  Dist RCA lesion, 25 %stenosed.  Dist Cx lesion, 60 %stenosed.  Prox Cx to Mid Cx lesion, 25 %stenosed.  There is moderate to severe left ventricular systolic dysfunction.  The left ventricular ejection fraction is 25-35% by visual estimate.  LV end diastolic pressure is moderately elevated.  There is no aortic valve stenosis.  Mid RCA lesion, 75 %stenosed. A STENT RESOLUTE ONYX [id number] drug eluting stent was successfully placed.  Post intervention, there is a 0% residual stenosis.  Mid  LAD lesion, 75 %stenosed. A STENT RESOLUTE ONYX [id number] drug eluting stent was successfully placed, post dilated to > 3 mm.  Post intervention, there is a 0% residual stenosis.  Hemodynamic findings consistent with mild pulmonary hypertension.  Ao sat 90%, PA sat 62%; CO 4.4 L/min, CI 2.1; mean PA pressure 34 mm Hg - Severe two vessel CAD which likely contributed to cardiomyopathy. - Aortic valve was easily crossed several times.  0.035" wire would cross aortic valve on its own during catheter exchanges.  Mean Ao valve gradient 9 mm Hg.  Ao valve area 1.94 cm2.   - After discussion with Dr. Marlou Porch, we decided to proceed with PCI since aortic valve intervention was not required.     Past Medical History:  Diagnosis Date  . Aortic stenosis   . CAD (coronary artery disease), native coronary artery 12/26/2016   DES LAD & RCA, EF 25-30%  . CHF (congestive heart failure) ([place])   . Dyslipidemia   . Hypertension   . Obesity   . Osteoarthritis   . Pre-diabetes    a. prior A1C 5.6, states he was  on meds for this at one point.    Past Surgical History:  Procedure Laterality Date  . CORONARY STENT INTERVENTION N/A 12/29/2016   Procedure: CORONARY STENT INTERVENTION;  Surgeon: Jettie Booze, MD;  Location: [place] [facility];  Service: Cardiovascular;  Laterality: N/A;  . RIGHT/LEFT HEART CATH AND CORONARY ANGIOGRAPHY N/A 12/29/2016   Procedure: RIGHT/LEFT HEART CATH AND CORONARY ANGIOGRAPHY;  Surgeon: Jettie Booze, MD;  Location: [place] [facility];  Service: Cardiovascular;  Laterality: N/A;  . TONSILLECTOMY    . TOTAL HIP ARTHROPLASTY Right 04/26/2018   Procedure: RIGHT TOTAL HIP ARTHROPLASTY ANTERIOR APPROACH;  Surgeon: Leandrew Koyanagi, MD;  Location: [facility];  Service: Orthopedics;  Laterality: Right;    MEDICATIONS: . aspirin EC 81 MG tablet  . carvedilol (COREG) 3.125 MG tablet  . lidocaine (LIDODERM) 5 %  . lisinopril (ZESTRIL) 10 MG tablet  . methocarbamol (ROBAXIN) 500 MG tablet  . potassium chloride (K-DUR) 10 MEQ tablet  . predniSONE (STERAPRED UNI-PAK 21 TAB) 10 MG (21) TBPK tablet  . senna-docusate (SENOKOT S) 8.6-50 MG tablet   No current facility-administered medications for this encounter.    Derrill Memo ON 11/15/2018] tranexamic acid (CYKLOKAPRON) 2,000 mg in sodium chloride 0.9 % 50 mL Topical Application  Not currently on prednisone or Senokot. He was instructed to follow-up with surgeon regarding perioperative ASA instructions.   Myra Gianotti, PA-C Surgical Short Stay/Anesthesiology [facility] Phone [phone] [facility] Phone [phone] 11/12/2018 5:50 PM

## 2018-11-12 NOTE — Pre-Procedure Instructions (Signed)
Joshua [name] [facility]  11/12/2018    Your procedure is scheduled on Monday, November 15, 2018 at 10:13 AM.   Report to [facility] Entrance "A" Admitting Office at 8:10 AM.   Call this number if you have problems the morning of surgery: [phone]   Remember:  Do not eat food after midnight Sunday, 11/14/18. You may have clear liquids until 7:15 AM. Clear liquids include: Water, Juice (non-citric or pulp), carbonated beverages, black coffee, clear tea, plain Jello, plain popsicles, Gatorade  Take these medicines the morning of surgery with A SIP OF WATER: Carvedilol )Coreg), Methocarbamol (Robaxin) - if needed  Drink Pre-Surgery Ensure at 7:15 AM.   Stop Aspirin as directed by physician/surgeon. Do not use NSAIDS (Ibuprofen, Aleve, etc), other Aspirin products, Herbal medications or Multivitamins prior to surgery.    Do not wear jewelry.  Do not wear lotions, powders, cologne or deodorant.  Men may shave face and neck.  Do not bring valuables to the hospital.  [facility] is not responsible for any belongings or valuables.  Contacts, dentures or bridgework may not be worn into surgery.  Leave your suitcase in the car.  After surgery it may be brought to your room.  For patients admitted to the hospital, discharge time will be determined by your treatment team.  [facility] - Preparing for Surgery  Before surgery, you can play an important role.  Because skin is not sterile, your skin needs to be as free of germs as possible.  You can reduce the number of germs on you skin by washing with CHG (chlorahexidine gluconate) soap before surgery.  CHG is an antiseptic cleaner which kills germs and bonds with the skin to continue killing germs even after washing.  Oral Hygiene is also important in reducing the risk of infection.  Remember to brush your teeth with your regular toothpaste the morning of surgery.  Please DO NOT use if you have an allergy to CHG or antibacterial soaps.  If  your skin becomes reddened/irritated stop using the CHG and inform your nurse when you arrive at Short Stay.  Do not shave (including legs and underarms) for at least 48 hours prior to the first CHG shower.  You may shave your face.  Please follow these instructions carefully:   1.  Shower with CHG Soap the night before surgery and the morning of Surgery.  2.  If you choose to wash your hair, wash your hair first as usual with your normal shampoo.  3.  After you shampoo, rinse your hair and body thoroughly to remove the shampoo. 4.  Use CHG as you would any other liquid soap.  You can apply chg directly to the skin and wash gently with a      scrungie or washcloth.           5.  Apply the CHG Soap to your body ONLY FROM THE NECK DOWN.   Do not use on open wounds or open sores. Avoid contact with your eyes, ears, mouth and genitals (private parts).  Wash genitals (private parts) with your normal soap - do this prior to using CHG soap.  6.  Wash thoroughly, paying special attention to the area where your surgery will be performed.  7.  Thoroughly rinse your body with warm water from the neck down.  8.  DO NOT shower/wash with your normal soap after using and rinsing off the CHG Soap.  9.  Pat yourself dry with a clean  towel.            10.  Wear clean pajamas.            11.  Place clean sheets on your bed the night of your first shower and do not sleep with pets.  Day of Surgery  Shower as above. Do not apply any lotions/deoderants the morning of surgery.   Please wear clean clothes to the hospital/surgery center. Remember to brush your teeth with toothpaste.   Please read over the fact sheets that you were given.

## 2018-11-12 NOTE — Anesthesia Preprocedure Evaluation (Addendum)
Anesthesia Evaluation  Patient identified by MRN, date of birth, ID band Patient awake    Reviewed: Allergy & Precautions, NPO status , Patient's Chart, lab work & pertinent test results  History of Anesthesia Complications Negative for: history of anesthetic complications  Airway Mallampati: II  TM Distance: >3 FB Neck ROM: Full    Dental no notable dental hx.    Pulmonary neg pulmonary ROS, former smoker,    Pulmonary exam normal        Cardiovascular hypertension, Pt. on medications + CAD, + Cardiac Stents (x2 2018) and +CHF  Normal cardiovascular exam  TTE 04/2017: EF 55-60%, mild AVR, mild MR, mildly dilated RV    Neuro/Psych negative neurological ROS     GI/Hepatic negative GI ROS, Neg liver ROS,   Endo/Other  negative endocrine ROS  Renal/GU negative Renal ROS     Musculoskeletal  (+) Arthritis ,   Abdominal   Peds  Hematology negative hematology ROS (+)   Anesthesia Other Findings Day of surgery medications reviewed with the patient.  Reproductive/Obstetrics                           Anesthesia Physical Anesthesia Plan  ASA: III  Anesthesia Plan: Spinal   Post-op Pain Management:    Induction:   PONV Risk Score and Plan: 2 and Propofol infusion, Treatment may vary due to age or medical condition, Ondansetron, Dexamethasone and Midazolam  Airway Management Planned: Natural Airway and Simple Face Mask  Additional Equipment: None  Intra-op Plan:   Post-operative Plan:   Informed Consent: I have reviewed the patients History and Physical, chart, labs and discussed the procedure including the risks, benefits and alternatives for the proposed anesthesia with the patient or authorized representative who has indicated his/her understanding and acceptance.     Dental advisory given  Plan Discussed with: CRNA  Anesthesia Plan Comments: (PAT note written 11/12/2018 by  Myra Gianotti, PA-C. )      Anesthesia Quick Evaluation

## 2018-11-12 NOTE — Progress Notes (Signed)
PCP - Dr. Karle Plumber Cardiologist - Dr. Sallyanne Kuster  Chest x-ray - 04/23/18 EKG - 12/24/17 Stress Test - denies ECHO - 04/23/17 Cardiac Cath - 12/19/16  Sleep Study - n/a CPAP -   Fasting Blood Sugar - n/a Checks Blood Sugar _____ times a day  Blood Thinner Instructions: n/a Aspirin Instructions: Pt has not been instructed to stop his Aspirin, he states he will stop it, but I told him not to do unless he talks with his cardiologist or surgeon  Anesthesia review: Spoke with Ebony Hail, pt had surgery in January of this year, has seen his cardiologist within the past year  Patient denies shortness of breath, fever, cough and chest pain at PAT appointment   Patient verbalized understanding of instructions that were given to them at the PAT appointment. Patient was also instructed that they will need to review over the PAT instructions again at home before surgery.  Pt had Covid test done yesterday, it is negative. Pt states he has been quarantined since, except to come to this appt and he had his mask on. Instructed pt to go home after this appt and stay until Monday AM for surgery.   Coronavirus Screening  Have you experienced the following symptoms:  Cough NO Fever (>100.65F)  NO Runny nose NO Sore throat NO Difficulty breathing/shortness of breath  NO  Have you or a family member traveled in the last 14 days and where? NO  Patient reminded that hospital visitation restrictions are in effect and the importance of the restrictions. Informed pt that inpatients will be allowed 1 visitor during visitation hours starting Monday, 11/15/18. Those hours have not been posted yet

## 2018-11-15 ENCOUNTER — Encounter (HOSPITAL_COMMUNITY): Payer: Self-pay

## 2018-11-15 ENCOUNTER — Other Ambulatory Visit: Payer: Self-pay

## 2018-11-15 ENCOUNTER — Observation Stay (HOSPITAL_COMMUNITY)
Admission: RE | Admit: 2018-11-15 | Discharge: 2018-11-16 | Disposition: A | Discharge status: Home / Self Care | Payer: Medicaid Other | Attending: Orthopaedic Surgery | Admitting: Orthopaedic Surgery

## 2018-11-15 ENCOUNTER — Inpatient Hospital Stay (HOSPITAL_COMMUNITY): Payer: Medicaid Other | Admitting: Vascular Surgery

## 2018-11-15 ENCOUNTER — Encounter (HOSPITAL_COMMUNITY)
Admission: RE | Disposition: A | Discharge status: Home / Self Care | Payer: Self-pay | Source: Home / Self Care | Attending: Orthopaedic Surgery

## 2018-11-15 ENCOUNTER — Inpatient Hospital Stay (HOSPITAL_COMMUNITY): Payer: Medicaid Other

## 2018-11-15 ENCOUNTER — Observation Stay (HOSPITAL_COMMUNITY): Payer: Medicaid Other

## 2018-11-15 ENCOUNTER — Inpatient Hospital Stay (HOSPITAL_COMMUNITY): Payer: Medicaid Other | Admitting: Anesthesiology

## 2018-11-15 DIAGNOSIS — Z96642 Presence of left artificial hip joint: Secondary | ICD-10-CM

## 2018-11-15 DIAGNOSIS — M1612 Unilateral primary osteoarthritis, left hip: Secondary | ICD-10-CM | POA: Diagnosis not present

## 2018-11-15 DIAGNOSIS — I5040 Unspecified combined systolic (congestive) and diastolic (congestive) heart failure: Secondary | ICD-10-CM | POA: Diagnosis not present

## 2018-11-15 DIAGNOSIS — Z7982 Long term (current) use of aspirin: Secondary | ICD-10-CM | POA: Diagnosis not present

## 2018-11-15 DIAGNOSIS — I2510 Atherosclerotic heart disease of native coronary artery without angina pectoris: Secondary | ICD-10-CM | POA: Insufficient documentation

## 2018-11-15 DIAGNOSIS — Z79899 Other long term (current) drug therapy: Secondary | ICD-10-CM | POA: Insufficient documentation

## 2018-11-15 DIAGNOSIS — Z96649 Presence of unspecified artificial hip joint: Secondary | ICD-10-CM

## 2018-11-15 DIAGNOSIS — Z955 Presence of coronary angioplasty implant and graft: Secondary | ICD-10-CM | POA: Insufficient documentation

## 2018-11-15 DIAGNOSIS — I350 Nonrheumatic aortic (valve) stenosis: Secondary | ICD-10-CM | POA: Diagnosis not present

## 2018-11-15 DIAGNOSIS — R7303 Prediabetes: Secondary | ICD-10-CM | POA: Insufficient documentation

## 2018-11-15 DIAGNOSIS — I509 Heart failure, unspecified: Secondary | ICD-10-CM | POA: Insufficient documentation

## 2018-11-15 DIAGNOSIS — Z87891 Personal history of nicotine dependence: Secondary | ICD-10-CM | POA: Diagnosis not present

## 2018-11-15 DIAGNOSIS — E669 Obesity, unspecified: Secondary | ICD-10-CM | POA: Insufficient documentation

## 2018-11-15 DIAGNOSIS — D62 Acute posthemorrhagic anemia: Secondary | ICD-10-CM | POA: Diagnosis not present

## 2018-11-15 DIAGNOSIS — Z419 Encounter for procedure for purposes other than remedying health state, unspecified: Secondary | ICD-10-CM

## 2018-11-15 DIAGNOSIS — Z471 Aftercare following joint replacement surgery: Secondary | ICD-10-CM | POA: Diagnosis not present

## 2018-11-15 DIAGNOSIS — Z6829 Body mass index (BMI) 29.0-29.9, adult: Secondary | ICD-10-CM | POA: Insufficient documentation

## 2018-11-15 DIAGNOSIS — I110 Hypertensive heart disease with heart failure: Secondary | ICD-10-CM | POA: Diagnosis not present

## 2018-11-15 HISTORY — PX: TOTAL HIP ARTHROPLASTY: SHX124

## 2018-11-15 LAB — GLUCOSE, CAPILLARY: Glucose-Capillary: 122 mg/dL — ABNORMAL HIGH (ref 70–99)

## 2018-11-15 SURGERY — ARTHROPLASTY, HIP, TOTAL, ANTERIOR APPROACH
Anesthesia: Spinal | Laterality: Left

## 2018-11-15 MED ORDER — POLYETHYLENE GLYCOL 3350 17 G PO PACK: 17.0000 g | PACK | Freq: Every day | ORAL | Status: DC | PRN

## 2018-11-15 MED ORDER — OXYCODONE HCL 5 MG PO TABS
10.0000 mg | ORAL_TABLET | ORAL | Status: DC | PRN
  Administered 2018-11-16 (×3): 15 mg via ORAL
  Filled 2018-11-15 (×2): qty 3

## 2018-11-15 MED ORDER — OXYCODONE HCL 5 MG PO TABS: 5.0000 mg | ORAL_TABLET | Freq: Once | ORAL | Status: DC | PRN

## 2018-11-15 MED ORDER — ACETAMINOPHEN 325 MG PO TABS: 325.0000 mg | ORAL_TABLET | Freq: Four times a day (QID) | ORAL | Status: DC | PRN

## 2018-11-15 MED ORDER — SODIUM CHLORIDE 0.9 % IV SOLN
INTRAVENOUS | Status: DC | PRN
  Administered 2018-11-15: 25 ug/min via INTRAVENOUS

## 2018-11-15 MED ORDER — OXYCODONE HCL 5 MG PO TABS
5.0000 mg | ORAL_TABLET | ORAL | Status: DC | PRN
  Filled 2018-11-15: qty 1
  Filled 2018-11-15: qty 2

## 2018-11-15 MED ORDER — ONDANSETRON HCL 4 MG/2ML IJ SOLN
INTRAMUSCULAR | Status: DC | PRN
  Administered 2018-11-15: 4 mg via INTRAVENOUS

## 2018-11-15 MED ORDER — ALUM & MAG HYDROXIDE-SIMETH 200-200-20 MG/5ML PO SUSP: 30.0000 mL | ORAL | Status: DC | PRN

## 2018-11-15 MED ORDER — MAGNESIUM CITRATE PO SOLN: 1.0000 | Freq: Once | ORAL | Status: DC | PRN

## 2018-11-15 MED ORDER — TRANEXAMIC ACID-NACL 1000-0.7 MG/100ML-% IV SOLN
1000.0000 mg | INTRAVENOUS | Status: AC
  Administered 2018-11-15: 1000 mg via INTRAVENOUS

## 2018-11-15 MED ORDER — GABAPENTIN 300 MG PO CAPS
300.0000 mg | ORAL_CAPSULE | Freq: Three times a day (TID) | ORAL | Status: DC
  Administered 2018-11-15 – 2018-11-16 (×3): 300 mg via ORAL
  Filled 2018-11-15 (×3): qty 1

## 2018-11-15 MED ORDER — SORBITOL 70 % SOLN: 30.0000 mL | Freq: Every day | Status: DC | PRN

## 2018-11-15 MED ORDER — ONDANSETRON HCL 4 MG/2ML IJ SOLN
INTRAMUSCULAR | Status: AC
  Filled 2018-11-15: qty 2

## 2018-11-15 MED ORDER — ACETAMINOPHEN 500 MG PO TABS
1000.0000 mg | ORAL_TABLET | Freq: Four times a day (QID) | ORAL | Status: AC
  Administered 2018-11-15 – 2018-11-16 (×4): 1000 mg via ORAL
  Filled 2018-11-15 (×4): qty 2

## 2018-11-15 MED ORDER — CEFAZOLIN SODIUM-DEXTROSE 2-4 GM/100ML-% IV SOLN
INTRAVENOUS | Status: AC
  Filled 2018-11-15: qty 100

## 2018-11-15 MED ORDER — METOCLOPRAMIDE HCL 5 MG/ML IJ SOLN: 5.0000 mg | Freq: Three times a day (TID) | INTRAMUSCULAR | Status: DC | PRN

## 2018-11-15 MED ORDER — PROPOFOL 500 MG/50ML IV EMUL
INTRAVENOUS | Status: DC | PRN
  Administered 2018-11-15: 50 ug/kg/min via INTRAVENOUS
  Administered 2018-11-15: 75 ug/kg/min via INTRAVENOUS

## 2018-11-15 MED ORDER — MIDAZOLAM HCL 2 MG/2ML IJ SOLN
INTRAMUSCULAR | Status: DC | PRN
  Administered 2018-11-15: 2 mg via INTRAVENOUS

## 2018-11-15 MED ORDER — POVIDONE-IODINE 10 % EX SWAB: 2.0000 "application " | Freq: Once | CUTANEOUS | Status: DC

## 2018-11-15 MED ORDER — DEXAMETHASONE SODIUM PHOSPHATE 10 MG/ML IJ SOLN
INTRAMUSCULAR | Status: DC | PRN
  Administered 2018-11-15: 5 mg via INTRAVENOUS

## 2018-11-15 MED ORDER — KETOROLAC TROMETHAMINE 15 MG/ML IJ SOLN
15.0000 mg | Freq: Four times a day (QID) | INTRAMUSCULAR | Status: DC
  Administered 2018-11-15 – 2018-11-16 (×3): 15 mg via INTRAVENOUS
  Filled 2018-11-15 (×4): qty 1

## 2018-11-15 MED ORDER — DEXAMETHASONE SODIUM PHOSPHATE 10 MG/ML IJ SOLN
INTRAMUSCULAR | Status: AC
  Filled 2018-11-15: qty 1

## 2018-11-15 MED ORDER — CHLORHEXIDINE GLUCONATE 4 % EX LIQD: 60.0000 mL | Freq: Once | CUTANEOUS | Status: DC

## 2018-11-15 MED ORDER — ASPIRIN EC 81 MG PO TBEC: 81.0000 mg | DELAYED_RELEASE_TABLET | Freq: Two times a day (BID) | ORAL | 0 refills | Status: DC

## 2018-11-15 MED ORDER — DEXAMETHASONE SODIUM PHOSPHATE 10 MG/ML IJ SOLN
10.0000 mg | Freq: Once | INTRAMUSCULAR | Status: AC
  Administered 2018-11-16: 10 mg via INTRAVENOUS
  Filled 2018-11-15: qty 1

## 2018-11-15 MED ORDER — ASPIRIN 81 MG PO CHEW
81.0000 mg | CHEWABLE_TABLET | Freq: Two times a day (BID) | ORAL | Status: DC
  Administered 2018-11-15 – 2018-11-16 (×2): 81 mg via ORAL
  Filled 2018-11-15 (×2): qty 1

## 2018-11-15 MED ORDER — PROMETHAZINE HCL 25 MG PO TABS: 25.0000 mg | ORAL_TABLET | Freq: Four times a day (QID) | ORAL | 1 refills | Status: DC | PRN

## 2018-11-15 MED ORDER — METHOCARBAMOL 1000 MG/10ML IJ SOLN
500.0000 mg | Freq: Four times a day (QID) | INTRAVENOUS | Status: DC | PRN
  Filled 2018-11-15: qty 5

## 2018-11-15 MED ORDER — TRANEXAMIC ACID 1000 MG/10ML IV SOLN
INTRAVENOUS | Status: DC | PRN
  Administered 2018-11-15: 2000 mg via TOPICAL

## 2018-11-15 MED ORDER — BUPIVACAINE IN DEXTROSE 0.75-8.25 % IT SOLN
INTRATHECAL | Status: DC | PRN
  Administered 2018-11-15: 1.8 mL via INTRATHECAL

## 2018-11-15 MED ORDER — ONDANSETRON HCL 4 MG PO TABS: 4.0000 mg | ORAL_TABLET | Freq: Four times a day (QID) | ORAL | Status: DC | PRN

## 2018-11-15 MED ORDER — LACTATED RINGERS IV BOLUS
500.0000 mL | Freq: Once | INTRAVENOUS | Status: AC
  Administered 2018-11-15: 500 mL via INTRAVENOUS

## 2018-11-15 MED ORDER — METHOCARBAMOL 750 MG PO TABS: 750.0000 mg | ORAL_TABLET | Freq: Two times a day (BID) | ORAL | 0 refills | Status: DC | PRN

## 2018-11-15 MED ORDER — TRANEXAMIC ACID-NACL 1000-0.7 MG/100ML-% IV SOLN
INTRAVENOUS | Status: AC
  Filled 2018-11-15: qty 100

## 2018-11-15 MED ORDER — TRANEXAMIC ACID-NACL 1000-0.7 MG/100ML-% IV SOLN
1000.0000 mg | Freq: Once | INTRAVENOUS | Status: AC
  Administered 2018-11-15: 1000 mg via INTRAVENOUS
  Filled 2018-11-15: qty 100

## 2018-11-15 MED ORDER — CARVEDILOL 3.125 MG PO TABS
3.1250 mg | ORAL_TABLET | Freq: Two times a day (BID) | ORAL | Status: DC
  Administered 2018-11-15 – 2018-11-16 (×2): 3.125 mg via ORAL
  Filled 2018-11-15 (×2): qty 1

## 2018-11-15 MED ORDER — DIPHENHYDRAMINE HCL 12.5 MG/5ML PO ELIX: 25.0000 mg | ORAL_SOLUTION | ORAL | Status: DC | PRN

## 2018-11-15 MED ORDER — ACETAMINOPHEN 10 MG/ML IV SOLN: 1000.0000 mg | Freq: Once | INTRAVENOUS | Status: DC | PRN

## 2018-11-15 MED ORDER — FENTANYL CITRATE (PF) 250 MCG/5ML IJ SOLN
INTRAMUSCULAR | Status: AC
  Filled 2018-11-15: qty 5

## 2018-11-15 MED ORDER — HYDROMORPHONE HCL 1 MG/ML IJ SOLN
0.5000 mg | INTRAMUSCULAR | Status: DC | PRN
  Administered 2018-11-15: 1 mg via INTRAVENOUS
  Filled 2018-11-15: qty 1

## 2018-11-15 MED ORDER — METHOCARBAMOL 500 MG PO TABS
500.0000 mg | ORAL_TABLET | Freq: Four times a day (QID) | ORAL | Status: DC | PRN
  Administered 2018-11-16 (×3): 500 mg via ORAL
  Filled 2018-11-15 (×3): qty 1

## 2018-11-15 MED ORDER — CEFAZOLIN SODIUM-DEXTROSE 2-4 GM/100ML-% IV SOLN
2.0000 g | INTRAVENOUS | Status: AC
  Administered 2018-11-15: 2 g via INTRAVENOUS

## 2018-11-15 MED ORDER — MENTHOL 3 MG MT LOZG: 1.0000 | LOZENGE | OROMUCOSAL | Status: DC | PRN

## 2018-11-15 MED ORDER — METOCLOPRAMIDE HCL 5 MG PO TABS: 5.0000 mg | ORAL_TABLET | Freq: Three times a day (TID) | ORAL | Status: DC | PRN

## 2018-11-15 MED ORDER — LACTATED RINGERS IV SOLN: INTRAVENOUS | Status: DC

## 2018-11-15 MED ORDER — OXYCODONE-ACETAMINOPHEN 5-325 MG PO TABS: 1.0000 | ORAL_TABLET | Freq: Three times a day (TID) | ORAL | 0 refills | Status: DC | PRN

## 2018-11-15 MED ORDER — ONDANSETRON HCL 4 MG/2ML IJ SOLN: 4.0000 mg | Freq: Four times a day (QID) | INTRAMUSCULAR | Status: DC | PRN

## 2018-11-15 MED ORDER — SENNOSIDES-DOCUSATE SODIUM 8.6-50 MG PO TABS: 1.0000 | ORAL_TABLET | Freq: Every evening | ORAL | 1 refills | Status: DC | PRN

## 2018-11-15 MED ORDER — VANCOMYCIN HCL 1 G IV SOLR
INTRAVENOUS | Status: DC | PRN
  Administered 2018-11-15: 1000 mg via TOPICAL

## 2018-11-15 MED ORDER — 0.9 % SODIUM CHLORIDE (POUR BTL) OPTIME
TOPICAL | Status: DC | PRN
  Administered 2018-11-15: 11:00:00 1000 mL

## 2018-11-15 MED ORDER — PHENOL 1.4 % MT LIQD: 1.0000 | OROMUCOSAL | Status: DC | PRN

## 2018-11-15 MED ORDER — SODIUM CHLORIDE 0.9 % IV SOLN: INTRAVENOUS | Status: DC

## 2018-11-15 MED ORDER — LACTATED RINGERS IV SOLN
INTRAVENOUS | Status: DC
  Administered 2018-11-15: 09:00:00 via INTRAVENOUS

## 2018-11-15 MED ORDER — DOCUSATE SODIUM 100 MG PO CAPS
100.0000 mg | ORAL_CAPSULE | Freq: Two times a day (BID) | ORAL | Status: DC
  Administered 2018-11-15 – 2018-11-16 (×2): 100 mg via ORAL
  Filled 2018-11-15 (×2): qty 1

## 2018-11-15 MED ORDER — SODIUM CHLORIDE 0.9 % IR SOLN
Status: DC | PRN
  Administered 2018-11-15: 3000 mL

## 2018-11-15 MED ORDER — LISINOPRIL 10 MG PO TABS
10.0000 mg | ORAL_TABLET | Freq: Two times a day (BID) | ORAL | Status: DC
  Filled 2018-11-15 (×2): qty 1

## 2018-11-15 MED ORDER — OXYCODONE HCL ER 10 MG PO T12A: 10.0000 mg | EXTENDED_RELEASE_TABLET | Freq: Two times a day (BID) | ORAL | 0 refills | Status: AC

## 2018-11-15 MED ORDER — PROMETHAZINE HCL 25 MG/ML IJ SOLN: 6.2500 mg | INTRAMUSCULAR | Status: DC | PRN

## 2018-11-15 MED ORDER — ONDANSETRON HCL 4 MG PO TABS: 4.0000 mg | ORAL_TABLET | Freq: Three times a day (TID) | ORAL | 0 refills | Status: DC | PRN

## 2018-11-15 MED ORDER — VANCOMYCIN HCL 1000 MG IV SOLR
INTRAVENOUS | Status: AC
  Filled 2018-11-15: qty 1000

## 2018-11-15 MED ORDER — FENTANYL CITRATE (PF) 100 MCG/2ML IJ SOLN: 25.0000 ug | INTRAMUSCULAR | Status: DC | PRN

## 2018-11-15 MED ORDER — ALBUMIN HUMAN 5 % IV SOLN
INTRAVENOUS | Status: DC | PRN
  Administered 2018-11-15: 11:00:00 via INTRAVENOUS

## 2018-11-15 MED ORDER — OXYCODONE HCL 5 MG/5ML PO SOLN: 5.0000 mg | Freq: Once | ORAL | Status: DC | PRN

## 2018-11-15 MED ORDER — POTASSIUM CHLORIDE ER 10 MEQ PO TBCR
10.0000 meq | EXTENDED_RELEASE_TABLET | Freq: Every day | ORAL | Status: DC
  Administered 2018-11-16: 10 meq via ORAL
  Filled 2018-11-15 (×2): qty 1

## 2018-11-15 MED ORDER — CEFAZOLIN SODIUM-DEXTROSE 2-4 GM/100ML-% IV SOLN
2.0000 g | Freq: Four times a day (QID) | INTRAVENOUS | Status: AC
  Administered 2018-11-15 – 2018-11-16 (×3): 2 g via INTRAVENOUS
  Filled 2018-11-15 (×3): qty 100

## 2018-11-15 MED ORDER — OXYCODONE HCL ER 10 MG PO T12A
10.0000 mg | EXTENDED_RELEASE_TABLET | Freq: Two times a day (BID) | ORAL | Status: DC
  Administered 2018-11-15 – 2018-11-16 (×3): 10 mg via ORAL
  Filled 2018-11-15 (×3): qty 1

## 2018-11-15 MED ORDER — MIDAZOLAM HCL 2 MG/2ML IJ SOLN
INTRAMUSCULAR | Status: AC
  Filled 2018-11-15: qty 2

## 2018-11-15 SURGICAL SUPPLY — 64 items
BAG DECANTER FOR FLEXI CONT (MISCELLANEOUS) ×3 IMPLANT
CELLS DAT CNTRL 66122 CELL SVR (MISCELLANEOUS) IMPLANT
CLOSURE STERI-STRIP 1/2X4 (GAUZE/BANDAGES/DRESSINGS) ×1
CLSR STERI-STRIP ANTIMIC 1/2X4 (GAUZE/BANDAGES/DRESSINGS) ×1 IMPLANT
COVER PERINEAL POST (MISCELLANEOUS) ×3 IMPLANT
COVER SURGICAL LIGHT HANDLE (MISCELLANEOUS) ×3 IMPLANT
COVER WAND RF STERILE (DRAPES) ×1 IMPLANT
DRAPE C-ARM 42X72 X-RAY (DRAPES) ×3 IMPLANT
DRAPE POUCH INSTRU U-SHP 10X18 (DRAPES) ×3 IMPLANT
DRAPE STERI IOBAN 125X83 (DRAPES) ×3 IMPLANT
DRAPE U-SHAPE 47X51 STRL (DRAPES) ×6 IMPLANT
DRESSING AQUACEL AG SP 3.5X10 (GAUZE/BANDAGES/DRESSINGS) IMPLANT
DRSG AQUACEL AG ADV 3.5X10 (GAUZE/BANDAGES/DRESSINGS) ×1 IMPLANT
DRSG AQUACEL AG SP 3.5X10 (GAUZE/BANDAGES/DRESSINGS) ×3
DURAPREP 26ML APPLICATOR (WOUND CARE) ×6 IMPLANT
ELECT BLADE 4.0 EZ CLEAN MEGAD (MISCELLANEOUS) ×3
ELECT REM PT RETURN 9FT ADLT (ELECTROSURGICAL) ×3
ELECTRODE BLDE 4.0 EZ CLN MEGD (MISCELLANEOUS) ×1 IMPLANT
ELECTRODE REM PT RTRN 9FT ADLT (ELECTROSURGICAL) ×1 IMPLANT
GLOVE BIOGEL PI IND STRL 6.5 (GLOVE) IMPLANT
GLOVE BIOGEL PI IND STRL 7.0 (GLOVE) ×1 IMPLANT
GLOVE BIOGEL PI INDICATOR 6.5 (GLOVE) ×2
GLOVE BIOGEL PI INDICATOR 7.0 (GLOVE) ×4
GLOVE ECLIPSE 7.0 STRL STRAW (GLOVE) ×6 IMPLANT
GLOVE SKINSENSE NS SZ7.5 (GLOVE) ×2
GLOVE SKINSENSE STRL SZ7.5 (GLOVE) ×1 IMPLANT
GLOVE SURG SS PI 6.5 STRL IVOR (GLOVE) ×2 IMPLANT
GLOVE SURG SS PI 7.0 STRL IVOR (GLOVE) ×2 IMPLANT
GLOVE SURG SYN 7.5  E (GLOVE) ×8
GLOVE SURG SYN 7.5 E (GLOVE) ×4 IMPLANT
GLOVE SURG SYN 7.5 PF PI (GLOVE) ×4 IMPLANT
GOWN STRL NON-REIN LRG LVL3 (GOWN DISPOSABLE) IMPLANT
GOWN STRL REIN XL XLG (GOWN DISPOSABLE) ×5 IMPLANT
GOWN STRL REUS W/ TWL LRG LVL3 (GOWN DISPOSABLE) IMPLANT
GOWN STRL REUS W/ TWL XL LVL3 (GOWN DISPOSABLE) ×1 IMPLANT
GOWN STRL REUS W/TWL LRG LVL3 (GOWN DISPOSABLE) ×4
GOWN STRL REUS W/TWL XL LVL3 (GOWN DISPOSABLE)
HANDPIECE INTERPULSE COAX TIP (DISPOSABLE) ×2
HEAD CERAMIC 36 PLUS5 (Hips) ×2 IMPLANT
HOOD PEEL AWAY FLYTE STAYCOOL (MISCELLANEOUS) ×6 IMPLANT
IV NS IRRIG 3000ML ARTHROMATIC (IV SOLUTION) ×3 IMPLANT
KIT BASIN OR (CUSTOM PROCEDURE TRAY) ×3 IMPLANT
LINER NEUTRAL 52X36MM PLUS 4 (Liner) ×2 IMPLANT
MARKER SKIN DUAL TIP RULER LAB (MISCELLANEOUS) ×3 IMPLANT
NDL SPNL 18GX3.5 QUINCKE PK (NEEDLE) ×1 IMPLANT
NEEDLE SPNL 18GX3.5 QUINCKE PK (NEEDLE) ×3 IMPLANT
PACK TOTAL JOINT (CUSTOM PROCEDURE TRAY) ×3 IMPLANT
PACK UNIVERSAL I (CUSTOM PROCEDURE TRAY) ×3 IMPLANT
PIN SECTOR W/GRIP ACE CUP 52MM (Hips) ×2 IMPLANT
RETRACTOR WND ALEXIS 18 MED (MISCELLANEOUS) IMPLANT
RTRCTR WOUND ALEXIS 18CM MED (MISCELLANEOUS)
SAW OSC TIP CART 19.5X105X1.3 (SAW) ×3 IMPLANT
SET HNDPC FAN SPRY TIP SCT (DISPOSABLE) ×1 IMPLANT
STAPLER VISISTAT 35W (STAPLE) IMPLANT
STEM FEM SZ3 STD ACTIS (Stem) ×2 IMPLANT
SUT ETHIBOND 2 V 37 (SUTURE) ×3 IMPLANT
SUT VIC AB 1 CTX 36 (SUTURE) ×2
SUT VIC AB 1 CTX36XBRD ANBCTR (SUTURE) ×1 IMPLANT
SUT VIC AB 2-0 CT1 27 (SUTURE) ×6
SUT VIC AB 2-0 CT1 TAPERPNT 27 (SUTURE) ×2 IMPLANT
SYR 50ML LL SCALE MARK (SYRINGE) ×3 IMPLANT
TOWEL GREEN STERILE (TOWEL DISPOSABLE) ×3 IMPLANT
TRAY CATH 16FR W/PLASTIC CATH (SET/KITS/TRAYS/PACK) IMPLANT
YANKAUER SUCT BULB TIP NO VENT (SUCTIONS) ×3 IMPLANT

## 2018-11-15 NOTE — Transfer of Care (Signed)
Immediate Anesthesia Transfer of Care Note  Patient: Joshua [name]  Procedure(s) Performed: LEFT TOTAL HIP ARTHROPLASTY ANTERIOR APPROACH (Left )  Patient Location: PACU  Anesthesia Type:Spinal  Level of Consciousness: drowsy  Airway & Oxygen Therapy: Patient Spontanous Breathing and Patient connected to face mask oxygen  Post-op Assessment: Report given to RN and Post -op Vital signs reviewed and stable  Post vital signs: Reviewed and stable  Last Vitals:  Vitals Value Taken Time  BP 71/49 11/15/18 1202  Temp 36.4 C 11/15/18 1200  Pulse 53 11/15/18 1202  Resp 20 11/15/18 1202  SpO2 100 % 11/15/18 1202  Vitals shown include unvalidated device data.  Last Pain:  Vitals:   11/15/18 0848  PainSc: 4       Patients Stated Pain Goal: 3 (82/95/62 1308)  Complications: No apparent anesthesia complications

## 2018-11-15 NOTE — Anesthesia Procedure Notes (Signed)
Procedure Name: MAC Date/Time: 11/15/2018 10:31 AM Performed by: Kathryne Hitch, CRNA Pre-anesthesia Checklist: Patient identified, Emergency Drugs available, Suction available and Patient being monitored Patient Re-evaluated:Patient Re-evaluated prior to induction Oxygen Delivery Method: Simple face mask Preoxygenation: Pre-oxygenation with 100% oxygen Induction Type: IV induction Dental Injury: Teeth and Oropharynx as per pre-operative assessment

## 2018-11-15 NOTE — Anesthesia Postprocedure Evaluation (Signed)
Anesthesia Post Note  Patient: Joshua [name]  Procedure(s) Performed: LEFT TOTAL HIP ARTHROPLASTY ANTERIOR APPROACH (Left )     Patient location during evaluation: PACU Anesthesia Type: Spinal Level of consciousness: awake and alert and oriented Pain management: pain level controlled Vital Signs Assessment: post-procedure vital signs reviewed and stable Respiratory status: spontaneous breathing, nonlabored ventilation and respiratory function stable Cardiovascular status: blood pressure returned to baseline Postop Assessment: no apparent nausea or vomiting and spinal receding Anesthetic complications: no    Last Vitals:  Vitals:   11/15/18 1314 11/15/18 1315  BP: (!) 88/60 (!) 121/56  Pulse: (!) 54 (!) 56  Resp: 14 16  Temp:  36.4 C  SpO2: 99% 98%    Last Pain:  Vitals:   11/15/18 1315  PainSc: 0-No pain            L Sensory Level: S1-Sole of foot, small toes (11/15/18 1315) R Sensory Level: S1-Sole of foot, small toes (11/15/18 1315)  Brennan Bailey

## 2018-11-15 NOTE — Anesthesia Procedure Notes (Signed)
Spinal  Patient location during procedure: OR Start time: 11/15/2018 10:14 AM End time: 11/15/2018 10:16 AM Staffing Anesthesiologist: Brennan Bailey, MD Performed: anesthesiologist  Preanesthetic Checklist Completed: patient identified, surgical consent, pre-op evaluation, timeout performed, IV checked, risks and benefits discussed and monitors and equipment checked Spinal Block Patient position: sitting Prep: site prepped and draped and DuraPrep Patient monitoring: cardiac monitor, continuous pulse ox and blood pressure Approach: midline Location: L3-4 Injection technique: single-shot Needle Needle type: [place]  Needle gauge: 22 G Needle length: 9 cm Additional Notes Risks, benefits, and alternative discussed. Patient gave consent to procedure. Prepped and draped in sitting position. Clear CSF obtained after one needle pass. Positive terminal aspiration. No pain or paraesthesias with injection. Patient tolerated procedure well. Vital signs stable. Tawny Asal, MD

## 2018-11-15 NOTE — Op Note (Signed)
LEFT TOTAL HIP ARTHROPLASTY ANTERIOR APPROACH  Procedure Note Joshua [name]   [id number]  Pre-op Diagnosis: left hip degenerative joint disease     Post-op Diagnosis: same   Operative Procedures  1. Total hip replacement; Left hip; uncemented [id number]   Personnel  Surgeon(s): Leandrew Koyanagi, MD  Assist: Madalyn Rob, PA-C; necessary for the timely completion of procedure and due to complexity of procedure.   Anesthesia: spinal  Prosthesis: Depuy Acetabulum: Pinnacle 52 mm Femur: Actis 3 STD Head: 36 mm size: +5 Liner: +4 neutral Bearing Type: ceramic on poly  Total Hip Arthroplasty (Anterior Approach) Op Note:  After informed consent was obtained and the operative extremity marked in the holding area, the patient was brought back to the operating room and placed supine on the HANA table. Next, the operative extremity was prepped and draped in normal sterile fashion. Surgical timeout occurred verifying patient identification, surgical site, surgical procedure and administration of antibiotics.  A modified anterior Smith-Peterson approach to the hip was performed, using the interval between tensor fascia lata and sartorius.  Dissection was carried bluntly down onto the anterior hip capsule. The lateral femoral circumflex vessels were identified and coagulated. A capsulotomy was performed and the capsular flaps tagged for later repair.  Fluoroscopy was utilized to prepare for the femoral neck cut. The neck osteotomy was performed. The femoral head was removed, the acetabular rim was cleared of soft tissue and attention was turned to reaming the acetabulum.  Sequential reaming was performed under fluoroscopic guidance. We reamed to a size 51 mm, and then impacted the acetabular shell. The liner was then placed after irrigation and attention turned to the femur.  After placing the femoral hook, the leg was taken to externally rotated, extended and adducted position taking  care to perform soft tissue releases to allow for adequate mobilization of the femur. Soft tissue was cleared from the shoulder of the greater trochanter and the hook elevator used to improve exposure of the proximal femur. Sequential broaching performed up to a size 3. Trial neck and head were placed. The leg was brought back up to neutral and the construct reduced. The position and sizing of components, offset and leg lengths were checked using fluoroscopy. Stability of the construct was checked in extension and external rotation without any subluxation or impingement of prosthesis. We dislocated the prosthesis, dropped the leg back into position, removed trial components, and irrigated copiously. The final stem and head was then placed, the leg brought back up, the system reduced and fluoroscopy used to verify positioning.  We irrigated, obtained hemostasis and closed the capsule using #2 ethibond suture.  One gram of vancomycin powder was placed in the surgical bed. The fascia was closed with #1 vicryl plus, the deep fat layer was closed with 0 vicryl, the subcutaneous layers closed with 2.0 Vicryl Plus and the skin closed with 4.0 monocryl and steri strips. A sterile dressing was applied. The patient was awakened in the operating room and taken to recovery in stable condition.  All sponge, needle, and instrument counts were correct at the end of the case.   Position: supine  Complications: see description of procedure.  Time Out: performed   Drains/Packing: none  Estimated blood loss: see anesthesia record  Returned to Recovery Room: in good condition.   Antibiotics: yes   Mechanical VTE (DVT) Prophylaxis: sequential compression devices, TED thigh-high  Chemical VTE (DVT) Prophylaxis: aspirin   Fluid Replacement: see anesthesia record  Specimens Removed: 1  to pathology   Sponge and Instrument Count Correct? yes   PACU: portable radiograph - low AP   Plan/RTC: Return in 2 weeks for  staple removal. Weight Bearing/Load Lower Extremity: full  Hip precautions: none Suture Removal: 2 weeks   N. Eduard Roux, MD [facility] 11:25 AM   Implant Name Type Inv. Item Serial No. Manufacturer Lot No. LRB No. Used Action  PIN SECTOR W/GRIP ACE CUP 52MM - [id number] Hips PIN SECTOR W/GRIP ACE CUP 52MM  DEPUY SYNTHES [id number] Left 1 Implanted  LINER NEUTRAL 52X36MM PLUS 4 - [id number] Liner LINER NEUTRAL 52X36MM PLUS 4  DEPUY SYNTHES [id number] Left 1 Implanted  STEM FEM SZ3 STD ACTIS - [id number] Stem STEM FEM SZ3 STD ACTIS  DEPUY ORTHOPAEDICS [id number] Left 1 Implanted  HEAD CERAMIC 36 PLUS5 - [id number] Hips HEAD CERAMIC 36 PLUS5  DEPUY SYNTHES [id number] Left 1 Implanted

## 2018-11-15 NOTE — Discharge Instructions (Signed)

## 2018-11-15 NOTE — Progress Notes (Signed)
PT Cancellation Note  Patient Details Name: Joshua [name] MRN: [id number] DOB: Mar 28, 1961   Cancelled Treatment:    Reason Eval/Treat Not Completed: Fatigue/lethargy limiting ability to participate Pt very lethargic and unable to fully awaken to answer questions. When calling pt's name, pt repeating "baby, baby, baby" and then falling quickly back to sleep. Notified RN. Will follow up as pt's alertness improves and as schedule allows.  Leighton Ruff, PT, DPT  Acute Rehabilitation Services  Pager: [phone] Office: [phone]     Rudean Hitt 11/15/2018, 6:32 PM

## 2018-11-15 NOTE — H&P (Signed)
PREOPERATIVE H&P  Chief Complaint: left hip degenerative joint disease  HPI: Joshua [name] is a 58 y.o. male who presents for surgical treatment of left hip degenerative joint disease.  He denies any changes in medical history.  Past Medical History:  Diagnosis Date  . Aortic stenosis   . CAD (coronary artery disease), native coronary artery 12/26/2016   DES LAD & RCA, EF 25-30%  . CHF (congestive heart failure) ([place])   . Dyslipidemia   . Hypertension   . Obesity   . Osteoarthritis   . Pre-diabetes    a. prior A1C 5.6, states he was on meds for this at one point.   Past Surgical History:  Procedure Laterality Date  . CORONARY STENT INTERVENTION N/A 12/29/2016   Procedure: CORONARY STENT INTERVENTION;  Surgeon: Jettie Booze, MD;  Location: [name];  Service: Cardiovascular;  Laterality: N/A;  . RIGHT/LEFT HEART CATH AND CORONARY ANGIOGRAPHY N/A 12/29/2016   Procedure: RIGHT/LEFT HEART CATH AND CORONARY ANGIOGRAPHY;  Surgeon: Jettie Booze, MD;  Location: [facility];  Service: Cardiovascular;  Laterality: N/A;  . TONSILLECTOMY    . TOTAL HIP ARTHROPLASTY Right 04/26/2018   Procedure: RIGHT TOTAL HIP ARTHROPLASTY ANTERIOR APPROACH;  Surgeon: Leandrew Koyanagi, MD;  Location: [facility];  Service: Orthopedics;  Laterality: Right;   Social History   Socioeconomic History  . Marital status: Single    Spouse name: Not on file  . Number of children: Not on file  . Years of education: Not on file  . Highest education level: Not on file  Occupational History  . Not on file  Social Needs  . Financial resource strain: Not on file  . Food insecurity    Worry: Not on file    Inability: Not on file  . Transportation needs    Medical: Not on file    Non-medical: Not on file  Tobacco Use  . Smoking status: Former Research scientist (life sciences)  . Smokeless tobacco: Never Used  . Tobacco comment: Smoked lightly for approx 20 years, quit 1990s  Substance and Sexual Activity   . Alcohol use: Yes    Comment: 1-2 mixed drinks per week  . Drug use: Yes    Types: Marijuana    Comment: occasional  . Sexual activity: Not on file  Lifestyle  . Physical activity    Days per week: Not on file    Minutes per session: Not on file  . Stress: Not on file  Relationships  . Social Herbalist on phone: Not on file    Gets together: Not on file    Attends religious service: Not on file    Active member of club or organization: Not on file    Attends meetings of clubs or organizations: Not on file    Relationship status: Not on file  Other Topics Concern  . Not on file  Social History Narrative   Lives Home alone. Sister makes all health related decisions. Declined Advanced directive at this time   Family History  Problem Relation Age of Onset  . CAD Father        MI/CABG age 46   No Known Allergies Prior to Admission medications   Medication Sig Start Date End Date Taking? Authorizing Provider  aspirin EC 81 MG tablet Take 1 tablet (81 mg total) by mouth 2 (two) times daily. 04/26/18  Yes Leandrew Koyanagi, MD  carvedilol (COREG) 3.125 MG tablet Take 1 tablet (  3.125 mg total) by mouth 2 (two) times daily. 08/25/18 08/20/19 Yes Edwards, Michelle P, NP  lidocaine (LIDODERM) 5 % PLACE 1 PATCH ONTO THE SKIN DAILY. REMOVE & DISCARD PATCH WITHIN 12 HOURS OR AS DIRECTED BY DOCTOR. Patient taking differently: Place 1-2 patches onto the skin daily.  08/30/18  Yes Ladell Pier, MD  lisinopril (ZESTRIL) 10 MG tablet Take 1 tablet (10 mg total) by mouth 2 (two) times daily. Needs appt for more refills. 10/28/18  Yes Ladell Pier, MD  methocarbamol (ROBAXIN) 500 MG tablet Take 1 tablet (500 mg total) by mouth 2 (two) times daily as needed for muscle spasms. 10/28/18  Yes Aundra Dubin, PA-C  potassium chloride (K-DUR) 10 MEQ tablet Take 10 mEq by mouth daily.   Yes [provider]  predniSONE (STERAPRED UNI-PAK 21 TAB) 10 MG (21) TBPK tablet Take as  directed Patient not taking: Reported on 11/10/2018 10/28/18   Aundra Dubin, PA-C  senna-docusate (SENOKOT S) 8.6-50 MG tablet Take 1 tablet by mouth at bedtime as needed. Patient not taking: Reported on 11/10/2018 04/26/18   Leandrew Koyanagi, MD     Positive ROS: All other systems have been reviewed and were otherwise negative with the exception of those mentioned in the HPI and as above.  Physical Exam: General: Alert, no acute distress Cardiovascular: No pedal edema Respiratory: No cyanosis, no use of accessory musculature GI: abdomen soft Skin: No lesions in the area of chief complaint Neurologic: Sensation intact distally Psychiatric: Patient is competent for consent with normal mood and affect Lymphatic: no lymphedema  MUSCULOSKELETAL: exam stable  Assessment: left hip degenerative joint disease  Plan: Plan for Procedure(s): LEFT TOTAL HIP ARTHROPLASTY ANTERIOR APPROACH  The risks benefits and alternatives were discussed with the patient including but not limited to the risks of nonoperative treatment, versus surgical intervention including infection, bleeding, nerve injury,  blood clots, cardiopulmonary complications, morbidity, mortality, among others, and they were willing to proceed.   Preoperative templating of the joint replacement has been completed, documented, and submitted to the Operating Room personnel in order to optimize intra-operative equipment management.  Eduard Roux, MD   11/15/2018 6:50 AM

## 2018-11-16 ENCOUNTER — Encounter (HOSPITAL_COMMUNITY): Payer: Self-pay | Admitting: General Practice

## 2018-11-16 DIAGNOSIS — M1612 Unilateral primary osteoarthritis, left hip: Secondary | ICD-10-CM | POA: Diagnosis not present

## 2018-11-16 LAB — CBC
HCT: 35.6 % — ABNORMAL LOW (ref 39.0–52.0)
Hemoglobin: 12.2 g/dL — ABNORMAL LOW (ref 13.0–17.0)
MCH: 31.8 pg (ref 26.0–34.0)
MCHC: 34.3 g/dL (ref 30.0–36.0)
MCV: 92.7 fL (ref 80.0–100.0)
Platelets: 129 10*3/uL — ABNORMAL LOW (ref 150–400)
RBC: 3.84 MIL/uL — ABNORMAL LOW (ref 4.22–5.81)
RDW: 14 % (ref 11.5–15.5)
WBC: 11.3 10*3/uL — ABNORMAL HIGH (ref 4.0–10.5)
nRBC: 0 % (ref 0.0–0.2)

## 2018-11-16 LAB — BASIC METABOLIC PANEL
Anion gap: 9 (ref 5–15)
BUN: 11 mg/dL (ref 6–20)
CO2: 25 mmol/L (ref 22–32)
Calcium: 8.4 mg/dL — ABNORMAL LOW (ref 8.9–10.3)
Chloride: 106 mmol/L (ref 98–111)
Creatinine, Ser: 0.67 mg/dL (ref 0.61–1.24)
GFR calc Af Amer: >60 mL/min (ref >60)
GFR calc non Af Amer: >60 mL/min (ref >60)
Glucose, Bld: 123 mg/dL — ABNORMAL HIGH (ref 70–99)
Potassium: 3.9 mmol/L (ref 3.5–5.1)
Sodium: 140 mmol/L (ref 135–145)

## 2018-11-16 NOTE — Progress Notes (Signed)
Physical Therapy Treatment Patient Details Name: Joshua [name] MRN: [id number] DOB: 26-Mar-1961 Today's Date: 11/16/2018    History of Present Illness Pt is a 58 y.o. male s/p elevtive L THA (direct anterior) 11/15/18. Of note, pt s/p recent R THA 04/2018. Other PMH includes CAD, HTN, pre-DM.   PT Comments    Pt progressing well with mobility. Mod indep ambulating with RW, also ambulating without device at supervision-level; pt with poor attention, frequent cues for education/safety. Preparing to d/c home this afternoon. Pt in agreement he does not require HHPT services. Has met short-term acute PT goals; will d/c PT.    Follow Up Recommendations  Follow surgeon's recommendation for DC plan and follow-up therapies;No PT follow up     Equipment Recommendations  Rolling walker with 5" wheels    Recommendations for Other Services       Precautions / Restrictions Precautions Precautions: Fall Restrictions Weight Bearing Restrictions: Yes LLE Weight Bearing: Weight bearing as tolerated    Mobility  Bed Mobility Overal bed mobility: Independent                Transfers Overall transfer level: Independent Equipment used: None             General transfer comment: Indep standing with and without RW  Ambulation/Gait Ambulation/Gait assistance: Modified independent (Device/Increase time) Gait Distance (Feet): 600 Feet Assistive device: Rolling walker (2 wheeled);None Gait Pattern/deviations: Step-through pattern;Decreased stride length Gait velocity: Decreased Gait velocity interpretation: >2.62 ft/sec, indicative of community ambulatory General Gait Details: Mod indep ambulation with RW, intermittent cues for safety. Pt also ambulating in room without DME, supervision for safety. Educ pt if he is going to pick up RW or carrying things in one hand, safer to not use RW altogether   Stairs Stairs: Yes Stairs assistance: Modified independent (Device/Increase  time) Stair Management: One rail Right;Forwards;Alternating pattern Number of Stairs: 5 General stair comments: Cues for technique, but pt able to perform alternating steps   Wheelchair Mobility    Modified Rankin (Stroke Patients Only)       Balance Overall balance assessment: Needs assistance   Sitting balance-Leahy Scale: Good       Standing balance-Leahy Scale: Fair Standing balance comment: Can static stand and take steps without UE support                            Cognition Arousal/Alertness: Awake/alert Behavior During Therapy: WFL for tasks assessed/performed Overall Cognitive Status: No family/caregiver present to determine baseline cognitive functioning Area of Impairment: Attention;Following commands;Safety/judgement                   Current Attention Level: Sustained   Following Commands: Follows multi-step commands inconsistently Safety/Judgement: Decreased awareness of safety     General Comments: Pt with very poor attention, difficulty multitasking, verbose with speech; likely baseline cognition      Exercises Total Joint Exercises Hip ABduction/ADduction: AROM;Standing;Both Marching in Standing: AROM;Standing;Both Standing Hip Extension: AROM;Both;Standing    General Comments General comments (skin integrity, edema, etc.): Pt preparing to d/c home this afternoon; pt in agreement he does not need HHPT      Pertinent Vitals/Pain Pain Assessment: Faces Pain Score: 8  Faces Pain Scale: Hurts little more Pain Location: LLE Pain Descriptors / Indicators: Guarding;Discomfort Pain Intervention(s): Monitored during session;Premedicated before session    Home Living Family/patient expects to be discharged to:: Unsure Living Arrangements: Alone Available Help at Discharge: Family;Available PRN/intermittently Type  of Home: House Home Access: Stairs to enter   Home Layout: One level Home Equipment: Kasandra Knudsen - single point;Toilet  riser      Prior Function Level of Independence: Independent      Comments: Worked with HHPT after R THA 04/2018, no longer needing RW or SPC   PT Goals (current goals can now be found in the care plan section) Acute Rehab PT Goals Patient Stated Goal: Go home today PT Goal Formulation: With patient Time For Goal Achievement: 11/30/18 Potential to Achieve Goals: Good Progress towards PT goals: Goals met/education completed, patient discharged from PT    Frequency    7X/week      PT Plan Current plan remains appropriate    Co-evaluation              AM-PAC PT "6 Clicks" Mobility   Outcome Measure  Help needed turning from your back to your side while in a flat bed without using bedrails?: None Help needed moving from lying on your back to sitting on the side of a flat bed without using bedrails?: None Help needed moving to and from a bed to a chair (including a wheelchair)?: None Help needed standing up from a chair using your arms (e.g., wheelchair or bedside chair)?: None Help needed to walk in hospital room?: None Help needed climbing 3-5 steps with a railing? : None 6 Click Score: 24    End of Session Equipment Utilized During Treatment: Gait belt Activity Tolerance: Patient tolerated treatment well Patient left: with nursing/sitter in room;with call bell/phone within reach(in bathroom with NT for set-up) Nurse Communication: Mobility status PT Visit Diagnosis: Other abnormalities of gait and mobility (R26.89);Pain Pain - Right/Left: Left Pain - part of body: Hip     Time: 0350-0938 PT Time Calculation (min) (ACUTE ONLY): 16 min  Charges:  $Gait Training: 8-22 mins                    Mabeline Caras, PT, DPT Acute Rehabilitation Services  Pager [phone] Office [place] 11/16/2018, 12:14 PM

## 2018-11-16 NOTE — Plan of Care (Signed)

## 2018-11-16 NOTE — TOC Transition Note (Addendum)
Transition of Care [facility]) - CM/SW Discharge Note   Patient Details  Name: REIN POPOV MRN: [id number] Date of Birth: 20-Jan-1961  [facility] of [facility] CM/SW Contact:  [place] Anna, LCSW Phone Number: 11/16/2018, 12:01 PM   Clinical Narrative:     Patient set to discharge home today- Per RN, Avon Gully, patient does not need Home Health at this time. Patient does have orders for DME 3n1 and a walker. Referral for DME completed with [employer]- patient and RN notified. DME equipment will be delivered to room.    Final next level of care: Home/Self Care Barriers to Discharge: No Barriers Identified   Patient Goals and CMS Choice   CMS Medicare.gov Compare Post Acute Care list provided to:: Patient Choice offered to / list presented to : Patient  Discharge Placement                       Discharge Plan and Services     Post Acute Care Choice: Durable Medical Equipment          DME Arranged: Berta Minor DME Agency: [facility] Date DME Agency Contacted: 11/16/18 Time DME Agency Contacted: 31 Representative spoke with at DME Agency: [place]: NA          Social Determinants of Health ([place]) Interventions     Readmission Risk Interventions No flowsheet data found.

## 2018-11-16 NOTE — Evaluation (Signed)
Physical Therapy Evaluation Patient Details Name: Joshua [name] MRN: [id number] DOB: 1960/04/30 Today's Date: 11/16/2018   History of Present Illness  Pt is a 58 y.o. male s/p elevtive L THA (direct anterior) 11/15/18. Of note, pt s/p recent R THA 04/2018. Other PMH includes CAD, HTN, pre-DM.    Clinical Impression  Pt presents with an overall decrease in functional mobility secondary to above. PTA, pt lives alone, has regained independent PLOF since R THA 04/2018. Reviewed precautions, positioning and importance of mobility. Today, pt ambulating well with RW at supervision-level. Pt with very poor attention requiring frequent cues to stay on task, required frequent repetition of education. Will plan for additional afternoon session to address therex and stair training, then feel pt will be safe to d/c home.    Follow Up Recommendations Follow surgeon's recommendation for DC plan and follow-up therapies;Supervision - Intermittent    Equipment Recommendations  Rolling walker with 5" wheels    Recommendations for Other Services       Precautions / Restrictions Precautions Precautions: Fall Restrictions Weight Bearing Restrictions: Yes LLE Weight Bearing: Weight bearing as tolerated      Mobility  Bed Mobility Overal bed mobility: Independent                Transfers Overall transfer level: Modified independent Equipment used: Rolling walker (2 wheeled)             General transfer comment: Able to perform repeated sit<>stands from recliner to RW; mod indep  Ambulation/Gait Ambulation/Gait assistance: Supervision Gait Distance (Feet): 120 Feet Assistive device: Rolling walker (2 wheeled) Gait Pattern/deviations: Step-through pattern;Decreased stride length Gait velocity: Decreased Gait velocity interpretation: 1.31 - 2.62 ft/sec, indicative of limited community ambulator General Gait Details: Slow, steady gait with RW, supervison for safety; intermittent cues to  keep RW on ground instead of picking up with steps. Intermittent standing rest breaks for hip marching/flex/abd  Stairs            Wheelchair Mobility    Modified Rankin (Stroke Patients Only)       Balance Overall balance assessment: Needs assistance   Sitting balance-Leahy Scale: Good       Standing balance-Leahy Scale: Fair Standing balance comment: Can static stand and take steps without UE support                             Pertinent Vitals/Pain Pain Assessment: 0-10 Pain Score: 8  Pain Descriptors / Indicators: Guarding;Discomfort Pain Intervention(s): Monitored during session;RN gave pain meds during session    Home Living Family/patient expects to be discharged to:: Private residence Living Arrangements: Alone Available Help at Discharge: Family;Available PRN/intermittently Type of Home: House Home Access: Stairs to enter   Entrance Stairs-Number of Steps: 1 Home Layout: One level Home Equipment: Cane - single point;Toilet riser      Prior Function Level of Independence: Independent         Comments: Worked with HHPT after R THA 04/2018, no longer needing RW or SPC     Hand Dominance        Extremity/Trunk Assessment   Upper Extremity Assessment Upper Extremity Assessment: Overall WFL for tasks assessed    Lower Extremity Assessment Lower Extremity Assessment: LLE deficits/detail LLE Deficits / Details: s/p L THA; functionally at least 3/5 throughout       Communication   Communication: No difficulties  Cognition Arousal/Alertness: Awake/alert Behavior During Therapy: WFL for tasks assessed/performed Overall Cognitive  Status: No family/caregiver present to determine baseline cognitive functioning Area of Impairment: Attention;Following commands                   Current Attention Level: Sustained   Following Commands: Follows multi-step commands inconsistently       General Comments: Pt with very poor  attention, difficulty multitasking, verbose with speech; likely baseline cognition      General Comments      Exercises Total Joint Exercises Hip ABduction/ADduction: AROM;Standing;Both Marching in Standing: AROM;Standing;Both Standing Hip Extension: AROM;Both;Standing   Assessment/Plan    PT Assessment Patient needs continued PT services  PT Problem List Decreased strength;Decreased range of motion;Decreased activity tolerance;Decreased balance;Decreased mobility;Decreased knowledge of precautions;Decreased knowledge of use of DME;Pain       PT Treatment Interventions DME instruction;Gait training;Stair training;Functional mobility training;Therapeutic activities;Therapeutic exercise;Balance training;Patient/family education    PT Goals (Current goals can be found in the Care Plan section)  Acute Rehab PT Goals Patient Stated Goal: Go home today PT Goal Formulation: With patient Time For Goal Achievement: 11/30/18 Potential to Achieve Goals: Good    Frequency 7X/week   Barriers to discharge Decreased caregiver support      Co-evaluation               AM-PAC PT "6 Clicks" Mobility  Outcome Measure Help needed turning from your back to your side while in a flat bed without using bedrails?: None Help needed moving from lying on your back to sitting on the side of a flat bed without using bedrails?: None Help needed moving to and from a bed to a chair (including a wheelchair)?: None Help needed standing up from a chair using your arms (e.g., wheelchair or bedside chair)?: None Help needed to walk in hospital room?: A Little Help needed climbing 3-5 steps with a railing? : A Little 6 Click Score: 22    End of Session Equipment Utilized During Treatment: Gait belt Activity Tolerance: Patient tolerated treatment well Patient left: in bed;with call bell/phone within reach;with nursing/sitter in room Nurse Communication: Mobility status PT Visit Diagnosis: Other  abnormalities of gait and mobility (R26.89);Pain Pain - Right/Left: Left Pain - part of body: Hip    Time: 0813-0830 PT Time Calculation (min) (ACUTE ONLY): 17 min   Charges:   PT Evaluation $PT Eval Moderate Complexity: [place], PT, DPT Acute Rehabilitation Services  Pager [phone] Office [place] 11/16/2018, 8:44 AM

## 2018-11-16 NOTE — Progress Notes (Signed)
Provided discharge education/instructions, all questions and concerns addressed, Pt not in distress, discharged home with belongings, BCS and RW.

## 2018-11-16 NOTE — Progress Notes (Signed)
Chart reviewed. B Lecretia Buczek RN,MHA,BSN Advanced Care Supervisor 

## 2018-11-16 NOTE — Discharge Summary (Signed)
Patient ID: Joshua [name] MRN: [id number] DOB/AGE: 12-12-1960 58 y.o.  Admit date: 11/15/2018 Discharge date: 11/16/2018  Admission Diagnoses:  Principal Problem:   Unilateral primary osteoarthritis, left hip Active Problems:   Status post total replacement of left hip   Discharge Diagnoses:  Same  Past Medical History:  Diagnosis Date  . Aortic stenosis   . CAD (coronary artery disease), native coronary artery 12/26/2016   DES LAD & RCA, EF 25-30%  . CHF (congestive heart failure) ([place])   . Dyslipidemia   . Hypertension   . Obesity   . Osteoarthritis   . Pre-diabetes    a. prior A1C 5.6, states he was on meds for this at one point.    Surgeries: Procedure(s): LEFT TOTAL HIP ARTHROPLASTY ANTERIOR APPROACH on 11/15/2018   Consultants:   Discharged Condition: Improved  Hospital Course: CASSEY HURRELL is an 58 y.o. male who was admitted 11/15/2018 for operative treatment ofUnilateral primary osteoarthritis, left hip. Patient has severe unremitting pain that affects sleep, daily activities, and work/hobbies. After pre-op clearance the patient was taken to the operating room on 11/15/2018 and underwent  Procedure(s): LEFT TOTAL HIP ARTHROPLASTY ANTERIOR APPROACH.    Patient was given perioperative antibiotics:  Anti-infectives (From admission, onward)   Start     Dose/Rate Route Frequency Ordered Stop   11/15/18 1630  ceFAZolin (ANCEF) IVPB 2g/100 mL premix     2 g 200 mL/hr over 30 Minutes Intravenous Every 6 hours 11/15/18 1532 11/16/18 0546   11/15/18 1053  vancomycin (VANCOCIN) powder  Status:  Discontinued       As needed 11/15/18 1053 11/15/18 1156   11/15/18 1000  ceFAZolin (ANCEF) IVPB 2g/100 mL premix     2 g 200 mL/hr over 30 Minutes Intravenous On call to O.R. 11/15/18 0827 11/15/18 1047   11/15/18 0833  ceFAZolin (ANCEF) 2-4 GM/100ML-% IVPB    Note to Pharmacy: Tamsen Snider   : cabinet override      11/15/18 0833 11/15/18 1017       Patient was  given sequential compression devices, early ambulation, and chemoprophylaxis to prevent DVT.  Patient benefited maximally from hospital stay and there were no complications.    Recent vital signs:  Patient Vitals for the past 24 hrs:  BP Temp Temp src Pulse Resp SpO2 Height Weight  11/16/18 0446 115/68 97.6 F (36.4 C) Oral 62 16 98 % - -  11/16/18 0009 116/80 97.8 F (36.6 C) Oral 63 16 97 % - -  11/15/18 1941 115/63 97.7 F (36.5 C) Oral (!) 59 16 98 % - -  11/15/18 1535 120/71 98 F (36.7 C) Oral 60 16 99 % - -  11/15/18 1500 117/62 - - (!) 59 14 98 % - -  11/15/18 1445 112/68 - - (!) 56 16 98 % - -  11/15/18 1430 135/75 - - (!) 53 (!) 8 99 % - -  11/15/18 1415 (!) 180/82 - - (!) 57 10 100 % - -  11/15/18 1400 (!) 169/88 - - (!) 53 12 100 % - -  11/15/18 1345 (!) 155/85 - - (!) 59 16 99 % - -  11/15/18 1335 (!) 119/54 - - (!) 55 14 100 % - -  11/15/18 1330 - - - (!) 55 11 98 % - -  11/15/18 1315 (!) 121/56 97.6 F (36.4 C) - (!) 56 16 98 % - -  11/15/18 1314 (!) 88/60 - - (!) 54 14 99 % - -  11/15/18 1300 (!) 91/56 - - (!) 50 16 100 % - -  11/15/18 1245 - - - (!) 51 10 98 % - -  11/15/18 1230 (!) 79/53 - - (!) 54 (!) 8 98 % - -  11/15/18 1215 (!) 82/59 - - (!) 55 10 100 % - -  11/15/18 1205 (!) 83/51 - - (!) 58 (!) 7 100 % - -  11/15/18 1200 (!) 71/49 97.6 F (36.4 C) - (!) 53 11 100 % - -  11/15/18 0848 - - - - - - 6\' 1"  (1.854 m) 100.4 kg  11/15/18 0836 (!) 106/57 98.3 F (36.8 C) - 61 18 94 % - -     Recent laboratory studies:  Recent Labs    11/16/18 0435  WBC 11.3*  HGB 12.2*  HCT 35.6*  PLT 129*  NA 140  K 3.9  CL 106  CO2 25  BUN 11  CREATININE 0.67  GLUCOSE 123*  CALCIUM 8.4*     Discharge Medications:   Allergies as of 11/16/2018   No Known Allergies     Medication List    STOP taking these medications   predniSONE 10 MG (21) Tbpk tablet Commonly known as: STERAPRED UNI-PAK 21 TAB     TAKE these medications   aspirin EC 81 MG  tablet Take 1 tablet (81 mg total) by mouth 2 (two) times daily. What changed: Another medication with the same name was added. Make sure you understand how and when to take each.   aspirin EC 81 MG tablet Take 1 tablet (81 mg total) by mouth 2 (two) times daily. What changed: You were already taking a medication with the same name, and this prescription was added. Make sure you understand how and when to take each.   carvedilol 3.125 MG tablet Commonly known as: COREG Take 1 tablet (3.125 mg total) by mouth 2 (two) times daily.   lidocaine 5 % Commonly known as: LIDODERM PLACE 1 PATCH ONTO THE SKIN DAILY. REMOVE & DISCARD PATCH WITHIN 12 HOURS OR AS DIRECTED BY DOCTOR. What changed: See the new instructions.   lisinopril 10 MG tablet Commonly known as: ZESTRIL Take 1 tablet (10 mg total) by mouth 2 (two) times daily. Needs appt for more refills.   methocarbamol 500 MG tablet Commonly known as: Robaxin Take 1 tablet (500 mg total) by mouth 2 (two) times daily as needed for muscle spasms. What changed: Another medication with the same name was added. Make sure you understand how and when to take each.   methocarbamol 750 MG tablet Commonly known as: ROBAXIN Take 1 tablet (750 mg total) by mouth 2 (two) times daily as needed for muscle spasms. What changed: You were already taking a medication with the same name, and this prescription was added. Make sure you understand how and when to take each.   ondansetron 4 MG tablet Commonly known as: ZOFRAN Take 1-2 tablets (4-8 mg total) by mouth every 8 (eight) hours as needed for nausea or vomiting.   oxyCODONE 10 mg 12 hr tablet Commonly known as: OXYCONTIN Take 1 tablet (10 mg total) by mouth every 12 (twelve) hours for 3 days.   oxyCODONE-acetaminophen 5-325 MG tablet Commonly known as: Percocet Take 1-2 tablets by mouth every 8 (eight) hours as needed for severe pain.   potassium chloride 10 MEQ tablet Commonly known as:  K-DUR Take 10 mEq by mouth daily.   promethazine 25 MG tablet Commonly known as: PHENERGAN Take 1  tablet (25 mg total) by mouth every 6 (six) hours as needed for nausea.   senna-docusate 8.6-50 MG tablet Commonly known as: Senokot S Take 1 tablet by mouth at bedtime as needed. What changed: Another medication with the same name was added. Make sure you understand how and when to take each.   senna-docusate 8.6-50 MG tablet Commonly known as: Senokot S Take 1-2 tablets by mouth at bedtime as needed. What changed: You were already taking a medication with the same name, and this prescription was added. Make sure you understand how and when to take each.            Durable Medical Equipment  (From admission, onward)         Start     Ordered   11/15/18 1533  DME Walker rolling  Once    Question:  Patient needs a walker to treat with the following condition  Answer:  History of hip replacement   11/15/18 1532   11/15/18 1533  DME 3 n 1  Once     11/15/18 1532   11/15/18 1533  DME Bedside commode  Once    Question:  Patient needs a bedside commode to treat with the following condition  Answer:  History of hip replacement   11/15/18 1532          Diagnostic Studies: Dg Pelvis Portable  Result Date: 11/15/2018 CLINICAL DATA:  Hip joint replacement. EXAM: PORTABLE PELVIS 1-2 VIEWS COMPARISON:  10/28/2018 FINDINGS: Interval placement of a left hip arthroplasty. The left hip appears located on this single view. The entire left femoral stem is visualized. Again noted is a right hip arthroplasty. Visualized pelvic bony structures are intact. IMPRESSION: Placement of left hip arthroplasty without complicating features. Electronically Signed   By: Markus Daft M.D.   On: 11/15/2018 12:55   Dg C-arm 1-60 Min  Result Date: 11/15/2018 CLINICAL DATA:  LEFT anterior total hip arthroplasty EXAM: OPERATIVE LEFT HIP (WITH PELVIS IF PERFORMED) 2 VIEWS TECHNIQUE: Fluoroscopic spot image(s) were  submitted for interpretation post-operatively. COMPARISON:  10/28/2018 FLUOROSCOPY TIME:  0 minutes 39 seconds Dose: 6.5062 mGy FINDINGS: LEFT hip prosthesis identified in expected position. No fracture or dislocation. Bones appear demineralized. RIGHT hip prosthesis noted. IMPRESSION: LEFT hip prosthesis without acute complication. Electronically Signed   By: Lavonia Dana M.D.   On: 11/15/2018 11:36   Dg Hip Operative Unilat W Or W/o Pelvis Left  Result Date: 11/15/2018 CLINICAL DATA:  LEFT anterior total hip arthroplasty EXAM: OPERATIVE LEFT HIP (WITH PELVIS IF PERFORMED) 2 VIEWS TECHNIQUE: Fluoroscopic spot image(s) were submitted for interpretation post-operatively. COMPARISON:  10/28/2018 FLUOROSCOPY TIME:  0 minutes 39 seconds Dose: 6.5062 mGy FINDINGS: LEFT hip prosthesis identified in expected position. No fracture or dislocation. Bones appear demineralized. RIGHT hip prosthesis noted. IMPRESSION: LEFT hip prosthesis without acute complication. Electronically Signed   By: Lavonia Dana M.D.   On: 11/15/2018 11:36   Xr Hip Unilat W Or W/o Pelvis 1v Right  Result Date: 10/28/2018 X-rays of the right hip demonstrate a well-seated prosthesis without complication.  Left hip shows marked degenerative changes    Disposition: Discharge disposition: 01-Home or Self Care         Follow-up Information    Leandrew Koyanagi, MD In 2 weeks.   Specialty: Orthopedic Surgery Why: For suture removal, For wound re-check Contact information: [place] [id number] [phone]            Signed: Trinidad Curet  Venida Jarvis 11/16/2018, 7:59 AM

## 2018-11-16 NOTE — Progress Notes (Addendum)
Subjective: 1 Day Post-Op Procedure(s) (LRB): LEFT TOTAL HIP ARTHROPLASTY ANTERIOR APPROACH (Left) Patient reports pain as mild.    Objective: Vital signs in last 24 hours: Temp:  [97.6 F (36.4 C)-98.3 F (36.8 C)] 97.6 F (36.4 C) (08/04 0446) Pulse Rate:  [50-63] 62 (08/04 0446) Resp:  [7-18] 16 (08/04 0446) BP: (71-180)/(49-88) 115/68 (08/04 0446) SpO2:  [94 %-100 %] 98 % (08/04 0446) Weight:  [100.4 kg] 100.4 kg (08/03 0848)  Intake/Output from previous day: 08/03 0701 - 08/04 0700 In: 1350 [I.V.:1100; IV Piggyback:250] Out: 7711 [Urine:4400; Blood:250] Intake/Output this shift: Total I/O In: -  Out: 6579 [Urine:1650]  Recent Labs    11/16/18 0435  HGB 12.2*   Recent Labs    11/16/18 0435  WBC 11.3*  RBC 3.84*  HCT 35.6*  PLT 129*   Recent Labs    11/16/18 0435  NA 140  K 3.9  CL 106  CO2 25  BUN 11  CREATININE 0.67  GLUCOSE 123*  CALCIUM 8.4*   No results for input(s): LABPT, INR in the last 72 hours.  Neurologically intact Neurovascular intact Sensation intact distally Intact pulses distally Dorsiflexion/Plantar flexion intact Incision: scant drainage No cellulitis present Compartment soft   Assessment/Plan: 1 Day Post-Op Procedure(s) (LRB): LEFT TOTAL HIP ARTHROPLASTY ANTERIOR APPROACH (Left) Advance diet Up with therapy D/C IV fluids Discharge home with home health after second PT session WBAT LLE ABLA-mild and stable D/c foley F/u with Dr. Erlinda Hong 2 weeks post-op      Aundra Dubin 11/16/2018, 7:57 AM

## 2018-11-17 ENCOUNTER — Telehealth: Payer: Self-pay

## 2018-11-17 NOTE — Telephone Encounter (Signed)
Joshua [name] with [facility] called to let you know the HHPT that was ordered at discharged was cancelled by hospital therapist due to patients progress prior to discharge.

## 2018-11-17 NOTE — Telephone Encounter (Signed)
Spoke to [employer]

## 2018-11-18 NOTE — Telephone Encounter (Signed)
noted 

## 2018-11-22 ENCOUNTER — Emergency Department (HOSPITAL_COMMUNITY): Payer: Medicaid Other

## 2018-11-22 ENCOUNTER — Other Ambulatory Visit: Payer: Self-pay

## 2018-11-22 ENCOUNTER — Telehealth: Payer: Self-pay

## 2018-11-22 ENCOUNTER — Emergency Department (HOSPITAL_COMMUNITY)
Admission: EM | Admit: 2018-11-22 | Discharge: 2018-11-22 | Disposition: A | Discharge status: Home / Self Care | Payer: Medicaid Other | Attending: Emergency Medicine | Admitting: Emergency Medicine

## 2018-11-22 ENCOUNTER — Encounter (HOSPITAL_COMMUNITY): Payer: Self-pay | Admitting: *Deleted

## 2018-11-22 DIAGNOSIS — Z7982 Long term (current) use of aspirin: Secondary | ICD-10-CM | POA: Diagnosis not present

## 2018-11-22 DIAGNOSIS — Z5189 Encounter for other specified aftercare: Secondary | ICD-10-CM | POA: Insufficient documentation

## 2018-11-22 DIAGNOSIS — Z96641 Presence of right artificial hip joint: Secondary | ICD-10-CM | POA: Diagnosis not present

## 2018-11-22 DIAGNOSIS — Z96642 Presence of left artificial hip joint: Secondary | ICD-10-CM | POA: Insufficient documentation

## 2018-11-22 DIAGNOSIS — Z4889 Encounter for other specified surgical aftercare: Secondary | ICD-10-CM | POA: Diagnosis not present

## 2018-11-22 DIAGNOSIS — T8149XA Infection following a procedure, other surgical site, initial encounter: Secondary | ICD-10-CM | POA: Diagnosis not present

## 2018-11-22 DIAGNOSIS — Z87891 Personal history of nicotine dependence: Secondary | ICD-10-CM | POA: Diagnosis not present

## 2018-11-22 DIAGNOSIS — Z471 Aftercare following joint replacement surgery: Secondary | ICD-10-CM | POA: Diagnosis not present

## 2018-11-22 DIAGNOSIS — I2510 Atherosclerotic heart disease of native coronary artery without angina pectoris: Secondary | ICD-10-CM | POA: Diagnosis not present

## 2018-11-22 DIAGNOSIS — T819XXA Unspecified complication of procedure, initial encounter: Secondary | ICD-10-CM | POA: Diagnosis present

## 2018-11-22 DIAGNOSIS — F1210 Cannabis abuse, uncomplicated: Secondary | ICD-10-CM | POA: Insufficient documentation

## 2018-11-22 DIAGNOSIS — T8189XA Other complications of procedures, not elsewhere classified, initial encounter: Secondary | ICD-10-CM | POA: Diagnosis not present

## 2018-11-22 DIAGNOSIS — I10 Essential (primary) hypertension: Secondary | ICD-10-CM | POA: Diagnosis not present

## 2018-11-22 DIAGNOSIS — Y69 Unspecified misadventure during surgical and medical care: Secondary | ICD-10-CM | POA: Diagnosis not present

## 2018-11-22 DIAGNOSIS — Z79899 Other long term (current) drug therapy: Secondary | ICD-10-CM | POA: Diagnosis not present

## 2018-11-22 LAB — COMPREHENSIVE METABOLIC PANEL
ALT: 69 U/L — ABNORMAL HIGH (ref 0–44)
AST: 24 U/L (ref 15–41)
Albumin: 3.2 g/dL — ABNORMAL LOW (ref 3.5–5.0)
Alkaline Phosphatase: 164 U/L — ABNORMAL HIGH (ref 38–126)
Anion gap: 10 (ref 5–15)
BUN: 16 mg/dL (ref 6–20)
CO2: 24 mmol/L (ref 22–32)
Calcium: 8.8 mg/dL — ABNORMAL LOW (ref 8.9–10.3)
Chloride: 102 mmol/L (ref 98–111)
Creatinine, Ser: 0.76 mg/dL (ref 0.61–1.24)
GFR calc Af Amer: >60 mL/min (ref >60)
GFR calc non Af Amer: >60 mL/min (ref >60)
Glucose, Bld: 118 mg/dL — ABNORMAL HIGH (ref 70–99)
Potassium: 4.2 mmol/L (ref 3.5–5.1)
Sodium: 136 mmol/L (ref 135–145)
Total Bilirubin: 0.8 mg/dL (ref 0.3–1.2)
Total Protein: 6.1 g/dL — ABNORMAL LOW (ref 6.5–8.1)

## 2018-11-22 LAB — URINALYSIS, ROUTINE W REFLEX MICROSCOPIC
Bilirubin Urine: NEGATIVE
Glucose, UA: NEGATIVE mg/dL
Hgb urine dipstick: NEGATIVE
Ketones, ur: NEGATIVE mg/dL
Leukocytes,Ua: NEGATIVE
Nitrite: NEGATIVE
Protein, ur: NEGATIVE mg/dL
Specific Gravity, Urine: 1.002 — ABNORMAL LOW (ref 1.005–1.030)
pH: 6 (ref 5.0–8.0)

## 2018-11-22 LAB — CBC WITH DIFFERENTIAL/PLATELET
Abs Immature Granulocytes: 0.04 10*3/uL (ref 0.00–0.07)
Basophils Absolute: 0 10*3/uL (ref 0.0–0.1)
Basophils Relative: 0 %
Eosinophils Absolute: 0.5 10*3/uL (ref 0.0–0.5)
Eosinophils Relative: 7 %
HCT: 39.9 % (ref 39.0–52.0)
Hemoglobin: 13.3 g/dL (ref 13.0–17.0)
Immature Granulocytes: 1 %
Lymphocytes Relative: 25 %
Lymphs Abs: 1.9 10*3/uL (ref 0.7–4.0)
MCH: 31.7 pg (ref 26.0–34.0)
MCHC: 33.3 g/dL (ref 30.0–36.0)
MCV: 95 fL (ref 80.0–100.0)
Monocytes Absolute: 0.6 10*3/uL (ref 0.1–1.0)
Monocytes Relative: 8 %
Neutro Abs: 4.5 10*3/uL (ref 1.7–7.7)
Neutrophils Relative %: 59 %
Platelets: 204 10*3/uL (ref 150–400)
RBC: 4.2 MIL/uL — ABNORMAL LOW (ref 4.22–5.81)
RDW: 14 % (ref 11.5–15.5)
WBC: 7.7 10*3/uL (ref 4.0–10.5)
nRBC: 0 % (ref 0.0–0.2)

## 2018-11-22 LAB — LACTIC ACID, PLASMA: Lactic Acid, Venous: 1.3 mmol/L (ref 0.5–1.9)

## 2018-11-22 MED ORDER — SODIUM CHLORIDE 0.9% FLUSH: 3.0000 mL | Freq: Once | INTRAVENOUS | Status: DC

## 2018-11-22 NOTE — Discharge Instructions (Addendum)
Follow-up with Dr. Erlinda Hong this week.  Return here for fever, redness to your incision, or any other problems

## 2018-11-22 NOTE — ED Triage Notes (Signed)
Pt states L hip replacement last Monday.  Noted pus draining 2 days ago.

## 2018-11-22 NOTE — Telephone Encounter (Signed)
Patient called concerning left hip.  Patient had Left total hip on Monday, 11/15/2018 with Dr. Erlinda Hong.  Patient advised that he is currently at [facility] ED.  Advised patient that Dr. Erlinda Hong is not in the office today due to being in surgery.  Patient voiced that he understands.

## 2018-11-22 NOTE — ED Provider Notes (Signed)
[facility] EMERGENCY DEPARTMENT Provider Note   CSN: [id number] Arrival date & time: 11/22/18  1429     History   Chief Complaint Chief Complaint  Patient presents with  . Post-op Problem    HPI Joshua [name] is a 58 y.o. male.     58 year old male who recently had hip surgery a week ago presents with concern for possible postoperative infection.  Denies any fever or chills.  Has some drainage from his left hip incision he was concerned about.  Denies any distal numbness or tingling to his left foot.  No cough or congestion.  No treatment used prior to arrival     Past Medical History:  Diagnosis Date  . Aortic stenosis   . CAD (coronary artery disease), native coronary artery 12/26/2016   DES LAD & RCA, EF 25-30%  . CHF (congestive heart failure) ([place])   . Dyslipidemia   . Hypertension   . Obesity   . Osteoarthritis   . Pre-diabetes    a. prior A1C 5.6, states he was on meds for this at one point.    Patient Active Problem List   Diagnosis Date Noted  . 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/15/2018  . Chronic left shoulder pain 06/08/2018  . Unilateral primary osteoarthritis, left hip 06/08/2018  . Status post total hip replacement, right 04/26/2018  . Mild dilation of ascending aorta ([facility]) 12/26/2017  . Dyslipidemia 12/26/2017  . Bilateral carotid bruits 12/26/2017  . Right knee pain 07/23/2017  . Pain in right hip 07/23/2017  . Nocturnal hypoxemia 06/29/2017  . Primary osteoarthritis of both hips 06/29/2017  . Chronic diastolic heart failure ([place]) 04/27/2017  . Primary osteoarthritis of right hip 03/31/2017  . Coronary artery disease involving native coronary artery of native heart without angina pectoris 03/31/2017  . Ischemic cardiomyopathy 03/31/2017  . Marijuana use 03/31/2017  . Prediabetes 03/31/2017  . Aortic valve insufficiency 12/28/2016  . Mitral valve regurgitation 12/28/2016  . Obesity 12/28/2016  . Aortic  valve stenosis, nonrheumatic   . Combined systolic and diastolic ACC/AHA stage C congestive heart failure ([place])   . Essential hypertension     Past Surgical History:  Procedure Laterality Date  . CORONARY STENT INTERVENTION N/A 12/29/2016   Procedure: CORONARY STENT INTERVENTION;  Surgeon: Jettie Booze, MD;  Location: [facility] CV LAB;  Service: Cardiovascular;  Laterality: N/A;  . RIGHT/LEFT HEART CATH AND CORONARY ANGIOGRAPHY N/A 12/29/2016   Procedure: RIGHT/LEFT HEART CATH AND CORONARY ANGIOGRAPHY;  Surgeon: Jettie Booze, MD;  Location: [place] [facility];  Service: Cardiovascular;  Laterality: N/A;  . TONSILLECTOMY    . TOTAL HIP ARTHROPLASTY Right 04/26/2018   Procedure: RIGHT TOTAL HIP ARTHROPLASTY ANTERIOR APPROACH;  Surgeon: Leandrew Koyanagi, MD;  Location: [place];  Service: Orthopedics;  Laterality: Right;  . TOTAL HIP ARTHROPLASTY Left 11/15/2018  . TOTAL HIP ARTHROPLASTY Left 11/15/2018   Procedure: LEFT TOTAL HIP ARTHROPLASTY ANTERIOR APPROACH;  Surgeon: Leandrew Koyanagi, MD;  Location: [place];  Service: Orthopedics;  Laterality: Left;        Home Medications    Prior to Admission medications   Medication Sig Start Date End Date Taking? Authorizing Provider  aspirin EC 81 MG tablet Take 1 tablet (81 mg total) by mouth 2 (two) times daily. 04/26/18   Leandrew Koyanagi, MD  aspirin EC 81 MG tablet Take 1 tablet (81 mg total) by mouth 2 (two) times daily. 11/15/18   Leandrew Koyanagi,  MD  carvedilol (COREG) 3.125 MG tablet Take 1 tablet (3.125 mg total) by mouth 2 (two) times daily. 08/25/18 08/20/19  Kerin Perna, NP  lidocaine (LIDODERM) 5 % PLACE 1 PATCH ONTO THE SKIN DAILY. REMOVE & DISCARD PATCH WITHIN 12 HOURS OR AS DIRECTED BY DOCTOR. Patient taking differently: Place 1-2 patches onto the skin daily.  08/30/18   Ladell Pier, MD  lisinopril (ZESTRIL) 10 MG tablet Take 1 tablet (10 mg total) by mouth 2 (two) times daily. Needs appt for more refills. 10/28/18    Ladell Pier, MD  methocarbamol (ROBAXIN) 500 MG tablet Take 1 tablet (500 mg total) by mouth 2 (two) times daily as needed for muscle spasms. 10/28/18   Aundra Dubin, PA-C  methocarbamol (ROBAXIN) 750 MG tablet Take 1 tablet (750 mg total) by mouth 2 (two) times daily as needed for muscle spasms. 11/15/18   Leandrew Koyanagi, MD  ondansetron (ZOFRAN) 4 MG tablet Take 1-2 tablets (4-8 mg total) by mouth every 8 (eight) hours as needed for nausea or vomiting. 11/15/18   Leandrew Koyanagi, MD  oxyCODONE-acetaminophen (PERCOCET) 5-325 MG tablet Take 1-2 tablets by mouth every 8 (eight) hours as needed for severe pain. 11/15/18   Leandrew Koyanagi, MD  potassium chloride (K-DUR) 10 MEQ tablet Take 10 mEq by mouth daily.    [provider]  promethazine (PHENERGAN) 25 MG tablet Take 1 tablet (25 mg total) by mouth every 6 (six) hours as needed for nausea. 11/15/18   Leandrew Koyanagi, MD  senna-docusate (SENOKOT S) 8.6-50 MG tablet Take 1 tablet by mouth at bedtime as needed. Patient not taking: Reported on 11/10/2018 04/26/18   Leandrew Koyanagi, MD  senna-docusate (SENOKOT S) 8.6-50 MG tablet Take 1-2 tablets by mouth at bedtime as needed. 11/15/18   Leandrew Koyanagi, MD    Family History Family History  Problem Relation Age of Onset  . CAD Father        MI/CABG age 39    Social History Social History   Tobacco Use  . Smoking status: Former Research scientist (life sciences)  . Smokeless tobacco: Never Used  . Tobacco comment: Smoked lightly for approx 20 years, quit 1990s  Substance Use Topics  . Alcohol use: Yes    Comment: 1-2 mixed drinks per week  . Drug use: Yes    Types: Marijuana    Comment: occasional     Allergies   Patient has no known allergies.   Review of Systems Review of Systems  All other systems reviewed and are negative.    Physical Exam Updated Vital Signs BP (!) 117/53   Pulse 67   Temp 98.4 F (36.9 C) (Oral)   Resp 18   SpO2 100%   Physical Exam Vitals signs and nursing note reviewed.   Constitutional:      General: He is not in acute distress.    Appearance: Normal appearance. He is well-developed. He is not toxic-appearing.  HENT:     Head: Normocephalic and atraumatic.  Eyes:     General: Lids are normal.     Conjunctiva/sclera: Conjunctivae normal.     Pupils: Pupils are equal, round, and reactive to light.  Neck:     Musculoskeletal: Normal range of motion and neck supple.     Thyroid: No thyroid mass.     Trachea: No tracheal deviation.  Cardiovascular:     Rate and Rhythm: Normal rate and regular rhythm.     Heart sounds: Normal  heart sounds. No murmur. No gallop.   Pulmonary:     Effort: Pulmonary effort is normal. No respiratory distress.     Breath sounds: Normal breath sounds. No stridor. No decreased breath sounds, wheezing, rhonchi or rales.  Abdominal:     General: Bowel sounds are normal. There is no distension.     Palpations: Abdomen is soft.     Tenderness: There is no abdominal tenderness. There is no rebound.  Musculoskeletal: Normal range of motion.        General: No tenderness.       Legs:  Skin:    General: Skin is warm and dry.     Findings: No abrasion or rash.  Neurological:     Mental Status: He is alert and oriented to person, place, and time.     GCS: GCS eye subscore is 4. GCS verbal subscore is 5. GCS motor subscore is 6.     Cranial Nerves: No cranial nerve deficit.     Sensory: No sensory deficit.  Psychiatric:        Speech: Speech normal.        Behavior: Behavior normal.      ED Treatments / Results  Labs (all labs ordered are listed, but only abnormal results are displayed) Labs Reviewed  COMPREHENSIVE METABOLIC PANEL - Abnormal; Notable for the following components:      Result Value   Glucose, Bld 118 (*)    Calcium 8.8 (*)    Total Protein 6.1 (*)    Albumin 3.2 (*)    ALT 69 (*)    Alkaline Phosphatase 164 (*)    All other components within normal limits  CBC WITH DIFFERENTIAL/PLATELET - Abnormal;  Notable for the following components:   RBC 4.20 (*)    All other components within normal limits  URINALYSIS, ROUTINE W REFLEX MICROSCOPIC - Abnormal; Notable for the following components:   Color, Urine COLORLESS (*)    Specific Gravity, Urine 1.002 (*)    All other components within normal limits  LACTIC ACID, PLASMA    EKG None  Radiology Dg Chest 2 View  Result Date: 11/22/2018 CLINICAL DATA:  Hypertension.  Recent total hip replacement EXAM: CHEST - 2 VIEW COMPARISON:  April 23, 2018 FINDINGS: Lungs are clear. The heart size and pulmonary vascularity are normal. No adenopathy. There is aortic atherosclerosis. No bone lesions. IMPRESSION: No edema or consolidation. Heart size normal. Aortic Atherosclerosis (ICD10-I70.0). Electronically Signed   By: Lowella Grip III M.D.   On: 11/22/2018 16:10   Dg Hip Unilat With Pelvis 2-3 Views Left  Result Date: 11/22/2018 CLINICAL DATA:  Pain an apparent drainage following total hip replacement EXAM: DG HIP (WITH OR WITHOUT PELVIS) 2-3V LEFT COMPARISON:  Intraoperative left hip image November 15, 2018 FINDINGS: Frontal pelvis as well as frontal and lateral images of the left hip obtained. There are total hip replacements with prosthetic components bilaterally well-seated. No fracture or dislocation. No appreciable arthropathy or erosion. No soft tissue air. IMPRESSION: Bilateral total hip replacements with prosthetic components well-seated. No fracture or dislocation. No erosive change or soft tissue air evident. Electronically Signed   By: Lowella Grip III M.D.   On: 11/22/2018 16:11    Procedures Procedures (including critical care time)  Medications Ordered in ED Medications  sodium chloride flush (NS) 0.9 % injection 3 mL (has no administration in time range)     Initial Impression / Assessment and Plan / ED Course  I have reviewed the  triage vital signs and the nursing notes.  Pertinent labs & imaging results that were  available during my care of the patient were reviewed by me and considered in my medical decision making (see chart for details).        Patient's labs are reassuring here.  Postoperative incision without signs of infection.  Return precautions given  Final Clinical Impressions(s) / ED Diagnoses   Final diagnoses:  None    ED Discharge Orders    None       Lacretia Leigh, MD 11/22/18 2130

## 2018-11-25 ENCOUNTER — Telehealth: Payer: Self-pay | Admitting: *Deleted

## 2018-11-25 NOTE — Telephone Encounter (Signed)
Pt called concerned about his wound/incision from L hip replacement and states that it draining and was concerned that it could be infected, I went over the infection protocol and pt states it is not warm to touch, not swollen or red, drainage is clear. I told pt he there did not seem to be any infection and that he needed to change drressing as instructed and if he saw any signs of infection that we went over to contact office and we will work him in. Pt voiced understanding and stated he has appt for follow up on Tues.

## 2018-11-28 DIAGNOSIS — H5213 Myopia, bilateral: Secondary | ICD-10-CM | POA: Diagnosis not present

## 2018-11-29 ENCOUNTER — Other Ambulatory Visit: Payer: Self-pay | Admitting: Internal Medicine

## 2018-11-29 DIAGNOSIS — I10 Essential (primary) hypertension: Secondary | ICD-10-CM

## 2018-11-29 MED FILL — LISINOPRIL 10 MG TABS: 10 | 30 days supply | Qty: 60 | Fill #0

## 2018-11-30 ENCOUNTER — Ambulatory Visit (INDEPENDENT_AMBULATORY_CARE_PROVIDER_SITE_OTHER): Payer: Medicaid Other | Admitting: Orthopaedic Surgery

## 2018-11-30 ENCOUNTER — Encounter: Payer: Self-pay | Admitting: Orthopaedic Surgery

## 2018-11-30 DIAGNOSIS — Z96642 Presence of left artificial hip joint: Secondary | ICD-10-CM

## 2018-11-30 MED ORDER — SULFAMETHOXAZOLE-TRIMETHOPRIM 800-160 MG PO TABS: 1.0000 | ORAL_TABLET | Freq: Two times a day (BID) | ORAL | 0 refills | Status: DC

## 2018-11-30 MED ORDER — MUPIROCIN 2 % EX OINT: 1.0000 "application " | TOPICAL_OINTMENT | Freq: Two times a day (BID) | CUTANEOUS | 0 refills | Status: DC

## 2018-11-30 MED FILL — SULFAMETHOXAZOLE-TMP DS TAB: 800-160 | 10 days supply | Qty: 20 | Fill #0

## 2018-11-30 MED FILL — MUPIROCIN 2% OINTMENT: 2 | 10 days supply | Qty: 22 | Fill #0

## 2018-11-30 MED FILL — CARVEDILOL 3.125 MG TABLET: 3.125 | 90 days supply | Qty: 180 | Fill #0

## 2018-11-30 NOTE — Progress Notes (Signed)
Patient ID: [name], male   DOB: 21-May-1960, 58 y.o.   MRN: [id number]  Joshua [name] is two-week status post left total hip replacement.  He is doing well overall.  He is walking well without any assistance.  He did have some scant drainage from the incision he went to the ED a couple days ago but this was evaluated by the ED doctor and was normal.  They obtained x-rays which were unremarkable. Today on exam his incision is clean dry intact.  Couple areas with a dry scab.  No evidence of infection.  He has a very small seroma and swelling around the incision. I applied Bactroban ointment to the incision with a Band-Aid.  He is to do this twice a day.  He is to continue with the icing.  I would like to put him on bactrim for 10 days prophylactically for the seroma.  We will recheck him in 4 weeks with standing AP pelvis x-rays

## 2018-12-01 ENCOUNTER — Other Ambulatory Visit: Payer: Self-pay

## 2018-12-01 ENCOUNTER — Emergency Department (HOSPITAL_COMMUNITY): Payer: Medicaid Other

## 2018-12-01 ENCOUNTER — Emergency Department (HOSPITAL_COMMUNITY)
Admission: EM | Admit: 2018-12-01 | Discharge: 2018-12-01 | Disposition: A | Discharge status: Home / Self Care | Payer: Medicaid Other | Attending: Emergency Medicine | Admitting: Emergency Medicine

## 2018-12-01 DIAGNOSIS — Y929 Unspecified place or not applicable: Secondary | ICD-10-CM | POA: Insufficient documentation

## 2018-12-01 DIAGNOSIS — I2510 Atherosclerotic heart disease of native coronary artery without angina pectoris: Secondary | ICD-10-CM | POA: Insufficient documentation

## 2018-12-01 DIAGNOSIS — Z79899 Other long term (current) drug therapy: Secondary | ICD-10-CM | POA: Diagnosis not present

## 2018-12-01 DIAGNOSIS — I110 Hypertensive heart disease with heart failure: Secondary | ICD-10-CM | POA: Diagnosis not present

## 2018-12-01 DIAGNOSIS — Z96643 Presence of artificial hip joint, bilateral: Secondary | ICD-10-CM | POA: Diagnosis not present

## 2018-12-01 DIAGNOSIS — Z87891 Personal history of nicotine dependence: Secondary | ICD-10-CM | POA: Diagnosis not present

## 2018-12-01 DIAGNOSIS — Y999 Unspecified external cause status: Secondary | ICD-10-CM | POA: Insufficient documentation

## 2018-12-01 DIAGNOSIS — I5032 Chronic diastolic (congestive) heart failure: Secondary | ICD-10-CM | POA: Insufficient documentation

## 2018-12-01 DIAGNOSIS — Z7982 Long term (current) use of aspirin: Secondary | ICD-10-CM | POA: Insufficient documentation

## 2018-12-01 DIAGNOSIS — Y9389 Activity, other specified: Secondary | ICD-10-CM | POA: Insufficient documentation

## 2018-12-01 DIAGNOSIS — S01511A Laceration without foreign body of lip, initial encounter: Secondary | ICD-10-CM | POA: Diagnosis not present

## 2018-12-01 DIAGNOSIS — S79912A Unspecified injury of left hip, initial encounter: Secondary | ICD-10-CM | POA: Diagnosis not present

## 2018-12-01 DIAGNOSIS — M25552 Pain in left hip: Secondary | ICD-10-CM

## 2018-12-01 DIAGNOSIS — W228XXA Striking against or struck by other objects, initial encounter: Secondary | ICD-10-CM | POA: Insufficient documentation

## 2018-12-01 MED ORDER — LIDOCAINE HCL 2 % IJ SOLN
20.0000 mL | Freq: Once | INTRAMUSCULAR | Status: AC
  Administered 2018-12-01: 400 mg via INTRADERMAL
  Filled 2018-12-01: qty 20

## 2018-12-01 MED ORDER — OXYCODONE-ACETAMINOPHEN 5-325 MG PO TABS
1.0000 | ORAL_TABLET | Freq: Once | ORAL | Status: AC
  Administered 2018-12-01: 19:00:00 1 via ORAL
  Filled 2018-12-01: qty 1
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
## 2018-12-01 NOTE — Discharge Instructions (Signed)
Keep area clean by washing with soap and water daily. Do not submerge in water or scrub stitches Watch for signs of infection (redness, drainage, worsening pain) Take Tylenol or Ibuprofen for pain as needed The stitches are dissolvable and should be totally dissolved in about one month Please return if worsening

## 2018-12-10 ENCOUNTER — Ambulatory Visit: Payer: Medicaid Other | Attending: Internal Medicine | Admitting: Internal Medicine

## 2018-12-10 ENCOUNTER — Encounter: Payer: Self-pay | Admitting: Internal Medicine

## 2018-12-10 DIAGNOSIS — Z789 Other specified health status: Secondary | ICD-10-CM

## 2018-12-10 DIAGNOSIS — I2510 Atherosclerotic heart disease of native coronary artery without angina pectoris: Secondary | ICD-10-CM | POA: Diagnosis not present

## 2018-12-10 DIAGNOSIS — Q231 Congenital insufficiency of aortic valve: Secondary | ICD-10-CM | POA: Diagnosis not present

## 2018-12-10 DIAGNOSIS — I77810 Thoracic aortic ectasia: Secondary | ICD-10-CM | POA: Diagnosis not present

## 2018-12-10 DIAGNOSIS — R945 Abnormal results of liver function studies: Secondary | ICD-10-CM | POA: Diagnosis not present

## 2018-12-10 DIAGNOSIS — Q2381 Bicuspid aortic valve: Secondary | ICD-10-CM

## 2018-12-10 DIAGNOSIS — R7989 Other specified abnormal findings of blood chemistry: Secondary | ICD-10-CM

## 2018-12-10 DIAGNOSIS — I10 Essential (primary) hypertension: Secondary | ICD-10-CM | POA: Diagnosis not present

## 2018-12-10 MED ORDER — POTASSIUM CHLORIDE ER 10 MEQ PO TBCR: 10.0000 meq | EXTENDED_RELEASE_TABLET | Freq: Every day | ORAL | 1 refills | Status: DC

## 2018-12-10 MED ORDER — LISINOPRIL 10 MG PO TABS: 10.0000 mg | ORAL_TABLET | Freq: Two times a day (BID) | ORAL | 1 refills | Status: DC

## 2018-12-10 MED FILL — POTASSIUM CHLORIDE ER 10 ME: 10 | 30 days supply | Qty: 30 | Fill #0

## 2018-12-10 NOTE — Progress Notes (Signed)
Virtual Visit via Telephone Note Due to current restrictions/limitations of in-office visits due to the COVID-19 pandemic, this scheduled clinical appointment was converted to a telehealth visit  I connected with Joshua [name] on 12/10/18 at 2:57 p.m by telephone and verified that I am speaking with the correct person using two identifiers. I am in my office.  The patient is at home.  Only the patient and myself participated in this encounter.  I discussed the limitations, risks, security and privacy concerns of performing an evaluation and management service by telephone and the availability of in person appointments. I also discussed with the patient that there may be a patient responsible charge related to this service. The patient expressed understanding and agreed to proceed.   History of Present Illness: Patient with history of HTN, OA BL hips, prediabetes (last A1C 10/2018 nl), CAD [with stent to LAD, RCA],EF55-60%%,bradycardia , bicuspid aortic valve, mod AS, mild dilated ascending aorta.  I last saw him 01/2018.  Purpose of today's visit was chronic disease management.  Had RT THR 04/2018 and LT THR 11/15/2018.  RT hip has healed pretty good.  Still sore from his LT THR.  Saw Dr. Erlinda Hong 2 wks ago.  HTN/CAD:  Checks BP regularly. Some readings are 111/75, 117/76 -limits salt in foods -no CP/SOB/LE edema Compliant with Coreg, Lisinopril and ASA.  He has stayed off statin since last yr after he was found to have significant elev in AST, ALT and Alk. Phos.  LFTs came down but not completely nl.  Tells me that he drinks ETOH beverages occasionally -Last saw his cardiologist Dr. Sallyanne Kuster in 12/2017 he does not have an upcoming appointment.  He is due for repeat echo for check on bicuspid aortic valve and mild dilated ascending aorta.    Outpatient Encounter Medications as of 12/10/2018  Medication Sig  . aspirin EC 81 MG tablet Take 1 tablet (81 mg total) by mouth 2 (two) times daily.  Marland Kitchen  aspirin EC 81 MG tablet Take 1 tablet (81 mg total) by mouth 2 (two) times daily.  . carvedilol (COREG) 3.125 MG tablet Take 1 tablet (3.125 mg total) by mouth 2 (two) times daily.  Marland Kitchen lidocaine (LIDODERM) 5 % PLACE 1 PATCH ONTO THE SKIN DAILY. REMOVE & DISCARD PATCH WITHIN 12 HOURS OR AS DIRECTED BY DOCTOR. (Patient taking differently: Place 1-2 patches onto the skin daily. )  . lisinopril (ZESTRIL) 10 MG tablet Take 1 tablet (10 mg total) by mouth 2 (two) times daily. Must keep appt next week for more refills.  . methocarbamol (ROBAXIN) 500 MG tablet Take 1 tablet (500 mg total) by mouth 2 (two) times daily as needed for muscle spasms.  . methocarbamol (ROBAXIN) 750 MG tablet Take 1 tablet (750 mg total) by mouth 2 (two) times daily as needed for muscle spasms.  . mupirocin ointment (BACTROBAN) 2 % Apply 1 application topically 2 (two) times daily.  . ondansetron (ZOFRAN) 4 MG tablet Take 1-2 tablets (4-8 mg total) by mouth every 8 (eight) hours as needed for nausea or vomiting.  Marland Kitchen oxyCODONE-acetaminophen (PERCOCET) 5-325 MG tablet Take 1-2 tablets by mouth every 8 (eight) hours as needed for severe pain.  . potassium chloride (K-DUR) 10 MEQ tablet Take 10 mEq by mouth daily.  . promethazine (PHENERGAN) 25 MG tablet Take 1 tablet (25 mg total) by mouth every 6 (six) hours as needed for nausea.  Marland Kitchen senna-docusate (SENOKOT S) 8.6-50 MG tablet Take 1 tablet by mouth at bedtime as needed.  Marland Kitchen  senna-docusate (SENOKOT S) 8.6-50 MG tablet Take 1-2 tablets by mouth at bedtime as needed.  . sulfamethoxazole-trimethoprim (BACTRIM DS) 800-160 MG tablet Take 1 tablet by mouth 2 (two) times daily.   No facility-administered encounter medications on file as of 12/10/2018.     Observations/Objective: Lab Results  Component Value Date   WBC 7.7 11/22/2018   HGB 13.3 11/22/2018   HCT 39.9 11/22/2018   MCV 95.0 11/22/2018   PLT 204 11/22/2018     Chemistry      Component Value Date/Time   NA 136 11/22/2018  1520   NA 142 01/05/2018 1235   K 4.2 11/22/2018 1520   CL 102 11/22/2018 1520   CO2 24 11/22/2018 1520   BUN 16 11/22/2018 1520   BUN 13 01/05/2018 1235   CREATININE 0.76 11/22/2018 1520      Component Value Date/Time   CALCIUM 8.8 (L) 11/22/2018 1520   ALKPHOS 164 (H) 11/22/2018 1520   AST 24 11/22/2018 1520   ALT 69 (H) 11/22/2018 1520   BILITOT 0.8 11/22/2018 1520   BILITOT 0.4 02/05/2018 1157     Lab Results  Component Value Date   HGBA1C 5.4 11/12/2018   Lab Results  Component Value Date   CHOL 128 01/05/2018   HDL 65 01/05/2018   LDLCALC 48 01/05/2018   TRIG 77 01/05/2018   CHOLHDL 2.0 01/05/2018      Assessment and Plan: 1. Essential hypertension Reported blood pressure readings at goal.  He will continue lisinopril and carvedilol. - lisinopril (ZESTRIL) 10 MG tablet; Take 1 tablet (10 mg total) by mouth 2 (two) times daily.  Dispense: 180 tablet; Refill: 1 - potassium chloride (K-DUR) 10 MEQ tablet; Take 1 tablet (10 mEq total) by mouth daily.  Dispense: 90 tablet; Refill: 1  2. Abnormal LFTs We will keep him off statin therapy given the elevation in liver enzymes that occur when he is on it.  We will also check for any underlying viral hepatitis - Hepatitis C Antibody; Future - Hepatitis B surface antigen; Future - Hepatitis B surface antibody,quantitative; Future - Mitochondrial antibodies; Future  3. Coronary artery disease involving native coronary artery of native heart without angina pectoris Clinically stable.  Continue aspirin, carvedilol.  He will come to the lab to have a lipid profile.   - Lipid panel; Future  4. Mild dilation of ascending aorta (HCC) 5. Bicuspid aortic valve We will get him back in with his cardiologist for follow-up.  6.  Statin intolerance  Follow Up Instructions: 3 mths. CMA to schedule appointment for him to get his flu shot at the same time that he comes in to get blood test done.   I discussed the assessment and  treatment plan with the patient. The patient was provided an opportunity to ask questions and all were answered. The patient agreed with the plan and demonstrated an understanding of the instructions.   The patient was advised to call back or seek an in-person evaluation if the symptoms worsen or if the condition fails to improve as anticipated.  I provided 13 minutes of non-face-to-face time during this encounter.   Karle Plumber, MD

## 2018-12-14 ENCOUNTER — Telehealth: Payer: Self-pay | Admitting: Cardiovascular Disease

## 2018-12-14 NOTE — Telephone Encounter (Signed)
LVM for patient to call and schedule followup with Dr. Croitoru. °

## 2018-12-28 ENCOUNTER — Encounter: Payer: Self-pay | Admitting: Orthopaedic Surgery

## 2018-12-28 ENCOUNTER — Ambulatory Visit (INDEPENDENT_AMBULATORY_CARE_PROVIDER_SITE_OTHER): Payer: Medicaid Other

## 2018-12-28 ENCOUNTER — Ambulatory Visit (INDEPENDENT_AMBULATORY_CARE_PROVIDER_SITE_OTHER): Payer: Medicaid Other | Admitting: Physician Assistant

## 2018-12-28 VITALS — Ht 73.0 in | Wt 205.0 lb

## 2018-12-28 DIAGNOSIS — Z96642 Presence of left artificial hip joint: Secondary | ICD-10-CM

## 2018-12-28 NOTE — Progress Notes (Signed)
Post-Op Visit Note   Patient: Joshua [name]           Date of Birth: [birth year]           MRN: [id number] Visit Date: 12/28/2018 PCP: Ladell Pier, MD   Assessment & Plan:  Chief Complaint:  Chief Complaint  Patient presents with  . Left Hip - Routine Post Op    Left THA DOS 11/15/2018   Visit Diagnoses:  1. Status post total replacement of left hip     Plan: Patient is a pleasant 58 year old gentleman who presents our clinic today 6 weeks status post left anterior total hip replacement, date of surgery 11/15/2018.  He has been doing well.  He has noticed increased pain over the past week after mowing his yard twice with his riding lawnmower.  He is starting to slowly improve.  Otherwise, no complaints.  Examination of his left hip reveals a fully healed surgical incision without evidence of infection or cellulitis.  Calf is soft and nontender.  He is able to hip flex without pain.  At this point, we will continue to increase activity as tolerated.  He will follow-up with [place] in 6 weeks time for repeat evaluation.  Dental prophylaxis reinforced.  Call with concerns or questions.  Follow-Up Instructions: Return in about 6 weeks (around 02/08/2019).   Orders:  Orders Placed This Encounter  Procedures  . XR Pelvis 1-2 Views   No orders of the defined types were placed in this encounter.   Imaging: Xr Pelvis 1-2 Views  Result Date: 12/28/2018 Well-seated prosthesis without complication   PMFS History: Patient Active Problem List   Diagnosis Date Noted  . Statin intolerance 12/10/2018  . 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/15/2018  . Chronic left shoulder pain 06/08/2018  . Status post total hip replacement, right 04/26/2018  . Mild dilation of ascending aorta (HCC) 12/26/2017  . Dyslipidemia 12/26/2017  . Bilateral carotid bruits 12/26/2017  . Nocturnal hypoxemia 06/29/2017  . Primary osteoarthritis of both hips 06/29/2017  . Coronary artery disease  involving native coronary artery of native heart without angina pectoris 03/31/2017  . Ischemic cardiomyopathy 03/31/2017  . Marijuana use 03/31/2017  . Prediabetes 03/31/2017  . Aortic valve insufficiency 12/28/2016  . Mitral valve regurgitation 12/28/2016  . Obesity 12/28/2016  . Aortic valve stenosis, nonrheumatic   . Essential hypertension    Past Medical History:  Diagnosis Date  . Aortic stenosis   . CAD (coronary artery disease), native coronary artery 12/26/2016   DES LAD & RCA, EF 25-30%  . CHF (congestive heart failure) ([place])   . Dyslipidemia   . Hypertension   . Obesity   . Osteoarthritis   . Pre-diabetes    a. prior A1C 5.6, states he was on meds for this at one point.    Family History  Problem Relation Age of Onset  . CAD Father        MI/CABG age 19    Past Surgical History:  Procedure Laterality Date  . CORONARY STENT INTERVENTION N/A 12/29/2016   Procedure: CORONARY STENT INTERVENTION;  Surgeon: Jettie Booze, MD;  Location: [facility];  Service: Cardiovascular;  Laterality: N/A;  . RIGHT/LEFT HEART CATH AND CORONARY ANGIOGRAPHY N/A 12/29/2016   Procedure: RIGHT/LEFT HEART CATH AND CORONARY ANGIOGRAPHY;  Surgeon: Jettie Booze, MD;  Location: [facility];  Service: Cardiovascular;  Laterality: N/A;  . TONSILLECTOMY    . TOTAL HIP ARTHROPLASTY Right 04/26/2018  Procedure: RIGHT TOTAL HIP ARTHROPLASTY ANTERIOR APPROACH;  Surgeon: Leandrew Koyanagi, MD;  Location: [facility];  Service: Orthopedics;  Laterality: Right;  . TOTAL HIP ARTHROPLASTY Left 11/15/2018  . TOTAL HIP ARTHROPLASTY Left 11/15/2018   Procedure: LEFT TOTAL HIP ARTHROPLASTY ANTERIOR APPROACH;  Surgeon: Leandrew Koyanagi, MD;  Location: [facility];  Service: Orthopedics;  Laterality: Left;   Social History   Occupational History  . Not on file  Tobacco Use  . Smoking status: Former Research scientist (life sciences)  . Smokeless tobacco: Never Used  . Tobacco comment: Smoked lightly for approx 20 years, quit  1990s  Substance and Sexual Activity  . Alcohol use: Yes    Comment: 1-2 mixed drinks per week  . Drug use: Yes    Types: Marijuana    Comment: occasional  . Sexual activity: Not on file

## 2018-12-30 MED FILL — LISINOPRIL 10 MG TABS: 10 | 30 days supply | Qty: 60 | Fill #0

## 2018-12-30 MED FILL — POTASSIUM CHLORIDE ER 10 ME: 10 | 30 days supply | Qty: 30 | Fill #1

## 2019-01-18 ENCOUNTER — Telehealth: Payer: Self-pay | Admitting: Orthopaedic Surgery

## 2019-01-18 NOTE — Telephone Encounter (Signed)
Patient called advised he has a dental appointment 02/01/2019 and will need an antibiotic. The number to contact patient is [phone]

## 2019-01-18 NOTE — Telephone Encounter (Signed)
Yes 2 g

## 2019-01-18 NOTE — Telephone Encounter (Signed)
Okay to send Amoxicillin?

## 2019-01-19 ENCOUNTER — Other Ambulatory Visit: Payer: Self-pay

## 2019-01-19 MED ORDER — AMOXICILLIN 500 MG PO TABS: ORAL_TABLET | ORAL | 0 refills | Status: DC

## 2019-01-19 MED FILL — AMOXICILLIN 500 MG CAPSULE: 500 | 1 days supply | Qty: 4 | Fill #0

## 2019-01-19 NOTE — Telephone Encounter (Signed)
Called patient to let him know Rx was called into pharm.

## 2019-01-19 NOTE — Telephone Encounter (Signed)
Faxed Rx into pharm

## 2019-01-20 ENCOUNTER — Telehealth: Payer: Self-pay

## 2019-01-20 NOTE — Telephone Encounter (Signed)
For Lidoderm Patch PA approval-has pt had trial/failure of Diclofenac gel?

## 2019-02-07 MED FILL — POTASSIUM CHLORIDE ER 10 ME: 10 | 30 days supply | Qty: 30 | Fill #2

## 2019-02-07 MED FILL — LISINOPRIL 10 MG TABS: 10 | 30 days supply | Qty: 60 | Fill #1

## 2019-02-08 ENCOUNTER — Ambulatory Visit: Payer: Medicaid Other | Admitting: Orthopaedic Surgery

## 2019-02-15 ENCOUNTER — Ambulatory Visit (INDEPENDENT_AMBULATORY_CARE_PROVIDER_SITE_OTHER): Payer: Medicaid Other | Admitting: Orthopaedic Surgery

## 2019-02-15 ENCOUNTER — Other Ambulatory Visit: Payer: Self-pay

## 2019-02-15 DIAGNOSIS — Z96642 Presence of left artificial hip joint: Secondary | ICD-10-CM | POA: Diagnosis not present

## 2019-02-15 DIAGNOSIS — M25512 Pain in left shoulder: Secondary | ICD-10-CM | POA: Diagnosis not present

## 2019-02-15 DIAGNOSIS — G8929 Other chronic pain: Secondary | ICD-10-CM | POA: Diagnosis not present

## 2019-02-15 MED ORDER — PREDNISONE 10 MG (21) PO TBPK: ORAL_TABLET | ORAL | 0 refills | Status: DC

## 2019-02-15 MED ORDER — AMOXICILLIN 500 MG PO CAPS: 2000.0000 mg | ORAL_CAPSULE | Freq: Once | ORAL | 6 refills | Status: AC

## 2019-02-15 MED FILL — AMOXICILLIN 500 MG CAPSULE: 500 | 4 days supply | Qty: 4 | Fill #0

## 2019-02-15 MED FILL — predniSONE 10 MG TABS: 10 | 6 days supply | Qty: 21 | Fill #0

## 2019-02-15 NOTE — Progress Notes (Signed)
Office Visit Note   Patient: Joshua [name]           Date of Birth: Sep 28, 1960           MRN: [id number] Visit Date: 02/15/2019              Requested by: Ladell Pier, MD [address] [place],  [place] [address] PCP: Ladell Pier, MD   Assessment & Plan: Visit Diagnoses:  1. Status post total replacement of left hip   2. Chronic left shoulder pain     Plan: My patient is doing well from his hip replacement.  We will refill his prednisone for the left shoulder pain.  Amoxicillin was prescribed for his upcoming dental visit.  We will see him back in 3 months with AP pelvis and lateral hips.  Follow-Up Instructions: Return in about 3 months (around 05/18/2019).   Orders:  No orders of the defined types were placed in this encounter.  Meds ordered this encounter  Medications  . predniSONE (STERAPRED UNI-PAK 21 TAB) 10 MG (21) TBPK tablet    Sig: Take as directed    Dispense:  21 tablet    Refill:  0  . amoxicillin (AMOXIL) 500 MG capsule    Sig: Take 4 capsules (2,000 mg total) by mouth once for 1 dose.    Dispense:  4 capsule    Refill:  6      Procedures: No procedures performed   Clinical Data: No additional findings.   Subjective: Chief Complaint  Patient presents with  . Left Hip - Pain, Follow-up, Routine Post Op    [name] is 3 months status post left total knee replacement.  He is doing well overall.  He still has some soreness and some discomfort from surgery but overall he is happy.  He has pretty much resumed all physical activities.  He is complaining of some recurrent left shoulder pain.  He has had good response to previous prednisone Dosepak he would like to try another one.   Review of Systems   Objective: Vital Signs: There were no vitals taken for this visit.  Physical Exam  Ortho Exam Left hip exam shows fully healed surgical scar.  Ambulating well. Specialty Comments:  No specialty comments available.  Imaging: No  results found.   PMFS History: Patient Active Problem List   Diagnosis Date Noted  . Statin intolerance 12/10/2018  . 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/15/2018  . Chronic left shoulder pain 06/08/2018  . Status post total hip replacement, right 04/26/2018  . Mild dilation of ascending aorta (HCC) 12/26/2017  . Dyslipidemia 12/26/2017  . Bilateral carotid bruits 12/26/2017  . Nocturnal hypoxemia 06/29/2017  . Primary osteoarthritis of both hips 06/29/2017  . Coronary artery disease involving native coronary artery of native heart without angina pectoris 03/31/2017  . Ischemic cardiomyopathy 03/31/2017  . Marijuana use 03/31/2017  . Prediabetes 03/31/2017  . Aortic valve insufficiency 12/28/2016  . Mitral valve regurgitation 12/28/2016  . Obesity 12/28/2016  . Aortic valve stenosis, nonrheumatic   . Essential hypertension    Past Medical History:  Diagnosis Date  . Aortic stenosis   . CAD (coronary artery disease), native coronary artery 12/26/2016   DES LAD & RCA, EF 25-30%  . CHF (congestive heart failure) ([place])   . Dyslipidemia   . Hypertension   . Obesity   . Osteoarthritis   . Pre-diabetes    a. prior A1C 5.6, states he was on  meds for this at one point.    Family History  Problem Relation Age of Onset  . CAD Father        MI/CABG age 87    Past Surgical History:  Procedure Laterality Date  . CORONARY STENT INTERVENTION N/A 12/29/2016   Procedure: CORONARY STENT INTERVENTION;  Surgeon: Jettie Booze, MD;  Location: [facility];  Service: Cardiovascular;  Laterality: N/A;  . RIGHT/LEFT HEART CATH AND CORONARY ANGIOGRAPHY N/A 12/29/2016   Procedure: RIGHT/LEFT HEART CATH AND CORONARY ANGIOGRAPHY;  Surgeon: Jettie Booze, MD;  Location: [facility];  Service: Cardiovascular;  Laterality: N/A;  . TONSILLECTOMY    . TOTAL HIP ARTHROPLASTY Right 04/26/2018   Procedure: RIGHT TOTAL HIP ARTHROPLASTY ANTERIOR APPROACH;  Surgeon: Leandrew Koyanagi, MD;  Location: [facility];  Service: Orthopedics;  Laterality: Right;  . TOTAL HIP ARTHROPLASTY Left 11/15/2018  . TOTAL HIP ARTHROPLASTY Left 11/15/2018   Procedure: LEFT TOTAL HIP ARTHROPLASTY ANTERIOR APPROACH;  Surgeon: Leandrew Koyanagi, MD;  Location: [facility];  Service: Orthopedics;  Laterality: Left;   Social History   Occupational History  . Not on file  Tobacco Use  . Smoking status: Former Research scientist (life sciences)  . Smokeless tobacco: Never Used  . Tobacco comment: Smoked lightly for approx 20 years, quit 1990s  Substance and Sexual Activity  . Alcohol use: Yes    Comment: 1-2 mixed drinks per week  . Drug use: Yes    Types: Marijuana    Comment: occasional  . Sexual activity: Not on file

## 2019-02-24 ENCOUNTER — Other Ambulatory Visit: Payer: Self-pay

## 2019-02-24 ENCOUNTER — Encounter: Payer: Self-pay | Admitting: Cardiovascular Disease

## 2019-02-24 ENCOUNTER — Ambulatory Visit: Payer: Medicaid Other | Admitting: Cardiovascular Disease

## 2019-02-24 VITALS — BP 122/72 | HR 54 | Temp 95.0°F | Ht 73.0 in | Wt 224.0 lb

## 2019-02-24 DIAGNOSIS — I77810 Thoracic aortic ectasia: Secondary | ICD-10-CM

## 2019-02-24 DIAGNOSIS — Z8679 Personal history of other diseases of the circulatory system: Secondary | ICD-10-CM | POA: Diagnosis not present

## 2019-02-24 DIAGNOSIS — I350 Nonrheumatic aortic (valve) stenosis: Secondary | ICD-10-CM

## 2019-02-24 DIAGNOSIS — I10 Essential (primary) hypertension: Secondary | ICD-10-CM

## 2019-02-24 DIAGNOSIS — E785 Hyperlipidemia, unspecified: Secondary | ICD-10-CM | POA: Diagnosis not present

## 2019-02-24 DIAGNOSIS — R0989 Other specified symptoms and signs involving the circulatory and respiratory systems: Secondary | ICD-10-CM

## 2019-02-24 DIAGNOSIS — I2510 Atherosclerotic heart disease of native coronary artery without angina pectoris: Secondary | ICD-10-CM

## 2019-02-24 LAB — LIPID PANEL
Chol/HDL Ratio: 3.2 ratio (ref 0.0–5.0)
Cholesterol, Total: 168 mg/dL (ref 100–199)
HDL: 52 mg/dL (ref >39)
LDL Chol Calc (NIH): 89 mg/dL (ref 0–99)
Triglycerides: 154 mg/dL — ABNORMAL HIGH (ref 0–149)
VLDL Cholesterol Cal: 27 mg/dL (ref 5–40)

## 2019-02-24 LAB — COMPREHENSIVE METABOLIC PANEL
ALT: 25 IU/L (ref 0–44)
AST: 18 IU/L (ref 0–40)
Albumin/Globulin Ratio: 1.8 (ref 1.2–2.2)
Albumin: 4.6 g/dL (ref 3.8–4.9)
Alkaline Phosphatase: 154 IU/L — ABNORMAL HIGH (ref 39–117)
BUN/Creatinine Ratio: 20 (ref 10–24)
BUN: 17 mg/dL (ref 8–27)
Bilirubin Total: 0.3 mg/dL (ref 0.0–1.2)
CO2: 25 mmol/L (ref 20–29)
Calcium: 9.4 mg/dL (ref 8.6–10.2)
Chloride: 102 mmol/L (ref 96–106)
Creatinine, Ser: 0.86 mg/dL (ref 0.76–1.27)
GFR calc Af Amer: 109 mL/min/{1.73_m2} (ref >59)
GFR calc non Af Amer: 94 mL/min/{1.73_m2} (ref >59)
Globulin, Total: 2.6 g/dL (ref 1.5–4.5)
Glucose: 100 mg/dL — ABNORMAL HIGH (ref 65–99)
Potassium: 4.8 mmol/L (ref 3.5–5.2)
Sodium: 140 mmol/L (ref 134–144)
Total Protein: 7.2 g/dL (ref 6.0–8.5)

## 2019-02-24 NOTE — Progress Notes (Signed)
Cardiology Office Note:    Date:  02/26/2019   ID:  Joshua [name], DOB [birth year], MRN [id number]  PCP:  Ladell Pier, MD  Cardiologist:  Sanda Klein, MD   Referring MD: Ladell Pier, MD   Chief Complaint  Patient presents with  . Coronary Artery Disease    History of Present Illness:    Joshua [name] is a 58 y.o. male who presented with decompensated heart failure and severe cardiomyopathy (EF 25-30 %) in September 2018 and was identified as having severe 2 vessel coronary artery disease, with drug-eluting stents placed to the LAD and RCA. Following revascularization he has had remarkable improvement in LV systolic function which is now normal (EF 55-60%). He has a bicuspid aortic valve, moderate aortic stenosis (per echo January 2019 mean gradient 25 mmHg) and very mild aortic root enlargement (40 mm).  He feels great and has no cardiovascular complaints.The patient specifically denies any chest pain at rest exertion, dyspnea at rest or with exertion, orthopnea, paroxysmal nocturnal dyspnea, syncope, palpitations, focal neurological deficits, intermittent claudication, lower extremity edema, unexplained weight gain, cough, hemoptysis or wheezing.  He feels much better compared to last year after undergoing total left hip replacement on November 15, 2018.  He is walking without assistive devices and is actually quite active.  Atorvastatin was stopped due to liver test abnormalities.  Alkaline phosphatase increased to 591, bilirubin was mildly elevated at 1.6, AST 200, ALT 428.  Viral studies were negative.  He did not have antimitochondrial antibodies.  Transaminases have normalized, but his alkaline phosphatase is still mildly elevated at 154.  Bilirubin is normal.  On labs performed during this appointment his LDL cholesterol was 89, not quite at target (less than 70), but he does not want to restart statin medications.  His blood pressure is better controlled when  he takes lisinopril in 2 divided doses.  He did not tolerate beta blockers due to extreme bradycardia.  Past Medical History:  Diagnosis Date  . Aortic stenosis   . CAD (coronary artery disease), native coronary artery 12/26/2016   DES LAD & RCA, EF 25-30%  . CHF (congestive heart failure) ([facility])   . Dyslipidemia   . Hypertension   . Obesity   . Osteoarthritis   . Pre-diabetes    a. prior A1C 5.6, states he was on meds for this at one point.    Past Surgical History:  Procedure Laterality Date  . CORONARY STENT INTERVENTION N/A 12/29/2016   Procedure: CORONARY STENT INTERVENTION;  Surgeon: Jettie Booze, MD;  Location: [facility];  Service: Cardiovascular;  Laterality: N/A;  . RIGHT/LEFT HEART CATH AND CORONARY ANGIOGRAPHY N/A 12/29/2016   Procedure: RIGHT/LEFT HEART CATH AND CORONARY ANGIOGRAPHY;  Surgeon: Jettie Booze, MD;  Location: [facility];  Service: Cardiovascular;  Laterality: N/A;  . TONSILLECTOMY    . TOTAL HIP ARTHROPLASTY Right 04/26/2018   Procedure: RIGHT TOTAL HIP ARTHROPLASTY ANTERIOR APPROACH;  Surgeon: Leandrew Koyanagi, MD;  Location: [facility];  Service: Orthopedics;  Laterality: Right;  . TOTAL HIP ARTHROPLASTY Left 11/15/2018  . TOTAL HIP ARTHROPLASTY Left 11/15/2018   Procedure: LEFT TOTAL HIP ARTHROPLASTY ANTERIOR APPROACH;  Surgeon: Leandrew Koyanagi, MD;  Location: [facility];  Service: Orthopedics;  Laterality: Left;    Current Medications: Current Meds  Medication Sig  . aspirin EC 81 MG tablet Take 1 tablet (81 mg total) by mouth 2 (two) times daily.  Marland Kitchen aspirin EC 81 MG tablet Take  1 tablet (81 mg total) by mouth 2 (two) times daily.  . carvedilol (COREG) 3.125 MG tablet Take 1 tablet (3.125 mg total) by mouth 2 (two) times daily.  Marland Kitchen lisinopril (ZESTRIL) 10 MG tablet Take 1 tablet (10 mg total) by mouth 2 (two) times daily.  . methocarbamol (ROBAXIN) 750 MG tablet Take 1 tablet (750 mg total) by mouth 2 (two) times daily as needed for muscle  spasms.  . mupirocin ointment (BACTROBAN) 2 % Apply 1 application topically 2 (two) times daily.  . ondansetron (ZOFRAN) 4 MG tablet Take 1-2 tablets (4-8 mg total) by mouth every 8 (eight) hours as needed for nausea or vomiting.  . potassium chloride (K-DUR) 10 MEQ tablet Take 1 tablet (10 mEq total) by mouth daily.  . predniSONE (STERAPRED UNI-PAK 21 TAB) 10 MG (21) TBPK tablet Take as directed (Patient not taking: Reported on 02/25/2019)  . senna-docusate (SENOKOT S) 8.6-50 MG tablet Take 1 tablet by mouth at bedtime as needed.  . senna-docusate (SENOKOT S) 8.6-50 MG tablet Take 1-2 tablets by mouth at bedtime as needed.  . [DISCONTINUED] amoxicillin (AMOXIL) 500 MG tablet TAKE 4 TABS PO 30 MINS BEFORE PROCEDURE (Patient not taking: Reported on 02/25/2019)  . [DISCONTINUED] lidocaine (LIDODERM) 5 % PLACE 1 PATCH ONTO THE SKIN DAILY. REMOVE & DISCARD PATCH WITHIN 12 HOURS OR AS DIRECTED BY DOCTOR. (Patient taking differently: Place 1-2 patches onto the skin daily. )  . [DISCONTINUED] methocarbamol (ROBAXIN) 500 MG tablet Take 1 tablet (500 mg total) by mouth 2 (two) times daily as needed for muscle spasms.  . [DISCONTINUED] oxyCODONE-acetaminophen (PERCOCET) 5-325 MG tablet Take 1-2 tablets by mouth every 8 (eight) hours as needed for severe pain.  . [DISCONTINUED] promethazine (PHENERGAN) 25 MG tablet Take 1 tablet (25 mg total) by mouth every 6 (six) hours as needed for nausea. (Patient not taking: Reported on 02/25/2019)  . [DISCONTINUED] sulfamethoxazole-trimethoprim (BACTRIM DS) 800-160 MG tablet Take 1 tablet by mouth 2 (two) times daily. (Patient not taking: Reported on 02/25/2019)     Allergies:   Patient has no known allergies.   Social History   Socioeconomic History  . Marital status: Single    Spouse name: Not on file  . Number of children: Not on file  . Years of education: Not on file  . Highest education level: Not on file  Occupational History  . Not on file  Social Needs   . Financial resource strain: Not on file  . Food insecurity    Worry: Not on file    Inability: Not on file  . Transportation needs    Medical: Not on file    Non-medical: Not on file  Tobacco Use  . Smoking status: Former Research scientist (life sciences)  . Smokeless tobacco: Never Used  . Tobacco comment: Smoked lightly for approx 20 years, quit 1990s  Substance and Sexual Activity  . Alcohol use: Yes    Comment: 1-2 mixed drinks per week  . Drug use: Yes    Types: Marijuana    Comment: occasional  . Sexual activity: Not on file  Lifestyle  . Physical activity    Days per week: Not on file    Minutes per session: Not on file  . Stress: Not on file  Relationships  . Social Herbalist on phone: Not on file    Gets together: Not on file    Attends religious service: Not on file    Active member of club or organization: Not on  file    Attends meetings of clubs or organizations: Not on file    Relationship status: Not on file  Other Topics Concern  . Not on file  Social History Narrative   Lives Home alone. Sister makes all health related decisions. Declined Advanced directive at this time     Family History: The patient's family history includes CAD in his father.  ROS:   Please see the history of present illness.    All other systems are reviewed and are negative.   EKGs/Labs/Other Studies Reviewed:    EKG:  EKG is ordered today.  Shows sinus bradycardia with mild first-degree AV block (210 ms), voltage only for left ventricular hypertrophy, no repolarization abnormalities, QTC 409 ms  ECHO (January 2019): - Left ventricle: The cavity size was normal. Systolic function was   normal. The estimated ejection fraction was in the range of 55%   to 60%. Wall motion was normal; there were no regional wall   motion abnormalities. Left ventricular diastolic function   parameters were normal. - Aortic valve: Valve mobility was restricted. There was mild   regurgitation. Mean gradient  (S): 26 mm Hg. Valve area (VTI):   1.62 cm^2. Valve area (Vmax): 1.47 cm^2. Valve area (Vmean): 1.36   cm^2. Regurgitation pressure half-time: 620 ms. - Aorta: Aortic root dimension: 39 mm (ED). Ascending aortic   diameter: 40 mm (S). - Aortic root: The aortic root was mildly dilated. - Ascending aorta: The ascending aorta was mildly dilated. - Mitral valve: There was mild regurgitation. - Left atrium: The atrium was mildly dilated. - Right ventricle: The cavity size was mildly dilated. Wall   thickness was normal. - Pulmonary arteries: Systolic pressure could not be accurately   estimated.   Recent Labs: 11/22/2018: Hemoglobin 13.3; Platelets 204 02/24/2019: ALT 25; BUN 17; Creatinine, Ser 0.86; Potassium 4.8; Sodium 140  Recent Lipid Panel    Component Value Date/Time   CHOL 168 02/24/2019 1035   TRIG 154 (H) 02/24/2019 1035   HDL 52 02/24/2019 1035   CHOLHDL 3.2 02/24/2019 1035   CHOLHDL 4.6 12/27/2016 0238   VLDL 18 12/27/2016 0238   LDLCALC 89 02/24/2019 1035    Physical Exam:    VS:  BP 122/72   Pulse (!) 54   Temp (!) 95 F (35 C)   Ht 6\' 1"  (1.854 m)   Wt 224 lb (101.6 kg)   SpO2 95%   BMI 29.55 kg/m     Wt Readings from Last 3 Encounters:  02/25/19 227 lb 6.4 oz (103.1 kg)  02/24/19 224 lb (101.6 kg)  12/28/18 205 lb (93 kg)      General: Alert, oriented x3, no distress, borderline obese Head: no evidence of trauma, PERRL, EOMI, no exophtalmos or lid lag, no myxedema, no xanthelasma; normal ears, nose and oropharynx Neck: normal jugular venous pulsations and no hepatojugular reflux; brisk carotid pulses without delay.  There are carotid bruits that radiate from the chest Chest: clear to auscultation, no signs of consolidation by percussion or palpation, normal fremitus, symmetrical and full respiratory excursions Cardiovascular: normal position and quality of the apical impulse, regular rhythm, normal first and second heart sounds, 3/6 early peaking  systolic ejection murmur heard broadly throughout the entire precordium no diastolic murmurs, rubs or gallops Abdomen: no tenderness or distention, no masses by palpation, no abnormal pulsatility or arterial bruits, normal bowel sounds, no hepatosplenomegaly Extremities: no clubbing, cyanosis or edema; 2+ radial, ulnar and brachial pulses bilaterally; 2+ right femoral,  posterior tibial and dorsalis pedis pulses; 2+ left femoral, posterior tibial and dorsalis pedis pulses; no subclavian or femoral bruits Neurological: grossly nonfocal Psych: Normal mood and affect   ASSESSMENT:    1. History of CHF (congestive heart failure)   2. Coronary artery disease involving native coronary artery of native heart without angina pectoris   3. Nonrheumatic aortic valve stenosis   4. Bilateral carotid bruits   5. Essential hypertension   6. Dyslipidemia   7. Mild dilation of ascending aorta (HCC)    PLAN:    In order of problems listed above:  1. CHF: Systolic dysfunction resolved completely after revascularization.  He does not have symptoms or signs of congestive heart failure 2. CAD: Despite being physically active he does not have angina pectoris 3. AS: probably bicuspid aortic valve, moderate severity; mild associated aortic dilation.  He does not have exertional angina/dyspnea/syncope.  I think we should repeat his echocardiogram.  Briefly discussed options for TAVR versus surgical aortic valve replacement. 4. Bilateral carotid bruits: No evidence for obstructive lesions on duplex ultrasound September 2019. 5. HTN: Excellent control 6. HLP: Markedly abnormal liver tests while he was taking atorvastatin, improved after his discontinuation, although some lingular abnormalities and alkaline phosphatase are still present.  He does not want to restart statin due to the severity of his liver test changes.  With his lifestyle improvement, his lipid profile is really not that bad.  He has an excellent HDL  cholesterol. he had really improved his weight, but has gained weight back during the coronavirus pandemic.  Discussed the importance of regular moderate aerobic exercise, avoid intense straining 7. 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  It is time to recheck echo and if there is any sign of further aortic root enlargement he should have a CT of the chest    Medication Adjustments/Labs and Tests Ordered: Current medicines are reviewed at length with the patient today.  Concerns regarding medicines are outlined above.  Orders Placed This Encounter  Procedures  . Comprehensive metabolic panel  . Lipid panel  . EKG 12-Lead  . ECHOCARDIOGRAM COMPLETE   No orders of the defined types were placed in this encounter.   Signed, Sanda Klein, MD  02/26/2019 7:25 PM    [facility]

## 2019-02-24 NOTE — Patient Instructions (Signed)
Medication Instructions:  No changes *If you need a refill on your cardiac medications before your next appointment, please call your pharmacy*  Lab Work: Your provider would like for you to have the following labs today: Lipid and CMET  If you have labs (blood work) drawn today and your tests are completely normal, you will receive your results only by: Marland Kitchen MyChart Message (if you have MyChart) OR . A paper copy in the mail If you have any lab test that is abnormal or we need to change your treatment, we will call you to review the results.  Testing/Procedures: Your physician has requested that you have an echocardiogram in January. Echocardiography is a painless test that uses sound waves to create images of your heart. It provides your doctor with information about the size and shape of your heart and how well your heart's chambers and valves are working. You may receive an ultrasound enhancing agent through an IV if needed to better visualize your heart during the echo.This procedure takes approximately one hour. There are no restrictions for this procedure. This will take place at the [address], [address].    Follow-Up: At [facility], you and your health needs are our priority.  As part of our continuing mission to provide you with exceptional heart care, we have created designated Provider Care Teams.  These Care Teams include your primary Cardiologist (physician) and Advanced Practice Providers (APPs -  Physician Assistants and Nurse Practitioners) who all work together to provide you with the care you need, when you need it.  Your next appointment:   12 months  The format for your next appointment:   In Person  Provider:   Sanda Klein, MD

## 2019-02-25 ENCOUNTER — Ambulatory Visit: Payer: Medicaid Other | Attending: Internal Medicine | Admitting: Internal Medicine

## 2019-02-25 ENCOUNTER — Encounter: Payer: Self-pay | Admitting: Internal Medicine

## 2019-02-25 ENCOUNTER — Telehealth: Payer: Self-pay | Admitting: *Deleted

## 2019-02-25 ENCOUNTER — Ambulatory Visit (HOSPITAL_BASED_OUTPATIENT_CLINIC_OR_DEPARTMENT_OTHER): Payer: Medicaid Other | Admitting: Pharmacist

## 2019-02-25 VITALS — BP 125/78 | HR 60 | Temp 98.9°F | Resp 16 | Wt 227.4 lb

## 2019-02-25 DIAGNOSIS — I10 Essential (primary) hypertension: Secondary | ICD-10-CM | POA: Diagnosis not present

## 2019-02-25 DIAGNOSIS — E785 Hyperlipidemia, unspecified: Secondary | ICD-10-CM | POA: Insufficient documentation

## 2019-02-25 DIAGNOSIS — I2510 Atherosclerotic heart disease of native coronary artery without angina pectoris: Secondary | ICD-10-CM | POA: Diagnosis not present

## 2019-02-25 DIAGNOSIS — R7303 Prediabetes: Secondary | ICD-10-CM | POA: Insufficient documentation

## 2019-02-25 DIAGNOSIS — Z789 Other specified health status: Secondary | ICD-10-CM

## 2019-02-25 DIAGNOSIS — Q231 Congenital insufficiency of aortic valve: Secondary | ICD-10-CM | POA: Insufficient documentation

## 2019-02-25 DIAGNOSIS — Z87891 Personal history of nicotine dependence: Secondary | ICD-10-CM | POA: Insufficient documentation

## 2019-02-25 DIAGNOSIS — F1290 Cannabis use, unspecified, uncomplicated: Secondary | ICD-10-CM | POA: Insufficient documentation

## 2019-02-25 DIAGNOSIS — Z955 Presence of coronary angioplasty implant and graft: Secondary | ICD-10-CM | POA: Diagnosis not present

## 2019-02-25 DIAGNOSIS — Z23 Encounter for immunization: Secondary | ICD-10-CM

## 2019-02-25 DIAGNOSIS — E669 Obesity, unspecified: Secondary | ICD-10-CM | POA: Insufficient documentation

## 2019-02-25 DIAGNOSIS — I5032 Chronic diastolic (congestive) heart failure: Secondary | ICD-10-CM

## 2019-02-25 DIAGNOSIS — E6609 Other obesity due to excess calories: Secondary | ICD-10-CM

## 2019-02-25 DIAGNOSIS — M160 Bilateral primary osteoarthritis of hip: Secondary | ICD-10-CM | POA: Insufficient documentation

## 2019-02-25 DIAGNOSIS — Z79899 Other long term (current) drug therapy: Secondary | ICD-10-CM | POA: Diagnosis not present

## 2019-02-25 DIAGNOSIS — M19012 Primary osteoarthritis, left shoulder: Secondary | ICD-10-CM | POA: Insufficient documentation

## 2019-02-25 DIAGNOSIS — Z6830 Body mass index (BMI) 30.0-30.9, adult: Secondary | ICD-10-CM | POA: Insufficient documentation

## 2019-02-25 DIAGNOSIS — I255 Ischemic cardiomyopathy: Secondary | ICD-10-CM | POA: Insufficient documentation

## 2019-02-25 DIAGNOSIS — Z7982 Long term (current) use of aspirin: Secondary | ICD-10-CM | POA: Diagnosis not present

## 2019-02-25 MED ORDER — LIDOCAINE 5 % EX PTCH: 1.0000 | MEDICATED_PATCH | CUTANEOUS | 1 refills | Status: DC

## 2019-02-25 NOTE — Telephone Encounter (Signed)
-----   Message from Sanda Klein, MD sent at 02/25/2019 10:18 AM EST ----- Fasting blood glucose is borderline, but improved from a year ago.  All the other routine blood tests are normal except for the mildly elevated alkaline phosphatase (this is a mild and longstanding abnormality for him). The LDL cholesterol is higher than our desired range (would prefer less than 70) this has steadily increased over the last couple of years. He was on atorvastatin 40 mg daily but this was dropped off his list.  Is he still taking it?  Was it stopped due to side effects?  If not, I think he needs to restart taking a statin.  He may not need that high of a dose.  I would recommend starting atorvastatin 10 mg daily and rechecking labs in 3 months (lipid profile only).

## 2019-02-25 NOTE — Progress Notes (Signed)
Patient presents for vaccination against influenza per orders of Dr. Johnson. Consent given. Counseling provided. No contraindications exists. Vaccine administered without incident.   

## 2019-02-25 NOTE — Progress Notes (Signed)
Patient ID: DAVED MCFANN, male    DOB: 1960/10/05  MRN: [id number]  CC: Hypertension   Subjective: Joshua [name] is a 58 y.o. male who presents for chronic ds management His concerns today include:  Patient with history of HTN, OA BL hips, prediabetes (last A1C 10/2018 nl), CAD [with stent to LAD, RCA],EF55-60%%,bradycardia , bicuspid aortic valve, mod AS, mild dilated ascending aorta.  Wants Lidocaine patch for LT shoulder pain.   Last X-ray done 05/2018 revealed marked DJD.  Pt states he declined steroid inj by ortho.  He request Prednisone instead.  Plans to pick up rxn today.  HTN/CAD/bicuspid aortic valve:  Saw cardiologist yesterday.  LDL cholesterol was 89.  Cardiology wanted pt to restart low dose Lipitor.  Pt does not want to go back on med for this.  He plans to change his eating habits.  Statin stopped last yr due to significant elev in LFTs that return to nl (except Alk phos) after medication was stopped.  Compliant with Coreg, Lisinopril and ASA -echo scheduled for 04/2018 No CP/SOB/LE edema/HA/dizziness  No getting in much exercise.  "COVID blues, I guess."  Gained 22 lbs since 12/2018  Plans to go to dentist at [facility] to get teeth fix   Patient Active Problem List   Diagnosis Date Noted  . Statin intolerance 12/10/2018  . 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/15/2018  . Chronic left shoulder pain 06/08/2018  . Status post total hip replacement, right 04/26/2018  . Mild dilation of ascending aorta (HCC) 12/26/2017  . Dyslipidemia 12/26/2017  . Bilateral carotid bruits 12/26/2017  . Nocturnal hypoxemia 06/29/2017  . Primary osteoarthritis of both hips 06/29/2017  . Coronary artery disease involving native coronary artery of native heart without angina pectoris 03/31/2017  . Ischemic cardiomyopathy 03/31/2017  . Marijuana use 03/31/2017  . Prediabetes 03/31/2017  . Aortic valve insufficiency 12/28/2016  . Mitral valve regurgitation  12/28/2016  . Obesity 12/28/2016  . Aortic valve stenosis, nonrheumatic   . Essential hypertension      Current Outpatient Medications on File Prior to Visit  Medication Sig Dispense Refill  . amoxicillin (AMOXIL) 500 MG tablet TAKE 4 TABS PO 30 MINS BEFORE PROCEDURE 4 tablet 0  . aspirin EC 81 MG tablet Take 1 tablet (81 mg total) by mouth 2 (two) times daily. 84 tablet 0  . aspirin EC 81 MG tablet Take 1 tablet (81 mg total) by mouth 2 (two) times daily. 84 tablet 0  . carvedilol (COREG) 3.125 MG tablet Take 1 tablet (3.125 mg total) by mouth 2 (two) times daily. 180 tablet 3  . lidocaine (LIDODERM) 5 % PLACE 1 PATCH ONTO THE SKIN DAILY. REMOVE & DISCARD PATCH WITHIN 12 HOURS OR AS DIRECTED BY DOCTOR. (Patient taking differently: Place 1-2 patches onto the skin daily. ) 30 patch 2  . lisinopril (ZESTRIL) 10 MG tablet Take 1 tablet (10 mg total) by mouth 2 (two) times daily. 180 tablet 1  . methocarbamol (ROBAXIN) 500 MG tablet Take 1 tablet (500 mg total) by mouth 2 (two) times daily as needed for muscle spasms. 20 tablet 0  . methocarbamol (ROBAXIN) 750 MG tablet Take 1 tablet (750 mg total) by mouth 2 (two) times daily as needed for muscle spasms. 60 tablet 0  . mupirocin ointment (BACTROBAN) 2 % Apply 1 application topically 2 (two) times daily. 22 g 0  . ondansetron (ZOFRAN) 4 MG tablet Take 1-2 tablets (4-8 mg total) by mouth every 8 (eight)  hours as needed for nausea or vomiting. 40 tablet 0  . oxyCODONE-acetaminophen (PERCOCET) 5-325 MG tablet Take 1-2 tablets by mouth every 8 (eight) hours as needed for severe pain. 30 tablet 0  . potassium chloride (K-DUR) 10 MEQ tablet Take 1 tablet (10 mEq total) by mouth daily. 90 tablet 1  . predniSONE (STERAPRED UNI-PAK 21 TAB) 10 MG (21) TBPK tablet Take as directed 21 tablet 0  . promethazine (PHENERGAN) 25 MG tablet Take 1 tablet (25 mg total) by mouth every 6 (six) hours as needed for nausea. 30 tablet 1  . senna-docusate (SENOKOT S) 8.6-50  MG tablet Take 1 tablet by mouth at bedtime as needed. 30 tablet 1  . senna-docusate (SENOKOT S) 8.6-50 MG tablet Take 1-2 tablets by mouth at bedtime as needed. 30 tablet 1  . sulfamethoxazole-trimethoprim (BACTRIM DS) 800-160 MG tablet Take 1 tablet by mouth 2 (two) times daily. 20 tablet 0   No current facility-administered medications on file prior to visit.     No Known Allergies  Social History   Socioeconomic History  . Marital status: Single    Spouse name: Not on file  . Number of children: Not on file  . Years of education: Not on file  . Highest education level: Not on file  Occupational History  . Not on file  Social Needs  . Financial resource strain: Not on file  . Food insecurity    Worry: Not on file    Inability: Not on file  . Transportation needs    Medical: Not on file    Non-medical: Not on file  Tobacco Use  . Smoking status: Former Research scientist (life sciences)  . Smokeless tobacco: Never Used  . Tobacco comment: Smoked lightly for approx 20 years, quit 1990s  Substance and Sexual Activity  . Alcohol use: Yes    Comment: 1-2 mixed drinks per week  . Drug use: Yes    Types: Marijuana    Comment: occasional  . Sexual activity: Not on file  Lifestyle  . Physical activity    Days per week: Not on file    Minutes per session: Not on file  . Stress: Not on file  Relationships  . Social Herbalist on phone: Not on file    Gets together: Not on file    Attends religious service: Not on file    Active member of club or organization: Not on file    Attends meetings of clubs or organizations: Not on file    Relationship status: Not on file  . Intimate partner violence    Fear of current or ex partner: Not on file    Emotionally abused: Not on file    Physically abused: Not on file    Forced sexual activity: Not on file  Other Topics Concern  . Not on file  Social History Narrative   Lives Home alone. Sister makes all health related decisions. Declined  Advanced directive at this time    Family History  Problem Relation Age of Onset  . CAD Father        MI/CABG age 53    Past Surgical History:  Procedure Laterality Date  . CORONARY STENT INTERVENTION N/A 12/29/2016   Procedure: CORONARY STENT INTERVENTION;  Surgeon: Jettie Booze, MD;  Location: [place] [facility];  Service: Cardiovascular;  Laterality: N/A;  . RIGHT/LEFT HEART CATH AND CORONARY ANGIOGRAPHY N/A 12/29/2016   Procedure: RIGHT/LEFT HEART CATH AND CORONARY ANGIOGRAPHY;  Surgeon: Larae Grooms  S, MD;  Location: [place] [facility];  Service: Cardiovascular;  Laterality: N/A;  . TONSILLECTOMY    . TOTAL HIP ARTHROPLASTY Right 04/26/2018   Procedure: RIGHT TOTAL HIP ARTHROPLASTY ANTERIOR APPROACH;  Surgeon: Leandrew Koyanagi, MD;  Location: [place];  Service: Orthopedics;  Laterality: Right;  . TOTAL HIP ARTHROPLASTY Left 11/15/2018  . TOTAL HIP ARTHROPLASTY Left 11/15/2018   Procedure: LEFT TOTAL HIP ARTHROPLASTY ANTERIOR APPROACH;  Surgeon: Leandrew Koyanagi, MD;  Location: [place];  Service: Orthopedics;  Laterality: Left;    ROS: Review of Systems Negative except as stated above  PHYSICAL EXAM: BP 125/78   Pulse 60   Temp 98.9 F (37.2 C) (Oral)   Resp 16   Wt 227 lb 6.4 oz (103.1 kg)   SpO2 96%   BMI 30.00 kg/m   Wt Readings from Last 3 Encounters:  02/25/19 227 lb 6.4 oz (103.1 kg)  02/24/19 224 lb (101.6 kg)  12/28/18 205 lb (93 kg)    Physical Exam Constitutional:      Appearance: Normal appearance.  Neck:     Musculoskeletal: Neck supple.     Vascular: No carotid bruit.  Cardiovascular:     Rate and Rhythm: Normal rate and regular rhythm.     Comments: Musical systolic murmur heard best along sternal border.  No LE edema Pulmonary:     Effort: Pulmonary effort is normal.     Breath sounds: Normal breath sounds.  Musculoskeletal:     Comments: LT shoulder:  No point tenderness.  Neurological:     Mental Status: He is alert.      CMP Latest  Ref Rng & Units 02/24/2019 11/22/2018 11/16/2018  Glucose 65 - 99 mg/dL 100(H) 118(H) 123(H)  BUN 8 - 27 mg/dL '17 16 11  ' Creatinine 0.76 - 1.27 mg/dL 0.86 0.76 0.67  Sodium 134 - 144 mmol/L 140 136 140  Potassium 3.5 - 5.2 mmol/L 4.8 4.2 3.9  Chloride 96 - 106 mmol/L 102 102 106  CO2 20 - 29 mmol/L '25 24 25  ' Calcium 8.6 - 10.2 mg/dL 9.4 8.8(L) 8.4(L)  Total Protein 6.0 - 8.5 g/dL 7.2 6.1(L) -  Total Bilirubin 0.0 - 1.2 mg/dL 0.3 0.8 -  Alkaline Phos 39 - 117 IU/L 154(H) 164(H) -  AST 0 - 40 IU/L 18 24 -  ALT 0 - 44 IU/L 25 69(H) -   Lipid Panel     Component Value Date/Time   CHOL 168 02/24/2019 1035   TRIG 154 (H) 02/24/2019 1035   HDL 52 02/24/2019 1035   CHOLHDL 3.2 02/24/2019 1035   CHOLHDL 4.6 12/27/2016 0238   VLDL 18 12/27/2016 0238   LDLCALC 89 02/24/2019 1035    CBC    Component Value Date/Time   WBC 7.7 11/22/2018 1520   RBC 4.20 (L) 11/22/2018 1520   HGB 13.3 11/22/2018 1520   HGB 15.7 01/05/2018 1359   HCT 39.9 11/22/2018 1520   HCT 46.5 01/05/2018 1359   PLT 204 11/22/2018 1520   PLT 188 01/05/2018 1359   MCV 95.0 11/22/2018 1520   MCV 92 01/05/2018 1359   MCH 31.7 11/22/2018 1520   MCHC 33.3 11/22/2018 1520   RDW 14.0 11/22/2018 1520   RDW 12.9 01/05/2018 1359   LYMPHSABS 1.9 11/22/2018 1520   MONOABS 0.6 11/22/2018 1520   EOSABS 0.5 11/22/2018 1520   BASOSABS 0.0 11/22/2018 1520    ASSESSMENT AND PLAN:  1. Essential hypertension At goal Cont Coreg/Lisnopril and DASH  2.  Primary osteoarthritis of left shoulder Pt reports good relief of pain with Lido patches when used before for arthritis pain in hips prior to THR.  Had a few left and started using on shoulder. Told pt if his insurance does not cover, he can try Salon Pos OTC - lidocaine (LIDODERM) 5 %; Place 1-2 patches onto the skin daily. Remove & Discard patch within 12 hours or as directed by MD  Dispense: 30 patch; Refill: 1  3. Class 1 obesity due to excess calories with serious  comorbidity and body mass index (BMI) of 30.0 to 30.9 in adult -dietary counseling given Encourage him to start walking for exercise  4. Statin intolerance   5. Coronary artery disease involving native coronary artery of native heart without angina pectoris stable  6. Need for influenza vaccination given   Patient was given the opportunity to ask questions.  Patient verbalized understanding of the plan and was able to repeat key elements of the plan.   No orders of the defined types were placed in this encounter.    Requested Prescriptions    No prescriptions requested or ordered in this encounter    No follow-ups on file.  Karle Plumber, MD, FACP

## 2019-02-25 NOTE — Patient Instructions (Signed)
Influenza Virus Vaccine injection (Fluarix) What is this medicine? INFLUENZA VIRUS VACCINE (in floo EN zuh VAHY ruhs vak SEEN) helps to reduce the risk of getting influenza also known as the flu. This medicine may be used for other purposes; ask your health care provider or pharmacist if you have questions. COMMON BRAND NAME(S): Fluarix, Fluzone What should I tell my health care provider before I take this medicine? They need to know if you have any of these conditions:  bleeding disorder like hemophilia  fever or infection  Guillain-Barre syndrome or other neurological problems  immune system problems  infection with the human immunodeficiency virus (HIV) or AIDS  low blood platelet counts  multiple sclerosis  an unusual or allergic reaction to influenza virus vaccine, eggs, chicken proteins, latex, gentamicin, other medicines, foods, dyes or preservatives  pregnant or trying to get pregnant  breast-feeding How should I use this medicine? This vaccine is for injection into a muscle. It is given by a health care professional. A copy of Vaccine Information Statements will be given before each vaccination. Read this sheet carefully each time. The sheet may change frequently. Talk to your pediatrician regarding the use of this medicine in children. Special care may be needed. Overdosage: If you think you have taken too much of this medicine contact a poison control center or emergency room at once. NOTE: This medicine is only for you. Do not share this medicine with others. What if I miss a dose? This does not apply. What may interact with this medicine?  chemotherapy or radiation therapy  medicines that lower your immune system like etanercept, anakinra, infliximab, and adalimumab  medicines that treat or prevent blood clots like warfarin  phenytoin  steroid medicines like prednisone or cortisone  theophylline  vaccines This list may not describe all possible  interactions. Give your health care provider a list of all the medicines, herbs, non-prescription drugs, or dietary supplements you use. Also tell them if you smoke, drink alcohol, or use illegal drugs. Some items may interact with your medicine. What should I watch for while using this medicine? Report any side effects that do not go away within 3 days to your doctor or health care professional. Call your health care provider if any unusual symptoms occur within 6 weeks of receiving this vaccine. You may still catch the flu, but the illness is not usually as bad. You cannot get the flu from the vaccine. The vaccine will not protect against colds or other illnesses that may cause fever. The vaccine is needed every year. What side effects may I notice from receiving this medicine? Side effects that you should report to your doctor or health care professional as soon as possible:  allergic reactions like skin rash, itching or hives, swelling of the face, lips, or tongue Side effects that usually do not require medical attention (report to your doctor or health care professional if they continue or are bothersome):  fever  headache  muscle aches and pains  pain, tenderness, redness, or swelling at site where injected  weak or tired This list may not describe all possible side effects. Call your doctor for medical advice about side effects. You may report side effects to FDA at 1-800-FDA-1088. Where should I keep my medicine? This vaccine is only given in a clinic, pharmacy, doctor's office, or other health care setting and will not be stored at home. NOTE: This sheet is a summary. It may not cover all possible information. If you have questions   about this medicine, talk to your doctor, pharmacist, or health care provider.  2020 Elsevier/Gold Standard (2007-10-27 09:30:40)  

## 2019-02-25 NOTE — Telephone Encounter (Signed)
Patient made aware of results and verbalized understanding.  He stated that he was taken off of the Atorvastatin due to elevated liver enzymes. He would rather not restart it back again but would rather work on his diet and exercise. He stated that he has been eating a lot of hamburgers and fast food lately. He has been advised that we will repeat the fasting Lipid in 3 months.  He has an appointment today with his PCP.

## 2019-02-25 NOTE — Telephone Encounter (Signed)
I see. Recheck in 3 months lipids and CMET then. If LDL still above 70 we can talk about Repatha/Praluent - no liver problems with those.

## 2019-02-26 ENCOUNTER — Encounter: Payer: Self-pay | Admitting: Cardiovascular Disease

## 2019-03-07 MED FILL — CARVEDILOL 3.125 MG TABLET: 3.125 | 90 days supply | Qty: 180 | Fill #1

## 2019-03-07 MED FILL — LISINOPRIL 10 MG TABS: 10 | 30 days supply | Qty: 60 | Fill #2

## 2019-03-07 MED FILL — POTASSIUM CHLORIDE ER 10 ME: 10 | 30 days supply | Qty: 30 | Fill #0

## 2019-04-04 MED FILL — POTASSIUM CHLORIDE ER 10 ME: 10 | 30 days supply | Qty: 30 | Fill #1

## 2019-04-04 MED FILL — LISINOPRIL 10 MG TABS: 10 | 30 days supply | Qty: 60 | Fill #3

## 2019-04-19 ENCOUNTER — Ambulatory Visit: Payer: Medicaid Other | Admitting: Internal Medicine

## 2019-04-21 ENCOUNTER — Ambulatory Visit: Payer: Medicaid Other | Admitting: Internal Medicine

## 2019-04-26 ENCOUNTER — Telehealth: Payer: Self-pay | Admitting: Orthopaedic Surgery

## 2019-04-26 ENCOUNTER — Ambulatory Visit (HOSPITAL_COMMUNITY): Payer: Medicaid Other | Attending: Cardiology

## 2019-04-26 ENCOUNTER — Other Ambulatory Visit: Payer: Self-pay | Admitting: Physician Assistant

## 2019-04-26 ENCOUNTER — Other Ambulatory Visit: Payer: Self-pay

## 2019-04-26 DIAGNOSIS — I350 Nonrheumatic aortic (valve) stenosis: Secondary | ICD-10-CM | POA: Diagnosis not present

## 2019-04-26 MED ORDER — AMOXICILLIN 500 MG PO TABS: ORAL_TABLET | ORAL | 0 refills | Status: DC

## 2019-04-26 MED FILL — AMOXICILLIN 500 MG CAPS: 500 | 3 days supply | Qty: 12 | Fill #0

## 2019-04-26 NOTE — Telephone Encounter (Signed)
Just sent in

## 2019-04-26 NOTE — Telephone Encounter (Signed)
Patient called has had a hip replacement and needing a antibiotic called in. Hip 2020

## 2019-04-27 ENCOUNTER — Other Ambulatory Visit: Payer: Self-pay | Admitting: *Deleted

## 2019-04-27 DIAGNOSIS — I350 Nonrheumatic aortic (valve) stenosis: Secondary | ICD-10-CM

## 2019-04-27 NOTE — Telephone Encounter (Signed)
Patient aware.

## 2019-05-03 ENCOUNTER — Ambulatory Visit: Payer: Medicaid Other | Admitting: Orthopaedic Surgery

## 2019-05-04 MED FILL — LISINOPRIL 10 MG TABS: 10 | 30 days supply | Qty: 60 | Fill #4

## 2019-05-10 MED FILL — AMOXICILLIN 500 MG CAPS: 500 | 3 days supply | Qty: 12 | Fill #0

## 2019-05-23 MED FILL — POTASSIUM CHLORIDE ER 10 ME: 10 | 90 days supply | Qty: 90 | Fill #2

## 2019-05-24 ENCOUNTER — Ambulatory Visit (INDEPENDENT_AMBULATORY_CARE_PROVIDER_SITE_OTHER): Payer: Medicaid Other

## 2019-05-24 ENCOUNTER — Ambulatory Visit (INDEPENDENT_AMBULATORY_CARE_PROVIDER_SITE_OTHER): Payer: Medicaid Other | Admitting: Orthopaedic Surgery

## 2019-05-24 ENCOUNTER — Other Ambulatory Visit: Payer: Self-pay

## 2019-05-24 ENCOUNTER — Encounter: Payer: Self-pay | Admitting: Orthopaedic Surgery

## 2019-05-24 ENCOUNTER — Ambulatory Visit: Payer: Self-pay

## 2019-05-24 DIAGNOSIS — Z96642 Presence of left artificial hip joint: Secondary | ICD-10-CM

## 2019-05-24 DIAGNOSIS — Z96641 Presence of right artificial hip joint: Secondary | ICD-10-CM | POA: Diagnosis not present

## 2019-05-24 MED ORDER — METHOCARBAMOL 500 MG PO TABS: 500.0000 mg | ORAL_TABLET | Freq: Two times a day (BID) | ORAL | 0 refills | Status: DC | PRN

## 2019-05-24 MED ORDER — PREDNISONE 10 MG (21) PO TBPK: ORAL_TABLET | ORAL | 0 refills | Status: DC

## 2019-05-24 NOTE — Progress Notes (Signed)
Office Visit Note   Patient: Joshua [name]           Date of Birth: Jun 29, 1960           MRN: [id number] Visit Date: 05/24/2019              Requested by: Ladell Pier, MD [address] [place],  [place] [address] PCP: Ladell Pier, MD   Assessment & Plan: Visit Diagnoses:  1. Status post total replacement of left hip   2. Status post total hip replacement, right     Plan: Impression is status post bilateral total hip replacements doing well.  We will follow up with [place] in 1 years time for repeat evaluation and x-rays to include AP pelvis bilateral lateral hips.  Dental prophylaxis reinforced.  Call with concerns or questions.  Follow-Up Instructions: Return in about 1 year (around 05/23/2020) for bilateral total hip replacement f/u.   Orders:  Orders Placed This Encounter  Procedures  . XR HIP UNILAT W OR W/O PELVIS 2-3 VIEWS RIGHT  . XR HIP UNILAT W OR W/O PELVIS 2-3 VIEWS LEFT   Meds ordered this encounter  Medications  . predniSONE (STERAPRED UNI-PAK 21 TAB) 10 MG (21) TBPK tablet    Sig: Take as directed    Dispense:  21 tablet    Refill:  0  . methocarbamol (ROBAXIN) 500 MG tablet    Sig: Take 1 tablet (500 mg total) by mouth 2 (two) times daily as needed.    Dispense:  20 tablet    Refill:  0      Procedures: No procedures performed   Clinical Data: No additional findings.   Subjective: Chief Complaint  Patient presents with  . Left Hip - Pain    HPI patient is a 59 year old gentleman who comes in today for follow-up of bilateral total hip replacement right 04/26/2018 and left 11/15/2018.  He is doing very well in regards to both hips.  He has been working more as his hips and been feeling good which is aggravated his back.  Review of Systems as detailed in HPI.  All others reviewed and are negative.   Objective: Vital Signs: There were no vitals taken for this visit.  Physical Exam well-developed well-nourished gentleman in no acute  distress.  Alert oriented x3.  Ortho Exam examination of both hips reveals a negative logroll.  Full hip range of motion and strength.  He is neurovascular intact distally.  Specialty Comments:  No specialty comments available.  Imaging: XR HIP UNILAT W OR W/O PELVIS 2-3 VIEWS LEFT  Result Date: 05/24/2019 Stable total hip replacement without complication  XR HIP UNILAT W OR W/O PELVIS 2-3 VIEWS RIGHT  Result Date: 05/24/2019 Stable right total hip replacement without complication.    PMFS History: Patient Active Problem List   Diagnosis Date Noted  . Statin intolerance 12/10/2018  . 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/15/2018  . Chronic left shoulder pain 06/08/2018  . Status post total hip replacement, right 04/26/2018  . Mild dilation of ascending aorta (HCC) 12/26/2017  . Dyslipidemia 12/26/2017  . Bilateral carotid bruits 12/26/2017  . Nocturnal hypoxemia 06/29/2017  . Primary osteoarthritis of both hips 06/29/2017  . Coronary artery disease involving native coronary artery of native heart without angina pectoris 03/31/2017  . Ischemic cardiomyopathy 03/31/2017  . Marijuana use 03/31/2017  . Prediabetes 03/31/2017  . Aortic valve insufficiency 12/28/2016  . Mitral valve regurgitation 12/28/2016  . Obesity 12/28/2016  .  Aortic valve stenosis, nonrheumatic   . Essential hypertension    Past Medical History:  Diagnosis Date  . Aortic stenosis   . CAD (coronary artery disease), native coronary artery 12/26/2016   DES LAD & RCA, EF 25-30%  . CHF (congestive heart failure) ([place])   . Dyslipidemia   . Hypertension   . Obesity   . Osteoarthritis   . Pre-diabetes    a. prior A1C 5.6, states he was on meds for this at one point.    Family History  Problem Relation Age of Onset  . CAD Father        MI/CABG age 7    Past Surgical History:  Procedure Laterality Date  . CORONARY STENT INTERVENTION N/A 12/29/2016   Procedure: CORONARY STENT INTERVENTION;   Surgeon: Jettie Booze, MD;  Location: [facility];  Service: Cardiovascular;  Laterality: N/A;  . RIGHT/LEFT HEART CATH AND CORONARY ANGIOGRAPHY N/A 12/29/2016   Procedure: RIGHT/LEFT HEART CATH AND CORONARY ANGIOGRAPHY;  Surgeon: Jettie Booze, MD;  Location: [facility];  Service: Cardiovascular;  Laterality: N/A;  . TONSILLECTOMY    . TOTAL HIP ARTHROPLASTY Right 04/26/2018   Procedure: RIGHT TOTAL HIP ARTHROPLASTY ANTERIOR APPROACH;  Surgeon: Leandrew Koyanagi, MD;  Location: [facility];  Service: Orthopedics;  Laterality: Right;  . TOTAL HIP ARTHROPLASTY Left 11/15/2018  . TOTAL HIP ARTHROPLASTY Left 11/15/2018   Procedure: LEFT TOTAL HIP ARTHROPLASTY ANTERIOR APPROACH;  Surgeon: Leandrew Koyanagi, MD;  Location: [facility];  Service: Orthopedics;  Laterality: Left;   Social History   Occupational History  . Not on file  Tobacco Use  . Smoking status: Former Research scientist (life sciences)  . Smokeless tobacco: Never Used  . Tobacco comment: Smoked lightly for approx 20 years, quit 1990s  Substance and Sexual Activity  . Alcohol use: Yes    Comment: 1-2 mixed drinks per week  . Drug use: Yes    Types: Marijuana    Comment: occasional  . Sexual activity: Not on file

## 2019-05-26 ENCOUNTER — Telehealth: Payer: Self-pay | Admitting: Orthopaedic Surgery

## 2019-05-26 MED FILL — LISINOPRIL 10 MG TABS: 10 | 30 days supply | Qty: 60 | Fill #5

## 2019-05-26 NOTE — Telephone Encounter (Signed)
Pt called in requesting a call back in regards to some prescriptions that were suppose to be filled at his last appt and is having some issues getting it refilled.    [phone]

## 2019-05-27 ENCOUNTER — Other Ambulatory Visit: Payer: Self-pay | Admitting: Physician Assistant

## 2019-05-27 ENCOUNTER — Telehealth: Payer: Self-pay | Admitting: Orthopaedic Surgery

## 2019-05-27 ENCOUNTER — Other Ambulatory Visit: Payer: Self-pay

## 2019-05-27 MED ORDER — PREDNISONE 10 MG (21) PO TBPK: ORAL_TABLET | ORAL | 0 refills | Status: DC

## 2019-05-27 MED ORDER — METHOCARBAMOL 500 MG PO TABS: 500.0000 mg | ORAL_TABLET | Freq: Two times a day (BID) | ORAL | 0 refills | Status: DC | PRN

## 2019-05-27 NOTE — Telephone Encounter (Signed)
I sent in robaxin and sterapred.  Cannot write narcotics.  Will you see what he was not able to get filled?

## 2019-05-27 NOTE — Telephone Encounter (Signed)
Would like it sent into [facility] [place], [place].  Sent both.

## 2019-05-30 ENCOUNTER — Telehealth: Payer: Self-pay | Admitting: Orthopedic Surgery

## 2019-05-30 NOTE — Telephone Encounter (Signed)
[facility] [place]([facility]) NOT [place].  Please have the muscle relaxer transfer, [facility] is telling [facility] be [facility] until [facility] days  ([id number]  Please call patient to discuss. He is very confused.

## 2019-05-30 NOTE — Telephone Encounter (Signed)
RX cancelled at [facility]. [facility] will be [facility] [place]. Can you please let him know. Thank you.

## 2019-06-01 NOTE — Telephone Encounter (Signed)
Called and left vm  °

## 2019-07-09 ENCOUNTER — Other Ambulatory Visit: Payer: Self-pay | Admitting: Internal Medicine

## 2019-07-09 DIAGNOSIS — I10 Essential (primary) hypertension: Secondary | ICD-10-CM

## 2019-07-12 NOTE — Telephone Encounter (Signed)
disregard

## 2019-07-12 NOTE — Telephone Encounter (Signed)
1) Medication(s) Requested (by name):lisinopril (ZESTRIL) 10 MG tablet [id number]    2) [facility] [address] [facility] [place]   3) Special Requests:   Approved medications will be sent to the pharmacy, we will reach out if there is an issue.  Requests made after 3pm may not be addressed until the following business day!  If a patient is unsure of the name of the medication(s) please note and ask patient to call back when they are able to provide all info, do not send to responsible party until all information is available!

## 2019-08-08 DIAGNOSIS — Z23 Encounter for immunization: Secondary | ICD-10-CM | POA: Diagnosis not present

## 2019-08-09 ENCOUNTER — Ambulatory Visit: Payer: Medicaid Other | Attending: Family | Admitting: Family

## 2019-08-09 ENCOUNTER — Ambulatory Visit: Payer: Medicaid Other | Admitting: Internal Medicine

## 2019-08-09 ENCOUNTER — Other Ambulatory Visit: Payer: Self-pay

## 2019-08-09 DIAGNOSIS — Z955 Presence of coronary angioplasty implant and graft: Secondary | ICD-10-CM | POA: Insufficient documentation

## 2019-08-09 DIAGNOSIS — R7303 Prediabetes: Secondary | ICD-10-CM | POA: Diagnosis not present

## 2019-08-09 DIAGNOSIS — E785 Hyperlipidemia, unspecified: Secondary | ICD-10-CM | POA: Insufficient documentation

## 2019-08-09 DIAGNOSIS — Z79899 Other long term (current) drug therapy: Secondary | ICD-10-CM | POA: Insufficient documentation

## 2019-08-09 DIAGNOSIS — Z7982 Long term (current) use of aspirin: Secondary | ICD-10-CM | POA: Insufficient documentation

## 2019-08-09 DIAGNOSIS — I10 Essential (primary) hypertension: Secondary | ICD-10-CM

## 2019-08-09 DIAGNOSIS — I350 Nonrheumatic aortic (valve) stenosis: Secondary | ICD-10-CM | POA: Diagnosis not present

## 2019-08-09 DIAGNOSIS — Z7901 Long term (current) use of anticoagulants: Secondary | ICD-10-CM | POA: Diagnosis not present

## 2019-08-09 DIAGNOSIS — E669 Obesity, unspecified: Secondary | ICD-10-CM | POA: Diagnosis not present

## 2019-08-09 DIAGNOSIS — I509 Heart failure, unspecified: Secondary | ICD-10-CM | POA: Diagnosis not present

## 2019-08-09 DIAGNOSIS — I2510 Atherosclerotic heart disease of native coronary artery without angina pectoris: Secondary | ICD-10-CM | POA: Insufficient documentation

## 2019-08-09 DIAGNOSIS — M1990 Unspecified osteoarthritis, unspecified site: Secondary | ICD-10-CM | POA: Diagnosis not present

## 2019-08-09 DIAGNOSIS — I110 Hypertensive heart disease with heart failure: Secondary | ICD-10-CM | POA: Diagnosis not present

## 2019-08-09 DIAGNOSIS — Z760 Encounter for issue of repeat prescription: Secondary | ICD-10-CM | POA: Diagnosis not present

## 2019-08-09 NOTE — Progress Notes (Signed)
Virtual Visit via Telephone Note  I connected with Joshua [name], on 08/09/2019 at 4:09 PM by telephone due to the COVID-19 pandemic and verified that I am speaking with the correct person using two identifiers.  Due to current restrictions/limitations of in-office visits due to the COVID-19 pandemic, this scheduled clinical appointment was converted to a telehealth visit.   Consent: I discussed the limitations, risks, security and privacy concerns of performing an evaluation and management service by telephone and the availability of in person appointments. I also discussed with the patient that there may be a patient responsible charge related to this service. The patient expressed understanding and agreed to proceed.  Location of Patient: Home  Location of Provider: [place] [facility] [place]   Persons participating in Telemedicine visit: Joshua Gerbino Soper Greer Wainright Minette Brine, NP Joshua [name], [place]  History of Present Illness: Joshua [name] is a 59 year old male with history of essential hypertension, mild dilation of ascending aorta, prediabetes, dyslipidemia, and obesity who presents today for medication refill.  1. HYPERTENSION FOLLOW-UP:  Currently taking: see medication list Med Adherence: [x]  Yes    []  No Medication side effects: []  Yes    [x]  No Adherence with salt restriction: [x]  Yes    []  No Exercise: Yes [x]  No []   Home Monitoring?: [x]  Yes    []  No Monitoring Frequency: []  Yes    [x]  No Home BP results range: [x]  Yes    []  No 108/70 Smoking []  Yes [x]  No SOB? []  Yes    [x]  No Chest Pain?: []  Yes    [x]  No Leg swelling?: []  Yes    [x]  No Headaches?: []  Yes    [x]  No Dizziness? []  Yes    [x]  No Comments: Patient requesting an additional month supply of Lisinopril. Reports the prescription for Carvedilol was sent to the pharmacy for a 3 month supply. He is concerned because Lisinopril was only refilled for a 1 month supply and he needs more.  Requesting medication be sent to [facility] in [place].   Last visit November 2020 with Joshua [name]. During that encounter blood pressure was at goal and patient was continued on Carvedilol and Lisinopril.  Past Medical History:  Diagnosis Date  . Aortic stenosis   . CAD (coronary artery disease), native coronary artery 12/26/2016   DES LAD & RCA, EF 25-30%  . CHF (congestive heart failure) ([place])   . Dyslipidemia   . Hypertension   . Obesity   . Osteoarthritis   . Pre-diabetes    a. prior A1C 5.6, states he was on meds for this at one point.   No Known Allergies  Current Outpatient Medications on File Prior to Visit  Medication Sig Dispense Refill  . amoxicillin (AMOXIL) 500 MG tablet Take 4 tabs one hour prior to dental work 12 tablet 0  . aspirin EC 81 MG tablet Take 1 tablet (81 mg total) by mouth 2 (two) times daily. 84 tablet 0  . aspirin EC 81 MG tablet Take 1 tablet (81 mg total) by mouth 2 (two) times daily. 84 tablet 0  . carvedilol (COREG) 3.125 MG tablet Take 1 tablet (3.125 mg total) by mouth 2 (two) times daily. 180 tablet 3  . lidocaine (LIDODERM) 5 % Place 1-2 patches onto the skin daily. Remove & Discard patch within 12 hours or as directed by MD 30 patch 1  . lisinopril (ZESTRIL) 10 MG tablet Take 1 tablet (10 mg total) by mouth 2 (two) times  daily. Must have office visit for refills 60 tablet 0  . methocarbamol (ROBAXIN) 500 MG tablet Take 1 tablet (500 mg total) by mouth 2 (two) times daily as needed. 20 tablet 0  . mupirocin ointment (BACTROBAN) 2 % Apply 1 application topically 2 (two) times daily. 22 g 0  . ondansetron (ZOFRAN) 4 MG tablet Take 1-2 tablets (4-8 mg total) by mouth every 8 (eight) hours as needed for nausea or vomiting. 40 tablet 0  . potassium chloride (KLOR-CON) 10 MEQ tablet TAKE 1 TABLET BY MOUTH EVERY DAY 30 tablet 0  . predniSONE (STERAPRED UNI-PAK 21 TAB) 10 MG (21) TBPK tablet Take as directed 21 tablet 0  . senna-docusate (SENOKOT  S) 8.6-50 MG tablet Take 1 tablet by mouth at bedtime as needed. 30 tablet 1  . senna-docusate (SENOKOT S) 8.6-50 MG tablet Take 1-2 tablets by mouth at bedtime as needed. 30 tablet 1   No current facility-administered medications on file prior to visit.    Observations/Objective: Alert and oriented x 3. Not in acute distress. Physical examination not completed as this is a telemedicine visit.  Assessment and Plan: 1. Essential hypertension: -Continue Carvedilol as prescribed. Patient reports he has enough refills left of this medication to last him until his next visit with his primary physician. -Will refill Lisinopril for a 1 month supply. Informed patient that he will need to see his primary physician for medication refills following this encounter as he will need to be seen in-person for an office visit and labs. Patient verbalized understanding. - lisinopril (ZESTRIL) 10 MG tablet; Take 1 tablet (10 mg total) by mouth 2 (two) times daily. Must have office visit for refills  Dispense: 60 tablet; Refill: 0  Follow Up Instructions: Follow-up with primary physician as needed.   Patient was given clear instructions to go to Emergency Department or return to medical center if symptoms don't improve, worsen, or new problems develop.The patient verbalized understanding.  I discussed the assessment and treatment plan with the patient. The patient was provided an opportunity to ask questions and all were answered. The patient agreed with the plan and demonstrated an understanding of the instructions.   The patient was advised to call back or seek an in-person evaluation if the symptoms worsen or if the condition fails to improve as anticipated.     I provided 12 minutes total of non-face-to-face time during this encounter including median intraservice time, reviewing previous notes, labs, imaging, medications, management and patient verbalized understanding.    Joshua Herter, NP  [facility] [place], [place]   08/09/2019, 4:09 PM

## 2019-08-10 ENCOUNTER — Other Ambulatory Visit: Payer: Self-pay | Admitting: Internal Medicine

## 2019-08-10 DIAGNOSIS — I10 Essential (primary) hypertension: Secondary | ICD-10-CM

## 2019-08-10 MED ORDER — LISINOPRIL 10 MG PO TABS: 10.0000 mg | ORAL_TABLET | Freq: Two times a day (BID) | ORAL | 0 refills | Status: DC

## 2019-08-10 NOTE — Patient Instructions (Signed)
Continue Carvedilol and Lisinopril as prescribed. Follow-up with primary physician as scheduled. Hypertension, Adult Hypertension is another name for high blood pressure. High blood pressure forces your heart to work harder to pump blood. This can cause problems over time. There are two numbers in a blood pressure reading. There is a top number (systolic) over a bottom number (diastolic). It is best to have a blood pressure that is below 120/80. Healthy choices can help lower your blood pressure, or you may need medicine to help lower it. What are the causes? The cause of this condition is not known. Some conditions may be related to high blood pressure. What increases the risk?  Smoking.  Having type 2 diabetes mellitus, high cholesterol, or both.  Not getting enough exercise or physical activity.  Being overweight.  Having too much fat, sugar, calories, or salt (sodium) in your diet.  Drinking too much alcohol.  Having long-term (chronic) kidney disease.  Having a family history of high blood pressure.  Age. Risk increases with age.  Race. You may be at higher risk if you are African American.  Gender. Men are at higher risk than women before age 76. After age 52, women are at higher risk than men.  Having obstructive sleep apnea.  Stress. What are the signs or symptoms?  High blood pressure may not cause symptoms. Very high blood pressure (hypertensive crisis) may cause: ? Headache. ? Feelings of worry or nervousness (anxiety). ? Shortness of breath. ? Nosebleed. ? A feeling of being sick to your stomach (nausea). ? Throwing up (vomiting). ? Changes in how you see. ? Very bad chest pain. ? Seizures. How is this treated?  This condition is treated by making healthy lifestyle changes, such as: ? Eating healthy foods. ? Exercising more. ? Drinking less alcohol.  Your health care provider may prescribe medicine if lifestyle changes are not enough to get your blood  pressure under control, and if: ? Your top number is above 130. ? Your bottom number is above 80.  Your personal target blood pressure may vary. Follow these instructions at home: Eating and drinking   If told, follow the DASH eating plan. To follow this plan: ? Fill one half of your plate at each meal with fruits and vegetables. ? Fill one fourth of your plate at each meal with whole grains. Whole grains include whole-wheat pasta, brown rice, and whole-grain bread. ? Eat or drink low-fat dairy products, such as skim milk or low-fat yogurt. ? Fill one fourth of your plate at each meal with low-fat (lean) proteins. Low-fat proteins include fish, chicken without skin, eggs, beans, and tofu. ? Avoid fatty meat, cured and processed meat, or chicken with skin. ? Avoid pre-made or processed food.  Eat less than 1,500 mg of salt each day.  Do not drink alcohol if: ? Your doctor tells you not to drink. ? You are pregnant, may be pregnant, or are planning to become pregnant.  If you drink alcohol: ? Limit how much you use to:  0-1 drink a day for women.  0-2 drinks a day for men. ? Be aware of how much alcohol is in your drink. In the [place] one drink equals one 12 oz bottle of beer (355 mL), one 5 oz glass of wine (148 mL), or one 1 oz glass of hard liquor (44 mL). Lifestyle   Work with your doctor to stay at a healthy weight or to lose weight. Ask your doctor what the best weight  is for you.  Get at least 30 minutes of exercise most days of the week. This may include walking, swimming, or biking.  Get at least 30 minutes of exercise that strengthens your muscles (resistance exercise) at least 3 days a week. This may include lifting weights or doing Pilates.  Do not use any products that contain nicotine or tobacco, such as cigarettes, e-cigarettes, and chewing tobacco. If you need help quitting, ask your doctor.  Check your blood pressure at home as told by your doctor.  Keep all  follow-up visits as told by your doctor. This is important. Medicines  Take over-the-counter and prescription medicines only as told by your doctor. Follow directions carefully.  Do not skip doses of blood pressure medicine. The medicine does not work as well if you skip doses. Skipping doses also puts you at risk for problems.  Ask your doctor about side effects or reactions to medicines that you should watch for. Contact a doctor if you:  Think you are having a reaction to the medicine you are taking.  Have headaches that keep coming back (recurring).  Feel dizzy.  Have swelling in your ankles.  Have trouble with your vision. Get help right away if you:  Get a very bad headache.  Start to feel mixed up (confused).  Feel weak or numb.  Feel faint.  Have very bad pain in your: ? Chest. ? Belly (abdomen).  Throw up more than once.  Have trouble breathing. Summary  Hypertension is another name for high blood pressure.  High blood pressure forces your heart to work harder to pump blood.  For most people, a normal blood pressure is less than 120/80.  Making healthy choices can help lower blood pressure. If your blood pressure does not get lower with healthy choices, you may need to take medicine. This information is not intended to replace advice given to you by your health care provider. Make sure you discuss any questions you have with your health care provider. Document Revised: 12/09/2017 Document Reviewed: 12/09/2017 Elsevier Patient Education  2020 [employer].

## 2019-09-11 ENCOUNTER — Other Ambulatory Visit: Payer: Self-pay | Admitting: Family

## 2019-09-11 DIAGNOSIS — I10 Essential (primary) hypertension: Secondary | ICD-10-CM

## 2019-09-13 ENCOUNTER — Other Ambulatory Visit: Payer: Self-pay | Admitting: Internal Medicine

## 2019-09-13 ENCOUNTER — Other Ambulatory Visit: Payer: Self-pay | Admitting: Primary Care

## 2019-09-13 DIAGNOSIS — I10 Essential (primary) hypertension: Secondary | ICD-10-CM

## 2019-09-13 NOTE — Telephone Encounter (Signed)
Patient has not been seen by you in sometime. Please refill if appropriate.

## 2019-09-14 ENCOUNTER — Other Ambulatory Visit: Payer: Self-pay | Admitting: Physician Assistant

## 2019-09-27 NOTE — Progress Notes (Addendum)
Patient ID: Joshua [name], male    DOB: [birth year]  MRN: [id number]  CC: Hypertension Follow-Up   Subjective: Joshua [name] is a 59 y.o. male with history of coronary artery disesae involving native coronary artery of native heart without angina pectoris, ischemic cardiomyopathy, essential hypertension, aortic valve stenosis nonrheumatic, aortic valve insufficiency, mitral valve regurgitation, mild dilation of ascending aorta, nocturnal hypoxemia, marijuana use, prediabetes, and  obesity who presents for hypertension follow-up.    1. HYPERTENSION FOLLOW-UP: Currently taking: see medication list Have you taken your blood pressure medication today: [] Yes [] No  Med Adherence: [x] Yes    [] No Medication side effects: [] Yes    [x] No Adherence with salt restriction: [x] Yes    [] No Exercise: Yes [] No [x]  Home Monitoring?: [x] Yes    [] No Monitoring Frequency: [] Yes    [x] No Home BP results range: [] Yes    [x] No  Smoking [] Yes [x] No  SOB? [] Yes    [x] No Chest Pain?: [] Yes    [x] No Leg swelling?: [] Yes    [x] No Headaches?: [] Yes    [x] No Dizziness? [] Yes    [x] No   Comments: Last visit 08/09/2019 with me. During that encounter Carvedilol and Lisinopril continued and to schedule in-person follow-up visit with primary physician in 1 month for refills and labs.  2. CAD FOLLOW-UP: Symptoms: Denies chest pain, palpitations, SOB Med Compliance: Current Medications:   Antiplatelet agent:  [x] yes, Aspirin   Beta Blocker: [] yes [x] no   Ace inhibitor:  [x] yes [] no   Statin  [] yes [x] no  Last visit 02/25/2019 with Dr Wynetta Emery. During that encounter patient was stable. Reports has a follow-up scheduled with Cardiology Fall 2021. Reports he sees them yearly.   Patient Active Problem List   Diagnosis Date Noted  . Statin intolerance 12/10/2018  . 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/15/2018  . Chronic left shoulder pain 06/08/2018  . Status post  total hip replacement, right 04/26/2018  . Mild dilation of ascending aorta ([facility]) 12/26/2017  . Dyslipidemia 12/26/2017  . Bilateral carotid bruits 12/26/2017  . Nocturnal hypoxemia 06/29/2017  . Primary osteoarthritis of both hips 06/29/2017  . Coronary artery disease involving native coronary artery of native heart without angina pectoris 03/31/2017  . Ischemic cardiomyopathy 03/31/2017  . Marijuana use 03/31/2017  . Prediabetes 03/31/2017  . Aortic valve insufficiency 12/28/2016  . Mitral valve regurgitation 12/28/2016  . Obesity 12/28/2016  . Aortic valve stenosis, nonrheumatic   . Essential hypertension      Current Outpatient Medications on File Prior to Visit  Medication Sig Dispense Refill  . amoxicillin (AMOXIL) 500 MG tablet Take 4 tabs one hour prior to dental work 12 tablet 0  . aspirin EC 81 MG tablet Take 1 tablet (81 mg total) by mouth 2 (two) times daily. 84 tablet 0  . aspirin EC 81 MG tablet Take 1 tablet (81 mg total) by mouth 2 (two) times daily. 84 tablet 0  . carvedilol (COREG) 3.125 MG tablet TAKE 1 TABLET BY MOUTH TWICE A DAY 180 tablet 0  . lidocaine (LIDODERM) 5 % Place 1-2 patches onto the skin daily. Remove & Discard patch within 12 hours or as directed by MD 30 patch 1  . lisinopril (ZESTRIL) 10 MG tablet Take 1 tablet (10 mg total) by mouth 2 (two) times daily. [place]  tablet 4  . methocarbamol (ROBAXIN) 500 MG tablet Take 1 tablet (500 mg total) by mouth 2 (two) times daily as needed. 20 tablet 0  . mupirocin ointment (BACTROBAN) 2 % Apply 1 application topically 2 (two) times daily. 22 g 0  . ondansetron (ZOFRAN) 4 MG tablet Take 1-2 tablets (4-8 mg total) by mouth every 8 (eight) hours as needed for nausea or vomiting. 40 tablet 0  . potassium chloride (KLOR-CON) 10 MEQ tablet TAKE 1 TABLET BY MOUTH EVERY DAY 30 tablet 0  . predniSONE (STERAPRED UNI-PAK 21 TAB) 10 MG (21) TBPK tablet Take as directed 21 tablet 0  . senna-docusate (SENOKOT S) 8.6-50 MG tablet  Take 1 tablet by mouth at bedtime as needed. 30 tablet 1  . senna-docusate (SENOKOT S) 8.6-50 MG tablet Take 1-2 tablets by mouth at bedtime as needed. 30 tablet 1   No current facility-administered medications on file prior to visit.    No Known Allergies  Social History   Socioeconomic History  . Marital status: Single    Spouse name: Not on file  . Number of children: Not on file  . Years of education: Not on file  . Highest education level: Not on file  Occupational History  . Not on file  Tobacco Use  . Smoking status: Former Research scientist (life sciences)  . Smokeless tobacco: Never Used  . Tobacco comment: Smoked lightly for approx 20 years, quit 1990s  Vaping Use  . Vaping Use: Never used  Substance and Sexual Activity  . Alcohol use: Yes    Comment: 1-2 mixed drinks per week  . Drug use: Yes    Types: Marijuana    Comment: occasional  . Sexual activity: Not on file  Other Topics Concern  . Not on file  Social History Narrative   Lives Home alone. Sister makes all health related decisions. Declined Advanced directive at this time   Social Determinants of Health   Financial Resource Strain:   . Difficulty of Paying Living Expenses:   Food Insecurity:   . Worried About Charity fundraiser in the Last Year:   . Arboriculturist in the Last Year:   Transportation Needs:   . Film/video editor (Medical):   Marland Kitchen Lack of Transportation (Non-Medical):   Physical Activity:   . Days of Exercise per Week:   . Minutes of Exercise per Session:   Stress:   . Feeling of Stress :   Social Connections:   . Frequency of Communication with Friends and Family:   . Frequency of Social Gatherings with Friends and Family:   . Attends Religious Services:   . Active Member of Clubs or Organizations:   . Attends Archivist Meetings:   Marland Kitchen Marital Status:   Intimate Partner Violence:   . Fear of Current or Ex-Partner:   . Emotionally Abused:   Marland Kitchen Physically Abused:   . Sexually Abused:      Family History  Problem Relation Age of Onset  . CAD Father        MI/CABG age 75    Past Surgical History:  Procedure Laterality Date  . CORONARY STENT INTERVENTION N/A 12/29/2016   Procedure: CORONARY STENT INTERVENTION;  Surgeon: Jettie Booze, MD;  Location: [place];  Service: Cardiovascular;  Laterality: N/A;  . RIGHT/LEFT HEART CATH AND CORONARY ANGIOGRAPHY N/A 12/29/2016   Procedure: RIGHT/LEFT HEART CATH AND CORONARY ANGIOGRAPHY;  Surgeon: Jettie Booze, MD;  Location: [place] [facility];  Service: Cardiovascular;  Laterality: N/A;  . TONSILLECTOMY    . TOTAL HIP ARTHROPLASTY Right 04/26/2018   Procedure: RIGHT TOTAL HIP ARTHROPLASTY ANTERIOR APPROACH;  Surgeon: Leandrew Koyanagi, MD;  Location: [facility];  Service: Orthopedics;  Laterality: Right;  . TOTAL HIP ARTHROPLASTY Left 11/15/2018  . TOTAL HIP ARTHROPLASTY Left 11/15/2018   Procedure: LEFT TOTAL HIP ARTHROPLASTY ANTERIOR APPROACH;  Surgeon: Leandrew Koyanagi, MD;  Location: [facility];  Service: Orthopedics;  Laterality: Left;    ROS: Review of Systems Negative except as stated above  PHYSICAL EXAM: Vitals with BMI 09/29/2019 02/25/2019 02/24/2019  Height - - 6' 1"  Weight 214 lbs 10 oz 227 lbs 6 oz 224 lbs  BMI - 93.79 02.40  Systolic 973 532 992  Diastolic 73 78 72  Pulse 61 60 54  SpO2- 96%, room air  Temperature- 97.32F, oral  Physical Exam General appearance - alert, well appearing, and in no distress and oriented to person, place, and time Mental status - alert, oriented to person, place, and time, normal mood, behavior, speech, dress, motor activity, and thought processes Neck - supple, no significant adenopathy Lymphatics - no palpable lymphadenopathy, no hepatosplenomegaly Chest - clear to auscultation, no wheezes, rales or rhonchi, symmetric air entry, no tachypnea, retractions or cyanosis Heart - normal rate, regular rhythm, normal S1, S2, no rubs, clicks or gallops, musical systolic murmur  heard best along sternal border.    Neurological - alert, oriented, normal speech, no focal findings or movement disorder noted, neck supple without rigidity, cranial nerves II through XII intact, funduscopic exam normal, discs flat and sharp, DTR's normal and symmetric, motor and sensory grossly normal bilaterally, normal muscle tone, no tremors, strength 5/5, Romberg sign negative, normal gait and station Musculoskeletal - no joint tenderness, deformity or swelling, no muscular tenderness noted, full range of motion without pain Extremities - peripheral pulses normal, no pedal edema, no clubbing or cyanosis  ASSESSMENT AND PLAN: 1. Essential hypertension: -Blood pressure controlled.  -Continue Lisinopril and Carvedilol as prescribed.  -CMP to evaluate kidney function, liver function, and electrolyte balance.  CBC to evaluate blood count. -Follow-up with primary physician in 3 months or sooner if needed. - CMP14+EGFR - CBC With Differential - lisinopril (ZESTRIL) 10 MG tablet; Take 1 tablet (10 mg total) by mouth 2 (two) times daily.  Dispense: 180 tablet; Refill: 0 - carvedilol (COREG) 3.125 MG tablet; Take 1 tablet (3.125 mg total) by mouth 2 (two) times daily.  Dispense: 180 tablet; Refill: 0  2. Coronary artery disease involving native coronary artery of native heart without angina pectoris: -Lipid panel to evaluate cholesterol. - Lipid panel  3. Diabetes mellitus screening: -A1C to screen for prediabetes/diabetes. - Hemoglobin A1c  Patient was given the opportunity to ask questions.  Patient verbalized understanding of the plan and was able to repeat key elements of the plan. Patient was given clear instructions to go to Emergency Department or return to medical center if symptoms don't improve, worsen, or new problems develop.The patient verbalized understanding.   Camillia Herter, NP

## 2019-09-29 ENCOUNTER — Encounter: Payer: Self-pay | Admitting: Family

## 2019-09-29 ENCOUNTER — Other Ambulatory Visit: Payer: Self-pay

## 2019-09-29 ENCOUNTER — Ambulatory Visit: Payer: Medicaid Other | Attending: Family | Admitting: Family

## 2019-09-29 VITALS — BP 127/73 | HR 61 | Temp 97.3°F | Resp 16 | Wt 214.6 lb

## 2019-09-29 DIAGNOSIS — Z955 Presence of coronary angioplasty implant and graft: Secondary | ICD-10-CM | POA: Insufficient documentation

## 2019-09-29 DIAGNOSIS — Z8249 Family history of ischemic heart disease and other diseases of the circulatory system: Secondary | ICD-10-CM | POA: Diagnosis not present

## 2019-09-29 DIAGNOSIS — Z7982 Long term (current) use of aspirin: Secondary | ICD-10-CM | POA: Diagnosis not present

## 2019-09-29 DIAGNOSIS — G8929 Other chronic pain: Secondary | ICD-10-CM | POA: Diagnosis not present

## 2019-09-29 DIAGNOSIS — I2510 Atherosclerotic heart disease of native coronary artery without angina pectoris: Secondary | ICD-10-CM | POA: Insufficient documentation

## 2019-09-29 DIAGNOSIS — E785 Hyperlipidemia, unspecified: Secondary | ICD-10-CM | POA: Insufficient documentation

## 2019-09-29 DIAGNOSIS — Z7952 Long term (current) use of systemic steroids: Secondary | ICD-10-CM | POA: Diagnosis not present

## 2019-09-29 DIAGNOSIS — I255 Ischemic cardiomyopathy: Secondary | ICD-10-CM | POA: Insufficient documentation

## 2019-09-29 DIAGNOSIS — R7303 Prediabetes: Secondary | ICD-10-CM | POA: Diagnosis not present

## 2019-09-29 DIAGNOSIS — Z96641 Presence of right artificial hip joint: Secondary | ICD-10-CM | POA: Insufficient documentation

## 2019-09-29 DIAGNOSIS — M25512 Pain in left shoulder: Secondary | ICD-10-CM | POA: Diagnosis not present

## 2019-09-29 DIAGNOSIS — Z87891 Personal history of nicotine dependence: Secondary | ICD-10-CM | POA: Diagnosis not present

## 2019-09-29 DIAGNOSIS — Z131 Encounter for screening for diabetes mellitus: Secondary | ICD-10-CM

## 2019-09-29 DIAGNOSIS — M160 Bilateral primary osteoarthritis of hip: Secondary | ICD-10-CM | POA: Diagnosis not present

## 2019-09-29 DIAGNOSIS — I10 Essential (primary) hypertension: Secondary | ICD-10-CM | POA: Diagnosis not present

## 2019-09-29 DIAGNOSIS — E669 Obesity, unspecified: Secondary | ICD-10-CM | POA: Insufficient documentation

## 2019-09-29 DIAGNOSIS — I340 Nonrheumatic mitral (valve) insufficiency: Secondary | ICD-10-CM | POA: Insufficient documentation

## 2019-09-29 DIAGNOSIS — Z79899 Other long term (current) drug therapy: Secondary | ICD-10-CM | POA: Diagnosis not present

## 2019-09-29 DIAGNOSIS — I352 Nonrheumatic aortic (valve) stenosis with insufficiency: Secondary | ICD-10-CM | POA: Diagnosis not present

## 2019-09-29 MED ORDER — CARVEDILOL 3.125 MG PO TABS: 3.1250 mg | ORAL_TABLET | Freq: Two times a day (BID) | ORAL | 0 refills | Status: DC

## 2019-09-29 MED ORDER — LISINOPRIL 10 MG PO TABS: 10.0000 mg | ORAL_TABLET | Freq: Two times a day (BID) | ORAL | 0 refills | Status: DC

## 2019-09-29 NOTE — Patient Instructions (Addendum)
Continue Carvedilol and Lisinopril as prescribed. Lab today. Keep appointments with Cardiology. Follow-up with primary physician in 3 months or sooner if needed. Hypertension, Adult Hypertension is another name for high blood pressure. High blood pressure forces your heart to work harder to pump blood. This can cause problems over time. There are two numbers in a blood pressure reading. There is a top number (systolic) over a bottom number (diastolic). It is best to have a blood pressure that is below 120/80. Healthy choices can help lower your blood pressure, or you may need medicine to help lower it. What are the causes? The cause of this condition is not known. Some conditions may be related to high blood pressure. What increases the risk?  Smoking.  Having type 2 diabetes mellitus, high cholesterol, or both.  Not getting enough exercise or physical activity.  Being overweight.  Having too much fat, sugar, calories, or salt (sodium) in your diet.  Drinking too much alcohol.  Having long-term (chronic) kidney disease.  Having a family history of high blood pressure.  Age. Risk increases with age.  Race. You may be at higher risk if you are African American.  Gender. Men are at higher risk than women before age 55. After age 3, women are at higher risk than men.  Having obstructive sleep apnea.  Stress. What are the signs or symptoms?  High blood pressure may not cause symptoms. Very high blood pressure (hypertensive crisis) may cause: ? Headache. ? Feelings of worry or nervousness (anxiety). ? Shortness of breath. ? Nosebleed. ? A feeling of being sick to your stomach (nausea). ? Throwing up (vomiting). ? Changes in how you see. ? Very bad chest pain. ? Seizures. How is this treated?  This condition is treated by making healthy lifestyle changes, such as: ? Eating healthy foods. ? Exercising more. ? Drinking less alcohol.  Your health care provider may prescribe  medicine if lifestyle changes are not enough to get your blood pressure under control, and if: ? Your top number is above 130. ? Your bottom number is above 80.  Your personal target blood pressure may vary. Follow these instructions at home: Eating and drinking   If told, follow the DASH eating plan. To follow this plan: ? Fill one half of your plate at each meal with fruits and vegetables. ? Fill one fourth of your plate at each meal with whole grains. Whole grains include whole-wheat pasta, brown rice, and whole-grain bread. ? Eat or drink low-fat dairy products, such as skim milk or low-fat yogurt. ? Fill one fourth of your plate at each meal with low-fat (lean) proteins. Low-fat proteins include fish, chicken without skin, eggs, beans, and tofu. ? Avoid fatty meat, cured and processed meat, or chicken with skin. ? Avoid pre-made or processed food.  Eat less than 1,500 mg of salt each day.  Do not drink alcohol if: ? Your doctor tells you not to drink. ? You are pregnant, may be pregnant, or are planning to become pregnant.  If you drink alcohol: ? Limit how much you use to:  0-1 drink a day for women.  0-2 drinks a day for men. ? Be aware of how much alcohol is in your drink. In the [place] one drink equals one 12 oz bottle of beer (355 mL), one 5 oz glass of wine (148 mL), or one 1 oz glass of hard liquor (44 mL). Lifestyle   Work with your doctor to stay at a healthy weight  or to lose weight. Ask your doctor what the best weight is for you.  Get at least 30 minutes of exercise most days of the week. This may include walking, swimming, or biking.  Get at least 30 minutes of exercise that strengthens your muscles (resistance exercise) at least 3 days a week. This may include lifting weights or doing Pilates.  Do not use any products that contain nicotine or tobacco, such as cigarettes, e-cigarettes, and chewing tobacco. If you need help quitting, ask your doctor.  Check  your blood pressure at home as told by your doctor.  Keep all follow-up visits as told by your doctor. This is important. Medicines  Take over-the-counter and prescription medicines only as told by your doctor. Follow directions carefully.  Do not skip doses of blood pressure medicine. The medicine does not work as well if you skip doses. Skipping doses also puts you at risk for problems.  Ask your doctor about side effects or reactions to medicines that you should watch for. Contact a doctor if you:  Think you are having a reaction to the medicine you are taking.  Have headaches that keep coming back (recurring).  Feel dizzy.  Have swelling in your ankles.  Have trouble with your vision. Get help right away if you:  Get a very bad headache.  Start to feel mixed up (confused).  Feel weak or numb.  Feel faint.  Have very bad pain in your: ? Chest. ? Belly (abdomen).  Throw up more than once.  Have trouble breathing. Summary  Hypertension is another name for high blood pressure.  High blood pressure forces your heart to work harder to pump blood.  For most people, a normal blood pressure is less than 120/80.  Making healthy choices can help lower blood pressure. If your blood pressure does not get lower with healthy choices, you may need to take medicine. This information is not intended to replace advice given to you by your health care provider. Make sure you discuss any questions you have with your health care provider. Document Revised: 12/09/2017 Document Reviewed: 12/09/2017 Elsevier Patient Education  2020 [employer].

## 2019-09-30 LAB — CBC WITH DIFFERENTIAL
Basophils Absolute: 0 10*3/uL (ref 0.0–0.2)
Basos: 1 %
EOS (ABSOLUTE): 0 10*3/uL (ref 0.0–0.4)
Eos: 1 %
Hematocrit: 47 % (ref 37.5–51.0)
Hemoglobin: 16.2 g/dL (ref 13.0–17.7)
Immature Grans (Abs): 0 10*3/uL (ref 0.0–0.1)
Immature Granulocytes: 0 %
Lymphocytes Absolute: 1.8 10*3/uL (ref 0.7–3.1)
Lymphs: 25 %
MCH: 30.9 pg (ref 26.6–33.0)
MCHC: 34.5 g/dL (ref 31.5–35.7)
MCV: 90 fL (ref 79–97)
Monocytes Absolute: 0.6 10*3/uL (ref 0.1–0.9)
Monocytes: 8 %
Neutrophils Absolute: 4.8 10*3/uL (ref 1.4–7.0)
Neutrophils: 65 %
RBC: 5.24 x10E6/uL (ref 4.14–5.80)
RDW: 13.6 % (ref 11.6–15.4)
WBC: 7.3 10*3/uL (ref 3.4–10.8)

## 2019-09-30 LAB — CMP14+EGFR
ALT: 30 IU/L (ref 0–44)
AST: 19 IU/L (ref 0–40)
Albumin/Globulin Ratio: 2 (ref 1.2–2.2)
Albumin: 4.8 g/dL (ref 3.8–4.8)
Alkaline Phosphatase: 122 IU/L — ABNORMAL HIGH (ref 48–121)
BUN/Creatinine Ratio: 15 (ref 10–24)
BUN: 13 mg/dL (ref 8–27)
Bilirubin Total: 1 mg/dL (ref 0.0–1.2)
CO2: 22 mmol/L (ref 20–29)
Calcium: 9.4 mg/dL (ref 8.6–10.2)
Chloride: 100 mmol/L (ref 96–106)
Creatinine, Ser: 0.86 mg/dL (ref 0.76–1.27)
GFR calc Af Amer: 108 mL/min/{1.73_m2} (ref >59)
GFR calc non Af Amer: 94 mL/min/{1.73_m2} (ref >59)
Globulin, Total: 2.4 g/dL (ref 1.5–4.5)
Glucose: 115 mg/dL — ABNORMAL HIGH (ref 65–99)
Potassium: 4.6 mmol/L (ref 3.5–5.2)
Sodium: 137 mmol/L (ref 134–144)
Total Protein: 7.2 g/dL (ref 6.0–8.5)

## 2019-09-30 LAB — LIPID PANEL
Chol/HDL Ratio: 3.2 ratio (ref 0.0–5.0)
Cholesterol, Total: 132 mg/dL (ref 100–199)
HDL: 41 mg/dL (ref >39)
LDL Chol Calc (NIH): 76 mg/dL (ref 0–99)
Triglycerides: 74 mg/dL (ref 0–149)
VLDL Cholesterol Cal: 15 mg/dL (ref 5–40)

## 2019-09-30 LAB — HEMOGLOBIN A1C
Est. average glucose Bld gHb Est-mCnc: 111 mg/dL
Hgb A1c MFr Bld: 5.5 % (ref 4.8–5.6)

## 2019-10-01 NOTE — Progress Notes (Signed)
Please call patient with update.   Kidney function normal.   Liver function normal.   Cholesterol normal.   No anemia.   No diabetes.

## 2019-10-03 ENCOUNTER — Telehealth: Payer: Self-pay

## 2019-10-03 NOTE — Telephone Encounter (Signed)
Contacted pt to go over lab results pt is aware and doesn't have any questions or concerns 

## 2019-10-13 ENCOUNTER — Ambulatory Visit: Payer: Medicaid Other | Admitting: Internal Medicine

## 2019-11-08 ENCOUNTER — Other Ambulatory Visit: Payer: Self-pay | Admitting: Internal Medicine

## 2019-11-08 DIAGNOSIS — I10 Essential (primary) hypertension: Secondary | ICD-10-CM

## 2019-11-17 ENCOUNTER — Other Ambulatory Visit: Payer: Self-pay | Admitting: Internal Medicine

## 2019-11-17 DIAGNOSIS — I10 Essential (primary) hypertension: Secondary | ICD-10-CM

## 2019-11-17 MED ORDER — POTASSIUM CHLORIDE ER 10 MEQ PO TBCR: 10.0000 meq | EXTENDED_RELEASE_TABLET | Freq: Every day | ORAL | 0 refills | Status: DC

## 2019-11-17 MED ORDER — LISINOPRIL 10 MG PO TABS: 10.0000 mg | ORAL_TABLET | Freq: Two times a day (BID) | ORAL | 0 refills | Status: DC

## 2019-11-17 NOTE — Telephone Encounter (Signed)
Medication Refill - Medication: potassium chloride (KLOR-CON) 10 MEQ tablet, lisinopril (ZESTRIL) 10 MG tablet (Patient is completely out of medication and needs refill) Has the patient contacted their pharmacy?Yes (Agent: If no, request that the patient contact the pharmacy for the refill.) (Agent: If yes, when and what did the pharmacy advise?)Contact PCP  Preferred Pharmacy (with phone number or street name):  [id number] - [place], [place]. [address] Phone:  [phone]  Fax:  [phone]       Agent: Please be advised that RX refills may take up to 3 business days. We ask that you follow-up with your pharmacy.

## 2019-11-17 NOTE — Telephone Encounter (Signed)
Requested Prescriptions  Pending Prescriptions Disp Refills  . lisinopril (ZESTRIL) 10 MG tablet 180 tablet 0    Sig: Take 1 tablet (10 mg total) by mouth 2 (two) times daily.     Cardiovascular:  ACE Inhibitors Passed - 11/17/2019 12:22 PM      Passed - Cr in normal range and within 180 days    Creatinine, Ser  Date Value Ref Range Status  09/29/2019 0.86 0.76 - 1.27 mg/dL Final         Passed - K in normal range and within 180 days    Potassium  Date Value Ref Range Status  09/29/2019 4.6 3.5 - 5.2 mmol/L Final         Passed - Patient is not pregnant      Passed - Last BP in normal range    BP Readings from Last 1 Encounters:  09/29/19 127/73         Passed - Valid encounter within last 6 months    Recent Outpatient Visits          1 month ago Essential hypertension   [place], [place], NP   3 months ago Essential hypertension   [place], [place], NP   8 months ago Need for influenza vaccination   [place], Jarome Matin, RPH-CPP   8 months ago Essential hypertension   [place], Deborah B, MD   11 months ago Essential hypertension   [place], MD      Future Appointments            In 1 month Wynetta Emery Dalbert Batman, MD [place]   In 6 months Erlinda Hong, Marylynn Pearson, MD [facility]           . potassium chloride (KLOR-CON) 10 MEQ tablet 90 tablet 0    Sig: Take 1 tablet (10 mEq total) by mouth daily.     Endocrinology:  Minerals - Potassium Supplementation Passed - 11/17/2019 12:22 PM      Passed - K in normal range and within 360 days    Potassium  Date Value Ref Range Status  09/29/2019 4.6 3.5 - 5.2 mmol/L Final         Passed - Cr in normal range and within 360 days    Creatinine, Ser  Date Value Ref Range  Status  09/29/2019 0.86 0.76 - 1.27 mg/dL Final         Passed - Valid encounter within last 12 months    Recent Outpatient Visits          1 month ago Essential hypertension   [place], [place], NP   3 months ago Essential hypertension   [place], [place], NP   8 months ago Need for influenza vaccination   [place], Jarome Matin, RPH-CPP   8 months ago Essential hypertension   [place], MD   11 months ago Essential hypertension   [place], MD      Future Appointments            In 1 month Wynetta Emery Dalbert Batman, MD [facility]   In 6 months Erlinda Hong, Marylynn Pearson, MD [facility]

## 2019-12-30 ENCOUNTER — Encounter: Payer: Self-pay | Admitting: Internal Medicine

## 2019-12-30 ENCOUNTER — Other Ambulatory Visit: Payer: Self-pay

## 2019-12-30 ENCOUNTER — Ambulatory Visit: Payer: Medicaid Other | Attending: Internal Medicine | Admitting: Internal Medicine

## 2019-12-30 VITALS — BP 134/80 | HR 59 | Temp 98.3°F | Resp 16 | Wt 234.8 lb

## 2019-12-30 DIAGNOSIS — I2510 Atherosclerotic heart disease of native coronary artery without angina pectoris: Secondary | ICD-10-CM

## 2019-12-30 DIAGNOSIS — I10 Essential (primary) hypertension: Secondary | ICD-10-CM

## 2019-12-30 DIAGNOSIS — Z23 Encounter for immunization: Secondary | ICD-10-CM

## 2019-12-30 DIAGNOSIS — Z789 Other specified health status: Secondary | ICD-10-CM | POA: Diagnosis not present

## 2019-12-30 DIAGNOSIS — E669 Obesity, unspecified: Secondary | ICD-10-CM | POA: Diagnosis not present

## 2019-12-30 DIAGNOSIS — Z1211 Encounter for screening for malignant neoplasm of colon: Secondary | ICD-10-CM

## 2019-12-30 MED ORDER — LISINOPRIL 10 MG PO TABS: 10.0000 mg | ORAL_TABLET | Freq: Two times a day (BID) | ORAL | 1 refills | Status: DC

## 2019-12-30 MED ORDER — POTASSIUM CHLORIDE ER 10 MEQ PO TBCR: 10.0000 meq | EXTENDED_RELEASE_TABLET | Freq: Every day | ORAL | 1 refills | Status: DC

## 2019-12-30 MED ORDER — CARVEDILOL 3.125 MG PO TABS: 3.1250 mg | ORAL_TABLET | Freq: Two times a day (BID) | ORAL | 1 refills | Status: DC

## 2019-12-30 NOTE — Progress Notes (Signed)
Patient ID: Joshua [name], male    DOB: 10/10/1960  MRN: [id number]  CC: Hypertension   Subjective: Joshua [name] is a 58 y.o. male who presents for chronic ds management. His concerns today include:  Patient with history of HTN, OA BL hips, prediabetes(last A1C 10/2018 nl), CAD [with stent to LAD, RCA],EF55-60%%,bradycardia ,bicuspid aortic valve,mod AS,milddilated ascending aorta.  HYPERTENSION/CAD/aortic stenosis/bicuspid aortic valve Currently taking: see medication list.  He is on lisinopril 10 mg twice a day and carvedilol 3.125 mg twice a day.  He also takes potassium.  He is not on statin therapy.  When he is placed on statin he has significant elevation in LFTs.  LFTs were last checked in June of this year.  For the most part numbers were normal except for slight elevation in alkaline phosphatase which had improved Med Adherence: [x]  Yes    []  No Medication side effects: []  Yes    [x]  No Adherence with salt restriction: [x]  Yes    []  No Home Monitoring?: [x]  Yes    []  No Monitoring Frequency: several times a wk Home BP results range: SBP 115-130/DBP 70-80s, Pulse in 50s SOB? []  Yes    [x]  No Chest Pain?: []  Yes    [x]  No Leg swelling?: []  Yes    [x]  No Headaches?: []  Yes    [x]  No Dizziness? []  Yes    [x]  No Comments: due for f/u with cardiology.  Would like for me to schedule for him.  Last echo was January of this year.  It revealed normal ejection fraction with moderate AS.  The aortic root was normal in size.  Obesity:  Gained 20 lbs b/w June 2021 and now.  Not as active as he should be.  Over a during the pandemic.  HM:  Over due for c-scope.  Reports having colonoscopy in the past that was normal. no fhx of colon cancer.   Patient Active Problem List   Diagnosis Date Noted  . Statin intolerance 12/10/2018  . 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/15/2018  . Chronic left shoulder pain 06/08/2018  . Status post total hip replacement, right  04/26/2018  . Mild dilation of ascending aorta (HCC) 12/26/2017  . Dyslipidemia 12/26/2017  . Bilateral carotid bruits 12/26/2017  . Nocturnal hypoxemia 06/29/2017  . Primary osteoarthritis of both hips 06/29/2017  . Coronary artery disease involving native coronary artery of native heart without angina pectoris 03/31/2017  . Ischemic cardiomyopathy 03/31/2017  . Marijuana use 03/31/2017  . Prediabetes 03/31/2017  . Aortic valve insufficiency 12/28/2016  . Mitral valve regurgitation 12/28/2016  . Obesity 12/28/2016  . Aortic valve stenosis, nonrheumatic   . Essential hypertension      Current Outpatient Medications on File Prior to Visit  Medication Sig Dispense Refill  . aspirin EC 81 MG tablet Take 1 tablet (81 mg total) by mouth 2 (two) times daily. 84 tablet 0  . senna-docusate (SENOKOT S) 8.6-50 MG tablet Take 1-2 tablets by mouth at bedtime as needed. 30 tablet 1   No current facility-administered medications on file prior to visit.    No Known Allergies  Social History   Socioeconomic History  . Marital status: Single    Spouse name: Not on file  . Number of children: Not on file  . Years of education: Not on file  . Highest education level: Not on file  Occupational History  . Not on file  Tobacco Use  . Smoking status: Former Research scientist (life sciences)  .  Smokeless tobacco: Never Used  . Tobacco comment: Smoked lightly for approx 20 years, quit 1990s  Vaping Use  . Vaping Use: Never used  Substance and Sexual Activity  . Alcohol use: Yes    Comment: 1-2 mixed drinks per week  . Drug use: Yes    Types: Marijuana    Comment: occasional  . Sexual activity: Not on file  Other Topics Concern  . Not on file  Social History Narrative   Lives Home alone. Sister makes all health related decisions. Declined Advanced directive at this time   Social Determinants of Health   Financial Resource Strain:   . Difficulty of Paying Living Expenses: Not on file  Food Insecurity:   .  Worried About Charity fundraiser in the Last Year: Not on file  . Ran Out of Food in the Last Year: Not on file  Transportation Needs:   . Lack of Transportation (Medical): Not on file  . Lack of Transportation (Non-Medical): Not on file  Physical Activity:   . Days of Exercise per Week: Not on file  . Minutes of Exercise per Session: Not on file  Stress:   . Feeling of Stress : Not on file  Social Connections:   . Frequency of Communication with Friends and Family: Not on file  . Frequency of Social Gatherings with Friends and Family: Not on file  . Attends Religious Services: Not on file  . Active Member of Clubs or Organizations: Not on file  . Attends Archivist Meetings: Not on file  . Marital Status: Not on file  Intimate Partner Violence:   . Fear of Current or Ex-Partner: Not on file  . Emotionally Abused: Not on file  . Physically Abused: Not on file  . Sexually Abused: Not on file    Family History  Problem Relation Age of Onset  . CAD Father        MI/CABG age 1    Past Surgical History:  Procedure Laterality Date  . CORONARY STENT INTERVENTION N/A 12/29/2016   Procedure: CORONARY STENT INTERVENTION;  Surgeon: Jettie Booze, MD;  Location: [facility];  Service: Cardiovascular;  Laterality: N/A;  . RIGHT/LEFT HEART CATH AND CORONARY ANGIOGRAPHY N/A 12/29/2016   Procedure: RIGHT/LEFT HEART CATH AND CORONARY ANGIOGRAPHY;  Surgeon: Jettie Booze, MD;  Location: [facility];  Service: Cardiovascular;  Laterality: N/A;  . TONSILLECTOMY    . TOTAL HIP ARTHROPLASTY Right 04/26/2018   Procedure: RIGHT TOTAL HIP ARTHROPLASTY ANTERIOR APPROACH;  Surgeon: Leandrew Koyanagi, MD;  Location: [facility];  Service: Orthopedics;  Laterality: Right;  . TOTAL HIP ARTHROPLASTY Left 11/15/2018  . TOTAL HIP ARTHROPLASTY Left 11/15/2018   Procedure: LEFT TOTAL HIP ARTHROPLASTY ANTERIOR APPROACH;  Surgeon: Leandrew Koyanagi, MD;  Location: [facility];  Service:  Orthopedics;  Laterality: Left;    ROS: Review of Systems Negative except as stated above  PHYSICAL EXAM: BP 134/80   Pulse (!) 59   Temp 98.3 F (36.8 C)   Resp 16   Wt 234 lb 12.8 oz (106.5 kg)   SpO2 95%   BMI 30.98 kg/m   Wt Readings from Last 3 Encounters:  12/30/19 234 lb 12.8 oz (106.5 kg)  09/29/19 214 lb 9.6 oz (97.3 kg)  02/25/19 227 lb 6.4 oz (103.1 kg)    Physical Exam  General appearance - alert, well appearing, and in no distress Mental status - normal mood, behavior, speech, dress, motor activity, and  thought processes Neck - supple, no significant adenopathy Chest - clear to auscultation, no wheezes, rales or rhonchi, symmetric air entry Heart -regular rate and rhythm.  No gallops heard.  2 out of 6 systolic ejection murmur heard best n the left upper sternal border Extremities - peripheral pulses normal, no pedal edema, no clubbing or cyanosis  CMP Latest Ref Rng & Units 09/29/2019 02/24/2019 11/22/2018  Glucose 65 - 99 mg/dL 115(H) 100(H) 118(H)  BUN 8 - 27 mg/dL 13 17 16   Creatinine 0.76 - 1.27 mg/dL 0.86 0.86 0.76  Sodium 134 - 144 mmol/L 137 140 136  Potassium 3.5 - 5.2 mmol/L 4.6 4.8 4.2  Chloride 96 - 106 mmol/L 100 102 102  CO2 20 - 29 mmol/L 22 25 24   Calcium 8.6 - 10.2 mg/dL 9.4 9.4 8.8(L)  Total Protein 6.0 - 8.5 g/dL 7.2 7.2 6.1(L)  Total Bilirubin 0.0 - 1.2 mg/dL 1.0 0.3 0.8  Alkaline Phos 48 - 121 IU/L 122(H) 154(H) 164(H)  AST 0 - 40 IU/L 19 18 24   ALT 0 - 44 IU/L 30 25 69(H)   Lipid Panel     Component Value Date/Time   CHOL 132 09/29/2019 1420   TRIG 74 09/29/2019 1420   HDL 41 09/29/2019 1420   CHOLHDL 3.2 09/29/2019 1420   CHOLHDL 4.6 12/27/2016 0238   VLDL 18 12/27/2016 0238   LDLCALC 76 09/29/2019 1420    CBC    Component Value Date/Time   WBC 7.3 09/29/2019 1420   WBC 7.7 11/22/2018 1520   RBC 5.24 09/29/2019 1420   RBC 4.20 (L) 11/22/2018 1520   HGB 16.2 09/29/2019 1420   HCT 47.0 09/29/2019 1420   PLT 204  11/22/2018 1520   PLT 188 01/05/2018 1359   MCV 90 09/29/2019 1420   MCH 30.9 09/29/2019 1420   MCH 31.7 11/22/2018 1520   MCHC 34.5 09/29/2019 1420   MCHC 33.3 11/22/2018 1520   RDW 13.6 09/29/2019 1420   LYMPHSABS 1.8 09/29/2019 1420   MONOABS 0.6 11/22/2018 1520   EOSABS 0.0 09/29/2019 1420   BASOSABS 0.0 09/29/2019 1420    ASSESSMENT AND PLAN: 1. Essential hypertension Close to goal.  Continue current medications and low-salt diet. - potassium chloride (KLOR-CON) 10 MEQ tablet; Take 1 tablet (10 mEq total) by mouth daily. Dx: Hypokalemia  Dispense: 90 tablet; Refill: 1 - lisinopril (ZESTRIL) 10 MG tablet; Take 1 tablet (10 mg total) by mouth 2 (two) times daily.  Dispense: 180 tablet; Refill: 1 - carvedilol (COREG) 3.125 MG tablet; Take 1 tablet (3.125 mg total) by mouth 2 (two) times daily.  Dispense: 180 tablet; Refill: 1  2. Coronary artery disease involving native coronary artery of native heart without angina pectoris Stable.  Continue carvedilol and aspirin.  He has been intolerant of statin therapy.  Last cholesterol revealed an LDL of 76 without statin therapy. - Ambulatory referral to Cardiology  4. Obesity (BMI 30.0-34.9) Dietary counseling given.  Advised to eliminate sugary drinks from the diet, cut back on white carbohydrates, incorporate fresh fruits and vegetables into the diet, and eat more lean white meat instead of red meat.  5. Need for immunization against influenza Given today - Flu Vaccine QUAD 36+ mos IM  6. Screening for colon cancer Discussed colon cancer screening.  He is skeptical about having colonoscopy given the Covid pandemic.  We discussed other options since he reports having a normal colonoscopy in the past.  He is agreeable to doing the Cologuard instead. - Cologuard  7. Need for shingles vaccine Follow-up in 2 weeks with the clinical pharmacist start Shingrix vaccine series.   Patient was given the opportunity to ask questions.  Patient  verbalized understanding of the plan and was able to repeat key elements of the plan.   Orders Placed This Encounter  Procedures  . Flu Vaccine QUAD 36+ mos IM  . Cologuard  . Ambulatory referral to Cardiology     Requested Prescriptions   Signed Prescriptions Disp Refills  . potassium chloride (KLOR-CON) 10 MEQ tablet 90 tablet 1    Sig: Take 1 tablet (10 mEq total) by mouth daily. Dx: Hypokalemia  . lisinopril (ZESTRIL) 10 MG tablet 180 tablet 1    Sig: Take 1 tablet (10 mg total) by mouth 2 (two) times daily.  . carvedilol (COREG) 3.125 MG tablet 180 tablet 1    Sig: Take 1 tablet (3.125 mg total) by mouth 2 (two) times daily.    Return in about 4 months (around 04/30/2020) for Give appt with [facility] in 1-2 weeks for Shingles vaccine.  Karle Plumber, MD, FACP

## 2019-12-30 NOTE — Patient Instructions (Signed)
We will have you follow-up in 2 weeks as a nurse only visit to get the first shot of your shingles vaccine.  Continue monitoring your blood pressure at least twice a week.  Goal is 130/80 or lower.   Obesity, Adult Obesity is having too much body fat. Being obese means that your weight is more than what is healthy for you. BMI is a number that explains how much body fat you have. If you have a BMI of 30 or more, you are obese. Obesity is often caused by eating or drinking more calories than your body uses. Changing your lifestyle can help you lose weight. Obesity can cause serious health problems, such as:  Stroke.  Coronary artery disease (CAD).  Type 2 diabetes.  Some types of cancer, including cancers of the colon, breast, uterus, and gallbladder.  Osteoarthritis.  High blood pressure (hypertension).  High cholesterol.  Sleep apnea.  Gallbladder stones.  Infertility problems. What are the causes?  Eating meals each day that are high in calories, sugar, and fat.  Being born with genes that may make you more likely to become obese.  Having a medical condition that causes obesity.  Taking certain medicines.  Sitting a lot (having a sedentary lifestyle).  Not getting enough sleep.  Drinking a lot of drinks that have sugar in them. What increases the risk?  Having a family history of obesity.  Being an [place] American woman.  Being a Hispanic man.  Living in an area with limited access to: ? Romilda Garret, recreation centers, or sidewalks. ? Healthy food choices, such as grocery stores and farmers' markets. What are the signs or symptoms? The main sign is having too much body fat. How is this treated?  Treatment for this condition often includes changing your lifestyle. Treatment may include: ? Changing your diet. This may include making a healthy meal plan. ? Exercise. This may include activity that causes your heart to beat faster (aerobic exercise) and strength  training. Work with your doctor to design a program that works for you. ? Medicine to help you lose weight. This may be used if you are not able to lose 1 pound a week after 6 weeks of healthy eating and more exercise. ? Treating conditions that cause the obesity. ? Surgery. Options may include gastric banding and gastric bypass. This may be done if:  Other treatments have not helped to improve your condition.  You have a BMI of 40 or higher.  You have life-threatening health problems related to obesity. Follow these instructions at home: Eating and drinking   Follow advice from your doctor about what to eat and drink. Your doctor may tell you to: ? Limit fast food, sweets, and processed snack foods. ? Choose low-fat options. For example, choose low-fat milk instead of whole milk. ? Eat 5 or more servings of fruits or vegetables each day. ? Eat at home more often. This gives you more control over what you eat. ? Choose healthy foods when you eat out. ? Learn to read food labels. This will help you learn how much food is in 1 serving. ? Keep low-fat snacks available. ? Avoid drinks that have a lot of sugar in them. These include soda, fruit juice, iced tea with sugar, and flavored milk.  Drink enough water to keep your pee (urine) pale yellow.  Do not go on fad diets. Physical activity  Exercise often, as told by your doctor. Most adults should get up to 150 minutes of  moderate-intensity exercise every week.Ask your doctor: ? What types of exercise are safe for you. ? How often you should exercise.  Warm up and stretch before being active.  Do slow stretching after being active (cool down).  Rest between times of being active. Lifestyle  Work with your doctor and a food expert (dietitian) to set a weight-loss goal that is best for you.  Limit your screen time.  Find ways to reward yourself that do not involve food.  Do not drink alcohol if: ? Your doctor tells you not to  drink. ? You are pregnant, may be pregnant, or are planning to become pregnant.  If you drink alcohol: ? Limit how much you use to:  0-1 drink a day for women.  0-2 drinks a day for men. ? Be aware of how much alcohol is in your drink. In the [place] one drink equals one 12 oz bottle of beer (355 mL), one 5 oz glass of wine (148 mL), or one 1 oz glass of hard liquor (44 mL). General instructions  Keep a weight-loss journal. This can help you keep track of: ? The food that you eat. ? How much exercise you get.  Take over-the-counter and prescription medicines only as told by your doctor.  Take vitamins and supplements only as told by your doctor.  Think about joining a support group.  Keep all follow-up visits as told by your doctor. This is important. Contact a doctor if:  You cannot meet your weight loss goal after you have changed your diet and lifestyle for 6 weeks. Get help right away if you:  Are having trouble breathing.  Are having thoughts of harming yourself. Summary  Obesity is having too much body fat.  Being obese means that your weight is more than what is healthy for you.  Work with your doctor to set a weight-loss goal.  Get regular exercise as told by your doctor. This information is not intended to replace advice given to you by your health care provider. Make sure you discuss any questions you have with your health care provider. Document Revised: 12/03/2017 Document Reviewed: 12/03/2017 Elsevier Patient Education  2020 [employer].

## 2020-01-13 ENCOUNTER — Ambulatory Visit: Payer: Medicaid Other | Admitting: Pharmacist

## 2020-01-17 ENCOUNTER — Other Ambulatory Visit: Payer: Self-pay

## 2020-01-17 ENCOUNTER — Ambulatory Visit: Payer: Medicaid Other | Attending: Internal Medicine | Admitting: Pharmacist

## 2020-01-17 DIAGNOSIS — Z23 Encounter for immunization: Secondary | ICD-10-CM | POA: Diagnosis not present

## 2020-01-17 NOTE — Progress Notes (Signed)
Patient presents for vaccination against zoster per orders of Dr. Johnson. Consent given. Counseling provided. No contraindications exists. Vaccine administered without incident.   Luke Van Ausdall, PharmD, CPP Clinical Pharmacist [facility] [phone]  

## 2020-02-01 ENCOUNTER — Telehealth: Payer: Self-pay | Admitting: Internal Medicine

## 2020-02-02 NOTE — Telephone Encounter (Signed)
Contacted pt and went over Dr. Johnson message pt is aware and doesn't have any questions or concerns  

## 2020-04-27 ENCOUNTER — Other Ambulatory Visit: Payer: Self-pay

## 2020-04-27 ENCOUNTER — Ambulatory Visit: Payer: Medicaid Other | Attending: Internal Medicine | Admitting: Internal Medicine

## 2020-04-27 DIAGNOSIS — Z1211 Encounter for screening for malignant neoplasm of colon: Secondary | ICD-10-CM

## 2020-04-27 DIAGNOSIS — Z789 Other specified health status: Secondary | ICD-10-CM | POA: Diagnosis not present

## 2020-04-27 DIAGNOSIS — I2510 Atherosclerotic heart disease of native coronary artery without angina pectoris: Secondary | ICD-10-CM

## 2020-04-27 DIAGNOSIS — Z23 Encounter for immunization: Secondary | ICD-10-CM

## 2020-04-27 DIAGNOSIS — I10 Essential (primary) hypertension: Secondary | ICD-10-CM | POA: Diagnosis not present

## 2020-04-27 HISTORY — DX: Encounter for immunization: Z23

## 2020-04-27 NOTE — Progress Notes (Signed)
Virtual Visit via Telephone Note  I connected with Joshua [name] on 04/27/20 at 1:41 p.m by telephone and verified that I am speaking with the correct person using two identifiers.  Location: Patient: home Provider: office  Only myself and the patient participated in this visit. I discussed the limitations, risks, security and privacy concerns of performing an evaluation and management service by telephone and the availability of in person appointments. I also discussed with the patient that there may be a patient responsible charge related to this service. The patient expressed understanding and agreed to proceed.   History of Present Illness: Patient with history of HTN, OA BL hips, prediabetes(last A1C 10/2018 nl), CAD [with stent to LAD, RCA],EF55-60%%,bradycardia ,bicuspid aortic valve,mod AS,milddilated ascending aorta. Last seen 12/2019.   HTN/CAD/Bicuspid aortic valve: Compliant with ASA, Coreg, Potassium and Lisinopril. Not on statin due to increase LFTs x 2 with attempts.  Declined further trial of statin therapy.  He checks his blood pressure intermittently and reports that readings have been good.  He does not have any readings to share with me today. No CP, SOB, LE edema, HA Referred for f/u with cardiologist.  Has Echo scheduled for later this mth and cardiologist next mth  HM:  Has cologuard test kit.  Plans to send it in soon. Had COVID-19 Booster 2 wks ago. Outpatient Encounter Medications as of 04/27/2020  Medication Sig  . aspirin EC 81 MG tablet Take 1 tablet (81 mg total) by mouth 2 (two) times daily.  . carvedilol (COREG) 3.125 MG tablet Take 1 tablet (3.125 mg total) by mouth 2 (two) times daily.  Marland Kitchen lisinopril (ZESTRIL) 10 MG tablet Take 1 tablet (10 mg total) by mouth 2 (two) times daily.  . potassium chloride (KLOR-CON) 10 MEQ tablet Take 1 tablet (10 mEq total) by mouth daily. Dx: Hypokalemia  . senna-docusate (SENOKOT S) 8.6-50 MG tablet Take 1-2  tablets by mouth at bedtime as needed.   No facility-administered encounter medications on file as of 04/27/2020.    Observations/Objective: Lab Results  Component Value Date   CHOL 132 09/29/2019   HDL 41 09/29/2019   LDLCALC 76 09/29/2019   TRIG 74 09/29/2019   CHOLHDL 3.2 09/29/2019   Lab Results  Component Value Date   WBC 7.3 09/29/2019   HGB 16.2 09/29/2019   HCT 47.0 09/29/2019   MCV 90 09/29/2019   PLT 204 11/22/2018     Chemistry      Component Value Date/Time   NA 137 09/29/2019 1420   K 4.6 09/29/2019 1420   CL 100 09/29/2019 1420   CO2 22 09/29/2019 1420   BUN 13 09/29/2019 1420   CREATININE 0.86 09/29/2019 1420      Component Value Date/Time   CALCIUM 9.4 09/29/2019 1420   ALKPHOS 122 (H) 09/29/2019 1420   AST 19 09/29/2019 1420   ALT 30 09/29/2019 1420   BILITOT 1.0 09/29/2019 1420       Assessment and Plan: 1. Essential hypertension Continue carvedilol and lisinopril.  Encourage regular checking of blood pressure at least once a week with goal being 130/80 or lower.  2. Coronary artery disease involving native coronary artery of native heart without angina pectoris Continue aspirin and beta-blocker.  Intolerance of statin.  3. Statin intolerance   4. Screening for colon cancer Encourage patient to use the Cologuard test and send it in as soon as possible.  He promised me to do so  5. COVID-19 vaccine regimen to maintain immunity completed  Patient has completed COVID-19 vaccination.  Health maintenance has been updated.   Follow Up Instructions: 4 months.   I discussed the assessment and treatment plan with the patient. The patient was provided an opportunity to ask questions and all were answered. The patient agreed with the plan and demonstrated an understanding of the instructions.   The patient was advised to call back or seek an in-person evaluation if the symptoms worsen or if the condition fails to improve as anticipated.  I  provided 9 minutes of non-face-to-face time during this encounter.   Karle Plumber, MD

## 2020-04-30 ENCOUNTER — Ambulatory Visit: Payer: Medicaid Other | Admitting: Pharmacist

## 2020-05-01 ENCOUNTER — Other Ambulatory Visit (HOSPITAL_COMMUNITY): Payer: Medicaid Other

## 2020-05-03 ENCOUNTER — Ambulatory Visit: Payer: Medicaid Other | Attending: Internal Medicine | Admitting: Pharmacist

## 2020-05-03 ENCOUNTER — Other Ambulatory Visit: Payer: Self-pay

## 2020-05-03 DIAGNOSIS — Z23 Encounter for immunization: Secondary | ICD-10-CM | POA: Diagnosis not present

## 2020-05-03 NOTE — Progress Notes (Signed)
Patient presents for vaccination against zoster per orders of Dr. Johnson. Consent given. Counseling provided. No contraindications exists. Vaccine administered without incident.   Luke Van Ausdall, PharmD, BCACP, CPP Clinical Pharmacist [facility] [phone]  

## 2020-05-16 ENCOUNTER — Ambulatory Visit: Payer: Medicaid Other | Admitting: Cardiovascular Disease

## 2020-05-21 ENCOUNTER — Ambulatory Visit (HOSPITAL_COMMUNITY): Payer: Medicaid Other | Attending: Cardiovascular Disease

## 2020-05-21 ENCOUNTER — Other Ambulatory Visit: Payer: Self-pay

## 2020-05-21 DIAGNOSIS — I350 Nonrheumatic aortic (valve) stenosis: Secondary | ICD-10-CM | POA: Insufficient documentation

## 2020-05-21 LAB — ECHOCARDIOGRAM COMPLETE
AR max vel: 1.12 cm2
AV Area VTI: 1.31 cm2
AV Area mean vel: 1.18 cm2
AV Mean grad: 25 mmHg
AV Peak grad: 46.5 mmHg
Ao pk vel: 3.41 m/s
Area-P 1/2: 2.62 cm2
P 1/2 time: 551 msec
S' Lateral: 3.6 cm

## 2020-05-24 ENCOUNTER — Encounter: Payer: Self-pay | Admitting: Orthopaedic Surgery

## 2020-05-24 ENCOUNTER — Ambulatory Visit (INDEPENDENT_AMBULATORY_CARE_PROVIDER_SITE_OTHER): Payer: Medicaid Other

## 2020-05-24 ENCOUNTER — Ambulatory Visit (INDEPENDENT_AMBULATORY_CARE_PROVIDER_SITE_OTHER): Payer: Medicaid Other | Admitting: Orthopaedic Surgery

## 2020-05-24 ENCOUNTER — Ambulatory Visit: Payer: Self-pay

## 2020-05-24 ENCOUNTER — Other Ambulatory Visit: Payer: Self-pay

## 2020-05-24 DIAGNOSIS — Z96642 Presence of left artificial hip joint: Secondary | ICD-10-CM | POA: Diagnosis not present

## 2020-05-24 DIAGNOSIS — Z96641 Presence of right artificial hip joint: Secondary | ICD-10-CM

## 2020-05-24 MED ORDER — AMOXICILLIN 500 MG PO CAPS: ORAL_CAPSULE | ORAL | 2 refills | Status: DC

## 2020-05-24 NOTE — Progress Notes (Signed)
Post-Op Visit Note   Patient: Joshua [name]           Date of Birth: 12-04-1960           MRN: [id number] Visit Date: 05/24/2020 PCP: Ladell Pier, MD   Assessment & Plan:  Chief Complaint:  Chief Complaint  Patient presents with  . Right Hip - Follow-up  . Left Hip - Follow-up   Visit Diagnoses:  1. Status post total replacement of left hip   2. Status post total hip replacement, right     Plan: Patient is a 60 year old gentleman who comes in today 2 years out right total hip replacement 04/26/2018 and 1-1/2 years out left total hip replacement 11/15/2018.  He has been doing very well in regards to both hips.  He has no pain.  He has regained full strength and range of motion.  Examination of both hips reveals full range of motion and strength.  Negative logroll.  Is neurovascular intact distally.  At this point, he will continue to advance with activity as tolerated.  Dental prophylaxis reinforced for another 6 months.  He will follow up with [place] in 1 years time for repeat evaluation and AP pelvis x-rays.  Call with concerns or questions.  Follow-Up Instructions: Return in about 1 year (around 05/24/2021).   Orders:  Orders Placed This Encounter  Procedures  . XR HIP UNILAT W OR W/O PELVIS 2-3 VIEWS LEFT  . XR HIP UNILAT W OR W/O PELVIS 2-3 VIEWS RIGHT   Meds ordered this encounter  Medications  . amoxicillin (AMOXIL) 500 MG capsule    Sig: Take 4 pills one hour prior to dental work    Dispense:  8 capsule    Refill:  2    Imaging: XR HIP UNILAT W OR W/O PELVIS 2-3 VIEWS LEFT  Result Date: 05/24/2020 Well-seated prosthesis without complication  XR HIP UNILAT W OR W/O PELVIS 2-3 VIEWS RIGHT  Result Date: 05/24/2020 Well-seated prosthesis without complication   PMFS History: Patient Active Problem List   Diagnosis Date Noted  . COVID-19 vaccine regimen to maintain immunity completed 04/27/2020  . Statin intolerance 12/10/2018  . Status post total  replacement of left hip 11/15/2018  . Chronic left shoulder pain 06/08/2018  . Status post total hip replacement, right 04/26/2018  . Mild dilation of ascending aorta (HCC) 12/26/2017  . Dyslipidemia 12/26/2017  . Bilateral carotid bruits 12/26/2017  . Nocturnal hypoxemia 06/29/2017  . Primary osteoarthritis of both hips 06/29/2017  . Coronary artery disease involving native coronary artery of native heart without angina pectoris 03/31/2017  . Ischemic cardiomyopathy 03/31/2017  . Marijuana use 03/31/2017  . Prediabetes 03/31/2017  . Aortic valve insufficiency 12/28/2016  . Mitral valve regurgitation 12/28/2016  . Obesity 12/28/2016  . Aortic valve stenosis, nonrheumatic   . Essential hypertension    Past Medical History:  Diagnosis Date  . Aortic stenosis   . CAD (coronary artery disease), native coronary artery 12/26/2016   DES LAD & RCA, EF 25-30%  . CHF (congestive heart failure) ([place])   . Dyslipidemia   . Hypertension   . Obesity   . Osteoarthritis   . Pre-diabetes    a. prior A1C 5.6, states he was on meds for this at one point.    Family History  Problem Relation Age of Onset  . CAD Father        MI/CABG age 27    Past Surgical History:  Procedure Laterality Date  . CORONARY  STENT INTERVENTION N/A 12/29/2016   Procedure: CORONARY STENT INTERVENTION;  Surgeon: Jettie Booze, MD;  Location: [facility];  Service: Cardiovascular;  Laterality: N/A;  . RIGHT/LEFT HEART CATH AND CORONARY ANGIOGRAPHY N/A 12/29/2016   Procedure: RIGHT/LEFT HEART CATH AND CORONARY ANGIOGRAPHY;  Surgeon: Jettie Booze, MD;  Location: [facility];  Service: Cardiovascular;  Laterality: N/A;  . TONSILLECTOMY    . TOTAL HIP ARTHROPLASTY Right 04/26/2018   Procedure: RIGHT TOTAL HIP ARTHROPLASTY ANTERIOR APPROACH;  Surgeon: Leandrew Koyanagi, MD;  Location: [facility];  Service: Orthopedics;  Laterality: Right;  . TOTAL HIP ARTHROPLASTY Left 11/15/2018  . TOTAL HIP ARTHROPLASTY  Left 11/15/2018   Procedure: LEFT TOTAL HIP ARTHROPLASTY ANTERIOR APPROACH;  Surgeon: Leandrew Koyanagi, MD;  Location: [facility];  Service: Orthopedics;  Laterality: Left;   Social History   Occupational History  . Not on file  Tobacco Use  . Smoking status: Former Research scientist (life sciences)  . Smokeless tobacco: Never Used  . Tobacco comment: Smoked lightly for approx 20 years, quit 1990s  Vaping Use  . Vaping Use: Never used  Substance and Sexual Activity  . Alcohol use: Yes    Comment: 1-2 mixed drinks per week  . Drug use: Yes    Types: Marijuana    Comment: occasional  . Sexual activity: Not on file

## 2020-08-09 ENCOUNTER — Ambulatory Visit (INDEPENDENT_AMBULATORY_CARE_PROVIDER_SITE_OTHER): Payer: Medicaid Other | Admitting: Cardiovascular Disease

## 2020-08-09 ENCOUNTER — Other Ambulatory Visit: Payer: Self-pay

## 2020-08-09 ENCOUNTER — Encounter: Payer: Self-pay | Admitting: Cardiovascular Disease

## 2020-08-09 VITALS — BP 124/66 | HR 64 | Ht 72.0 in | Wt 251.0 lb

## 2020-08-09 DIAGNOSIS — R0989 Other specified symptoms and signs involving the circulatory and respiratory systems: Secondary | ICD-10-CM

## 2020-08-09 DIAGNOSIS — I77810 Thoracic aortic ectasia: Secondary | ICD-10-CM | POA: Diagnosis not present

## 2020-08-09 DIAGNOSIS — I2510 Atherosclerotic heart disease of native coronary artery without angina pectoris: Secondary | ICD-10-CM

## 2020-08-09 DIAGNOSIS — I350 Nonrheumatic aortic (valve) stenosis: Secondary | ICD-10-CM | POA: Diagnosis not present

## 2020-08-09 DIAGNOSIS — I5032 Chronic diastolic (congestive) heart failure: Secondary | ICD-10-CM

## 2020-08-09 DIAGNOSIS — E785 Hyperlipidemia, unspecified: Secondary | ICD-10-CM | POA: Diagnosis not present

## 2020-08-09 DIAGNOSIS — I10 Essential (primary) hypertension: Secondary | ICD-10-CM | POA: Diagnosis not present

## 2020-08-09 MED ORDER — ASPIRIN EC 81 MG PO TBEC
81.0000 mg | DELAYED_RELEASE_TABLET | Freq: Every day | ORAL | 0 refills | Status: DC
Start: 2020-08-09 — End: 2021-11-30

## 2020-08-09 NOTE — Progress Notes (Signed)
Cardiology Office Note:    Date:  08/09/2020   ID:  Joshua [name], DOB [birth year], MRN [id number]  PCP:  Ladell Pier, MD  Cardiologist:  Sanda Klein, MD   Referring MD: Ladell Pier, MD   No chief complaint on file.   History of Present Illness:    Joshua [name] is a 60 y.o. male who presented with decompensated heart failure and severe cardiomyopathy (EF 25-30 %) in September 2018 and was identified as having severe 2 vessel coronary artery disease, with drug-eluting stents placed to the LAD and RCA. Following revascularization he has had remarkable improvement in LV systolic function which is now normal (EF 55-60%). He has a bicuspid aortic valve, moderate aortic stenosis (per echo January 2019 mean gradient 25 mmHg) and very mild aortic root enlargement (40 mm).  His repeat echocardiogram performed in February 2021 again showed a aortic valve mean gradient of 25 mmHg and the peak velocity was 3.4 m/second.  The dimensionless index is down to 0.24.  The valve was described as being tricuspid on the most recent echo, but the images are really inconclusive and mild pain.  The anterior cusp is heavily calcified and essentially immobile.  There is only mild aortic insufficiency.  There is at least one posterior cusp that is moving fairly well.  He still has normal left ventricular systolic function and has mild LVH.  He is very apologetic about his weight gain during the coronavirus pandemic.  He had both hips replaced in the last couple of years and is now more mobile and planning to exercise more.  He takes care of his own yard work and housework without any limitations.  He becomes tired but denies exertional angina, exertional dyspnea or syncope or presyncope.  He has not had lower extremity edema, claudication or any focal neurological complaints.  He denies palpitations.  He previously was on atorvastatin but this was stopped when his transaminases increased to the  200-400 range.  Work-up for viral hepatitis or autoimmune disease was negative.  His most recent LDL cholesterol performed in June 2021 was 76.  His blood pressure is better controlled when he takes lisinopril in 2 divided doses.  He did not tolerate beta blockers due to extreme bradycardia.  Past Medical History:  Diagnosis Date  . Aortic stenosis   . CAD (coronary artery disease), native coronary artery 12/26/2016   DES LAD & RCA, EF 25-30%  . CHF (congestive heart failure) ([place])   . Dyslipidemia   . Hypertension   . Obesity   . Osteoarthritis   . Pre-diabetes    a. prior A1C 5.6, states he was on meds for this at one point.    Past Surgical History:  Procedure Laterality Date  . CORONARY STENT INTERVENTION N/A 12/29/2016   Procedure: CORONARY STENT INTERVENTION;  Surgeon: Jettie Booze, MD;  Location: [facility];  Service: Cardiovascular;  Laterality: N/A;  . RIGHT/LEFT HEART CATH AND CORONARY ANGIOGRAPHY N/A 12/29/2016   Procedure: RIGHT/LEFT HEART CATH AND CORONARY ANGIOGRAPHY;  Surgeon: Jettie Booze, MD;  Location: [facility];  Service: Cardiovascular;  Laterality: N/A;  . TONSILLECTOMY    . TOTAL HIP ARTHROPLASTY Right 04/26/2018   Procedure: RIGHT TOTAL HIP ARTHROPLASTY ANTERIOR APPROACH;  Surgeon: Leandrew Koyanagi, MD;  Location: [facility];  Service: Orthopedics;  Laterality: Right;  . TOTAL HIP ARTHROPLASTY Left 11/15/2018  . TOTAL HIP ARTHROPLASTY Left 11/15/2018   Procedure: LEFT TOTAL HIP ARTHROPLASTY ANTERIOR APPROACH;  Surgeon: Leandrew Koyanagi, MD;  Location: [facility];  Service: Orthopedics;  Laterality: Left;    Current Medications: Current Meds  Medication Sig  . amoxicillin (AMOXIL) 500 MG capsule Take 4 pills one hour prior to dental work  . carvedilol (COREG) 3.125 MG tablet Take 1 tablet (3.125 mg total) by mouth 2 (two) times daily.  Marland Kitchen lisinopril (ZESTRIL) 10 MG tablet Take 1 tablet (10 mg total) by mouth 2 (two) times daily.  . potassium  chloride (KLOR-CON) 10 MEQ tablet Take 1 tablet (10 mEq total) by mouth daily. Dx: Hypokalemia  . senna-docusate (SENOKOT S) 8.6-50 MG tablet Take 1-2 tablets by mouth at bedtime as needed.  . [DISCONTINUED] aspirin EC 81 MG tablet Take 1 tablet (81 mg total) by mouth 2 (two) times daily.     Allergies:   Patient has no known allergies.   Social History   Socioeconomic History  . Marital status: Single    Spouse name: Not on file  . Number of children: Not on file  . Years of education: Not on file  . Highest education level: Not on file  Occupational History  . Not on file  Tobacco Use  . Smoking status: Former Research scientist (life sciences)  . Smokeless tobacco: Never Used  . Tobacco comment: Smoked lightly for approx 20 years, quit 1990s  Vaping Use  . Vaping Use: Never used  Substance and Sexual Activity  . Alcohol use: Yes    Comment: 1-2 mixed drinks per week  . Drug use: Yes    Types: Marijuana    Comment: occasional  . Sexual activity: Not on file  Other Topics Concern  . Not on file  Social History Narrative   Lives Home alone. Sister makes all health related decisions. Declined Advanced directive at this time   Social Determinants of Health   Financial Resource Strain: Not on file  Food Insecurity: Not on file  Transportation Needs: Not on file  Physical Activity: Not on file  Stress: Not on file  Social Connections: Not on file     Family History: The patient's family history includes CAD in his father.  ROS:   Please see the history of present illness.    All other systems are reviewed and are negative.   EKGs/Labs/Other Studies Reviewed:    EKG:  EKG is ordered today.  It shows NSR, minor ST-T changes QTc 419 ms.  ECHO February 2022:   1. The aortic valve is tricuspid. There is moderate calcification of the  aortic valve. There is moderate thickening of the aortic valve. Aortic  valve regurgitation is mild. Moderate aortic valve stenosis. Aortic valve  area, by  VTI measures 1.31 cm.  Aortic valve mean gradient measures 25.0 mmHg. Aortic valve Vmax measures  3.41 m/s.  2. Left ventricular ejection fraction, by estimation, is 60 to 65%. The  left ventricle has normal function. The left ventricle has no regional  wall motion abnormalities. There is mild concentric left ventricular  hypertrophy. Left ventricular diastolic  parameters are consistent with Grade II diastolic dysfunction  (pseudonormalization). Elevated left atrial pressure.  3. Right ventricular systolic function is normal. The right ventricular  size is normal. Tricuspid regurgitation signal is inadequate for assessing  PA pressure.  4. The mitral valve is grossly normal. No evidence of mitral valve  regurgitation. No evidence of mitral stenosis.  5. The inferior vena cava is normal in size with greater than 50%  respiratory variability, suggesting right atrial pressure of 3 mmHg.  Comparison(s): No significant change from prior study. Aortic stenosis  remains moderate. Vmax and MG slightly higher (3.4 m/s vs 3.1 m/s; 25 mmHG  vs 20 mmHG).   Recent Labs: 09/29/2019: ALT 30; BUN 13; Creatinine, Ser 0.86; Hemoglobin 16.2; Potassium 4.6; Sodium 137  Recent Lipid Panel    Component Value Date/Time   CHOL 132 09/29/2019 1420   TRIG 74 09/29/2019 1420   HDL 41 09/29/2019 1420   CHOLHDL 3.2 09/29/2019 1420   CHOLHDL 4.6 12/27/2016 0238   VLDL 18 12/27/2016 0238   LDLCALC 76 09/29/2019 1420    Physical Exam:    VS:  BP 124/66   Pulse 64   Ht 6' (1.829 m)   Wt 251 lb (113.9 kg)   SpO2 96%   BMI 34.04 kg/m     Wt Readings from Last 3 Encounters:  08/09/20 251 lb (113.9 kg)  12/30/19 234 lb 12.8 oz (106.5 kg)  09/29/19 214 lb 9.6 oz (97.3 kg)     General: Alert, oriented x3, no distress, obese Head: no evidence of trauma, PERRL, EOMI, no exophtalmos or lid lag, no myxedema, no xanthelasma; normal ears, nose and oropharynx Neck: normal jugular venous pulsations and  no hepatojugular reflux; brisk carotid pulses with bilateral carotid bruits Chest: clear to auscultation, no signs of consolidation by percussion or palpation, normal fremitus, symmetrical and full respiratory excursions Cardiovascular: normal position and quality of the apical impulse, regular rhythm, normal first and second heart sounds, 3/6 Ao ejection murmur, no diastolic murmurs, rubs or gallops Abdomen: no tenderness or distention, no masses by palpation, no abnormal pulsatility or arterial bruits, normal bowel sounds, no hepatosplenomegaly Extremities: no clubbing, cyanosis or edema; 2+ radial, ulnar and brachial pulses bilaterally; 2+ right femoral, posterior tibial and dorsalis pedis pulses; 2+ left femoral, posterior tibial and dorsalis pedis pulses; no subclavian or femoral bruits Neurological: grossly nonfocal Psych: Normal mood and affect   ASSESSMENT:    1. Chronic diastolic heart failure ([place])   2. Coronary artery disease involving native coronary artery of native heart without angina pectoris   3. Aortic valve stenosis, nonrheumatic   4. Bilateral carotid bruits   5. Essential hypertension   6. Dyslipidemia   7. Mild dilation of ascending aorta ([facility])    PLAN:    In order of problems listed above:  1. CHF: Due to ischemic cardiomyopathy, which presented with dyspnea rather than angina.  Systolic dysfunction resolved completely after revascularization.  He does not have symptoms or signs of congestive heart failure 2. CAD: He has never had angina pectoris.  Two-vessel disease treated with percutaneous revascularization in 2018 3. AS: Probably has a bicuspid aortic valve and had only very mild associated dilation of the ascending aorta.  Currently asymptomatic without exertional angina/dyspnea/syncope.  Anticipate need for valve replacement in the next 2-3 years or so.  By that time I think it will be pretty clear that he would be preferred for a tissue prosthesis, discussed  options for TAVR versus surgical aortic valve replacement.  Asked him to remain physically active and call us if he develops exertional complaints.  He should take care of any residual dental problems in the next year or 2 so we do not have to do with those issues when it comes time for aortic valve replacement. 4. Bilateral carotid bruits: Bruits radiating from his aortic valve, no evidence for obstructive lesions on duplex ultrasound September 2019. 5. HTN: Excellent control 6. HLP: Markedly abnormal liver tests while he was taking  atorvastatin, improved after his discontinuation, although some lingering abnormalities in alkaline phosphatase are still present.  He does not want to restart statin due to the severity of his liver test changes.  He used to have an excellent HDL cholesterol back in 2019 when he was exercising more and was leaner.  This has deteriorated back to baseline levels from 2018.  Encouraged him to be more physically active and lose weight.  Recheck lipid profile today.  I am afraid that we might see further deterioration compared to last year.  Target LDL less than 70.  He would be a good candidate for PCSK9 inhibitors instead of statins, due to his liver abnormalities. 7. 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  This has not worsened on echo (maximum aortic root diameter 3.9 cm).  He will require a CT of the chest before we decide on TAVR versus SAVR.    Medication Adjustments/Labs and Tests Ordered: Current medicines are reviewed at length with the patient today.  Concerns regarding medicines are outlined above.  Orders Placed This Encounter  Procedures  . Lipid panel  . Comprehensive metabolic panel  . EKG 12-Lead  . ECHOCARDIOGRAM COMPLETE   Meds ordered this encounter  Medications  . aspirin EC 81 MG tablet    Sig: Take 1 tablet (81 mg total) by mouth daily.    Dispense:  30 tablet    Refill:  0    Signed, Sanda Klein, MD   08/09/2020 9:09 AM    [place]

## 2020-08-09 NOTE — Patient Instructions (Signed)
Medication Instructions:  DECREASE the Aspirin to 81 mg once daily  *If you need a refill on your cardiac medications before your next appointment, please call your pharmacy*   Lab Work: Your provider would like for you to have the following labs today: Lipid and CMET  If you have labs (blood work) drawn today and your tests are completely normal, you will receive your results only by: Marland Kitchen MyChart Message (if you have MyChart) OR . A paper copy in the mail If you have any lab test that is abnormal or we need to change your treatment, we will call you to review the results.   Testing/Procedures: Your physician has requested that you have an echocardiogram in February 2023. Echocardiography is a painless test that uses sound waves to create images of your heart. It provides your doctor with information about the size and shape of your heart and how well your heart's chambers and valves are working. You may receive an ultrasound enhancing agent through an IV if needed to better visualize your heart during the echo.This procedure takes approximately one hour. There are no restrictions for this procedure. This will take place at the [address].     Follow-Up: At [facility], you and your health needs are our priority.  As part of our continuing mission to provide you with exceptional heart care, we have created designated Provider Care Teams.  These Care Teams include your primary Cardiologist (physician) and Advanced Practice Providers (APPs -  Physician Assistants and Nurse Practitioners) who all work together to provide you with the care you need, when you need it.  We recommend signing up for the patient portal called "MyChart".  Sign up information is provided on this After Visit Summary.  MyChart is used to connect with patients for Virtual Visits (Telemedicine).  Patients are able to view lab/test results, encounter notes, upcoming appointments, etc.  Non-urgent messages  can be sent to your provider as well.   To learn more about what you can do with MyChart, go to [email or web address].    Your next appointment:   12 month(s)  The format for your next appointment:   In Person  Provider:   You may see Sanda Klein, MD or one of the following Advanced Practice Providers on your designated Care Team:    Almyra Deforest, PA-C  Fabian Sharp, PA-C or   Roby Lofts, [place]

## 2020-08-20 ENCOUNTER — Other Ambulatory Visit: Payer: Self-pay | Admitting: Internal Medicine

## 2020-08-20 DIAGNOSIS — I10 Essential (primary) hypertension: Secondary | ICD-10-CM

## 2020-08-20 NOTE — Telephone Encounter (Signed)
Requested Prescriptions  Pending Prescriptions Disp Refills  . potassium chloride (KLOR-CON) 10 MEQ tablet [Pharmacy Med Name: POTASSIUM CL ER 10 MEQ TABLET] 90 tablet 1    Sig: TAKE 1 TABLET (10 MEQ TOTAL) BY MOUTH DAILY. DX: HYPOKALEMIA     Endocrinology:  Minerals - Potassium Supplementation Passed - 08/20/2020  1:35 AM      Passed - K in normal range and within 360 days    Potassium  Date Value Ref Range Status  09/29/2019 4.6 3.5 - 5.2 mmol/L Final         Passed - Cr in normal range and within 360 days    Creatinine, Ser  Date Value Ref Range Status  09/29/2019 0.86 0.76 - 1.27 mg/dL Final         Passed - Valid encounter within last 12 months    Recent Outpatient Visits          3 months ago Essential hypertension   [place], MD   7 months ago Need for zoster vaccination   [place], Jarome [place]atin, RPH-CPP   7 months ago Essential hypertension   [place], Deborah B, MD   10 months ago Essential hypertension   [place], [place], NP   1 year ago Essential hypertension   [place], [place], NP      Future Appointments            In 1 week Ladell Pier, MD [place]   In 9 months Erlinda Hong, Marylynn Pearson, MD [facility]

## 2020-08-27 ENCOUNTER — Ambulatory Visit: Payer: Medicaid Other | Attending: Internal Medicine | Admitting: Internal Medicine

## 2020-08-27 ENCOUNTER — Encounter: Payer: Self-pay | Admitting: Internal Medicine

## 2020-08-27 ENCOUNTER — Other Ambulatory Visit: Payer: Self-pay

## 2020-08-27 VITALS — BP 120/78 | HR 59 | Resp 16 | Wt 241.8 lb

## 2020-08-27 DIAGNOSIS — I10 Essential (primary) hypertension: Secondary | ICD-10-CM

## 2020-08-27 DIAGNOSIS — K719 Toxic liver disease, unspecified: Secondary | ICD-10-CM | POA: Insufficient documentation

## 2020-08-27 DIAGNOSIS — T466X5A Adverse effect of antihyperlipidemic and antiarteriosclerotic drugs, initial encounter: Secondary | ICD-10-CM

## 2020-08-27 DIAGNOSIS — F329 Major depressive disorder, single episode, unspecified: Secondary | ICD-10-CM | POA: Diagnosis not present

## 2020-08-27 DIAGNOSIS — E669 Obesity, unspecified: Secondary | ICD-10-CM | POA: Diagnosis not present

## 2020-08-27 DIAGNOSIS — I2510 Atherosclerotic heart disease of native coronary artery without angina pectoris: Secondary | ICD-10-CM

## 2020-08-27 DIAGNOSIS — Z1211 Encounter for screening for malignant neoplasm of colon: Secondary | ICD-10-CM

## 2020-08-27 NOTE — Progress Notes (Signed)
Patient ID: Joshua [name], male    DOB: [birth year]  MRN: [id number]  CC: Hypertension   Subjective: Joshua [name] is a 60 y.o. male who presents for chronic disease management. His concerns today include:  Patient with history of HTN, OA BL hips, prediabetes(last A1C 10/2018 nl), CAD [with stent to LAD, RCA],EF55-60%%,bradycardia ,bicuspid aortic valve,mod AS,milddilated ascending aorta.  Obesity: loss 10 lbs since visit with cardiologist 08/09/2020 -he reports he has cut back on the amount he eats -"I need to exercise more."  Reports his goal is to get in better shape.  CAD/HTN/bicuspid aortic valve:  No CP/SOB/LE edema/HA/dizziness Compliant with meds and low salt diet his medications include lisinopril, carvedilol and aspirin. He has lab slip to have blood test done including lipid profile and chemistry.  He states he would prefer to come fasting to the lab and he plans to do that tomorrow but will be having it done at the cardiologist office. Saw his cardiologist the end of last month.  He states that in about 2 years patient may need aortic valve replacement.  He encourage patient to make sure that he sees the dentist and has any issues resolved preemptively. 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  Pos Depression screen today: reports he worry about things at time.  He does not give details but states that he worries about things that he cannot do anything about and he is working on that.  Denies any suicidal ideation.  HM:  Still has cologuard box ant home but has not done it as yet Patient Active Problem List   Diagnosis Date Noted  . COVID-19 vaccine regimen to maintain immunity completed 04/27/2020  . Statin intolerance 12/10/2018  . 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/15/2018  . Chronic left shoulder pain 06/08/2018  . Status post total hip replacement, right 04/26/2018  . Mild dilation of  ascending aorta (HCC) 12/26/2017  . Dyslipidemia 12/26/2017  . Bilateral carotid bruits 12/26/2017  . Nocturnal hypoxemia 06/29/2017  . Primary osteoarthritis of both hips 06/29/2017  . Coronary artery disease involving native coronary artery of native heart without angina pectoris 03/31/2017  . Ischemic cardiomyopathy 03/31/2017  . Marijuana use 03/31/2017  . Prediabetes 03/31/2017  . Aortic valve insufficiency 12/28/2016  . Mitral valve regurgitation 12/28/2016  . Obesity 12/28/2016  . Aortic valve stenosis, nonrheumatic   . Essential hypertension      Current Outpatient Medications on File Prior to Visit  Medication Sig Dispense Refill  . amoxicillin (AMOXIL) 500 MG capsule Take 4 pills one hour prior to dental work 8 capsule 2  . aspirin EC 81 MG tablet Take 1 tablet (81 mg total) by mouth daily. 30 tablet 0  . carvedilol (COREG) 3.125 MG tablet Take 1 tablet (3.125 mg total) by mouth 2 (two) times daily. 180 tablet 1  . lisinopril (ZESTRIL) 10 MG tablet Take 1 tablet (10 mg total) by mouth 2 (two) times daily. 180 tablet 1  . potassium chloride (KLOR-CON) 10 MEQ tablet TAKE 1 TABLET (10 MEQ TOTAL) BY MOUTH DAILY. DX: HYPOKALEMIA 90 tablet 1  . senna-docusate (SENOKOT S) 8.6-50 MG tablet Take 1-2 tablets by mouth at bedtime as needed. 30 tablet 1   No current facility-administered medications on file prior to visit.    No Known Allergies  Social History   Socioeconomic History  . Marital status: Single    Spouse name: Not on file  .  Number of children: Not on file  . Years of education: Not on file  . Highest education level: Not on file  Occupational History  . Not on file  Tobacco Use  . Smoking status: Former Research scientist (life sciences)  . Smokeless tobacco: Never Used  . Tobacco comment: Smoked lightly for approx 20 years, quit 1990s  Vaping Use  . Vaping Use: Never used  Substance and Sexual Activity  . Alcohol use: Yes    Comment: 1-2 mixed drinks per week  . Drug use: Yes     Types: Marijuana    Comment: occasional  . Sexual activity: Not on file  Other Topics Concern  . Not on file  Social History Narrative   Lives Home alone. Sister makes all health related decisions. Declined Advanced directive at this time   Social Determinants of Health   Financial Resource Strain: Not on file  Food Insecurity: Not on file  Transportation Needs: Not on file  Physical Activity: Not on file  Stress: Not on file  Social Connections: Not on file  Intimate Partner Violence: Not on file    Family History  Problem Relation Age of Onset  . CAD Father        MI/CABG age 37    Past Surgical History:  Procedure Laterality Date  . CORONARY STENT INTERVENTION N/A 12/29/2016   Procedure: CORONARY STENT INTERVENTION;  Surgeon: Jettie Booze, MD;  Location: [facility];  Service: Cardiovascular;  Laterality: N/A;  . RIGHT/LEFT HEART CATH AND CORONARY ANGIOGRAPHY N/A 12/29/2016   Procedure: RIGHT/LEFT HEART CATH AND CORONARY ANGIOGRAPHY;  Surgeon: Jettie Booze, MD;  Location: [facility];  Service: Cardiovascular;  Laterality: N/A;  . TONSILLECTOMY    . TOTAL HIP ARTHROPLASTY Right 04/26/2018   Procedure: RIGHT TOTAL HIP ARTHROPLASTY ANTERIOR APPROACH;  Surgeon: Leandrew Koyanagi, MD;  Location: [facility];  Service: Orthopedics;  Laterality: Right;  . TOTAL HIP ARTHROPLASTY Left 11/15/2018  . TOTAL HIP ARTHROPLASTY Left 11/15/2018   Procedure: LEFT TOTAL HIP ARTHROPLASTY ANTERIOR APPROACH;  Surgeon: Leandrew Koyanagi, MD;  Location: [facility];  Service: Orthopedics;  Laterality: Left;    ROS: Review of Systems Negative except as stated above  PHYSICAL EXAM: BP 120/78   Pulse (!) 59   Resp 16   Wt 241 lb 12.8 oz (109.7 kg)   SpO2 97%   BMI 32.79 kg/m  Wt Readings from Last 3 Encounters:  08/27/20 241 lb 12.8 oz (109.7 kg)  08/09/20 251 lb (113.9 kg)  12/30/19 234 lb 12.8 oz (106.5 kg)    Physical Exam  General appearance - alert, well appearing, and  in no distress Mental status - normal mood, behavior, speech, dress, motor activity, and thought processes Neck - supple, no significant adenopathy, posititve BL carotid bruits Chest - clear to auscultation, no wheezes, rales or rhonchi, symmetric air entry Heart - normal rate, regular rhythm, normal S1, S2.  2/6 SEM LSB Extremities - peripheral pulses normal, no pedal edema, no clubbing or cyanosis Depression screen [facility] 2/9 08/27/2020 12/30/2019 08/24/2018  Decreased Interest 1 1 0  Down, Depressed, Hopeless 1 0 1  PHQ - 2 Score 2 1 1   Altered sleeping 2 - 0  Tired, decreased energy 1 - 0  Change in appetite 2 - 0  Feeling bad or failure about yourself  1 - 0  Trouble concentrating 1 - 0  Moving slowly or fidgety/restless 1 - 0  Suicidal thoughts 0 - 0  PHQ-9 Score  10 - 1   GAD 7 : Generalized Anxiety Score 08/27/2020 12/30/2019 08/24/2018 02/05/2018  Nervous, Anxious, on Edge 1 0 0 0  Control/stop worrying 0 0 0 0  Worry too much - different things 0 0 0 0  Trouble relaxing 0 1 0 0  Restless 0 0 0 0  Easily annoyed or irritable 0 0 0 0  Afraid - awful might happen 0 0 0 0  Total GAD 7 Score 1 1 0 0      CMP Latest Ref Rng & Units 09/29/2019 02/24/2019 11/22/2018  Glucose 65 - 99 mg/dL 115(H) 100(H) 118(H)  BUN 8 - 27 mg/dL 13 17 16   Creatinine 0.76 - 1.27 mg/dL 0.86 0.86 0.76  Sodium 134 - 144 mmol/L 137 140 136  Potassium 3.5 - 5.2 mmol/L 4.6 4.8 4.2  Chloride 96 - 106 mmol/L 100 102 102  CO2 20 - 29 mmol/L 22 25 24   Calcium 8.6 - 10.2 mg/dL 9.4 9.4 8.8(L)  Total Protein 6.0 - 8.5 g/dL 7.2 7.2 6.1(L)  Total Bilirubin 0.0 - 1.2 mg/dL 1.0 0.3 0.8  Alkaline Phos 48 - 121 IU/L 122(H) 154(H) 164(H)  AST 0 - 40 IU/L 19 18 24   ALT 0 - 44 IU/L 30 25 69(H)   Lipid Panel     Component Value Date/Time   CHOL 132 09/29/2019 1420   TRIG 74 09/29/2019 1420   HDL 41 09/29/2019 1420   CHOLHDL 3.2 09/29/2019 1420   CHOLHDL 4.6 12/27/2016 0238   VLDL 18 12/27/2016 0238   LDLCALC 76  09/29/2019 1420    CBC    Component Value Date/Time   WBC 7.3 09/29/2019 1420   WBC 7.7 11/22/2018 1520   RBC 5.24 09/29/2019 1420   RBC 4.20 (L) 11/22/2018 1520   HGB 16.2 09/29/2019 1420   HCT 47.0 09/29/2019 1420   PLT 204 11/22/2018 1520   PLT 188 01/05/2018 1359   MCV 90 09/29/2019 1420   MCH 30.9 09/29/2019 1420   MCH 31.7 11/22/2018 1520   MCHC 34.5 09/29/2019 1420   MCHC 33.3 11/22/2018 1520   RDW 13.6 09/29/2019 1420   LYMPHSABS 1.8 09/29/2019 1420   MONOABS 0.6 11/22/2018 1520   EOSABS 0.0 09/29/2019 1420   BASOSABS 0.0 09/29/2019 1420    ASSESSMENT AND PLAN: 1. Essential hypertension At goal.  Continue carvedilol and lisinopril.  Continue low-salt diet.  2. Obesity (BMI 30.0-34.9) Commended him on weight loss and improving his eating habits.  Encouraged him to take the next step in starting an exercise routine.  3. Coronary artery disease involving native coronary artery of native heart without angina pectoris Stable.  Continue aspirin, carvedilol.  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  He will be having repeat lipid profile done through his cardiologist.  Cardiologist mentioned putting him on PSCK9 I agent if LDL comes back not at goal.  4. Reactive depression Patient feels that he can work through this on his own.  He declines referral to behavioral health and also declines being placed on medication.  5. Screening for colon cancer Strongly encouraged him to use the Cologuard test and send it back in.  6.  Hepatotoxicity due to statin drug See #3 above.   Patient was given the opportunity to ask questions.  Patient verbalized understanding of the plan and was able to repeat key elements of the plan.   No orders of the defined types were placed in this  encounter.    Requested Prescriptions    No prescriptions requested or ordered in this encounter    No follow-ups on file.  Karle Plumber, MD,  FACP

## 2020-08-27 NOTE — Patient Instructions (Signed)
I encourage you to set an exercise goal and start doing it.

## 2020-09-28 ENCOUNTER — Other Ambulatory Visit: Payer: Self-pay | Admitting: Internal Medicine

## 2020-09-28 DIAGNOSIS — I10 Essential (primary) hypertension: Secondary | ICD-10-CM

## 2020-11-22 IMAGING — DX DG PORTABLE PELVIS
1 series · 1 of 1 positions shown · non-contrast
Comparison: 04/26/2018.

CLINICAL DATA: Right hip arthroplasty.

EXAM:
PORTABLE PELVIS 1-2 VIEWS

[pelvis ap]
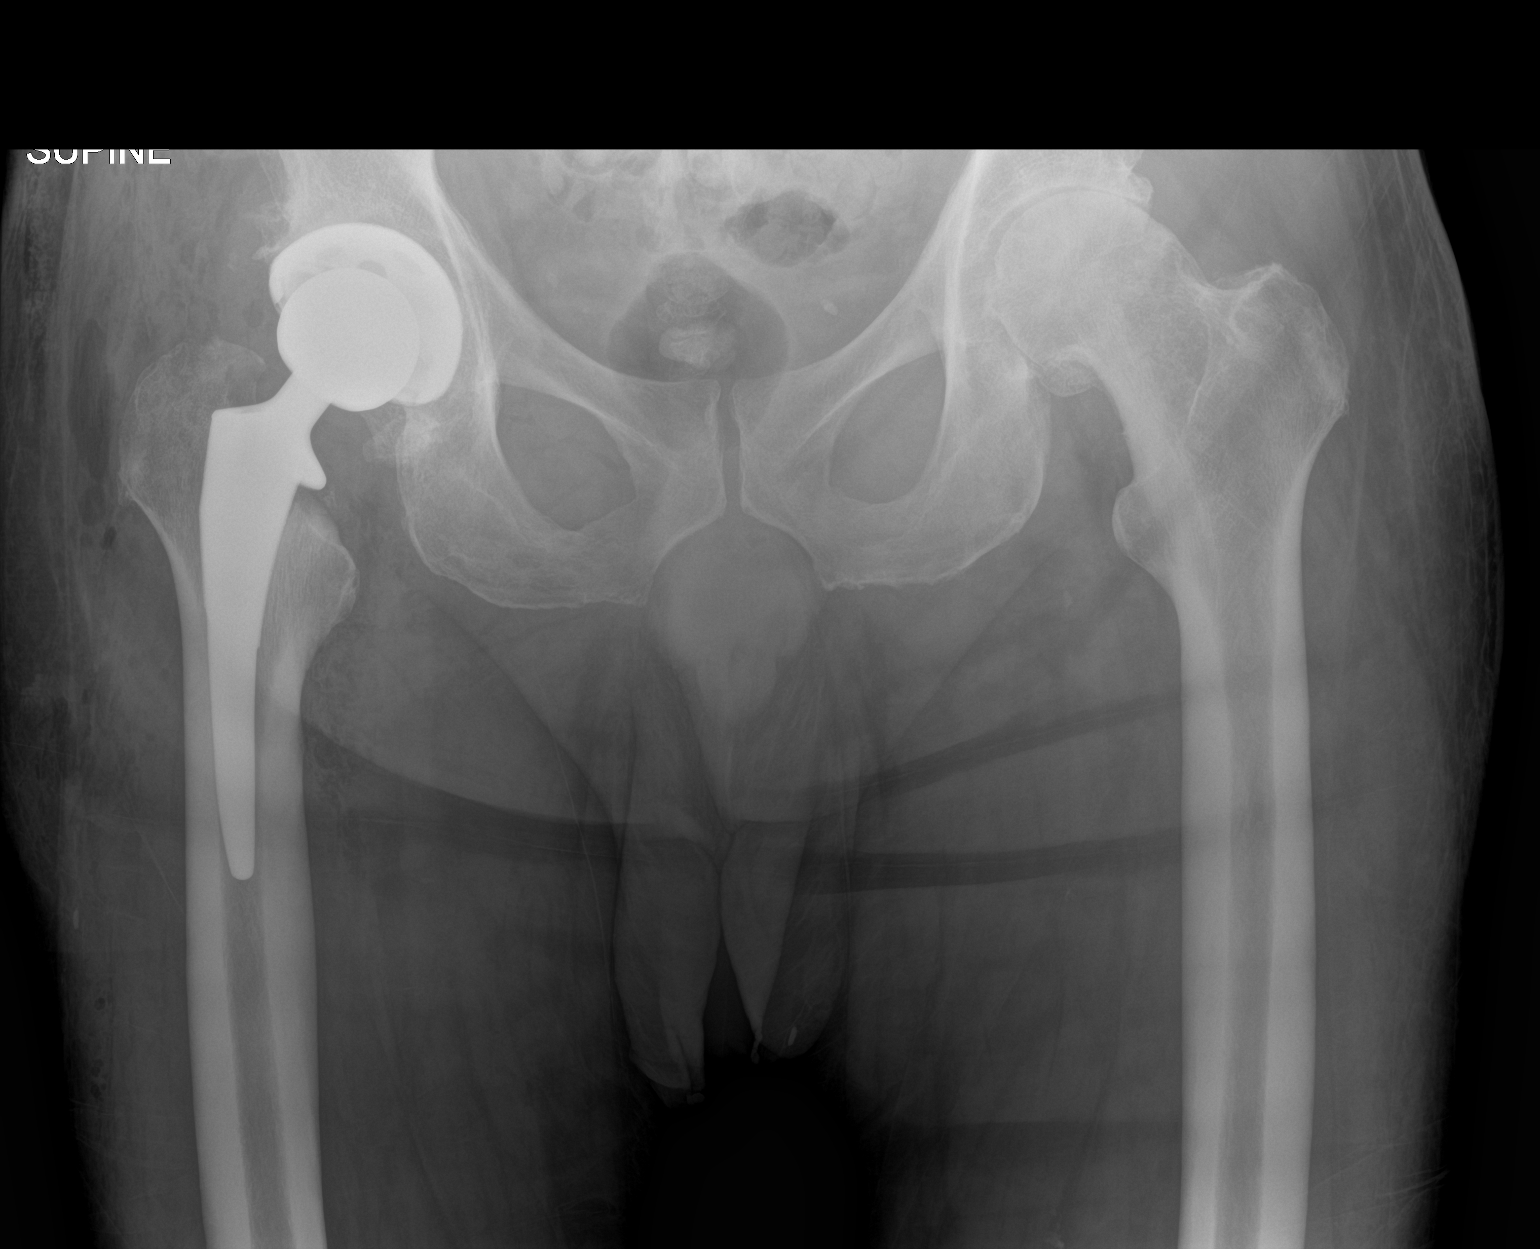

[1 of 1 positions shown; findings below may reference images not displayed]

FINDINGS: Total right hip replacement. Hardware intact. Anatomic alignment.
Degenerative changes left hip. No acute bony abnormality. Pelvic
calcifications consistent phleboliths.
IMPRESSION: Total right hip replacement with anatomic alignment.

## 2020-11-22 IMAGING — RF DG HIP (WITH PELVIS) OPERATIVE*R*
1 series · 4 of 4 positions shown · non-contrast
Comparison: None.

CLINICAL DATA: Right hip replacement

EXAM:
DG C-ARM 61-120 MIN; OPERATIVE RIGHT HIP WITH PELVIS

[Series 1: unknown protocol · 0.20mm/px · 4 of 4 slices shown]
[im 1/4]
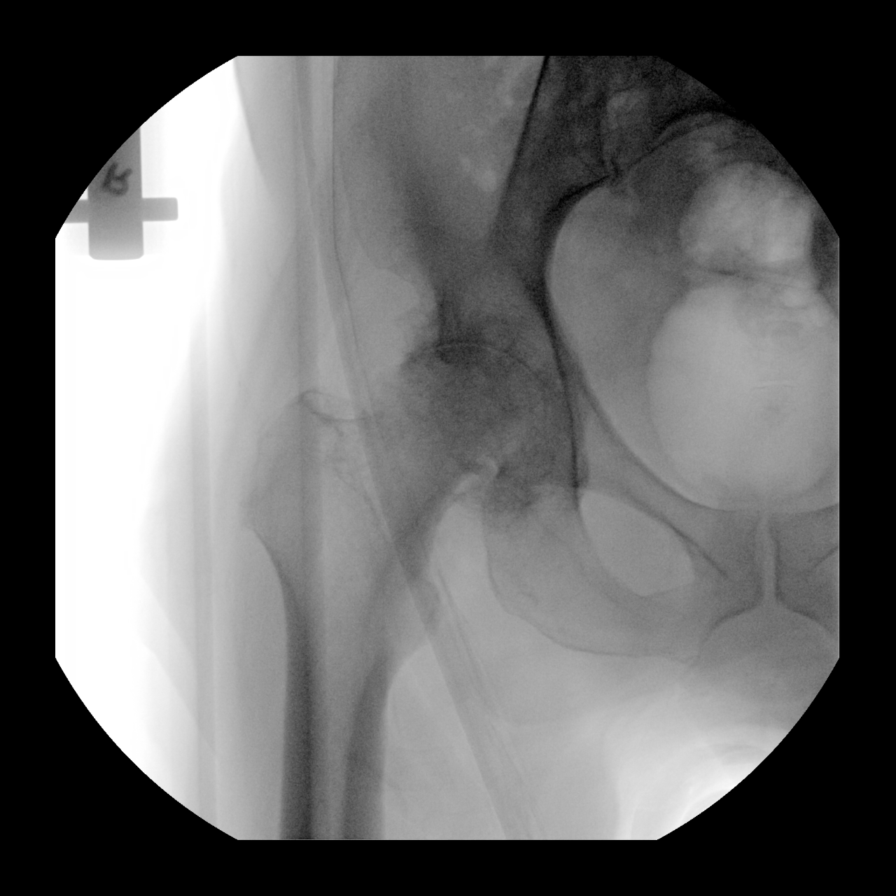
[im 2/4]
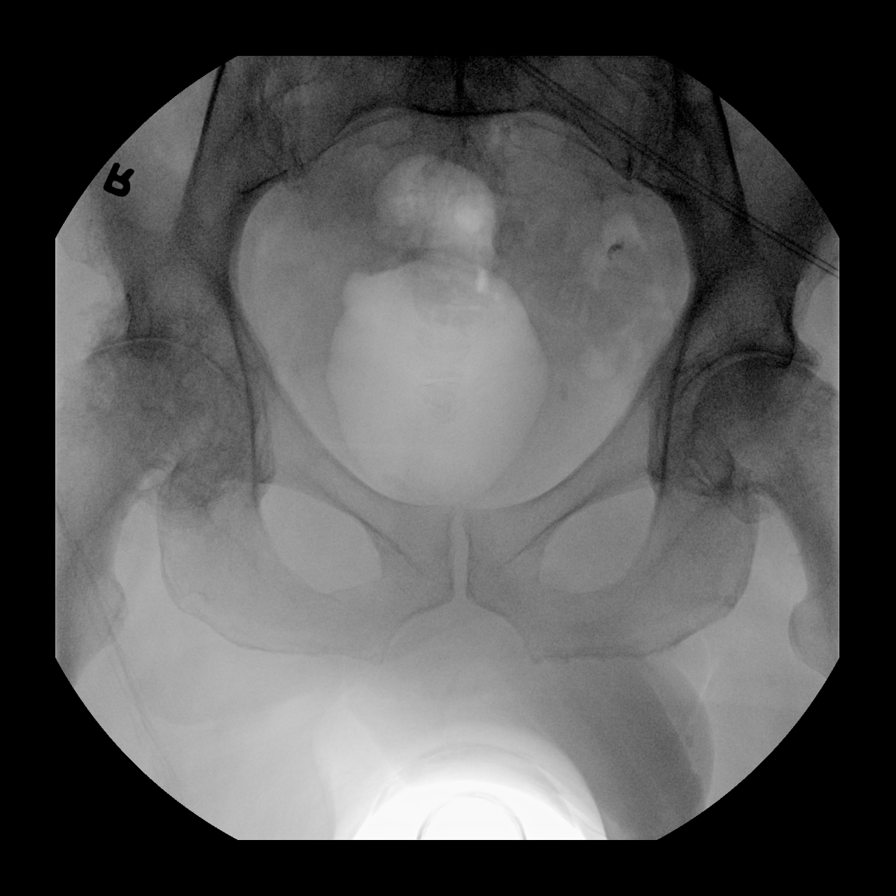
[im 3/4]
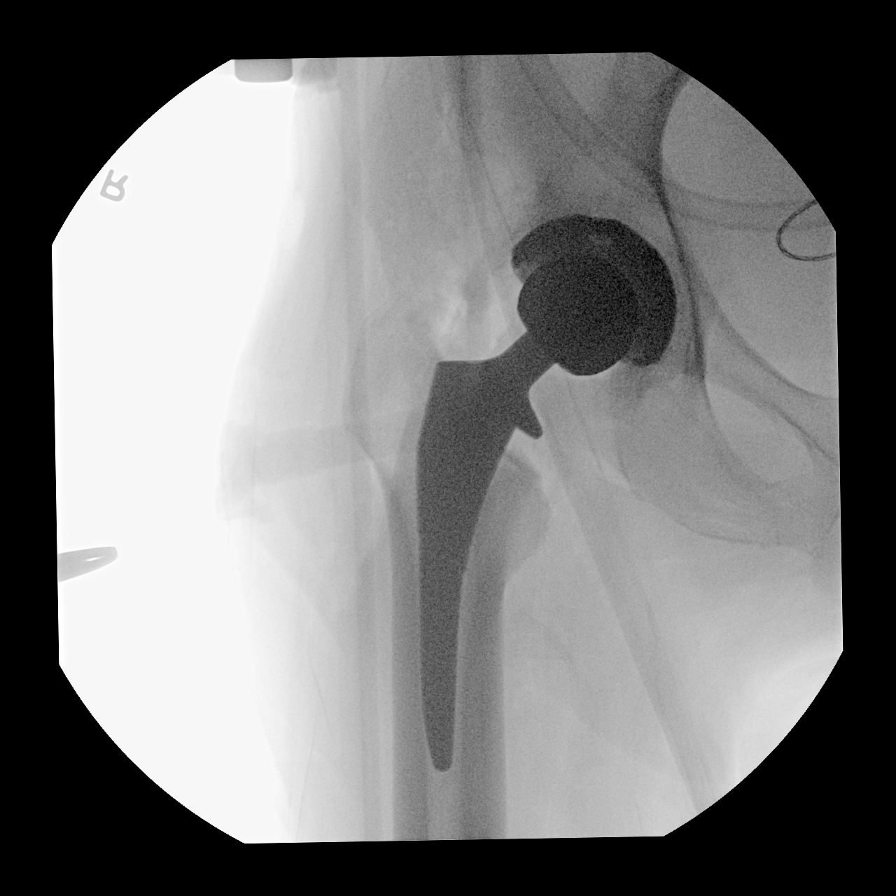
[im 4/4]
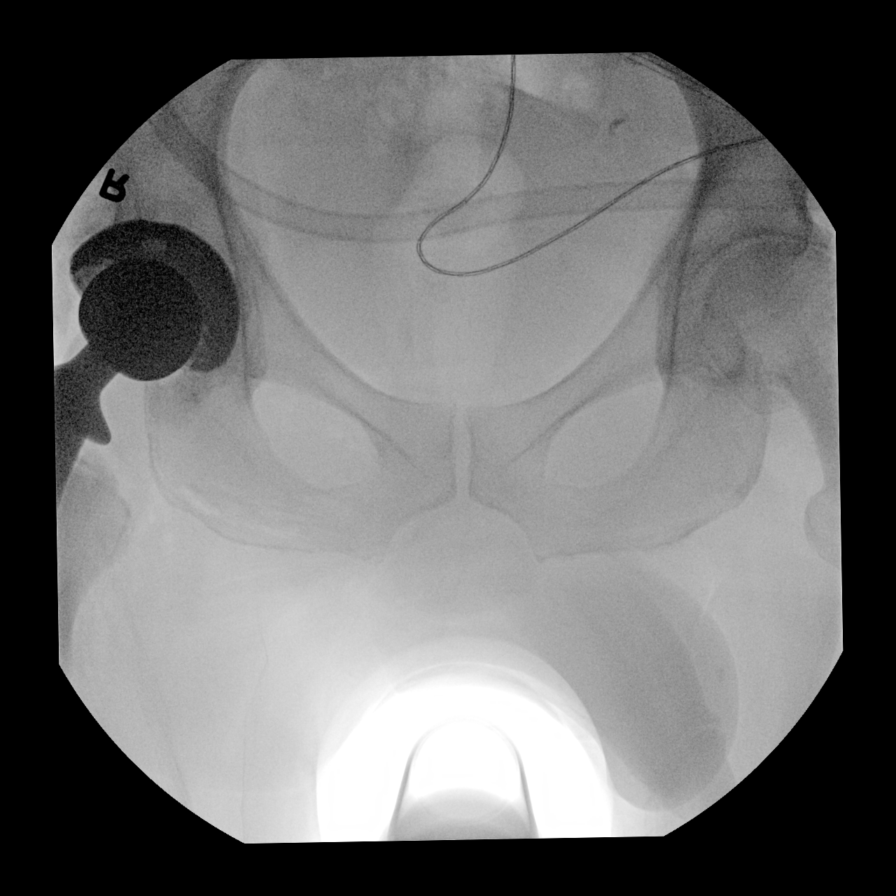

[4 of 4 positions shown; findings below may reference images not displayed]

FLUOROSCOPY TIME:  Fluoroscopy Time:  39 seconds

Radiation Exposure Index (if provided by the fluoroscopic device):
2.5 mGy

Number of Acquired Spot Images: 4
FINDINGS: Initial images demonstrates severe degenerative changes of the right
hip joint. Subsequent right hip replacement is noted. No acute
fracture or dislocation is seen.
IMPRESSION: Right hip replacement

## 2020-12-28 ENCOUNTER — Ambulatory Visit: Payer: Medicaid Other | Attending: Internal Medicine | Admitting: Internal Medicine

## 2020-12-28 ENCOUNTER — Other Ambulatory Visit: Payer: Self-pay

## 2020-12-28 ENCOUNTER — Encounter: Payer: Self-pay | Admitting: Internal Medicine

## 2020-12-28 VITALS — BP 150/80 | HR 64 | Resp 16 | Wt 232.2 lb

## 2020-12-28 DIAGNOSIS — R0981 Nasal congestion: Secondary | ICD-10-CM | POA: Diagnosis not present

## 2020-12-28 DIAGNOSIS — Z7982 Long term (current) use of aspirin: Secondary | ICD-10-CM | POA: Insufficient documentation

## 2020-12-28 DIAGNOSIS — Z8249 Family history of ischemic heart disease and other diseases of the circulatory system: Secondary | ICD-10-CM | POA: Diagnosis not present

## 2020-12-28 DIAGNOSIS — R7303 Prediabetes: Secondary | ICD-10-CM | POA: Diagnosis not present

## 2020-12-28 DIAGNOSIS — E6609 Other obesity due to excess calories: Secondary | ICD-10-CM | POA: Diagnosis not present

## 2020-12-28 DIAGNOSIS — Z6830 Body mass index (BMI) 30.0-30.9, adult: Secondary | ICD-10-CM

## 2020-12-28 DIAGNOSIS — Z23 Encounter for immunization: Secondary | ICD-10-CM | POA: Insufficient documentation

## 2020-12-28 DIAGNOSIS — Z125 Encounter for screening for malignant neoplasm of prostate: Secondary | ICD-10-CM

## 2020-12-28 DIAGNOSIS — I10 Essential (primary) hypertension: Secondary | ICD-10-CM | POA: Insufficient documentation

## 2020-12-28 DIAGNOSIS — Z955 Presence of coronary angioplasty implant and graft: Secondary | ICD-10-CM | POA: Insufficient documentation

## 2020-12-28 DIAGNOSIS — Z1211 Encounter for screening for malignant neoplasm of colon: Secondary | ICD-10-CM

## 2020-12-28 DIAGNOSIS — I2510 Atherosclerotic heart disease of native coronary artery without angina pectoris: Secondary | ICD-10-CM

## 2020-12-28 DIAGNOSIS — Z87891 Personal history of nicotine dependence: Secondary | ICD-10-CM | POA: Insufficient documentation

## 2020-12-28 DIAGNOSIS — Z6831 Body mass index (BMI) 31.0-31.9, adult: Secondary | ICD-10-CM | POA: Insufficient documentation

## 2020-12-28 DIAGNOSIS — Z79899 Other long term (current) drug therapy: Secondary | ICD-10-CM | POA: Insufficient documentation

## 2020-12-28 MED ORDER — AMLODIPINE BESYLATE 5 MG PO TABS: 5.0000 mg | ORAL_TABLET | Freq: Every day | ORAL | 1 refills | Status: DC

## 2020-12-28 MED ORDER — FLUTICASONE PROPIONATE 50 MCG/ACT NA SUSP: 1.0000 | Freq: Every day | NASAL | 0 refills | Status: DC | PRN

## 2020-12-28 NOTE — Progress Notes (Signed)
Patient ID: Joshua [name], male    DOB: Jan 25, 1961  MRN: [id number]  CC: Hypertension   Subjective: Joshua [name] is a 60 y.o. male who presents for chronic ds management His concerns today include:  Patient with history of HTN, OA BL hips, prediabetes (last A1C 10/2018 nl), CAD [with stent to LAD, RCA], EF 55-60%%, bradycardia , bicuspid aortic valve, mod AS, mild dilated ascending aorta.  HM:  Due for flu shot today.  Plans to get 4 COVID shot.  Pt still has not mail back the cologuard test.  States he had misplaced it  but recently found.  Agrees to prostate cancer screening with PSA level today.  Obesity:  loss an additional 9 lbs since last visit with me 08/2020. Feels good.  Loss is intentional.  "I've been watching what I eat." Getting in some exercise.  HYPERTENSION/CAD/mod AS Currently taking: see medication list.  Took Lisinopril/Coreg already this a.m Med Adherence: '[x]'$  Yes    '[]'$  No Medication side effects: '[]'$  Yes    '[x]'$  No Adherence with salt restriction: '[x]'$  Yes    '[]'$  No Home Monitoring?: '[]'$  Yes    '[x]'$  No - needs to get new batteries for his device.  Monitoring Frequency:  Home BP results range:  SOB? '[]'$  Yes    '[x]'$  No Chest Pain?: '[]'$  Yes    '[x]'$  No Leg swelling?: '[]'$  Yes    '[x]'$  No Headaches?: '[]'$  Yes    '[x]'$  No Dizziness? '[]'$  Yes    '[x]'$  No Comments:   C/o running nose and nasal congestion this a.m Patient Active Problem List   Diagnosis Date Noted   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   COVID-19 vaccine regimen to maintain immunity completed 04/27/2020   Statin intolerance 12/10/2018   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/15/2018   Chronic left shoulder pain 06/08/2018   Status post total hip replacement, right 04/26/2018   Mild dilation of ascending aorta ([place]) 12/26/2017   Dyslipidemia 12/26/2017   Bilateral carotid bruits 12/26/2017   Nocturnal hypoxemia 06/29/2017   Primary osteoarthritis of both hips 06/29/2017   Coronary artery  disease involving native coronary artery of native heart without angina pectoris 03/31/2017   Ischemic cardiomyopathy 03/31/2017   Marijuana use 03/31/2017   Prediabetes 03/31/2017   Aortic valve insufficiency 12/28/2016   Mitral valve regurgitation 12/28/2016   Obesity 12/28/2016   Aortic valve stenosis, nonrheumatic    Essential hypertension      Current Outpatient Medications on File Prior to Visit  Medication Sig Dispense Refill   amoxicillin (AMOXIL) 500 MG capsule Take 4 pills one hour prior to dental work 8 capsule 2   aspirin EC 81 MG tablet Take 1 tablet (81 mg total) by mouth daily. 30 tablet 0   carvedilol (COREG) 3.125 MG tablet TAKE 1 TABLET (3.125 MG TOTAL) BY MOUTH 2 (TWO) TIMES DAILY. 180 tablet 1   lisinopril (ZESTRIL) 10 MG tablet Take 1 tablet (10 mg total) by mouth 2 (two) times daily. 180 tablet 1   potassium chloride (KLOR-CON) 10 MEQ tablet TAKE 1 TABLET (10 MEQ TOTAL) BY MOUTH DAILY. DX: HYPOKALEMIA 90 tablet 1   senna-docusate (SENOKOT S) 8.6-50 MG tablet Take 1-2 tablets by mouth at bedtime as needed. 30 tablet 1   No current facility-administered medications on file prior to visit.    No Known Allergies  Social History   Socioeconomic History   Marital status: Single    Spouse name: Not on file  Number of children: Not on file   Years of education: Not on file   Highest education level: Not on file  Occupational History   Not on file  Tobacco Use   Smoking status: Former   Smokeless tobacco: Never   Tobacco comments:    Smoked lightly for approx 20 years, quit 1990s  Vaping Use   Vaping Use: Never used  Substance and Sexual Activity   Alcohol use: Yes    Comment: 1-2 mixed drinks per week   Drug use: Yes    Types: Marijuana    Comment: occasional   Sexual activity: Not on file  Other Topics Concern   Not on file  Social History Narrative   Lives Home alone. Sister makes all health related decisions. Declined Advanced directive at this  time   Social Determinants of Health   Financial Resource Strain: Not on file  Food Insecurity: Not on file  Transportation Needs: Not on file  Physical Activity: Not on file  Stress: Not on file  Social Connections: Not on file  Intimate Partner Violence: Not on file    Family History  Problem Relation Age of Onset   CAD Father        MI/CABG age 75    Past Surgical History:  Procedure Laterality Date   CORONARY STENT INTERVENTION N/A 12/29/2016   Procedure: CORONARY STENT INTERVENTION;  Surgeon: Jettie Booze, MD;  Location: [facility];  Service: Cardiovascular;  Laterality: N/A;   RIGHT/LEFT HEART CATH AND CORONARY ANGIOGRAPHY N/A 12/29/2016   Procedure: RIGHT/LEFT HEART CATH AND CORONARY ANGIOGRAPHY;  Surgeon: Jettie Booze, MD;  Location: [facility];  Service: Cardiovascular;  Laterality: N/A;   TONSILLECTOMY     TOTAL HIP ARTHROPLASTY Right 04/26/2018   Procedure: RIGHT TOTAL HIP ARTHROPLASTY ANTERIOR APPROACH;  Surgeon: Leandrew Koyanagi, MD;  Location: [facility];  Service: Orthopedics;  Laterality: Right;   TOTAL HIP ARTHROPLASTY Left 11/15/2018   TOTAL HIP ARTHROPLASTY Left 11/15/2018   Procedure: LEFT TOTAL HIP ARTHROPLASTY ANTERIOR APPROACH;  Surgeon: Leandrew Koyanagi, MD;  Location: [facility];  Service: Orthopedics;  Laterality: Left;    ROS: Review of Systems Negative except as stated above  PHYSICAL EXAM: BP (!) 157/92   Pulse 64   Resp 16   Wt 232 lb 3.2 oz (105.3 kg)   SpO2 94%   BMI 31.49 kg/m   Wt Readings from Last 3 Encounters:  12/28/20 232 lb 3.2 oz (105.3 kg)  08/27/20 241 lb 12.8 oz (109.7 kg)  08/09/20 251 lb (113.9 kg)    Physical Exam  General appearance - alert, well appearing, and in no distress Mental status - alert, oriented to person, place, and time Nose:-Clear mucus in both nose. Neck - supple, no significant adenopathy Chest - clear to auscultation, no wheezes, rales or rhonchi, symmetric air entry Heart -regular  rate rhythm.  2/6 systolic ejection murmur along the left sternal border Extremities - peripheral pulses normal, no pedal edema, no clubbing or cyanosis  CMP Latest Ref Rng & Units 09/29/2019 02/24/2019 11/22/2018  Glucose 65 - 99 mg/dL 115(H) 100(H) 118(H)  BUN 8 - 27 mg/dL '13 17 16  '$ Creatinine 0.76 - 1.27 mg/dL 0.86 0.86 0.76  Sodium 134 - 144 mmol/L 137 140 136  Potassium 3.5 - 5.2 mmol/L 4.6 4.8 4.2  Chloride 96 - 106 mmol/L 100 102 102  CO2 20 - 29 mmol/L '22 25 24  '$ Calcium 8.6 - 10.2 mg/dL 9.4 9.4  8.8(L)  Total Protein 6.0 - 8.5 g/dL 7.2 7.2 6.1(L)  Total Bilirubin 0.0 - 1.2 mg/dL 1.0 0.3 0.8  Alkaline Phos 48 - 121 IU/L 122(H) 154(H) 164(H)  AST 0 - 40 IU/L '19 18 24  '$ ALT 0 - 44 IU/L 30 25 69(H)   Lipid Panel     Component Value Date/Time   CHOL 132 09/29/2019 1420   TRIG 74 09/29/2019 1420   HDL 41 09/29/2019 1420   CHOLHDL 3.2 09/29/2019 1420   CHOLHDL 4.6 12/27/2016 0238   VLDL 18 12/27/2016 0238   LDLCALC 76 09/29/2019 1420    CBC    Component Value Date/Time   WBC 7.3 09/29/2019 1420   WBC 7.7 11/22/2018 1520   RBC 5.24 09/29/2019 1420   RBC 4.20 (L) 11/22/2018 1520   HGB 16.2 09/29/2019 1420   HCT 47.0 09/29/2019 1420   PLT 204 11/22/2018 1520   PLT 188 01/05/2018 1359   MCV 90 09/29/2019 1420   MCH 30.9 09/29/2019 1420   MCH 31.7 11/22/2018 1520   MCHC 34.5 09/29/2019 1420   MCHC 33.3 11/22/2018 1520   RDW 13.6 09/29/2019 1420   LYMPHSABS 1.8 09/29/2019 1420   MONOABS 0.6 11/22/2018 1520   EOSABS 0.0 09/29/2019 1420   BASOSABS 0.0 09/29/2019 1420    ASSESSMENT AND PLAN: 1. Essential hypertension Not at goal.  Continue lisinopril and carvedilol.  We have added a low-dose of amlodipine.  Advised to check blood pressure at least once a week with goal being 130/80 or lower. - CBC - Comprehensive metabolic panel  2. Class 1 obesity due to excess calories with serious comorbidity and body mass index (BMI) of 30.0 to 30.9 in adult Commended him on weight  loss.  Encouraged him to keep up the good work with healthy eating habits and regular exercise.  3. Coronary artery disease involving native coronary artery of native heart without angina pectoris Stable.  Continue aspirin and carvedilol.  Not on statin therapy due to previous history of drug-induced hepatitis. - Lipid panel  4. Need for immunization against influenza - Flu Vaccine QUAD 31moIM (Fluarix, Fluzone & Alfiuria Quad PF)  5. Prostate cancer screening - PSA  6. Screening for colon cancer Encourage patient to use and turn in the Cologuard test.  7. Nasal congestion - fluticasone (FLONASE) 50 MCG/ACT nasal spray; Place 1 spray into both nostrils daily as needed for allergies or rhinitis.  Dispense: 16 g; Refill: 0    Patient was given the opportunity to ask questions.  Patient verbalized understanding of the plan and was able to repeat key elements of the plan.   Orders Placed This Encounter  Procedures   Flu Vaccine QUAD 67moM (Fluarix, Fluzone & Alfiuria Quad PF)     Requested Prescriptions    No prescriptions requested or ordered in this encounter    No follow-ups on file.  DeKarle PlumberMD, FACP

## 2020-12-28 NOTE — Patient Instructions (Signed)
Please complete and mail your Cologuard test for colon cancer screening.  Your blood pressure is not at goal.  We have added a low-dose of another blood pressure medication called amlodipine to take once a day.

## 2020-12-29 LAB — CBC
Hematocrit: 51.2 % — ABNORMAL HIGH (ref 37.5–51.0)
Hemoglobin: 17.4 g/dL (ref 13.0–17.7)
MCH: 31.9 pg (ref 26.6–33.0)
MCHC: 34 g/dL (ref 31.5–35.7)
MCV: 94 fL (ref 79–97)
Platelets: 184 10*3/uL (ref 150–450)
RBC: 5.45 x10E6/uL (ref 4.14–5.80)
RDW: 13.3 % (ref 11.6–15.4)
WBC: 10.4 10*3/uL (ref 3.4–10.8)

## 2020-12-29 LAB — COMPREHENSIVE METABOLIC PANEL
ALT: 45 IU/L — ABNORMAL HIGH (ref 0–44)
AST: 22 IU/L (ref 0–40)
Albumin/Globulin Ratio: 2 (ref 1.2–2.2)
Albumin: 4.9 g/dL — ABNORMAL HIGH (ref 3.8–4.8)
Alkaline Phosphatase: 113 IU/L (ref 44–121)
BUN/Creatinine Ratio: 17 (ref 10–24)
BUN: 15 mg/dL (ref 8–27)
Bilirubin Total: 0.8 mg/dL (ref 0.0–1.2)
CO2: 24 mmol/L (ref 20–29)
Calcium: 10.3 mg/dL — ABNORMAL HIGH (ref 8.6–10.2)
Chloride: 101 mmol/L (ref 96–106)
Creatinine, Ser: 0.88 mg/dL (ref 0.76–1.27)
Globulin, Total: 2.5 g/dL (ref 1.5–4.5)
Glucose: 131 mg/dL — ABNORMAL HIGH (ref 65–99)
Potassium: 4.5 mmol/L (ref 3.5–5.2)
Sodium: 143 mmol/L (ref 134–144)
Total Protein: 7.4 g/dL (ref 6.0–8.5)
eGFR: 97 mL/min/{1.73_m2} (ref >59)

## 2020-12-29 LAB — LIPID PANEL
Chol/HDL Ratio: 3.8 ratio (ref 0.0–5.0)
Cholesterol, Total: 168 mg/dL (ref 100–199)
HDL: 44 mg/dL (ref >39)
LDL Chol Calc (NIH): 99 mg/dL (ref 0–99)
Triglycerides: 141 mg/dL (ref 0–149)
VLDL Cholesterol Cal: 25 mg/dL (ref 5–40)

## 2020-12-29 LAB — PSA: Prostate Specific Ag, Serum: 1.4 ng/mL (ref 0.0–4.0)

## 2020-12-31 ENCOUNTER — Telehealth: Payer: Self-pay

## 2020-12-31 NOTE — Telephone Encounter (Signed)
Contacted pt to go over lab results pt didn't answer lvm asking pt to give me a call at her/his earliest convenience   Sent a CRM and forward labs to NT to give pt labs when they call back

## 2021-01-08 ENCOUNTER — Other Ambulatory Visit: Payer: Self-pay | Admitting: Internal Medicine

## 2021-01-08 DIAGNOSIS — I10 Essential (primary) hypertension: Secondary | ICD-10-CM

## 2021-01-10 ENCOUNTER — Other Ambulatory Visit: Payer: Self-pay | Admitting: Internal Medicine

## 2021-01-10 DIAGNOSIS — I10 Essential (primary) hypertension: Secondary | ICD-10-CM

## 2021-01-19 ENCOUNTER — Other Ambulatory Visit: Payer: Self-pay | Admitting: Internal Medicine

## 2021-01-19 DIAGNOSIS — R0981 Nasal congestion: Secondary | ICD-10-CM

## 2021-01-19 NOTE — Telephone Encounter (Signed)
Requested Prescriptions  Pending Prescriptions Disp Refills  . fluticasone (FLONASE) 50 MCG/ACT nasal spray [Pharmacy Med Name: FLUTICASONE PROP 50 MCG SPRAY] 16 mL 0    Sig: PLACE 1 SPRAY INTO BOTH NOSTRILS DAILY AS NEEDED FOR ALLERGIES OR RHINITIS.     Ear, Nose, and Throat: Nasal Preparations - Corticosteroids Passed - 01/19/2021  2:30 PM      Passed - Valid encounter within last 12 months    Recent Outpatient Visits          3 weeks ago Essential hypertension   [place], MD   4 months ago Essential hypertension   [place], MD   8 months ago Essential hypertension   [place], Deborah B, MD   1 year ago Need for zoster vaccination   [place], Jarome Matin, RPH-CPP   1 year ago Essential hypertension   [place], MD      Future Appointments            In 3 months Wynetta Emery Dalbert Batman, MD [place]   In 4 months Erlinda Hong, Marylynn Pearson, MD [facility]

## 2021-02-15 ENCOUNTER — Other Ambulatory Visit: Payer: Self-pay | Admitting: Internal Medicine

## 2021-02-15 DIAGNOSIS — R0981 Nasal congestion: Secondary | ICD-10-CM

## 2021-03-11 ENCOUNTER — Other Ambulatory Visit: Payer: Self-pay | Admitting: Internal Medicine

## 2021-03-11 DIAGNOSIS — R0981 Nasal congestion: Secondary | ICD-10-CM

## 2021-03-19 ENCOUNTER — Other Ambulatory Visit: Payer: Self-pay | Admitting: Internal Medicine

## 2021-03-19 DIAGNOSIS — I10 Essential (primary) hypertension: Secondary | ICD-10-CM

## 2021-04-08 ENCOUNTER — Other Ambulatory Visit: Payer: Self-pay | Admitting: Internal Medicine

## 2021-04-08 DIAGNOSIS — I10 Essential (primary) hypertension: Secondary | ICD-10-CM

## 2021-04-16 ENCOUNTER — Other Ambulatory Visit: Payer: Self-pay | Admitting: Internal Medicine

## 2021-04-16 DIAGNOSIS — R0981 Nasal congestion: Secondary | ICD-10-CM

## 2021-04-20 ENCOUNTER — Other Ambulatory Visit: Payer: Self-pay | Admitting: Internal Medicine

## 2021-04-20 DIAGNOSIS — I10 Essential (primary) hypertension: Secondary | ICD-10-CM

## 2021-04-20 NOTE — Telephone Encounter (Signed)
last RF 01/08/21 #180 1 RF should have enough to last until March  Requested Prescriptions  Refused Prescriptions Disp Refills   lisinopril (ZESTRIL) 10 MG tablet [Pharmacy Med Name: LISINOPRIL 10 MG TABLET] 180 tablet 1    Sig: TAKE 1 TABLET BY MOUTH TWICE A DAY     Cardiovascular:  ACE Inhibitors Failed - 04/20/2021 12:10 PM      Failed - Last BP in normal range    BP Readings from Last 1 Encounters:  12/28/20 (!) 150/80         Passed - Cr in normal range and within 180 days    Creatinine, Ser  Date Value Ref Range Status  12/28/2020 0.88 0.76 - 1.27 mg/dL Final         Passed - K in normal range and within 180 days    Potassium  Date Value Ref Range Status  12/28/2020 4.5 3.5 - 5.2 mmol/L Final         Passed - Patient is not pregnant      Passed - Valid encounter within last 6 months    Recent Outpatient Visits          3 months ago Essential hypertension   [place], Deborah B, MD   7 months ago Essential hypertension   [place], Deborah B, MD   11 months ago Essential hypertension   [place], Deborah B, MD   1 year ago Need for zoster vaccination   [place], Jarome Matin, RPH-CPP   1 year ago Essential hypertension   [place], MD      Future Appointments            In 1 week Ladell Pier, MD [place]   In 1 month Erlinda Hong, Marylynn Pearson, MD [facility]

## 2021-04-30 ENCOUNTER — Other Ambulatory Visit: Payer: Self-pay

## 2021-04-30 ENCOUNTER — Ambulatory Visit: Payer: Medicaid Other | Attending: Internal Medicine | Admitting: Internal Medicine

## 2021-04-30 ENCOUNTER — Encounter: Payer: Self-pay | Admitting: Internal Medicine

## 2021-04-30 VITALS — BP 132/78 | HR 56 | Resp 16 | Wt 234.6 lb

## 2021-04-30 DIAGNOSIS — I10 Essential (primary) hypertension: Secondary | ICD-10-CM | POA: Diagnosis not present

## 2021-04-30 DIAGNOSIS — G4709 Other insomnia: Secondary | ICD-10-CM | POA: Diagnosis not present

## 2021-04-30 DIAGNOSIS — I5032 Chronic diastolic (congestive) heart failure: Secondary | ICD-10-CM | POA: Diagnosis not present

## 2021-04-30 DIAGNOSIS — E669 Obesity, unspecified: Secondary | ICD-10-CM

## 2021-04-30 DIAGNOSIS — Z1211 Encounter for screening for malignant neoplasm of colon: Secondary | ICD-10-CM

## 2021-04-30 DIAGNOSIS — Z23 Encounter for immunization: Secondary | ICD-10-CM | POA: Diagnosis not present

## 2021-04-30 DIAGNOSIS — Z6831 Body mass index (BMI) 31.0-31.9, adult: Secondary | ICD-10-CM | POA: Diagnosis not present

## 2021-04-30 DIAGNOSIS — I77810 Thoracic aortic ectasia: Secondary | ICD-10-CM | POA: Diagnosis not present

## 2021-04-30 DIAGNOSIS — I2510 Atherosclerotic heart disease of native coronary artery without angina pectoris: Secondary | ICD-10-CM | POA: Diagnosis not present

## 2021-04-30 MED ORDER — ATORVASTATIN CALCIUM 10 MG PO TABS: ORAL_TABLET | ORAL | 5 refills | Status: DC

## 2021-04-30 NOTE — Progress Notes (Signed)
Patient ID: Joshua [name], male    DOB: [birth year]  MRN: [id number]  CC: Chronic disease management  Subjective: Joshua [name] is a 61 y.o. male who presents for chronic ds management His concerns today include:  Patient with history of HTN, OA BL hips s/p TKR, prediabetes, CAD [with stent to LAD, RCA], EF 55-60%%, bradycardia , bicuspid aortic valve, mod AS, mild dilated ascending aorta.   Last seen 12/2020  C/o problems sleeping.  The time that he gets in bed varies every night.  Some nights he sleeps in his recliner instead of his bed.  He sleeps with the television on.  Sometimes he takes Tylenol PM but he wakes up in the morning not feeling good.  He does not drink alcoholic beverages excessively at night or caffeinated beverages.  HYPERTENSION/CAD/chronic diastolic heart failure/mild dilation of the ascending aorta/bicuspid aortic valve Currently taking: see medication list Med Adherence: '[x]'  Yes -Lisinopril, Norvasc and Coreg  and ASA Medication side effects: '[]'  Yes    '[x]'  No Adherence with salt restriction: '[x]'  Yes    '[]'  No Home Monitoring?: '[]'  Yes    '[]'  No Monitoring Frequency:  Home BP results range:  SOB? '[]'  Yes    '[x]'  No Chest Pain?: '[]'  Yes    '[x]'  No Leg swelling?: '[]'  Yes    '[x]'  No Headaches?: '[]'  Yes    '[x]'  No Dizziness? '[]'  Yes    '[x]'  No Comments: Overdue for follow-up appointment with his cardiologist Dr. Sallyanne Kuster.  He is wondering about trying statin therapy again.  We had taken him off of it in the past due to elevated AST, ALT and alkaline phosphatase.  He would like to give it a try again at a low dose.  Obesity:  "I'm not keeping my diet as good as I use to."   "Not getting in as much exercise as I should.  I need to get focus more about it" Liked walking but has not done so in a while  HM:  still has cologuard test, states he will turn in. Given PCV 20 today.  Patient Active Problem List   Diagnosis Date Noted   Hepatotoxicity due to statin drug  08/27/2020   COVID-19 vaccine regimen to maintain immunity completed 04/27/2020   Statin intolerance 12/10/2018   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/15/2018   Chronic left shoulder pain 06/08/2018   Status post total hip replacement, right 04/26/2018   Mild dilation of ascending aorta ([place]) 12/26/2017   Dyslipidemia 12/26/2017   Bilateral carotid bruits 12/26/2017   Nocturnal hypoxemia 06/29/2017   Primary osteoarthritis of both hips 06/29/2017   Chronic diastolic heart failure ([place])    Coronary artery disease involving native coronary artery of native heart without angina pectoris 03/31/2017   Ischemic cardiomyopathy 03/31/2017   Marijuana use 03/31/2017   Prediabetes 03/31/2017   Aortic valve insufficiency 12/28/2016   Mitral valve regurgitation 12/28/2016   Obesity 12/28/2016   Aortic valve stenosis, nonrheumatic    Essential hypertension      Current Outpatient Medications on File Prior to Visit  Medication Sig Dispense Refill   amLODipine (NORVASC) 5 MG tablet TAKE 1 TABLET (5 MG TOTAL) BY MOUTH DAILY. 90 tablet 1   aspirin EC 81 MG tablet Take 1 tablet (81 mg total) by mouth daily. 30 tablet 0   carvedilol (COREG) 3.125 MG tablet TAKE 1 TABLET BY MOUTH TWICE A DAY 180 tablet 0   fluticasone (FLONASE) 50 MCG/ACT nasal spray PLACE 1  SPRAY INTO BOTH NOSTRILS DAILY AS NEEDED FOR ALLERGIES OR RHINITIS. 16 mL 1   lisinopril (ZESTRIL) 10 MG tablet TAKE 1 TABLET BY MOUTH TWICE A DAY 180 tablet 1   potassium chloride (KLOR-CON) 10 MEQ tablet TAKE 1 TABLET (10 MEQ TOTAL) BY MOUTH DAILY. DX: HYPOKALEMIA 90 tablet 1   senna-docusate (SENOKOT S) 8.6-50 MG tablet Take 1-2 tablets by mouth at bedtime as needed. (Patient not taking: Reported on 04/30/2021) 30 tablet 1   No current facility-administered medications on file prior to visit.    No Known Allergies  Social History   Socioeconomic History   Marital status: Single    Spouse name: Not on file   Number of children:  Not on file   Years of education: Not on file   Highest education level: Not on file  Occupational History   Not on file  Tobacco Use   Smoking status: Former   Smokeless tobacco: Never   Tobacco comments:    Smoked lightly for approx 20 years, quit 1990s  Vaping Use   Vaping Use: Never used  Substance and Sexual Activity   Alcohol use: Yes    Comment: 1-2 mixed drinks per week   Drug use: Yes    Types: Marijuana    Comment: occasional   Sexual activity: Not on file  Other Topics Concern   Not on file  Social History Narrative   Lives Home alone. Sister makes all health related decisions. Declined Advanced directive at this time   Social Determinants of Health   Financial Resource Strain: Not on file  Food Insecurity: Not on file  Transportation Needs: Not on file  Physical Activity: Not on file  Stress: Not on file  Social Connections: Not on file  Intimate Partner Violence: Not on file    Family History  Problem Relation Age of Onset   CAD Father        MI/CABG age 85    Past Surgical History:  Procedure Laterality Date   CORONARY STENT INTERVENTION N/A 12/29/2016   Procedure: CORONARY STENT INTERVENTION;  Surgeon: Jettie Booze, MD;  Location: [facility];  Service: Cardiovascular;  Laterality: N/A;   RIGHT/LEFT HEART CATH AND CORONARY ANGIOGRAPHY N/A 12/29/2016   Procedure: RIGHT/LEFT HEART CATH AND CORONARY ANGIOGRAPHY;  Surgeon: Jettie Booze, MD;  Location: [facility];  Service: Cardiovascular;  Laterality: N/A;   TONSILLECTOMY     TOTAL HIP ARTHROPLASTY Right 04/26/2018   Procedure: RIGHT TOTAL HIP ARTHROPLASTY ANTERIOR APPROACH;  Surgeon: Leandrew Koyanagi, MD;  Location: [facility];  Service: Orthopedics;  Laterality: Right;   TOTAL HIP ARTHROPLASTY Left 11/15/2018   TOTAL HIP ARTHROPLASTY Left 11/15/2018   Procedure: LEFT TOTAL HIP ARTHROPLASTY ANTERIOR APPROACH;  Surgeon: Leandrew Koyanagi, MD;  Location: [facility];  Service: Orthopedics;   Laterality: Left;    ROS: Review of Systems Negative except as stated above  PHYSICAL EXAM: BP 132/78    Pulse (!) 56    Resp 16    Wt 234 lb 9.6 oz (106.4 kg)    SpO2 97%    BMI 31.82 kg/m   Wt Readings from Last 3 Encounters:  04/30/21 234 lb 9.6 oz (106.4 kg)  12/28/20 232 lb 3.2 oz (105.3 kg)  08/27/20 241 lb 12.8 oz (109.7 kg)    Physical Exam   General appearance - alert, well appearing, and in no distress Mental status - normal mood, behavior, speech, dress, motor activity, and thought processes Neck -  supple, no significant adenopathy.  Positive bilateral carotid bruits Chest - clear to auscultation, no wheezes, rales or rhonchi, symmetric air entry Heart -regular rate and rhythm.  2/6 stock ejection murmur along the left sternal border. Extremities - peripheral pulses normal, no pedal edema, no clubbing or cyanosis Depression screen [facility] 2/9 04/30/2021 12/28/2020 08/27/2020  Decreased Interest 0 0 1  Down, Depressed, Hopeless 0 0 1  PHQ - 2 Score 0 0 2  Altered sleeping - - 2  Tired, decreased energy - - 1  Change in appetite - - 2  Feeling bad or failure about yourself  - - 1  Trouble concentrating - - 1  Moving slowly or fidgety/restless - - 1  Suicidal thoughts - - 0  PHQ-9 Score - - 10    CMP Latest Ref Rng & Units 12/28/2020 09/29/2019 02/24/2019  Glucose 65 - 99 mg/dL 131(H) 115(H) 100(H)  BUN 8 - 27 mg/dL '15 13 17  ' Creatinine 0.76 - 1.27 mg/dL 0.88 0.86 0.86  Sodium 134 - 144 mmol/L 143 137 140  Potassium 3.5 - 5.2 mmol/L 4.5 4.6 4.8  Chloride 96 - 106 mmol/L 101 100 102  CO2 20 - 29 mmol/L '24 22 25  ' Calcium 8.6 - 10.2 mg/dL 10.3(H) 9.4 9.4  Total Protein 6.0 - 8.5 g/dL 7.4 7.2 7.2  Total Bilirubin 0.0 - 1.2 mg/dL 0.8 1.0 0.3  Alkaline Phos 44 - 121 IU/L 113 122(H) 154(H)  AST 0 - 40 IU/L '22 19 18  ' ALT 0 - 44 IU/L 45(H) 30 25   Lipid Panel     Component Value Date/Time   CHOL 168 12/28/2020 0953   TRIG 141 12/28/2020 0953   HDL 44 12/28/2020 0953    CHOLHDL 3.8 12/28/2020 0953   CHOLHDL 4.6 12/27/2016 0238   VLDL 18 12/27/2016 0238   LDLCALC 99 12/28/2020 0953    CBC    Component Value Date/Time   WBC 10.4 12/28/2020 0953   WBC 7.7 11/22/2018 1520   RBC 5.45 12/28/2020 0953   RBC 4.20 (L) 11/22/2018 1520   HGB 17.4 12/28/2020 0953   HCT 51.2 (H) 12/28/2020 0953   PLT 184 12/28/2020 0953   MCV 94 12/28/2020 0953   MCH 31.9 12/28/2020 0953   MCH 31.7 11/22/2018 1520   MCHC 34.0 12/28/2020 0953   MCHC 33.3 11/22/2018 1520   RDW 13.3 12/28/2020 0953   LYMPHSABS 1.8 09/29/2019 1420   MONOABS 0.6 11/22/2018 1520   EOSABS 0.0 09/29/2019 1420   BASOSABS 0.0 09/29/2019 1420    ASSESSMENT AND PLAN:  1. Essential hypertension Close to goal.  Continue current medications and low-salt diet.  2. Coronary artery disease involving native coronary artery of native heart without angina pectoris Stable. Continue carvedilol and aspirin.  Patient wants to be tried with statin therapy again.  I told him that we can put him on the lowest dose of atorvastatin and have him take it 3 days a week for status.  After he has been on the medication for 2 to 3 weeks, he should return to the lab to LFTs checked.  He is agreeable to doing this. - Hepatic Function Panel; Future - atorvastatin (LIPITOR) 10 MG tablet; 1 tab PO Q Mon/Wed/Frid  Dispense: 12 tablet; Refill: 5  3. Other insomnia Good sleep hygiene discussed and encouraged. Patient advised not to drink any caffeinated beverages or excessive alcohol use within several hours of bedtime.  Advised to get in bed around about the same time every  night.  Once in bed, turn off all lights and sounds.  If unable to fall asleep within 30 to 45 minutes of getting in bed, patient should get up and try to do something until he feels sleepy again.  At that time try getting back in bed.   4. Obesity (BMI 30.0-34.9) Patient advised to eliminate sugary drinks from the diet, cut back on portion sizes  especially of white carbohydrates, eat more white lean meat like chicken [place] and seafood instead of beef or pork and incorporate fresh fruits and vegetables into the diet daily. Encouraged him to get in some form of moderate intensity exercise several days a week.  He is set a goal to walk about 15 minutes daily.  5. Mild dilation of ascending aorta (HCC) 6. Chronic diastolic heart failure (HCC) Stable.  We will try to get him back in with his cardiologist in April.  He has a repeat echo scheduled for that time.  7. Need for vaccination against Streptococcus pneumoniae - Pneumococcal conjugate vaccine 20-valent  8. Screening for colon cancer Strongly encouraged him to turn in the Cologuard kit.  He promises to do so.  Patient was given the opportunity to ask questions.  Patient verbalized understanding of the plan and was able to repeat key elements of the plan.   Orders Placed This Encounter  Procedures   Pneumococcal conjugate vaccine 20-valent   Hepatic Function Panel     Requested Prescriptions   Signed Prescriptions Disp Refills   atorvastatin (LIPITOR) 10 MG tablet 12 tablet 5    Sig: 1 tab PO Q Mon/Wed/Frid    Return in about 4 months (around 08/28/2021) for Give lab appt in 2-3 wks .  Karle Plumber, MD, FACP

## 2021-04-30 NOTE — Patient Instructions (Signed)
Start taking atorvastatin 10 mg every Monday Wednesday and Friday.  After you have been on the medication for 2 to 3 weeks, please return to the lab to have your liver function test rechecked.  Insomnia Insomnia is a sleep disorder that makes it difficult to fall asleep or stay asleep. Insomnia can cause fatigue, low energy, difficulty concentrating, mood swings, and poor performance at work or school. There are three different ways to classify insomnia: Difficulty falling asleep. Difficulty staying asleep. Waking up too early in the morning. Any type of insomnia can be long-term (chronic) or short-term (acute). Both are common. Short-term insomnia usually lasts for three months or less. Chronic insomnia occurs at least three times a week for longer than three months. What are the causes? Insomnia may be caused by another condition, situation, or substance, such as: Anxiety. Certain medicines. Gastroesophageal reflux disease (GERD) or other gastrointestinal conditions. Asthma or other breathing conditions. Restless legs syndrome, sleep apnea, or other sleep disorders. Chronic pain. Menopause. Stroke. Abuse of alcohol, tobacco, or illegal drugs. Mental health conditions, such as depression. Caffeine. Neurological disorders, such as Alzheimer's disease. An overactive thyroid (hyperthyroidism). Sometimes, the cause of insomnia may not be known. What increases the risk? Risk factors for insomnia include: Gender. Women are affected more often than men. Age. Insomnia is more common as you get older. Stress. Lack of exercise. Irregular work schedule or working night shifts. Traveling between different time zones. Certain medical and mental health conditions. What are the signs or symptoms? If you have insomnia, the main symptom is having trouble falling asleep or having trouble staying asleep. This may lead to other symptoms, such as: Feeling fatigued or having low energy. Feeling  nervous about going to sleep. Not feeling rested in the morning. Having trouble concentrating. Feeling irritable, anxious, or depressed. How is this diagnosed? This condition may be diagnosed based on: Your symptoms and medical history. Your health care provider may ask about: Your sleep habits. Any medical conditions you have. Your mental health. A physical exam. How is this treated? Treatment for insomnia depends on the cause. Treatment may focus on treating an underlying condition that is causing insomnia. Treatment may also include: Medicines to help you sleep. Counseling or therapy. Lifestyle adjustments to help you sleep better. Follow these instructions at home: Eating and drinking  Limit or avoid alcohol, caffeinated beverages, and cigarettes, especially close to bedtime. These can disrupt your sleep. Do not eat a large meal or eat spicy foods right before bedtime. This can lead to digestive discomfort that can make it hard for you to sleep. Sleep habits  Keep a sleep diary to help you and your health care provider figure out what could be causing your insomnia. Write down: When you sleep. When you wake up during the night. How well you sleep. How rested you feel the next day. Any side effects of medicines you are taking. What you eat and drink. Make your bedroom a dark, comfortable place where it is easy to fall asleep. Put up shades or blackout curtains to block light from outside. Use a white noise machine to block noise. Keep the temperature cool. Limit screen use before bedtime. This includes: Watching TV. Using your smartphone, tablet, or computer. Stick to a routine that includes going to bed and waking up at the same times every day and night. This can help you fall asleep faster. Consider making a quiet activity, such as reading, part of your nighttime routine. Try to avoid taking naps  during the day so that you sleep better at night. Get out of bed if you are  still awake after 15 minutes of trying to sleep. Keep the lights down, but try reading or doing a quiet activity. When you feel sleepy, go back to bed. General instructions Take over-the-counter and prescription medicines only as told by your health care provider. Exercise regularly, as told by your health care provider. Avoid exercise starting several hours before bedtime. Use relaxation techniques to manage stress. Ask your health care provider to suggest some techniques that may work well for you. These may include: Breathing exercises. Routines to release muscle tension. Visualizing peaceful scenes. Make sure that you drive carefully. Avoid driving if you feel very sleepy. Keep all follow-up visits as told by your health care provider. This is important. Contact a health care provider if: You are tired throughout the day. You have trouble in your daily routine due to sleepiness. You continue to have sleep problems, or your sleep problems get worse. Get help right away if: You have serious thoughts about hurting yourself or someone else. If you ever feel like you may hurt yourself or others, or have thoughts about taking your own life, get help right away. You can go to your nearest emergency department or call: Your local emergency services (911 in the [place] A suicide crisis helpline, such as the [place] at 734-619-9454 or 988 in the [place]. This is open 24 hours a day. Summary Insomnia is a sleep disorder that makes it difficult to fall asleep or stay asleep. Insomnia can be long-term (chronic) or short-term (acute). Treatment for insomnia depends on the cause. Treatment may focus on treating an underlying condition that is causing insomnia. Keep a sleep diary to help you and your health care provider figure out what could be causing your insomnia. This information is not intended to replace advice given to you by your health care provider. Make sure you  discuss any questions you have with your health care provider. Document Revised: 10/24/2020 Document Reviewed: 02/09/2020 Elsevier Patient Education  2022 [employer].

## 2021-05-14 ENCOUNTER — Other Ambulatory Visit: Payer: Self-pay

## 2021-05-14 ENCOUNTER — Ambulatory Visit: Payer: Medicaid Other | Attending: Internal Medicine

## 2021-05-14 DIAGNOSIS — I2510 Atherosclerotic heart disease of native coronary artery without angina pectoris: Secondary | ICD-10-CM

## 2021-05-14 DIAGNOSIS — H5213 Myopia, bilateral: Secondary | ICD-10-CM | POA: Diagnosis not present

## 2021-05-15 ENCOUNTER — Other Ambulatory Visit: Payer: Self-pay | Admitting: Internal Medicine

## 2021-05-15 DIAGNOSIS — R0981 Nasal congestion: Secondary | ICD-10-CM

## 2021-05-15 LAB — HEPATIC FUNCTION PANEL
ALT: 23 IU/L (ref 0–44)
AST: 14 IU/L (ref 0–40)
Albumin: 4.4 g/dL (ref 3.8–4.8)
Alkaline Phosphatase: 118 IU/L (ref 44–121)
Bilirubin Total: 0.4 mg/dL (ref 0.0–1.2)
Bilirubin, Direct: 0.14 mg/dL (ref 0.00–0.40)
Total Protein: 6.8 g/dL (ref 6.0–8.5)

## 2021-05-15 NOTE — Progress Notes (Signed)
Let patient know that his liver function test is normal.  Continue atorvastatin.

## 2021-05-15 NOTE — Telephone Encounter (Signed)
Requested medication (s) are due for refill today: has refill remaininng  Requested medication (s) are on the active medication list: yes  Last refill:04/16/21  #16g for 1 RF   Future visit scheduled: 09/10/21  Notes to clinic:  not delegated, has one refill left. Please assess.  Requested Prescriptions  Pending Prescriptions Disp Refills   fluticasone (FLONASE) 50 MCG/ACT nasal spray [Pharmacy Med Name: FLUTICASONE PROP 50 MCG SPRAY] 16 mL 1    Sig: PLACE 1 SPRAY INTO BOTH NOSTRILS DAILY AS NEEDED FOR ALLERGIES OR RHINITIS.     Not Delegated - Ear, Nose, and Throat: Nasal Preparations - Corticosteroids Failed - 05/15/2021 12:30 PM      Failed - This refill cannot be delegated      Passed - Valid encounter within last 12 months    Recent Outpatient Visits           2 weeks ago Essential hypertension   [place], MD   4 months ago Essential hypertension   [place], Deborah B, MD   8 months ago Essential hypertension   [place], Deborah B, MD   1 year ago Essential hypertension   [place], MD   1 year ago Need for zoster vaccination   [place], RPH-CPP       Future Appointments             In 1 week Leandrew Koyanagi, MD [facility]   In 3 months Ladell Pier, MD [place]

## 2021-05-24 ENCOUNTER — Encounter: Payer: Self-pay | Admitting: Orthopaedic Surgery

## 2021-05-24 ENCOUNTER — Ambulatory Visit (INDEPENDENT_AMBULATORY_CARE_PROVIDER_SITE_OTHER): Payer: Medicaid Other | Admitting: Orthopaedic Surgery

## 2021-05-24 ENCOUNTER — Ambulatory Visit (INDEPENDENT_AMBULATORY_CARE_PROVIDER_SITE_OTHER): Payer: Medicaid Other

## 2021-05-24 ENCOUNTER — Other Ambulatory Visit: Payer: Self-pay

## 2021-05-24 DIAGNOSIS — Z96641 Presence of right artificial hip joint: Secondary | ICD-10-CM

## 2021-05-24 DIAGNOSIS — Z96642 Presence of left artificial hip joint: Secondary | ICD-10-CM | POA: Diagnosis not present

## 2021-05-24 NOTE — Progress Notes (Signed)
Post-Op Visit Note   Patient: Joshua [name]           Date of Birth: [birth year]           MRN: [id number] Visit Date: 05/24/2021 PCP: Ladell Pier, MD   Assessment & Plan:  Chief Complaint:  Chief Complaint  Patient presents with   Right Hip - Follow-up   Left Hip - Follow-up   Visit Diagnoses:  1. Status post total replacement of left hip   2. Status post total hip replacement, right     Plan: Joshua [name] is 3 years status post right total hip replacement and 2-1/2 years status post left total hip replacement.  He has done very well and comes in for regular checkup.  He reports no symptoms.  Examination of both hips show fully healed surgical scars with excellent range of motion.  Normal gait and ambulation.  His x-rays reveal well-seated hip replacements without any complications.  At this point we will release Joshua [name] to follow-up as needed.  Dental prophylaxis is no longer needed.  He has my card if he ever needs anything in the future.  Follow-Up Instructions: No follow-ups on file.   Orders:  Orders Placed This Encounter  Procedures   XR Pelvis 1-2 Views   No orders of the defined types were placed in this encounter.   Imaging: XR Pelvis 1-2 Views  Result Date: 05/24/2021 Stable bilateral total hip replacements without complication   PMFS History: Patient Active Problem List   Diagnosis Date Noted   Other insomnia 04/30/2021   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   COVID-19 vaccine regimen to maintain immunity completed 04/27/2020   Statin intolerance 12/10/2018   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/15/2018   Chronic left shoulder pain 06/08/2018   Status post total hip replacement, right 04/26/2018   Mild dilation of ascending aorta ([place]) 12/26/2017   Dyslipidemia 12/26/2017   Bilateral carotid bruits 12/26/2017   Nocturnal hypoxemia 06/29/2017   Primary osteoarthritis of both hips 06/29/2017   Chronic diastolic heart failure  ([place])    Coronary artery disease involving native coronary artery of native heart without angina pectoris 03/31/2017   Ischemic cardiomyopathy 03/31/2017   Marijuana use 03/31/2017   Prediabetes 03/31/2017   Aortic valve insufficiency 12/28/2016   Mitral valve regurgitation 12/28/2016   Obesity 12/28/2016   Aortic valve stenosis, nonrheumatic    Essential hypertension    Past Medical History:  Diagnosis Date   Aortic stenosis    CAD (coronary artery disease), native coronary artery 12/26/2016   DES LAD & RCA, EF 25-30%   CHF (congestive heart failure) (HCC)    Dyslipidemia    Hypertension    Obesity    Osteoarthritis    Pre-diabetes    a. prior A1C 5.6, states he was on meds for this at one point.    Family History  Problem Relation Age of Onset   CAD Father        MI/CABG age 86    Past Surgical History:  Procedure Laterality Date   CORONARY STENT INTERVENTION N/A 12/29/2016   Procedure: CORONARY STENT INTERVENTION;  Surgeon: Jettie Booze, MD;  Location: [place] [facility];  Service: Cardiovascular;  Laterality: N/A;   RIGHT/LEFT HEART CATH AND CORONARY ANGIOGRAPHY N/A 12/29/2016   Procedure: RIGHT/LEFT HEART CATH AND CORONARY ANGIOGRAPHY;  Surgeon: Jettie Booze, MD;  Location: [place] [facility];  Service: Cardiovascular;  Laterality: N/A;   TONSILLECTOMY  TOTAL HIP ARTHROPLASTY Right 04/26/2018   Procedure: RIGHT TOTAL HIP ARTHROPLASTY ANTERIOR APPROACH;  Surgeon: Leandrew Koyanagi, MD;  Location: [place];  Service: Orthopedics;  Laterality: Right;   TOTAL HIP ARTHROPLASTY Left 11/15/2018   TOTAL HIP ARTHROPLASTY Left 11/15/2018   Procedure: LEFT TOTAL HIP ARTHROPLASTY ANTERIOR APPROACH;  Surgeon: Leandrew Koyanagi, MD;  Location: [place];  Service: Orthopedics;  Laterality: Left;   Social History   Occupational History   Not on file  Tobacco Use   Smoking status: Former   Smokeless tobacco: Never   Tobacco comments:    Smoked lightly for approx 20 years, quit  1990s  Vaping Use   Vaping Use: Never used  Substance and Sexual Activity   Alcohol use: Yes    Comment: 1-2 mixed drinks per week   Drug use: Yes    Types: Marijuana    Comment: occasional   Sexual activity: Not on file

## 2021-06-06 NOTE — Telephone Encounter (Signed)
Transmission failure message- needs to be resent- non delegated Rx- forwarded to office

## 2021-06-20 IMAGING — DX CHEST - 2 VIEW
2 series · 2 of 2 positions shown · non-contrast
Comparison: April 23, 2018

CLINICAL DATA: Hypertension.  Recent total hip replacement

EXAM:
CHEST - 2 VIEW

[chest pa]
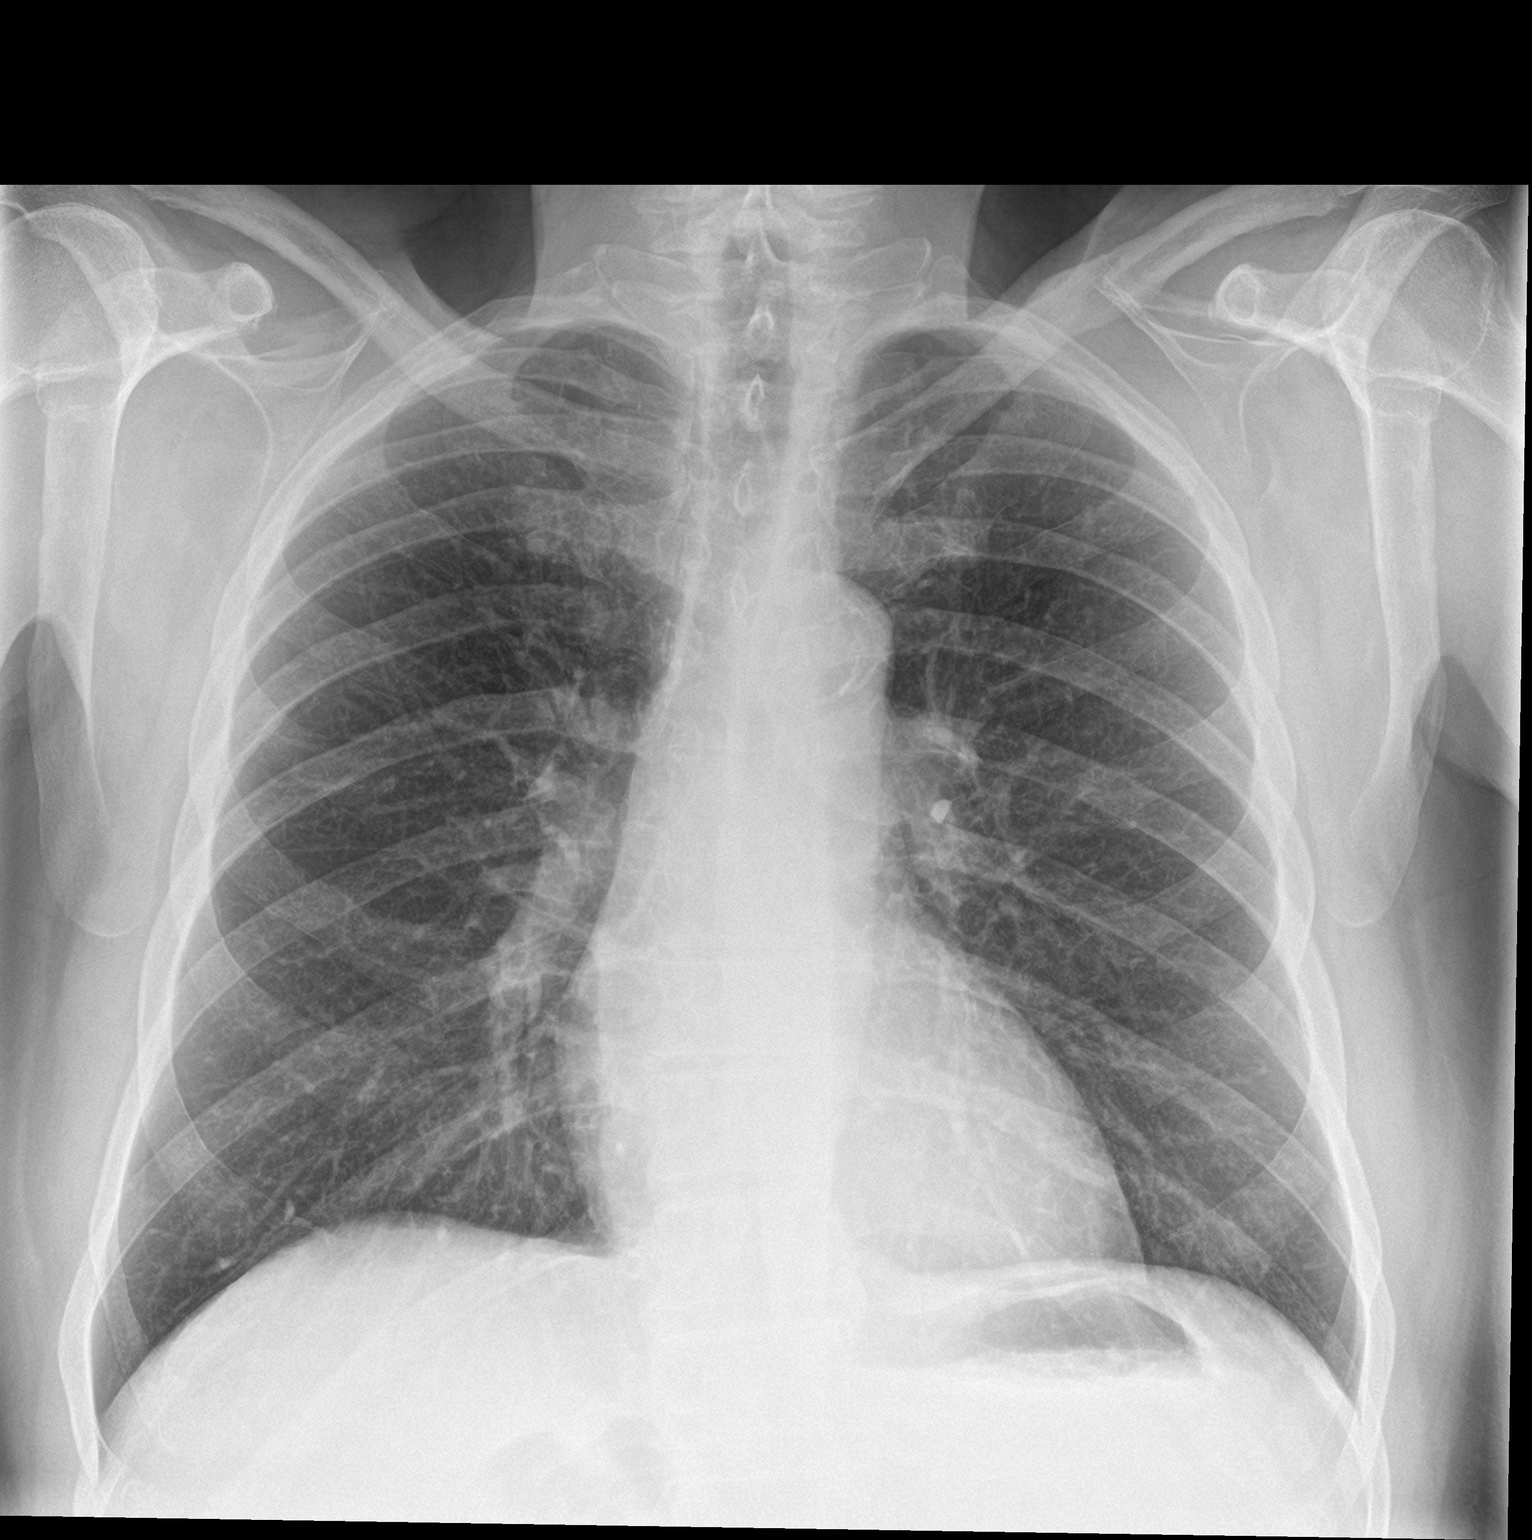

[chest lat]
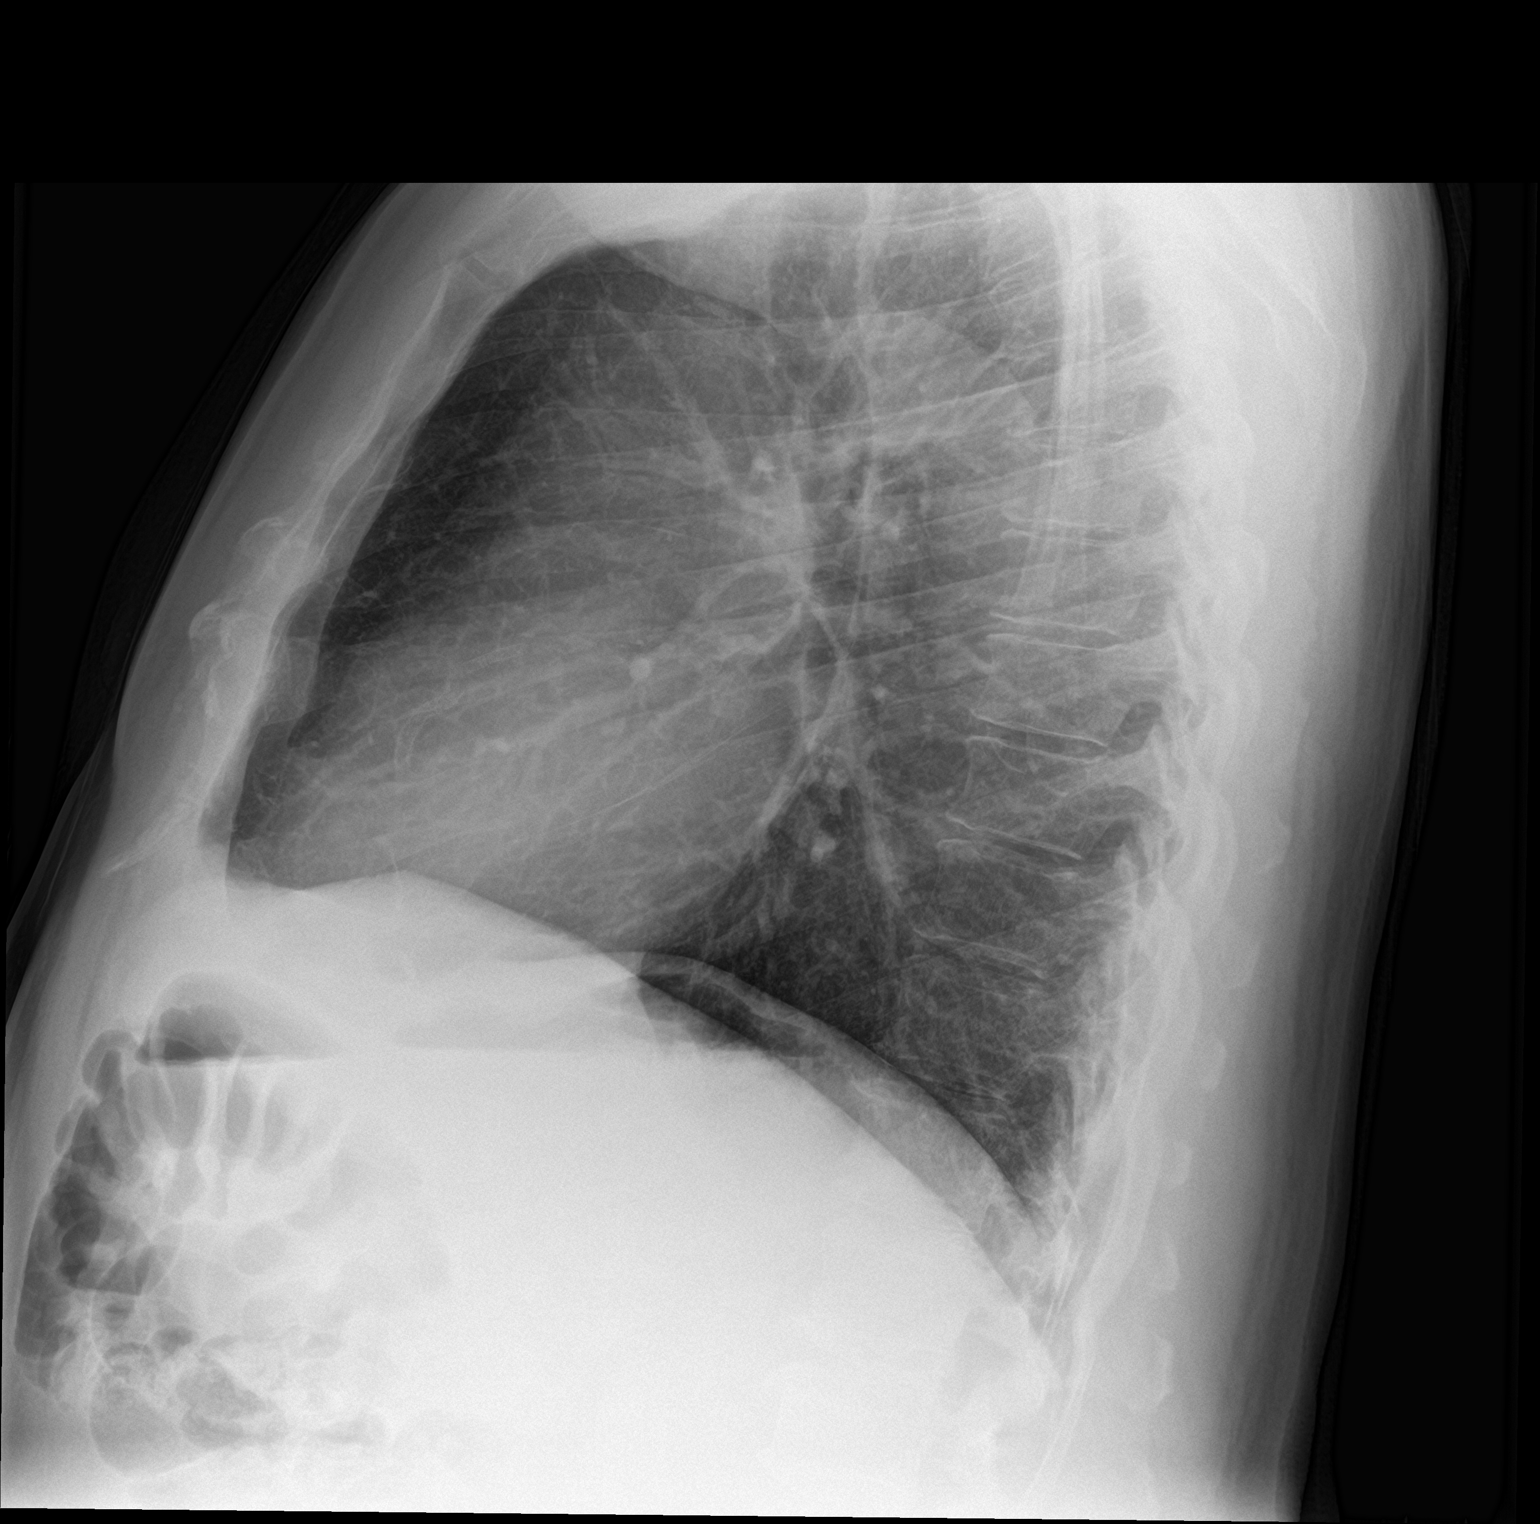

[2 of 2 positions shown; findings below may reference images not displayed]

FINDINGS: Lungs are clear. The heart size and pulmonary vascularity are
normal. No adenopathy. There is aortic atherosclerosis. No bone
lesions.
IMPRESSION: No edema or consolidation. Heart size normal. Aortic Atherosclerosis
([id number]).

## 2021-06-24 ENCOUNTER — Other Ambulatory Visit: Payer: Self-pay | Admitting: Internal Medicine

## 2021-06-24 DIAGNOSIS — I10 Essential (primary) hypertension: Secondary | ICD-10-CM

## 2021-06-25 NOTE — Telephone Encounter (Signed)
Requested Prescriptions  ?Pending Prescriptions Disp Refills  ?? lisinopril (ZESTRIL) 10 MG tablet [Pharmacy Med Name: LISINOPRIL 10 MG TABLET] 180 tablet 1  ?  Sig: TAKE 1 TABLET BY MOUTH TWICE A DAY  ?  ? Cardiovascular:  ACE Inhibitors Passed - 06/24/2021  1:01 PM  ?  ?  Passed - Cr in normal range and within 180 days  ?  Creatinine, Ser  ?Date Value Ref Range Status  ?12/28/2020 0.88 0.76 - 1.27 mg/dL Final  ?   ?  ?  Passed - K in normal range and within 180 days  ?  Potassium  ?Date Value Ref Range Status  ?12/28/2020 4.5 3.5 - 5.2 mmol/L Final  ?   ?  ?  Passed - Patient is not pregnant  ?  ?  Passed - Last BP in normal range  ?  BP Readings from Last 1 Encounters:  ?04/30/21 132/78  ?   ?  ?  Passed - Valid encounter within last 6 months  ?  Recent Outpatient Visits   ?      ? 1 month ago Essential hypertension  ? [place] Ladell Pier, MD  ? 5 months ago Essential hypertension  ? [place] Ladell Pier, MD  ? 10 months ago Essential hypertension  ? [place] Ladell Pier, MD  ? 1 year ago Essential hypertension  ? [place] Ladell Pier, MD  ? 1 year ago Need for zoster vaccination  ? [place], RPH-CPP  ?  ?  ?Future Appointments   ?        ? In 2 months Ladell Pier, MD [place]  ?  ? ?  ?  ?  ?? carvedilol (COREG) 3.125 MG tablet [Pharmacy Med Name: CARVEDILOL 3.125 MG TABLET] 180 tablet 0  ?  Sig: TAKE 1 TABLET BY MOUTH TWICE A DAY  ?  ? Cardiovascular: Beta Blockers 3 Passed - 06/24/2021  1:01 PM  ?  ?  Passed - Cr in normal range and within 360 days  ?  Creatinine, Ser  ?Date Value Ref Range Status  ?12/28/2020 0.88 0.76 - 1.27 mg/dL Final  ?   ?  ?  Passed - AST in normal range and within 360 days  ?  AST  ?Date Value Ref Range Status  ?05/14/2021 14 0 - 40  IU/L Final  ?   ?  ?  Passed - ALT in normal range and within 360 days  ?  ALT  ?Date Value Ref Range Status  ?05/14/2021 23 0 - 44 IU/L Final  ?   ?  ?  Passed - Last BP in normal range  ?  BP Readings from Last 1 Encounters:  ?04/30/21 132/78  ?   ?  ?  Passed - Last Heart Rate in normal range  ?  Pulse Readings from Last 1 Encounters:  ?04/30/21 (!) 56  ?   ?  ?  Passed - Valid encounter within last 6 months  ?  Recent Outpatient Visits   ?      ? 1 month ago Essential hypertension  ? [place] Ladell Pier, MD  ? 5 months ago Essential hypertension  ? [place] Ladell Pier, MD  ?  10 months ago Essential hypertension  ? [place] Ladell Pier, MD  ? 1 year ago Essential hypertension  ? [place] Ladell Pier, MD  ? 1 year ago Need for zoster vaccination  ? [employer], RPH-CPP  ?  ?  ?Future Appointments   ?        ? In 2 months Ladell Pier, MD [place]  ?  ? ?  ?  ?  ?? potassium chloride (KLOR-CON) 10 MEQ tablet [Pharmacy Med Name: POTASSIUM CL ER 10 MEQ TABLET] 90 tablet 1  ?  Sig: TAKE 1 TABLET (10 MEQ TOTAL) BY MOUTH DAILY. DX: HYPOKALEMIA  ?  ? Endocrinology:  Minerals - Potassium Supplementation Passed - 06/24/2021  1:01 PM  ?  ?  Passed - K in normal range and within 360 days  ?  Potassium  ?Date Value Ref Range Status  ?12/28/2020 4.5 3.5 - 5.2 mmol/L Final  ?   ?  ?  Passed - Cr in normal range and within 360 days  ?  Creatinine, Ser  ?Date Value Ref Range Status  ?12/28/2020 0.88 0.76 - 1.27 mg/dL Final  ?   ?  ?  Passed - Valid encounter within last 12 months  ?  Recent Outpatient Visits   ?      ? 1 month ago Essential hypertension  ? [place] Ladell Pier, MD  ? 5 months ago Essential hypertension  ? [place] Ladell Pier, MD  ? 10 months ago Essential hypertension  ? [place] Ladell Pier, MD  ? 1 year ago Essential hypertension  ? [place] Ladell Pier, MD  ? 1 year ago Need for zoster vaccination  ? [place], RPH-CPP  ?  ?  ?Future Appointments   ?        ? In 2 months Ladell Pier, MD [place]  ?  ? ?  ?  ?  ? ?

## 2021-07-16 ENCOUNTER — Other Ambulatory Visit: Payer: Self-pay | Admitting: Internal Medicine

## 2021-07-16 DIAGNOSIS — R0981 Nasal congestion: Secondary | ICD-10-CM

## 2021-08-08 ENCOUNTER — Ambulatory Visit (HOSPITAL_COMMUNITY): Payer: Medicaid Other | Attending: Cardiology

## 2021-08-08 DIAGNOSIS — I350 Nonrheumatic aortic (valve) stenosis: Secondary | ICD-10-CM | POA: Insufficient documentation

## 2021-08-08 LAB — ECHOCARDIOGRAM COMPLETE
AR max vel: 1.17 cm2
AV Area VTI: 1.24 cm2
AV Area mean vel: 1.05 cm2
AV Mean grad: 25 mmHg
AV Peak grad: 46.2 mmHg
Ao pk vel: 3.4 m/s
Area-P 1/2: 3.15 cm2
P 1/2 time: 443 msec
S' Lateral: 3.5 cm

## 2021-08-20 ENCOUNTER — Other Ambulatory Visit: Payer: Self-pay | Admitting: Internal Medicine

## 2021-08-20 DIAGNOSIS — R0981 Nasal congestion: Secondary | ICD-10-CM

## 2021-09-10 ENCOUNTER — Encounter: Payer: Self-pay | Admitting: Internal Medicine

## 2021-09-10 ENCOUNTER — Ambulatory Visit: Payer: Medicaid Other | Attending: Internal Medicine | Admitting: Internal Medicine

## 2021-09-10 VITALS — BP 97/63 | HR 63 | Ht 72.0 in | Wt 233.4 lb

## 2021-09-10 DIAGNOSIS — I10 Essential (primary) hypertension: Secondary | ICD-10-CM

## 2021-09-10 DIAGNOSIS — E669 Obesity, unspecified: Secondary | ICD-10-CM | POA: Diagnosis not present

## 2021-09-10 DIAGNOSIS — G8929 Other chronic pain: Secondary | ICD-10-CM | POA: Diagnosis not present

## 2021-09-10 DIAGNOSIS — M25511 Pain in right shoulder: Secondary | ICD-10-CM | POA: Diagnosis not present

## 2021-09-10 DIAGNOSIS — Z1211 Encounter for screening for malignant neoplasm of colon: Secondary | ICD-10-CM

## 2021-09-10 DIAGNOSIS — I2510 Atherosclerotic heart disease of native coronary artery without angina pectoris: Secondary | ICD-10-CM

## 2021-09-10 MED ORDER — CARVEDILOL 3.125 MG PO TABS: 3.1250 mg | ORAL_TABLET | Freq: Two times a day (BID) | ORAL | 2 refills | Status: DC

## 2021-09-10 NOTE — Progress Notes (Signed)
Patient ID: Joshua [name], male    DOB: Jun 30, 1960  MRN: [id number]  CC: Hypertension   Subjective: Joshua [name] is a 61 y.o. male who presents for chronic ds management His concerns today include:  Patient with history of HTN, OA BL hips s/p TKR, prediabetes, CAD [with stent to LAD, RCA], EF 55-60%%, bradycardia , bicuspid aortic valve, mod AS, mild dilated ascending aorta.   HTN/CAD/bicuspid aortic valve/mod AS:  he is surprised that BP low today. Checks BP about once a mth and readings have been good. Reports compliance with Coreg 3.125 mg BID, lisinopril 10 mg, Norvasc 5 mg and ASA. We restarted on Lipitor on last visit with him him taking 4x awk. We plan to recheck LFTs and lipid today. No CP, SOB, LE edema Has appt with his cardiologist 10/2021   Obesity/PreDM:  staying active working in his yard.  Feels he needs to get in better shape.  Admits that he over eats.  C/oi pain and limited ROM RT shoulder that is chronic but got a little worse x 3 mths.  [place] [address] against his car on RT shoulder 3 mths ago to break fall   HM: pt still has not done his cologuard test as yet.    Patient Active Problem List   Diagnosis Date Noted   Other insomnia 04/30/2021   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   COVID-19 vaccine regimen to maintain immunity completed 04/27/2020   Statin intolerance 12/10/2018   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/15/2018   Chronic left shoulder pain 06/08/2018   Status post total hip replacement, right 04/26/2018   Mild dilation of ascending aorta ([place]) 12/26/2017   Dyslipidemia 12/26/2017   Bilateral carotid bruits 12/26/2017   Nocturnal hypoxemia 06/29/2017   Primary osteoarthritis of both hips 06/29/2017   Chronic diastolic heart failure ([place])    Coronary artery disease involving native coronary artery of native heart without angina pectoris 03/31/2017   Ischemic cardiomyopathy 03/31/2017   Marijuana use 03/31/2017   Prediabetes  03/31/2017   Aortic valve insufficiency 12/28/2016   Mitral valve regurgitation 12/28/2016   Obesity 12/28/2016   Aortic valve stenosis, nonrheumatic    Essential hypertension      Current Outpatient Medications on File Prior to Visit  Medication Sig Dispense Refill   amLODipine (NORVASC) 5 MG tablet TAKE 1 TABLET (5 MG TOTAL) BY MOUTH DAILY. 90 tablet 1   aspirin EC 81 MG tablet Take 1 tablet (81 mg total) by mouth daily. 30 tablet 0   atorvastatin (LIPITOR) 10 MG tablet 1 tab PO Q Mon/Wed/Frid 12 tablet 5   carvedilol (COREG) 3.125 MG tablet TAKE 1 TABLET BY MOUTH TWICE A DAY 180 tablet 0   fluticasone (FLONASE) 50 MCG/ACT nasal spray PLACE 1 SPRAY INTO BOTH NOSTRILS DAILY AS NEEDED FOR ALLERGIES OR RHINITIS. 16 mL 0   lisinopril (ZESTRIL) 10 MG tablet TAKE 1 TABLET BY MOUTH TWICE A DAY 180 tablet 1   potassium chloride (KLOR-CON) 10 MEQ tablet TAKE 1 TABLET (10 MEQ TOTAL) BY MOUTH DAILY. DX: HYPOKALEMIA 90 tablet 1   senna-docusate (SENOKOT S) 8.6-50 MG tablet Take 1-2 tablets by mouth at bedtime as needed. (Patient not taking: Reported on 09/10/2021) 30 tablet 1   No current facility-administered medications on file prior to visit.    No Known Allergies  Social History   Socioeconomic History   Marital status: Single    Spouse name: Not on file   Number of children: Not on  file   Years of education: Not on file   Highest education level: Not on file  Occupational History   Not on file  Tobacco Use   Smoking status: Former   Smokeless tobacco: Never   Tobacco comments:    Smoked lightly for approx 20 years, quit 1990s  Vaping Use   Vaping Use: Never used  Substance and Sexual Activity   Alcohol use: Yes    Comment: 1-2 mixed drinks per week   Drug use: Yes    Types: Marijuana    Comment: occasional   Sexual activity: Not on file  Other Topics Concern   Not on file  Social History Narrative   Lives Home alone. Sister makes all health related decisions. Declined  Advanced directive at this time   Social Determinants of Health   Financial Resource Strain: Not on file  Food Insecurity: Not on file  Transportation Needs: Not on file  Physical Activity: Not on file  Stress: Not on file  Social Connections: Not on file  Intimate Partner Violence: Not on file    Family History  Problem Relation Age of Onset   CAD Father        MI/CABG age 53    Past Surgical History:  Procedure Laterality Date   CORONARY STENT INTERVENTION N/A 12/29/2016   Procedure: CORONARY STENT INTERVENTION;  Surgeon: Jettie Booze, MD;  Location: [facility];  Service: Cardiovascular;  Laterality: N/A;   RIGHT/LEFT HEART CATH AND CORONARY ANGIOGRAPHY N/A 12/29/2016   Procedure: RIGHT/LEFT HEART CATH AND CORONARY ANGIOGRAPHY;  Surgeon: Jettie Booze, MD;  Location: [facility];  Service: Cardiovascular;  Laterality: N/A;   TONSILLECTOMY     TOTAL HIP ARTHROPLASTY Right 04/26/2018   Procedure: RIGHT TOTAL HIP ARTHROPLASTY ANTERIOR APPROACH;  Surgeon: Leandrew Koyanagi, MD;  Location: [facility];  Service: Orthopedics;  Laterality: Right;   TOTAL HIP ARTHROPLASTY Left 11/15/2018   TOTAL HIP ARTHROPLASTY Left 11/15/2018   Procedure: LEFT TOTAL HIP ARTHROPLASTY ANTERIOR APPROACH;  Surgeon: Leandrew Koyanagi, MD;  Location: [facility];  Service: Orthopedics;  Laterality: Left;    ROS: Review of Systems Negative except as stated above  PHYSICAL EXAM: BP 97/63   Pulse 63   Ht 6' (1.829 m)   Wt 233 lb 6.4 oz (105.9 kg)   SpO2 97%   BMI 31.65 kg/m   Wt Readings from Last 3 Encounters:  09/10/21 233 lb 6.4 oz (105.9 kg)  04/30/21 234 lb 9.6 oz (106.4 kg)  12/28/20 232 lb 3.2 oz (105.3 kg)    Physical Exam   General appearance - alert, well appearing, older Caucasian male and in no distress Mental status - normal mood, behavior, speech, dress, motor activity, and thought processes Neck - supple, no significant adenopathy Chest - clear to auscultation, no  wheezes, rales or rhonchi, symmetric air entry Heart - normal rate, regular rhythm, normal S1, S2.  2/6 systolic ejection murmur heard along the left sternal border.  Musculoskeletal -right shoulder: No tenderness on palpation along the anterior and posterior joint lines.  Mild to moderate discomfort with attempted passive range of motion in all directions.  Drop arm test positive. Extremities - peripheral pulses normal, no pedal edema, no clubbing or cyanosis     Latest Ref Rng & Units 05/14/2021   11:41 AM 12/28/2020    9:53 AM 09/29/2019    2:20 PM  CMP  Glucose 65 - 99 mg/dL  131   115  BUN 8 - 27 mg/dL  15   13    Creatinine 0.76 - 1.27 mg/dL  0.88   0.86    Sodium 134 - 144 mmol/L  143   137    Potassium 3.5 - 5.2 mmol/L  4.5   4.6    Chloride 96 - 106 mmol/L  101   100    CO2 20 - 29 mmol/L  24   22    Calcium 8.6 - 10.2 mg/dL  10.3   9.4    Total Protein 6.0 - 8.5 g/dL 6.8   7.4   7.2    Total Bilirubin 0.0 - 1.2 mg/dL 0.4   0.8   1.0    Alkaline Phos 44 - 121 IU/L 118   113   122    AST 0 - 40 IU/L '14   22   19    '$ ALT 0 - 44 IU/L 23   45   30     Lipid Panel     Component Value Date/Time   CHOL 168 12/28/2020 0953   TRIG 141 12/28/2020 0953   HDL 44 12/28/2020 0953   CHOLHDL 3.8 12/28/2020 0953   CHOLHDL 4.6 12/27/2016 0238   VLDL 18 12/27/2016 0238   LDLCALC 99 12/28/2020 0953    CBC    Component Value Date/Time   WBC 10.4 12/28/2020 0953   WBC 7.7 11/22/2018 1520   RBC 5.45 12/28/2020 0953   RBC 4.20 (L) 11/22/2018 1520   HGB 17.4 12/28/2020 0953   HCT 51.2 (H) 12/28/2020 0953   PLT 184 12/28/2020 0953   MCV 94 12/28/2020 0953   MCH 31.9 12/28/2020 0953   MCH 31.7 11/22/2018 1520   MCHC 34.0 12/28/2020 0953   MCHC 33.3 11/22/2018 1520   RDW 13.3 12/28/2020 0953   LYMPHSABS 1.8 09/29/2019 1420   MONOABS 0.6 11/22/2018 1520   EOSABS 0.0 09/29/2019 1420   BASOSABS 0.0 09/29/2019 1420    ASSESSMENT AND PLAN:  1. Essential hypertension At goal.   Continue carvedilol 3.125 mg twice a day, lisinopril 10 mg daily, Norvasc 5 mg daily.  2. Coronary artery disease involving native coronary artery of native heart without angina pectoris Stable.  Continue carvedilol, aspirin and Lipitor.  We plan to check LFTs today. - Hepatic function panel - Lipid panel  3. Obesity (BMI 30.0-34.9) Encourage healthy eating habits including cutting back on his portion sizes.  Encouraged him to move more with goal of getting in about 30 minutes 5 days a week of moderate intensity exercise.  Let us know if he develops any chest pains with exercise. - Hemoglobin A1c  4. Chronic right shoulder pain Likely rotator cuff injury. - Ambulatory referral to Orthopedic Surgery  5. Screening for colon cancer Discussed the importance of colon cancer screening.  Encouraged him to use the Cologuard test and mail it in.  He declines colonoscopy.    Patient was given the opportunity to ask questions.  Patient verbalized understanding of the plan and was able to repeat key elements of the plan.   This documentation was completed using Radio producer.  Any transcriptional errors are unintentional.  No orders of the defined types were placed in this encounter.    Requested Prescriptions    No prescriptions requested or ordered in this encounter    No follow-ups on file.  Karle Plumber, MD, FACP

## 2021-09-10 NOTE — Progress Notes (Signed)
Right shoulder pain

## 2021-09-11 ENCOUNTER — Telehealth: Payer: Self-pay | Admitting: Internal Medicine

## 2021-09-11 DIAGNOSIS — R7989 Other specified abnormal findings of blood chemistry: Secondary | ICD-10-CM

## 2021-09-11 LAB — LIPID PANEL
Chol/HDL Ratio: 3.4 ratio (ref 0.0–5.0)
Cholesterol, Total: 114 mg/dL (ref 100–199)
HDL: 34 mg/dL — ABNORMAL LOW (ref >39)
LDL Chol Calc (NIH): 52 mg/dL (ref 0–99)
Triglycerides: 167 mg/dL — ABNORMAL HIGH (ref 0–149)
VLDL Cholesterol Cal: 28 mg/dL (ref 5–40)

## 2021-09-11 LAB — HEPATIC FUNCTION PANEL
ALT: 58 IU/L — ABNORMAL HIGH (ref 0–44)
AST: 21 IU/L (ref 0–40)
Albumin: 4.4 g/dL (ref 3.8–4.8)
Alkaline Phosphatase: 142 IU/L — ABNORMAL HIGH (ref 44–121)
Bilirubin Total: 0.4 mg/dL (ref 0.0–1.2)
Bilirubin, Direct: 0.13 mg/dL (ref 0.00–0.40)
Total Protein: 6.9 g/dL (ref 6.0–8.5)

## 2021-09-11 LAB — HEMOGLOBIN A1C
Est. average glucose Bld gHb Est-mCnc: 128 mg/dL
Hgb A1c MFr Bld: 6.1 % — ABNORMAL HIGH (ref 4.8–5.6)

## 2021-09-11 NOTE — Telephone Encounter (Signed)
Phone call placed to patient today to go over lab results.  Patient answered the phone but stated that he could not hear me and hung up.  I called his cell phone back and left a voicemail message informing of who I am and the reason that I was calling.  Informed that I will send the results of his lab test to his MyChart account.  Encouraged him to please take a look.

## 2021-09-18 ENCOUNTER — Ambulatory Visit: Payer: Medicaid Other | Admitting: Orthopaedic Surgery

## 2021-10-01 ENCOUNTER — Ambulatory Visit (INDEPENDENT_AMBULATORY_CARE_PROVIDER_SITE_OTHER): Payer: Medicaid Other

## 2021-10-01 ENCOUNTER — Ambulatory Visit (INDEPENDENT_AMBULATORY_CARE_PROVIDER_SITE_OTHER): Payer: Medicaid Other | Admitting: Orthopaedic Surgery

## 2021-10-01 DIAGNOSIS — M25511 Pain in right shoulder: Secondary | ICD-10-CM | POA: Diagnosis not present

## 2021-10-01 MED ORDER — HYDROCODONE-ACETAMINOPHEN 5-325 MG PO TABS: 1.0000 | ORAL_TABLET | Freq: Two times a day (BID) | ORAL | 0 refills | Status: DC | PRN

## 2021-10-01 MED ORDER — METHOCARBAMOL 500 MG PO TABS: 500.0000 mg | ORAL_TABLET | Freq: Two times a day (BID) | ORAL | 2 refills | Status: DC | PRN

## 2021-10-01 NOTE — Progress Notes (Signed)
Office Visit Note   Patient: Joshua [name]           Date of Birth: [birth year]           MRN: [id number] Visit Date: 10/01/2021              Requested by: Ladell Pier, MD [place] [place],  [place] [id number] PCP: Ladell Pier, MD   Assessment & Plan: Visit Diagnoses:  1. Right shoulder pain, unspecified chronicity     Plan: Impression is right shoulder pain concerning for rotator cuff tear.  At this point, due to the patient's lack of range of motion and weakness I would like to get an MRI to assess for rotator cuff pathology.  We will follow-up with [place] once completed.  In the meantime, have agreed to call in prescription of Norco and Robaxin. Follow-Up Instructions: Return for after MRI.   Orders:  Orders Placed This Encounter  Procedures   XR Shoulder Right   Meds ordered this encounter  Medications   HYDROcodone-acetaminophen (NORCO) 5-325 MG tablet    Sig: Take 1 tablet by mouth 2 (two) times daily as needed.    Dispense:  20 tablet    Refill:  0   methocarbamol (ROBAXIN) 500 MG tablet    Sig: Take 1 tablet (500 mg total) by mouth 2 (two) times daily as needed for muscle spasms.    Dispense:  20 tablet    Refill:  2      Procedures: No procedures performed   Clinical Data: No additional findings.   Subjective: Chief Complaint  Patient presents with   Right Shoulder - Pain    HPI patient is a pleasant 61 year old gentleman who comes in today with right shoulder pain.  He notes back in February he slipped causing his right shoulder to abduct and forward flexion.  His pain was bad at that point but worsened about 2 months later when he fell off his couch.  He also notes he has been taking care of his ill dad since February.  Pain he has is primarily to the top of the shoulder and into the deltoid.  Pain is worse with forward flexion and abduction.  He also has pain at night trying to sleep on that side.  He has associated  weakness.  He has been taking Tylenol and using topical patches without significant relief.  Review of Systems as detailed in HPI.  All others reviewed and are negative.   Objective: Vital Signs: There were no vitals taken for this visit.  Physical Exam well-developed well-nourished gentleman in no acute distress.  Alert and oriented x3.  Ortho Exam right shoulder exam reveals active forward flexion to about 90 degrees.  I can passively get him to about 120 degrees.  He does have a positive drop arm.  Internal rotation to his back pocket.  Pain and 3 out of 5 strength with empty can testing.  4-5 strength with resisted external rotation.  He is neurovascular intact distally.  Specialty Comments:  No specialty comments available.  Imaging: XR Shoulder Right  Result Date: 10/01/2021 Mild degenerative changes of the before meals and glenohumeral joints    PMFS History: Patient Active Problem List   Diagnosis Date Noted   Other insomnia 04/30/2021   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   COVID-19 vaccine regimen to maintain immunity completed 04/27/2020   Statin intolerance 12/10/2018   Status post total replacement of left hip  11/15/2018   Chronic left shoulder pain 06/08/2018   Status post total hip replacement, right 04/26/2018   Mild dilation of ascending aorta ([place]) 12/26/2017   Dyslipidemia 12/26/2017   Bilateral carotid bruits 12/26/2017   Nocturnal hypoxemia 06/29/2017   Primary osteoarthritis of both hips 06/29/2017   Chronic diastolic heart failure ([facility])    Coronary artery disease involving native coronary artery of native heart without angina pectoris 03/31/2017   Ischemic cardiomyopathy 03/31/2017   Marijuana use 03/31/2017   Prediabetes 03/31/2017   Aortic valve insufficiency 12/28/2016   Mitral valve regurgitation 12/28/2016   Obesity 12/28/2016   Aortic valve stenosis, nonrheumatic    Essential hypertension    Past Medical History:  Diagnosis Date    Aortic stenosis    CAD (coronary artery disease), native coronary artery 12/26/2016   DES LAD & RCA, EF 25-30%   CHF (congestive heart failure) (HCC)    Dyslipidemia    Hypertension    Obesity    Osteoarthritis    Pre-diabetes    a. prior A1C 5.6, states he was on meds for this at one point.    Family History  Problem Relation Age of Onset   CAD Father        MI/CABG age 56    Past Surgical History:  Procedure Laterality Date   CORONARY STENT INTERVENTION N/A 12/29/2016   Procedure: CORONARY STENT INTERVENTION;  Surgeon: Jettie Booze, MD;  Location: [facility];  Service: Cardiovascular;  Laterality: N/A;   RIGHT/LEFT HEART CATH AND CORONARY ANGIOGRAPHY N/A 12/29/2016   Procedure: RIGHT/LEFT HEART CATH AND CORONARY ANGIOGRAPHY;  Surgeon: Jettie Booze, MD;  Location: [facility];  Service: Cardiovascular;  Laterality: N/A;   TONSILLECTOMY     TOTAL HIP ARTHROPLASTY Right 04/26/2018   Procedure: RIGHT TOTAL HIP ARTHROPLASTY ANTERIOR APPROACH;  Surgeon: Leandrew Koyanagi, MD;  Location: [facility];  Service: Orthopedics;  Laterality: Right;   TOTAL HIP ARTHROPLASTY Left 11/15/2018   TOTAL HIP ARTHROPLASTY Left 11/15/2018   Procedure: LEFT TOTAL HIP ARTHROPLASTY ANTERIOR APPROACH;  Surgeon: Leandrew Koyanagi, MD;  Location: [facility];  Service: Orthopedics;  Laterality: Left;   Social History   Occupational History   Not on file  Tobacco Use   Smoking status: Former   Smokeless tobacco: Never   Tobacco comments:    Smoked lightly for approx 20 years, quit 1990s  Vaping Use   Vaping Use: Never used  Substance and Sexual Activity   Alcohol use: Yes    Comment: 1-2 mixed drinks per week   Drug use: Yes    Types: Marijuana    Comment: occasional   Sexual activity: Not on file

## 2021-10-11 ENCOUNTER — Ambulatory Visit
Admission: RE | Admit: 2021-10-11 | Discharge: 2021-10-11 | Disposition: A | Discharge status: Home / Self Care | Payer: Medicaid Other | Source: Ambulatory Visit | Attending: Orthopaedic Surgery | Admitting: Orthopaedic Surgery

## 2021-10-11 DIAGNOSIS — M25511 Pain in right shoulder: Secondary | ICD-10-CM

## 2021-10-16 ENCOUNTER — Encounter: Payer: Self-pay | Admitting: Orthopaedic Surgery

## 2021-10-16 ENCOUNTER — Ambulatory Visit (INDEPENDENT_AMBULATORY_CARE_PROVIDER_SITE_OTHER): Payer: Medicaid Other | Admitting: Orthopaedic Surgery

## 2021-10-16 DIAGNOSIS — M75101 Unspecified rotator cuff tear or rupture of right shoulder, not specified as traumatic: Secondary | ICD-10-CM | POA: Diagnosis not present

## 2021-10-16 NOTE — Progress Notes (Signed)
Office Visit Note   Patient: Joshua [name]           Date of Birth: [birth year]           MRN: [id number] Visit Date: 10/16/2021              Requested by: Ladell Pier, MD [place],  [place] [id number] PCP: Ladell Pier, MD   Assessment & Plan: Visit Diagnoses:  1. Rotator cuff syndrome of right shoulder     Plan: MRI shows complete tears of the supraspinatus subscapularis and infraspinatus.  There are some mild glenohumeral OA.  Based on these findings we will make a referral to Dr. Sammuel Hines for further evaluation and treatment.  Follow-Up Instructions: No follow-ups on file.   Orders:  No orders of the defined types were placed in this encounter.  No orders of the defined types were placed in this encounter.     Procedures: No procedures performed   Clinical Data: No additional findings.   Subjective: Chief Complaint  Patient presents with   Right Shoulder - Follow-up    MRI review    HPI Joshua [name] returns today to discuss right shoulder MRI scan.  Now reports no improvement in his shoulder function or pain.  Review of Systems  Constitutional: Negative.   All other systems reviewed and are negative.    Objective: Vital Signs: There were no vitals taken for this visit.  Physical Exam Vitals and nursing note reviewed.  Constitutional:      Appearance: He is well-developed.  Pulmonary:     Effort: Pulmonary effort is normal.  Abdominal:     Palpations: Abdomen is soft.  Skin:    General: Skin is warm.  Neurological:     Mental Status: He is alert and oriented to person, place, and time.  Psychiatric:        Behavior: Behavior normal.        Thought Content: Thought content normal.        Judgment: Judgment normal.     Ortho Exam Examination of right shoulder is unchanged. Specialty Comments:  No specialty comments available.  Imaging: No results found.   PMFS History: Patient Active Problem List    Diagnosis Date Noted   Rotator cuff syndrome of right shoulder 10/16/2021   Other insomnia 04/30/2021   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   COVID-19 vaccine regimen to maintain immunity completed 04/27/2020   Statin intolerance 12/10/2018   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/15/2018   Chronic left shoulder pain 06/08/2018   Status post total hip replacement, right 04/26/2018   Mild dilation of ascending aorta ([place]) 12/26/2017   Dyslipidemia 12/26/2017   Bilateral carotid bruits 12/26/2017   Nocturnal hypoxemia 06/29/2017   Primary osteoarthritis of both hips 06/29/2017   Chronic diastolic heart failure ([place])    Coronary artery disease involving native coronary artery of native heart without angina pectoris 03/31/2017   Ischemic cardiomyopathy 03/31/2017   Marijuana use 03/31/2017   Prediabetes 03/31/2017   Aortic valve insufficiency 12/28/2016   Mitral valve regurgitation 12/28/2016   Obesity 12/28/2016   Aortic valve stenosis, nonrheumatic    Essential hypertension    Past Medical History:  Diagnosis Date   Aortic stenosis    CAD (coronary artery disease), native coronary artery 12/26/2016   DES LAD & RCA, EF 25-30%   CHF (congestive heart failure) (HCC)    Dyslipidemia    Hypertension  Obesity    Osteoarthritis    Pre-diabetes    a. prior A1C 5.6, states he was on meds for this at one point.    Family History  Problem Relation Age of Onset   CAD Father        MI/CABG age 48    Past Surgical History:  Procedure Laterality Date   CORONARY STENT INTERVENTION N/A 12/29/2016   Procedure: CORONARY STENT INTERVENTION;  Surgeon: Jettie Booze, MD;  Location: [facility];  Service: Cardiovascular;  Laterality: N/A;   RIGHT/LEFT HEART CATH AND CORONARY ANGIOGRAPHY N/A 12/29/2016   Procedure: RIGHT/LEFT HEART CATH AND CORONARY ANGIOGRAPHY;  Surgeon: Jettie Booze, MD;  Location: [facility];  Service: Cardiovascular;  Laterality:  N/A;   TONSILLECTOMY     TOTAL HIP ARTHROPLASTY Right 04/26/2018   Procedure: RIGHT TOTAL HIP ARTHROPLASTY ANTERIOR APPROACH;  Surgeon: Leandrew Koyanagi, MD;  Location: [facility];  Service: Orthopedics;  Laterality: Right;   TOTAL HIP ARTHROPLASTY Left 11/15/2018   TOTAL HIP ARTHROPLASTY Left 11/15/2018   Procedure: LEFT TOTAL HIP ARTHROPLASTY ANTERIOR APPROACH;  Surgeon: Leandrew Koyanagi, MD;  Location: [facility];  Service: Orthopedics;  Laterality: Left;   Social History   Occupational History   Not on file  Tobacco Use   Smoking status: Former   Smokeless tobacco: Never   Tobacco comments:    Smoked lightly for approx 20 years, quit 1990s  Vaping Use   Vaping Use: Never used  Substance and Sexual Activity   Alcohol use: Yes    Comment: 1-2 mixed drinks per week   Drug use: Yes    Types: Marijuana    Comment: occasional   Sexual activity: Not on file

## 2021-10-16 NOTE — Addendum Note (Signed)
Addended by: Lendon Collar on: 10/16/2021 01:43 PM   Modules accepted: Orders

## 2021-10-17 ENCOUNTER — Ambulatory Visit (INDEPENDENT_AMBULATORY_CARE_PROVIDER_SITE_OTHER): Payer: Medicaid Other | Admitting: Orthopaedic Surgery

## 2021-10-17 ENCOUNTER — Ambulatory Visit (HOSPITAL_BASED_OUTPATIENT_CLINIC_OR_DEPARTMENT_OTHER): Payer: Self-pay | Admitting: Orthopaedic Surgery

## 2021-10-17 DIAGNOSIS — M75101 Unspecified rotator cuff tear or rupture of right shoulder, not specified as traumatic: Secondary | ICD-10-CM

## 2021-10-17 MED ORDER — ASPIRIN 325 MG PO TBEC: 325.0000 mg | DELAYED_RELEASE_TABLET | Freq: Every day | ORAL | 0 refills | Status: DC

## 2021-10-17 MED ORDER — ACETAMINOPHEN 500 MG PO TABS: 500.0000 mg | ORAL_TABLET | Freq: Three times a day (TID) | ORAL | 0 refills | Status: AC

## 2021-10-17 MED ORDER — IBUPROFEN 800 MG PO TABS: 800.0000 mg | ORAL_TABLET | Freq: Three times a day (TID) | ORAL | 0 refills | Status: AC

## 2021-10-17 MED ORDER — OXYCODONE HCL 5 MG PO CAPS: 5.0000 mg | ORAL_CAPSULE | ORAL | 0 refills | Status: DC | PRN

## 2021-10-17 NOTE — Progress Notes (Signed)
p 

## 2021-10-17 NOTE — Progress Notes (Signed)
Chief Complaint: Right shoulder pain     History of Present Illness:    Joshua [name] is a 61 y.o. male right-hand-dominant male presents with right shoulder pain and limited overhead activity after he had a fall in February this year.  He presents today as a referral from my partner Dr. He states that since that time he was attempting to work on a series of band exercises for strengthening of the shoulder although unfortunately this has not improved his overhead motion or strength.  Approximately 2 and half weeks ago he was picking up a heavy item and felt another pop in the shoulder.  Since that time he has had extremely significant pain while at rest and at sleep.  He is not able to lay on the side.  He has been taking muscle relaxers for pain.  He is currently disabled as a result of his congestive heart failure.  He was treated with cardiac stenting with restoration of his baseline cardiac function.  Does see a cardiologist routinely.  He is overall active and does still do significant work in his yard and around his house.  That being said he is quite limited and he is not able to generate essentially any strength overhead.  He is here today for further assessment.  He denies any feeling that the shoulder dislocated during the initial injury.    Surgical History:   None  PMH/PSH/Family History/Social History/Meds/Allergies:    Past Medical History:  Diagnosis Date   Aortic stenosis    CAD (coronary artery disease), native coronary artery 12/26/2016   DES LAD & RCA, EF 25-30%   CHF (congestive heart failure) (HCC)    Dyslipidemia    Hypertension    Obesity    Osteoarthritis    Pre-diabetes    a. prior A1C 5.6, states he was on meds for this at one point.   Past Surgical History:  Procedure Laterality Date   CORONARY STENT INTERVENTION N/A 12/29/2016   Procedure: CORONARY STENT INTERVENTION;  Surgeon: Jettie Booze, MD;  Location:  [facility];  Service: Cardiovascular;  Laterality: N/A;   RIGHT/LEFT HEART CATH AND CORONARY ANGIOGRAPHY N/A 12/29/2016   Procedure: RIGHT/LEFT HEART CATH AND CORONARY ANGIOGRAPHY;  Surgeon: Jettie Booze, MD;  Location: [facility];  Service: Cardiovascular;  Laterality: N/A;   TONSILLECTOMY     TOTAL HIP ARTHROPLASTY Right 04/26/2018   Procedure: RIGHT TOTAL HIP ARTHROPLASTY ANTERIOR APPROACH;  Surgeon: Leandrew Koyanagi, MD;  Location: [facility];  Service: Orthopedics;  Laterality: Right;   TOTAL HIP ARTHROPLASTY Left 11/15/2018   TOTAL HIP ARTHROPLASTY Left 11/15/2018   Procedure: LEFT TOTAL HIP ARTHROPLASTY ANTERIOR APPROACH;  Surgeon: Leandrew Koyanagi, MD;  Location: [facility];  Service: Orthopedics;  Laterality: Left;   Social History   Socioeconomic History   Marital status: Single    Spouse name: Not on file   Number of children: Not on file   Years of education: Not on file   Highest education level: Not on file  Occupational History   Not on file  Tobacco Use   Smoking status: Former   Smokeless tobacco: Never   Tobacco comments:    Smoked lightly for approx 20 years, quit 1990s  Vaping Use   Vaping Use: Never used  Substance  and Sexual Activity   Alcohol use: Yes    Comment: 1-2 mixed drinks per week   Drug use: Yes    Types: Marijuana    Comment: occasional   Sexual activity: Not on file  Other Topics Concern   Not on file  Social History Narrative   Lives Home alone. Sister makes all health related decisions. Declined Advanced directive at this time   Social Determinants of Health   Financial Resource Strain: Not on file  Food Insecurity: Not on file  Transportation Needs: Not on file  Physical Activity: Not on file  Stress: Not on file  Social Connections: Not on file   Family History  Problem Relation Age of Onset   CAD Father        MI/CABG age 53   No Known Allergies Current Outpatient Medications  Medication Sig Dispense Refill    amLODipine (NORVASC) 5 MG tablet TAKE 1 TABLET (5 MG TOTAL) BY MOUTH DAILY. 90 tablet 1   aspirin EC 81 MG tablet Take 1 tablet (81 mg total) by mouth daily. 30 tablet 0   atorvastatin (LIPITOR) 10 MG tablet 1 tab PO Q Mon/Wed/Frid 12 tablet 5   carvedilol (COREG) 3.125 MG tablet Take 1 tablet (3.125 mg total) by mouth 2 (two) times daily. 180 tablet 2   fluticasone (FLONASE) 50 MCG/ACT nasal spray PLACE 1 SPRAY INTO BOTH NOSTRILS DAILY AS NEEDED FOR ALLERGIES OR RHINITIS. 16 mL 0   HYDROcodone-acetaminophen (NORCO) 5-325 MG tablet Take 1 tablet by mouth 2 (two) times daily as needed. 20 tablet 0   lisinopril (ZESTRIL) 10 MG tablet TAKE 1 TABLET BY MOUTH TWICE A DAY 180 tablet 1   methocarbamol (ROBAXIN) 500 MG tablet Take 1 tablet (500 mg total) by mouth 2 (two) times daily as needed for muscle spasms. 20 tablet 2   potassium chloride (KLOR-CON) 10 MEQ tablet TAKE 1 TABLET (10 MEQ TOTAL) BY MOUTH DAILY. DX: HYPOKALEMIA 90 tablet 1   No current facility-administered medications for this visit.   No results found.  Review of Systems:   A ROS was performed including pertinent positives and negatives as documented in the HPI.  Physical Exam :   Constitutional: NAD and appears stated age Neurological: Alert and oriented Psych: Appropriate affect and cooperative There were no vitals taken for this visit.   Comprehensive Musculoskeletal Exam:    Musculoskeletal Exam    Inspection Right Left  Skin No atrophy or winging No atrophy or winging  Palpation    Tenderness Lateral deltoid, biceps None  Range of Motion    Flexion (passive) 140 170  Flexion (active) 100 170  Abduction 90 170  ER at the side 40 70  Can reach behind back to Back pocket T12  Strength     4-5 supraspinatus, subscapularis with positive belly press Full  Special Tests    Pseudoparalytic No No  Neurologic    Fires PIN, radial, median, ulnar, musculocutaneous, axillary, suprascapular, long thoracic, and spinal  accessory innervated muscles. No abnormal sensibility  Vascular/Lymphatic    Radial Pulse 2+ 2+  Cervical Exam    Patient has symmetric cervical range of motion with negative Spurling's test.  Special Test: Positive speeds test     Imaging:   Xray (right shoulder 3 views): Normal  MRI (right shoulder): There is a full-thickness rotator cuff tear with some retraction to the level of the joint.  The muscle belly overall appears healthy with negative tangent sign on sagittal T1 view.  He does have medial biceps subluxation in the setting of a superior border of the subscapularis tear.  Mild degenerative findings about the glenohumeral joint  I personally reviewed and interpreted the radiographs.   Assessment:   61 y.o. male right-hand-dominant male presents with a right shoulder rotator cuff tear including the subscapularis tendon after an injury in February which was aggravated with an additional injury 2.5 weeks prior.  I did discuss treatment options with him at today's visit.  We did discuss possible conservative management although he has been working on a good rotator cuff strengthening program at home without any relief.  That effect this is an acute tear and I do believe that he would significantly benefit from repair so that we can hopefully restore his baseline function as it was prior to the injury.  I did discuss that acute tears versus chronic tears are somewhat different in nature.  I did discuss that with fixation of his acute rotator cuff tear the goal would be to stop the progression of any type of rotator cuff atrophy that may occur.  With regard to the rotator cuff itself, I do believe that he would benefit from repair of both the supraspinatus and subscapularis tendons.  To this effect given the fact that there is significant biceps subluxation I do believe that he will ultimately require a biceps tenodesis as well.  We will plan to perform this arthroscopically.  After  discussion of all of the treatment options he is elected for right shoulder arthroscopy with rotator cuff repair and biceps tenodesis  Plan :    -Plan for right shoulder arthroscopy with rotator cuff repair, subscapularis repair, biceps tenodesis   After a lengthy discussion of treatment options, including risks, benefits, alternatives, complications of surgical and nonsurgical conservative options, the patient elected surgical repair.   The patient  is aware of the material risks  and complications including, but not limited to injury to adjacent structures, neurovascular injury, infection, numbness, bleeding, implant failure, thermal burns, stiffness, persistent pain, failure to heal, disease transmission from allograft, need for further surgery, dislocation, anesthetic risks, blood clots, risks of death,and others. The probabilities of surgical success and failure discussed with patient given their particular co-morbidities.The time and nature of expected rehabilitation and recovery was discussed.The patient's questions were all answered preoperatively.  No barriers to understanding were noted. I explained the natural history of the disease process and Rx rationale.  I explained to the patient what I considered to be reasonable expectations given their personal situation.  The final treatment plan was arrived at through a shared patient decision making process model.   I personally saw and evaluated the patient, and participated in the management and treatment plan.  Vanetta Mulders, MD Attending Physician, Orthopedic Surgery  This document was dictated using Dragon voice recognition software. A reasonable attempt at proof reading has been made to minimize errors.

## 2021-10-18 ENCOUNTER — Other Ambulatory Visit (HOSPITAL_BASED_OUTPATIENT_CLINIC_OR_DEPARTMENT_OTHER): Payer: Self-pay | Admitting: Orthopaedic Surgery

## 2021-10-18 MED ORDER — OXYCODONE HCL 5 MG PO TABS: 5.0000 mg | ORAL_TABLET | ORAL | 0 refills | Status: DC | PRN

## 2021-10-28 ENCOUNTER — Telehealth: Payer: Self-pay | Admitting: Physician Assistant

## 2021-10-28 NOTE — Telephone Encounter (Signed)
[facility] advised [facility] is not in stock and do not know when they will receive it back in. Patient asked [facility] is another [facility]. [facility] [place]   [phone]   The number to contact patient is [phone]

## 2021-10-29 ENCOUNTER — Other Ambulatory Visit (HOSPITAL_BASED_OUTPATIENT_CLINIC_OR_DEPARTMENT_OTHER): Payer: Self-pay | Admitting: Orthopaedic Surgery

## 2021-10-29 ENCOUNTER — Other Ambulatory Visit: Payer: Self-pay | Admitting: Physician Assistant

## 2021-10-29 MED ORDER — OXYCODONE HCL 5 MG PO TABS: 5.0000 mg | ORAL_TABLET | ORAL | 0 refills | Status: DC | PRN

## 2021-10-29 NOTE — Telephone Encounter (Signed)
Dr. Sammuel Hines is treating him now and sent in his last narcotic.  Will need to send to him

## 2021-10-30 ENCOUNTER — Telehealth: Payer: Self-pay | Admitting: Cardiovascular Disease

## 2021-10-30 NOTE — Telephone Encounter (Signed)
Follow Up:    Patient is checking on the status of his clearance. His surgeon said they had not received it.

## 2021-11-01 ENCOUNTER — Telehealth: Payer: Self-pay

## 2021-11-01 NOTE — Telephone Encounter (Signed)
   Pre-operative Risk Assessment    Patient Name: Joshua [name]  DOB: [birth year] MRN: [id number]      Request for Surgical Clearance    Procedure:   Right shoulder Arthroscopy with rotator cuff repair and Biceps tenodesis   Date of Surgery:  Clearance TBD                                 Surgeon:  Dr. Vanetta Mulders  Surgeon's Group or Practice Name:  [facility] Phone number:  [phone] Fax number:  [phone]   Type of Clearance Requested:   - Medical    Type of Anesthesia:   Regional Bier Block    Additional requests/questions:    Tana Conch   11/01/2021, 3:47 PM

## 2021-11-01 NOTE — Telephone Encounter (Signed)
Please notify patient that we have not received a clearance for him.

## 2021-11-04 NOTE — Telephone Encounter (Signed)
Please see clearance request that  was entered in 11/01/21.

## 2021-11-04 NOTE — Telephone Encounter (Signed)
   Name: Joshua [name]  DOB: [birth year]  MRN: [id number]  Primary Cardiologist: Sanda Klein, MD  Chart reviewed as part of pre-operative protocol coverage. The patient has an upcoming visit scheduled with Dr. Sallyanne Kuster on 11/07/2021 at which time clearance can be addressed in case there are any issues that would impact surgical recommendations.  Right shoulder Arthroscopy with rotator cuff repair and Biceps tenodesis is not scheduled until date TBD as below. I added preop FYI to appointment note so that provider is aware to address at time of outpatient visit.  Per office protocol the cardiology provider should for their finalize clearance decision and recommendations regarding antiplatelet therapy to the requesting party below.    I will route this message as FYI to requesting party and remove this message from the pre-op box as separate pre-op APP input not needed at this time.   Please call with any questions.  Lenna Sciara, NP  11/04/2021, 12:10 PM

## 2021-11-07 ENCOUNTER — Encounter: Payer: Self-pay | Admitting: Cardiovascular Disease

## 2021-11-07 ENCOUNTER — Ambulatory Visit (INDEPENDENT_AMBULATORY_CARE_PROVIDER_SITE_OTHER): Payer: Medicaid Other | Admitting: Cardiovascular Disease

## 2021-11-07 VITALS — BP 120/78 | HR 65 | Ht 72.0 in | Wt 224.0 lb

## 2021-11-07 DIAGNOSIS — E785 Hyperlipidemia, unspecified: Secondary | ICD-10-CM | POA: Diagnosis not present

## 2021-11-07 DIAGNOSIS — I77810 Thoracic aortic ectasia: Secondary | ICD-10-CM | POA: Diagnosis not present

## 2021-11-07 DIAGNOSIS — I5032 Chronic diastolic (congestive) heart failure: Secondary | ICD-10-CM | POA: Diagnosis not present

## 2021-11-07 DIAGNOSIS — I10 Essential (primary) hypertension: Secondary | ICD-10-CM

## 2021-11-07 DIAGNOSIS — I2510 Atherosclerotic heart disease of native coronary artery without angina pectoris: Secondary | ICD-10-CM

## 2021-11-07 DIAGNOSIS — I350 Nonrheumatic aortic (valve) stenosis: Secondary | ICD-10-CM | POA: Diagnosis not present

## 2021-11-07 NOTE — Patient Instructions (Addendum)
Medication Instructions:  Dr. Sallyanne Kuster recommends Repatha (PCSK9). This is an injectable cholesterol medication. This medication will need prior approval with your insurance company, which we will work on. If the medication is not approved initially, we may need to do an appeal with your insurance. We will keep you updated on this process. This medication can be provided at some local pharmacies or be shipped to you from a specialty pharmacy.   *If you need a refill on your cardiac medications before your next appointment, please call your pharmacy*   Lab Work: Your provider would like for you to return in 3 months to have the following labs drawn: Fasting Lipid and CMET. You do not need an appointment for the lab. Once in our office lobby there is a podium where you can sign in and ring the doorbell to alert [place] that you are here. The lab is open from 8:00 am to 4 pm; closed for lunch from 12:45pm-1:45pm.  You may also go to any of these [facility] locations:   [facility] ([place]) - [place] [address] [address]   If you have labs (blood work) drawn today and your tests are completely normal, you will receive your results only by: [employer] (if you have MyChart) OR A paper copy in the mail If you have any lab test that is abnormal or we need to change your treatment, we will call you to review the results.   Testing/Procedures:  Dr. Sallyanne Kuster has ordered a Chest CT to be done in 3 months. This will take place at [place]. Non-Cardiac CT scanning, (CAT scanning), is a noninvasive, special x-ray that produces cross-sectional images of the body using x-rays and a computer. CT scans help physicians diagnose and treat medical conditions. For some CT exams, a contrast material is used to enhance visibility in the area of the body being studied. CT scans provide greater clarity and reveal more details than  regular x-ray exams.  Your physician has requested that you have an echocardiogram in April 2024. Echocardiography is a painless test that uses sound waves to create images of your heart. It provides your doctor with information about the size and shape of your heart and how well your heart's chambers and valves are working. You may receive an ultrasound enhancing agent through an IV if needed to better visualize your heart during the echo.This procedure takes approximately one hour. There are no restrictions for this procedure. This will take place at the [address], [address].    Follow-Up: At [facility], you and your health needs are our priority.  As part of our continuing mission to provide you with exceptional heart care, we have created designated Provider Care Teams.  These Care Teams include your primary Cardiologist (physician) and Advanced Practice Providers (APPs -  Physician Assistants and Nurse Practitioners) who all work together to provide you with the care you need, when you need it.  We recommend signing up for the patient portal called "MyChart".  Sign up information is provided on this After Visit Summary.  MyChart is used to connect with patients for Virtual Visits (Telemedicine).  Patients are able to view lab/test results, encounter notes, upcoming appointments, etc.  Non-urgent messages can be sent to your provider as well.   To learn more about what you can do with MyChart, go to [email or web address].    Your next appointment:  Follow up in April 2024 after the echo  Follow up with pharmd for starting Repatha

## 2021-11-07 NOTE — Progress Notes (Signed)
Cardiology Office Note:    Date:  11/08/2021   ID:  Joshua [name], DOB 02-Oct-1960, MRN [id number]  PCP:  Ladell Pier, MD  Cardiologist:  Sanda Klein, MD   Referring MD: Ladell Pier, MD   Chief Complaint  Patient presents with   Coronary Artery Disease   Cardiac Valve Problem    History of Present Illness:    Joshua [name] is a 61 y.o. male who presented with decompensated heart failure and severe cardiomyopathy (EF 25-30 %) in September 2018 and was identified as having severe 2 vessel coronary artery disease, with drug-eluting stents placed to the LAD and RCA. Following revascularization he has had remarkable improvement in LV systolic function which is now normal (EF 55-60%). He has a bicuspid aortic valve, moderate aortic stenosis (per echo 08/08/2021 mean gradient 25 mmHg, dimensionless index 0.27) and very mild aortic root enlargement (estimated to be 39 mm by echo).  Clinically, he feels great.  He denies any shortness of breath or chest pain at rest or with activity.  He does not have leg edema, claudication, focal neurological complaints, palpitations, dizziness or syncope.  He does okay gardening, even when the weather is pretty warm.  He has managed to lose 10 pounds this year.  He is now overweight/borderline obese (he weighs 220 pounds on his home scale).  He previously was on atorvastatin but this was stopped when his transaminases increased to the 200-400 range.  Work-up for viral hepatitis or autoimmune disease was negative.  His most recent LDL cholesterol performed in June 2021 was 125 on 09/18/2021.  Recently restarted atorvastatin and his ALT, which had normalized has increased to 58.  His blood pressure is well controlled and he does best when he takes the lisinopril in 2 divided doses.  He has not tolerated higher doses of carvedilol due to bradycardia.  Past Medical History:  Diagnosis Date   Aortic stenosis    CAD (coronary artery  disease), native coronary artery 12/26/2016   DES LAD & RCA, EF 25-30%   CHF (congestive heart failure) (HCC)    Dyslipidemia    Hypertension    Obesity    Osteoarthritis    Pre-diabetes    a. prior A1C 5.6, states he was on meds for this at one point.    Past Surgical History:  Procedure Laterality Date   CORONARY STENT INTERVENTION N/A 12/29/2016   Procedure: CORONARY STENT INTERVENTION;  Surgeon: Jettie Booze, MD;  Location: [facility];  Service: Cardiovascular;  Laterality: N/A;   RIGHT/LEFT HEART CATH AND CORONARY ANGIOGRAPHY N/A 12/29/2016   Procedure: RIGHT/LEFT HEART CATH AND CORONARY ANGIOGRAPHY;  Surgeon: Jettie Booze, MD;  Location: [facility];  Service: Cardiovascular;  Laterality: N/A;   TONSILLECTOMY     TOTAL HIP ARTHROPLASTY Right 04/26/2018   Procedure: RIGHT TOTAL HIP ARTHROPLASTY ANTERIOR APPROACH;  Surgeon: Leandrew Koyanagi, MD;  Location: [facility];  Service: Orthopedics;  Laterality: Right;   TOTAL HIP ARTHROPLASTY Left 11/15/2018   TOTAL HIP ARTHROPLASTY Left 11/15/2018   Procedure: LEFT TOTAL HIP ARTHROPLASTY ANTERIOR APPROACH;  Surgeon: Leandrew Koyanagi, MD;  Location: [facility];  Service: Orthopedics;  Laterality: Left;    Current Medications: Current Meds  Medication Sig   amLODipine (NORVASC) 5 MG tablet TAKE 1 TABLET (5 MG TOTAL) BY MOUTH DAILY.   aspirin EC 325 MG tablet Take 1 tablet (325 mg total) by mouth daily.   aspirin EC 81 MG tablet Take 1  tablet (81 mg total) by mouth daily.   atorvastatin (LIPITOR) 10 MG tablet 1 tab PO Q Mon/Wed/Frid   carvedilol (COREG) 3.125 MG tablet Take 1 tablet (3.125 mg total) by mouth 2 (two) times daily.   fluticasone (FLONASE) 50 MCG/ACT nasal spray PLACE 1 SPRAY INTO BOTH NOSTRILS DAILY AS NEEDED FOR ALLERGIES OR RHINITIS.   lisinopril (ZESTRIL) 10 MG tablet TAKE 1 TABLET BY MOUTH TWICE A DAY   methocarbamol (ROBAXIN) 500 MG tablet Take 1 tablet (500 mg total) by mouth 2 (two) times daily as needed  for muscle spasms.   potassium chloride (KLOR-CON) 10 MEQ tablet TAKE 1 TABLET (10 MEQ TOTAL) BY MOUTH DAILY. DX: HYPOKALEMIA     Allergies:   Patient has no known allergies.   Social History   Socioeconomic History   Marital status: Single    Spouse name: Not on file   Number of children: Not on file   Years of education: Not on file   Highest education level: Not on file  Occupational History   Not on file  Tobacco Use   Smoking status: Former   Smokeless tobacco: Never   Tobacco comments:    Smoked lightly for approx 20 years, quit 1990s  Vaping Use   Vaping Use: Never used  Substance and Sexual Activity   Alcohol use: Yes    Comment: 1-2 mixed drinks per week   Drug use: Yes    Types: Marijuana    Comment: occasional   Sexual activity: Not on file  Other Topics Concern   Not on file  Social History Narrative   Lives Home alone. Sister makes all health related decisions. Declined Advanced directive at this time   Social Determinants of Health   Financial Resource Strain: Not on file  Food Insecurity: Not on file  Transportation Needs: Not on file  Physical Activity: Not on file  Stress: Not on file  Social Connections: Not on file     Family History: The patient's family history includes CAD in his father.  ROS:   Please see the history of present illness.    All other systems are reviewed and are negative.   EKGs/Labs/Other Studies Reviewed:    EKG:  EKG is ordered today.  It shows sinus rhythm with mild first-degree AV block (224 ms), borderline left axis deviation at -30 degrees and incomplete right bundle branch block pattern.  There are only voltage criteria for LVH.  There are no ischemic repolarization changes.  QTc is 438 ms.  ECHO 08/08/2021:    1. Left ventricular ejection fraction, by estimation, is 55 to 60%. The  left ventricle has normal function. The left ventricle has no regional  wall motion abnormalities. There is mild left  ventricular hypertrophy.  Left ventricular diastolic parameters  are consistent with Grade II diastolic dysfunction (pseudonormalization).   2. Right ventricular systolic function is normal. The right ventricular  size is normal. Tricuspid regurgitation signal is inadequate for assessing  PA pressure.   3. Left atrial size was mildly dilated.   4. The mitral valve is normal in structure. No evidence of mitral valve  regurgitation. No evidence of mitral stenosis.   5. The aortic valve is tricuspid. There is severe calcifcation of the  aortic valve. Aortic valve regurgitation is mild. Moderate aortic valve  stenosis. Aortic valve area, by VTI measures 1.24 cm. Aortic valve mean  gradient measures 25.0 mmHg.    AV Area (VTI):     1.24 cm  AV  Vmax:           340.00 cm/s  AV Peak Grad:      46.2 mmHg  AV Mean Grad:      25.0 mmHg   LVOT/AV VTI ratio: 0.27  AI PHT:            443 msec   Recent Labs: 12/28/2020: BUN 15; Creatinine, Ser 0.88; Hemoglobin 17.4; Platelets 184; Potassium 4.5; Sodium 143 09/10/2021: ALT 58  Recent Lipid Panel    Component Value Date/Time   CHOL 114 09/10/2021 1132   TRIG 167 (H) 09/10/2021 1132   HDL 34 (L) 09/10/2021 1132   CHOLHDL 3.4 09/10/2021 1132   CHOLHDL 4.6 12/27/2016 0238   VLDL 18 12/27/2016 0238   LDLCALC 52 09/10/2021 1132    Physical Exam:    VS:  BP 120/78 (BP Location: Left Arm, Patient Position: Sitting, Cuff Size: Large)   Pulse 65   Ht 6' (1.829 m)   Wt 224 lb (101.6 kg)   SpO2 93%   BMI 30.38 kg/m     Wt Readings from Last 3 Encounters:  11/07/21 224 lb (101.6 kg)  09/10/21 233 lb 6.4 oz (105.9 kg)  04/30/21 234 lb 9.6 oz (106.4 kg)      General: Alert, oriented x3, no distress, borderline obese Head: no evidence of trauma, PERRL, EOMI, no exophtalmos or lid lag, no myxedema, no xanthelasma; normal ears, nose and oropharynx Neck: normal jugular venous pulsations and no hepatojugular reflux; brisk carotid pulses without  delay and bilateral carotid bruits radiate from the chest Chest: clear to auscultation, no signs of consolidation by percussion or palpation, normal fremitus, symmetrical and full respiratory excursions Cardiovascular: normal position and quality of the apical impulse, regular rhythm, normal first and second heart sounds, he has a very musical 3/6 early-mid peaking aortic ejection murmur that radiates broadly towards the apex as well as to his posterior left chest, no diastolic murmurs, rubs or gallops Abdomen: no tenderness or distention, no masses by palpation, no abnormal pulsatility or arterial bruits, normal bowel sounds, no hepatosplenomegaly Extremities: no clubbing, cyanosis or edema; 2+ radial, ulnar and brachial pulses bilaterally; 2+ right femoral, posterior tibial and dorsalis pedis pulses; 2+ left femoral, posterior tibial and dorsalis pedis pulses; no subclavian or femoral bruits Neurological: grossly nonfocal Psych: Normal mood and affect    ASSESSMENT:    1. Chronic diastolic heart failure ([place])   2. Coronary artery disease involving native coronary artery of native heart without angina pectoris   3. Aortic valve stenosis, nonrheumatic   4. Essential hypertension   5. Dyslipidemia   6. Mild dilation of ascending aorta (HCC)     PLAN:    In order of problems listed above:  CHF: Recovered left ventricular ejection fraction.  Due to ischemic cardiomyopathy, which presented with dyspnea rather than angina.  Systolic dysfunction resolved completely after revascularization.  He does not have symptoms or signs of congestive heart failure.  NYHA functional class I CAD: He has never had angina pectoris.  Two-vessel disease treated with percutaneous revascularization in 2018.  No clinical evidence of recurrent disease since then. AS: Reviewed his most recent echo.  I still think that most probably his aortic valve is bicuspid.  There is at most mild dilation of the ascending aorta.   There has been very little progression in his aortic stenosis over the last couple of years.  Reminded him to call if he develops exertional angina/dyspnea/syncope.  Anticipate need for valve replacement in  the next 2-3 years or so.  By that time I think it will be pretty clear that he would be preferred for a tissue prosthesis, discussed options for TAVR versus surgical aortic valve replacement.  Asked him to remain physically active and call us if he develops exertional complaints.  He should take care of any residual dental problems in the next year or 2 so we do not have to do with those issues when it comes time for aortic valve replacement. Bilateral carotid bruits: Bruits radiating from his aortic valve, no evidence for obstructive lesions on duplex ultrasound September 2019. HTN: Well-controlled.  Does not tolerate higher beta-blocker dose.  Lisinopril works better for him when he takes it twice daily. HLP: Markedly abnormal liver tests while he was taking atorvastatin, improved after his discontinuation, although some lingering abnormalities in alkaline phosphatase are still present.  Now that he is back on statin his transaminases are beginning to become abnormal again.   Target LDL less than 70.  Switch to Repatha. 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  This has not worsened on echo (maximum aortic root diameter 3.9 cm on echo, reevaluate w CT).  He will require a CT of the chest, as well as coronary angiography) before we decide on TAVR versus SAVR.    Medication Adjustments/Labs and Tests Ordered: Current medicines are reviewed at length with the patient today.  Concerns regarding medicines are outlined above.  Orders Placed This Encounter  Procedures   CT Chest Wo Contrast   Comprehensive metabolic panel   Lipid panel   AMB Referral to [facility]   EKG 12-Lead   ECHOCARDIOGRAM COMPLETE   No orders of the defined types were placed in this  encounter.   Signed, Sanda Klein, MD  11/08/2021 7:24 PM    [place]

## 2021-11-08 ENCOUNTER — Encounter: Payer: Self-pay | Admitting: Cardiovascular Disease

## 2021-11-10 ENCOUNTER — Other Ambulatory Visit (HOSPITAL_BASED_OUTPATIENT_CLINIC_OR_DEPARTMENT_OTHER): Payer: Self-pay | Admitting: Orthopaedic Surgery

## 2021-11-12 ENCOUNTER — Telehealth: Payer: Self-pay | Admitting: Cardiovascular Disease

## 2021-11-12 NOTE — Telephone Encounter (Signed)
I will forward this note to pre op provider to review if the pt has been cleared

## 2021-11-12 NOTE — Progress Notes (Signed)
Pre op provider asked me to forward ov note; as to notate in regard to clearance

## 2021-11-12 NOTE — Telephone Encounter (Signed)
[facility] called stating that are awaiting a complete clearance form be faxed over to their office for this pt. She stated she is aware that pt appt 11/07/21 served as his pre op visit, but they still haven't gotten the okay. Please advise.

## 2021-11-12 NOTE — Telephone Encounter (Signed)
   Patient Name: Joshua [name]  DOB: 06/03/1960 MRN: [id number]  Primary Cardiologist: Sanda Klein, MD  Chart reviewed as part of pre-operative protocol coverage. Given past medical history and time since last visit, based on ACC/AHA guidelines, ZAKI GERTSCH would be at acceptable risk for the planned procedure without further cardiovascular testing.   RCRI: 6.6   Patient can hold ASA if required for 7 days prior to procedure.  Please restart when deemed safest post procedure.  I will route this recommendation to the requesting party via Epic fax function and remove from pre-op pool.  Please call with questions.  Mable Fill, Marissa Nestle, NP 11/12/2021, 9:26 AM

## 2021-11-19 ENCOUNTER — Other Ambulatory Visit: Payer: Self-pay | Admitting: Internal Medicine

## 2021-11-19 ENCOUNTER — Other Ambulatory Visit (HOSPITAL_BASED_OUTPATIENT_CLINIC_OR_DEPARTMENT_OTHER): Payer: Self-pay | Admitting: Orthopaedic Surgery

## 2021-11-19 DIAGNOSIS — I10 Essential (primary) hypertension: Secondary | ICD-10-CM

## 2021-11-19 DIAGNOSIS — I2510 Atherosclerotic heart disease of native coronary artery without angina pectoris: Secondary | ICD-10-CM

## 2021-11-20 NOTE — Telephone Encounter (Signed)
Requested Prescriptions  Pending Prescriptions Disp Refills  . atorvastatin (LIPITOR) 10 MG tablet [Pharmacy Med Name: ATORVASTATIN 10 MG TABLET] 36 tablet 1    Sig: TAKE 1 TABLET BY MOUTH EVERY MON/WED/FRID     Cardiovascular:  Antilipid - Statins Failed - 11/19/2021 10:57 PM      Failed - Lipid Panel in normal range within the last 12 months    Cholesterol, Total  Date Value Ref Range Status  09/10/2021 114 100 - 199 mg/dL Final   LDL Chol Calc (NIH)  Date Value Ref Range Status  09/10/2021 52 0 - 99 mg/dL Final   HDL  Date Value Ref Range Status  09/10/2021 34 (L) >39 mg/dL Final   Triglycerides  Date Value Ref Range Status  09/10/2021 167 (H) 0 - 149 mg/dL Final         Passed - Patient is not pregnant      Passed - Valid encounter within last 12 months    Recent Outpatient Visits          2 months ago Essential hypertension   [place], Deborah B, MD   6 months ago Essential hypertension   [place], MD   10 months ago Essential hypertension   [place], Deborah B, MD   1 year ago Essential hypertension   [place], Deborah B, MD   1 year ago Essential hypertension   [place], MD      Future Appointments            In 2 weeks  [place], [place]   In 1 month Ladell Pier, MD [place]           . lisinopril (ZESTRIL) 10 MG tablet [Pharmacy Med Name: LISINOPRIL 10 MG TABLET] 180 tablet 0    Sig: TAKE 1 TABLET BY MOUTH TWICE A DAY     Cardiovascular:  ACE Inhibitors Failed - 11/19/2021 10:57 PM      Failed - Cr in normal range and within 180 days    Creatinine, Ser  Date Value Ref Range Status  12/28/2020 0.88 0.76 - 1.27 mg/dL Final         Failed - K in normal range and within 180  days    Potassium  Date Value Ref Range Status  12/28/2020 4.5 3.5 - 5.2 mmol/L Final         Passed - Patient is not pregnant      Passed - Last BP in normal range    BP Readings from Last 1 Encounters:  11/07/21 120/78         Passed - Valid encounter within last 6 months    Recent Outpatient Visits          2 months ago Essential hypertension   [place] Ladell Pier, MD   6 months ago Essential hypertension   [place] Ladell Pier, MD   10 months ago Essential hypertension   [place] Ladell Pier, MD   1 year ago Essential hypertension   [facility] Ladell Pier, MD   1 year ago Essential hypertension   [place] Ladell Pier, MD  Future Appointments            In 2 weeks  [place], [place]   In 1 month Ladell Pier, MD [place]

## 2021-11-27 ENCOUNTER — Other Ambulatory Visit: Payer: Self-pay

## 2021-11-27 ENCOUNTER — Encounter (HOSPITAL_COMMUNITY): Payer: Self-pay | Admitting: Orthopaedic Surgery

## 2021-11-27 NOTE — Anesthesia Preprocedure Evaluation (Signed)
Anesthesia Evaluation  Patient identified by MRN, date of birth, ID band Patient awake    Reviewed: Allergy & Precautions, NPO status , Patient's Chart, lab work & pertinent test results  History of Anesthesia Complications Negative for: history of anesthetic complications  Airway Mallampati: II  TM Distance: >3 FB Neck ROM: Full    Dental  (+) Teeth Intact, Dental Advisory Given   Pulmonary neg pulmonary ROS, former smoker,    Pulmonary exam normal        Cardiovascular hypertension, + CAD, + Cardiac Stents and +CHF (EF 25-30% 2018, now recovered)  Normal cardiovascular exam+ Valvular Problems/Murmurs AS   Echo 08/08/21: IMPRESSIONS  1. Left ventricular ejection fraction, by estimation, is 55 to 60%. The left ventricle has normal function. The left ventricle has no regional wall motion abnormalities. There is mild left ventricular hypertrophy. Left ventricular diastolic parameters are consistent with Grade II diastolic dysfunction (pseudonormalization).  2. Right ventricular systolic function is normal. The right ventricular size is normal. Tricuspid regurgitation signal is inadequate for assessing PA pressure.  3. Left atrial size was mildly dilated.  4. The mitral valve is normal in structure. No evidence of mitral valve regurgitation. No evidence of mitral stenosis.  5. The aortic valve is tricuspid. There is severe calcifcation of the aortic valve. Aortic valve regurgitation is mild. Moderate aortic valve stenosis. Aortic valve area, by VTI measures 1.24 cm. Aortic valve mean gradient measures 25.0 mmHg. Aortic valve peak gradient measures 46.2 mmHg. Ao Root 3.20 cm. Ao Asc 3.40 cm. - Comparison echo 05/21/20: Moderate aortic stenosis. Aortic valve area, by VTI measures 1.31 cm. Aortic valve mean gradient measures 25.0 mmHg. Aortic valve peak gradient measures 46.5 mmHg.     Neuro/Psych negative neurological ROS      GI/Hepatic negative GI ROS, Neg liver ROS,   Endo/Other  negative endocrine ROS  Renal/GU negative Renal ROS  negative genitourinary   Musculoskeletal  (+) Arthritis ,   Abdominal   Peds  Hematology negative hematology ROS (+)   Anesthesia Other Findings   Reproductive/Obstetrics                            Anesthesia Physical Anesthesia Plan  ASA: 3  Anesthesia Plan: General   Post-op Pain Management: Tylenol PO (pre-op)*, Toradol IV (intra-op)* and Regional block*   Induction: Intravenous  PONV Risk Score and Plan: 2 and Ondansetron, Dexamethasone, Treatment may vary due to age or medical condition and Midazolam  Airway Management Planned: Oral ETT  Additional Equipment: ClearSight  Intra-op Plan:   Post-operative Plan: Extubation in OR  Informed Consent: I have reviewed the patients History and Physical, chart, labs and discussed the procedure including the risks, benefits and alternatives for the proposed anesthesia with the patient or authorized representative who has indicated his/her understanding and acceptance.     Dental advisory given  Plan Discussed with:   Anesthesia Plan Comments: (PAT note written 11/27/2021 by Myra Gianotti, PA-C. )       Anesthesia Quick Evaluation

## 2021-11-27 NOTE — Progress Notes (Signed)
PCP - Dr. Karle Plumber Cardiologist - Sanda Klein, MD   PPM/ICD - denies   Chest x-ray - 11/22/18 EKG - 11/07/21 Stress Test - denies ECHO - 11/07/21 Cardiac Cath - 12/29/16  CPAP - denies  DM- denies   Blood Thinner Instructions: n/a Aspirin Instructions: Stop taking now  ERAS Protcol - clears until 0930  COVID TEST- n/a  Anesthesia review: yes, cardiac hx  Patient verbally denies any shortness of breath, fever, cough and chest pain during phone call   -------------  SDW INSTRUCTIONS given:  Your procedure is scheduled on 8/17.  Report to [facility] Main Entrance "A" at 10:00 A.M., and check in at the Admitting office.  Call this number if you have problems the morning of surgery:  [phone]   Remember:  Do not eat after midnight the night before your surgery  You may drink clear liquids until 0930  the morning of your surgery.   Clear liquids allowed are: Water, Non-Citrus Juices (without pulp), Carbonated Beverages, Clear Tea, Black Coffee Only, and Gatorade    Take these medicines the morning of surgery with A SIP OF WATER  Amlodipine Coreg Flonase Claritin Robaxin Oxycodone  As of today, STOP taking any Aspirin (unless otherwise instructed by your surgeon) Aleve, Naproxen, Ibuprofen, Motrin, Advil, Goody's, BC's, all herbal medications, fish oil, and all vitamins.                      Do not wear jewelry, make up, or nail polish            Do not wear lotions, powders, perfumes/colognes, or deodorant.            Do not shave 48 hours prior to surgery.  Men may shave face and neck.            Do not bring valuables to the hospital.            [facility] is not responsible for any belongings or valuables.  Do NOT Smoke (Tobacco/Vaping) 24 hours prior to your procedure If you use a CPAP at night, you may bring all equipment for your overnight stay.   Contacts, glasses, dentures or bridgework may not be worn into surgery.      For patients  admitted to the hospital, discharge time will be determined by your treatment team.   Patients discharged the day of surgery will not be allowed to drive home, and someone needs to stay with them for 24 hours.    Special instructions:   [facility]- Preparing For Surgery  Before surgery, you can play an important role. Because skin is not sterile, your skin needs to be as free of germs as possible. You can reduce the number of germs on your skin by washing with CHG (chlorahexidine gluconate) Soap before surgery.  CHG is an antiseptic cleaner which kills germs and bonds with the skin to continue killing germs even after washing.    Oral Hygiene is also important to reduce your risk of infection.  Remember - BRUSH YOUR TEETH THE MORNING OF SURGERY WITH YOUR REGULAR TOOTHPASTE  Please do not use if you have an allergy to CHG or antibacterial soaps. If your skin becomes reddened/irritated stop using the CHG.  Do not shave (including legs and underarms) for at least 48 hours prior to first CHG shower. It is OK to shave your face.  Please follow these instructions carefully.   Shower the [employer] SURGERY and the  MORNING OF SURGERY with DIAL Soap.   Pat yourself dry with a CLEAN TOWEL.  Wear CLEAN PAJAMAS to bed the night before surgery  Place CLEAN SHEETS on your bed the night of your first shower and DO NOT SLEEP WITH PETS.   Day of Surgery: Please shower morning of surgery  Wear Clean/Comfortable clothing the morning of surgery Do not apply any deodorants/lotions.   Remember to brush your teeth WITH YOUR REGULAR TOOTHPASTE.   Questions were answered. Patient verbalized understanding of instructions.

## 2021-11-27 NOTE — Progress Notes (Addendum)
Anesthesia Chart Review: Joshua [name]  Case: [id number] Date/Time: 11/28/21 1211   Procedure: RIGHT SHOULDER ARTHROSCOPY WITH ROTATOR CUFF REPAIR AND OPEN BICEPS TENODESIS (Right)   Anesthesia type: Regional   Pre-op diagnosis: RIGHT SHOULDER ROTATOR CUFF TEAR   Location: [place] OR ROOM 08 / [place]   Surgeons: Vanetta Mulders, MD       DISCUSSION: Patient is a 61 year old male scheduled for the above procedure.    History includes former smoker (quit 1990's), CAD (s/p DES mRCA & mLAD 12/29/16), ischemic cardiomyopathy (normalization of EF following DES x2 2018), combined chronic systolic and diastolic CHF (EF 99-35% 10/175, 55-60% 04/2017), aortic stenosis (moderate 07/2021 echo), HTN, dyslipidemia, pre-diabetes, osteoarthritis (right THA 05/13/21; left THA 11/15/18). A1c was 6.1% on 09/10/21.      Last seen by cardiologist Dr. Sallyanne Kuster on 11/07/21. He noted that when patient was previously on atorvastatin, it was stopped when his transaminases increased to the 200-400 range. By notes, work-up was negative for viral hepatitis (12/2017) or autoimmune disease. His LFTs normalized but began to trend up again when atorvastatin resumed, so plan is to switch him to [place]. He has not tolerated higher doses of carvedilol due to bradycardia, and his BP has been well controlled on divided doses of lisinopril. There was question of bicuspid AV on previous imaging, although 07/2021 echo read as tricuspid AV with moderate AS, aortic root 3.20 cm, ascending aorta 3.40 cm. A future CT chest planned to further evaluate. He is getting echocardiograms yearly to follow AS.  Preoperative cardiology input outlined on 11/12/21 by Ambrose Pancoast, NP, "Given past medical history and time since last visit, based on ACC/AHA guidelines, Dorrien Grunder Filsaime would be at acceptable risk for the planned procedure without further cardiovascular testing.    RCRI: 6.6    Patient can hold ASA if required for 7 days prior to procedure.  Please  restart when deemed safest post procedure."   He is a same day work-up, so labs and anesthesia team evaluation on the day of surgery.     VS:  BP Readings from Last 3 Encounters:  11/07/21 120/78  09/10/21 97/63  04/30/21 132/78   Pulse Readings from Last 3 Encounters:  11/07/21 65  09/10/21 63  04/30/21 (!) 56     PROVIDERS: Ladell Pier, MD is PCP  Croitoru, Dani Gobble, MD is cardiologist   LABS: He is for labs on arrival as indicated. Last lab results in [facility] include: Lab Results  Component Value Date   WBC 10.4 12/28/2020   HGB 17.4 12/28/2020   HCT 51.2 (H) 12/28/2020   PLT 184 12/28/2020   GLUCOSE 131 (H) 12/28/2020   ALT 58 (H) 09/10/2021   AST 21 09/10/2021   NA 143 12/28/2020   K 4.5 12/28/2020   CL 101 12/28/2020   CREATININE 0.88 12/28/2020   BUN 15 12/28/2020   CO2 24 12/28/2020   HGBA1C 6.1 (H) 09/10/2021    IMAGES: MRI Right Shoulder 10/11/21: IMPRESSION: 1. Massive rotator cuff tears. 2. Medial dislocation of the long head biceps tendon into the glenohumeral joint space. 3. Mild acromioclavicular and glenohumeral osteoarthritis. Moderate glenohumeral joint effusion.     EKG: 11/07/21 ([facility]):  Sinus rhythm with 1st degree AV block Left axis deviation Incomplete right bundle branch block Voltage criteria for left ventricular hypertrophy   CV: Echo 08/08/21: IMPRESSIONS   1. Left ventricular ejection fraction, by estimation, is 55 to 60%. The left ventricle has normal function. The left ventricle has  no regional wall motion abnormalities. There is mild left ventricular hypertrophy. Left ventricular diastolic parameters are consistent with Grade II diastolic dysfunction (pseudonormalization).   2. Right ventricular systolic function is normal. The right ventricular size is normal. Tricuspid regurgitation signal is inadequate for assessing PA pressure.   3. Left atrial size was mildly dilated.   4. The mitral valve is normal in  structure. No evidence of mitral valve regurgitation. No evidence of mitral stenosis.   5. The aortic valve is tricuspid. There is severe calcifcation of the aortic valve. Aortic valve regurgitation is mild. Moderate aortic valve stenosis. Aortic valve area, by VTI measures 1.24 cm. Aortic valve mean gradient measures 25.0 mmHg. Aortic valve peak gradient measures 46.2 mmHg. Ao Root 3.20 cm. Ao Asc 3.40 cm. - Comparison echo 05/21/20: Moderate aortic stenosis. Aortic valve area, by VTI measures 1.31 cm. Aortic valve mean gradient measures 25.0 mmHg. Aortic valve peak gradient measures 46.5 mmHg.    Carotid US 01/05/18: Final Interpretation: - Right Carotid: Velocities in the right ICA are consistent with a 1-39% stenosis. Non-hemodynamically significant plaque <50% noted in the CCA. - Left Carotid: Velocities in the left ICA are consistent with a 1-39% stenosis. Non-hemodynamically significant plaque noted in the CCA. - Vertebrals:  Bilateral vertebral arteries demonstrate antegrade flow. - Subclavians: Normal flow hemodynamics were seen in bilateral subclavian arteries.     Cardiac cath 12/29/16 (done following 12/27/16 echo showing LVEF 25-30%, possible bicuspid AV with mild-moderate AR, and suggestion of severe AS): Prox RCA lesion, 25 %stenosed. Dist RCA lesion, 25 %stenosed. Dist Cx lesion, 60 %stenosed. Prox Cx to Mid Cx lesion, 25 %stenosed. There is moderate to severe left ventricular systolic dysfunction. The left ventricular ejection fraction is 25-35% by visual estimate. LV end diastolic pressure is moderately elevated. There is no aortic valve stenosis. Mid RCA lesion, 75 %stenosed. A STENT RESOLUTE ONYX 4.0X30 drug eluting stent was successfully placed. Post intervention, there is a 0% residual stenosis. Mid LAD lesion, 75 %stenosed. A STENT RESOLUTE ONYX [id number] drug eluting stent was successfully placed, post dilated to > 3 mm. Post intervention, there is a 0% residual  stenosis. Hemodynamic findings consistent with mild pulmonary hypertension. Ao sat 90%, PA sat 62%; CO 4.4 L/min, CI 2.1; mean PA pressure 34 mm Hg - Severe two vessel CAD which likely contributed to cardiomyopathy. - Aortic valve was easily crossed several times.  0.035" wire would cross aortic valve on its own during catheter exchanges.  Mean Ao valve gradient 9 mm Hg.  Ao valve area 1.94 cm2.   - After discussion with Dr. Marlou Porch, we decided to proceed with PCI since aortic valve intervention was not required.     Past Medical History:  Diagnosis Date   Aortic stenosis    CAD (coronary artery disease), native coronary artery 12/26/2016   DES LAD & RCA, EF 25-30%   CHF (congestive heart failure) (HCC)    Dyslipidemia    Hypertension    Obesity    Osteoarthritis    Pre-diabetes    a. prior A1C 5.6, states he was on meds for this at one point.    Past Surgical History:  Procedure Laterality Date   CORONARY STENT INTERVENTION N/A 12/29/2016   Procedure: CORONARY STENT INTERVENTION;  Surgeon: Jettie Booze, MD;  Location: [place] CV LAB;  Service: Cardiovascular;  Laterality: N/A;   RIGHT/LEFT HEART CATH AND CORONARY ANGIOGRAPHY N/A 12/29/2016   Procedure: RIGHT/LEFT HEART CATH AND CORONARY ANGIOGRAPHY;  Surgeon: Jettie Booze,  MD;  Location: [place] [facility] LAB;  Service: Cardiovascular;  Laterality: N/A;   TONSILLECTOMY     TOTAL HIP ARTHROPLASTY Right 04/26/2018   Procedure: RIGHT TOTAL HIP ARTHROPLASTY ANTERIOR APPROACH;  Surgeon: Leandrew Koyanagi, MD;  Location: [place];  Service: Orthopedics;  Laterality: Right;   TOTAL HIP ARTHROPLASTY Left 11/15/2018   TOTAL HIP ARTHROPLASTY Left 11/15/2018   Procedure: LEFT TOTAL HIP ARTHROPLASTY ANTERIOR APPROACH;  Surgeon: Leandrew Koyanagi, MD;  Location: [place];  Service: Orthopedics;  Laterality: Left;    MEDICATIONS: No current facility-administered medications for this encounter.    amLODipine (NORVASC) 5 MG tablet   aspirin EC  325 MG tablet   aspirin EC 81 MG tablet   atorvastatin (LIPITOR) 10 MG tablet   carvedilol (COREG) 3.125 MG tablet   fluticasone (FLONASE) 50 MCG/ACT nasal spray   ibuprofen (ADVIL) 800 MG tablet   lisinopril (ZESTRIL) 10 MG tablet   loratadine (CLARITIN) 10 MG tablet   methocarbamol (ROBAXIN) 500 MG tablet   oxyCODONE (ROXICODONE) 5 MG immediate release tablet   potassium chloride (KLOR-CON) 10 MEQ tablet   Multiple Vitamins-Minerals (PRESERVISION AREDS 2) CAPS    Myra Gianotti, PA-C Surgical Short Stay/Anesthesiology [facility] Phone [phone] [facility] Phone [phone] 11/27/2021 10:18 AM

## 2021-11-28 ENCOUNTER — Encounter (HOSPITAL_COMMUNITY)
Admission: RE | Disposition: A | Discharge status: Home / Self Care | Payer: Self-pay | Source: Home / Self Care | Attending: Orthopaedic Surgery

## 2021-11-28 ENCOUNTER — Encounter (HOSPITAL_COMMUNITY): Payer: Self-pay | Admitting: Orthopaedic Surgery

## 2021-11-28 ENCOUNTER — Ambulatory Visit (HOSPITAL_COMMUNITY): Payer: Medicaid Other | Admitting: Vascular Surgery

## 2021-11-28 ENCOUNTER — Ambulatory Visit: Payer: Medicaid Other

## 2021-11-28 ENCOUNTER — Ambulatory Visit (HOSPITAL_BASED_OUTPATIENT_CLINIC_OR_DEPARTMENT_OTHER): Payer: Medicaid Other | Admitting: Vascular Surgery

## 2021-11-28 ENCOUNTER — Ambulatory Visit (HOSPITAL_COMMUNITY)
Admission: RE | Admit: 2021-11-28 | Discharge: 2021-11-28 | Disposition: A | Discharge status: Home / Self Care | Payer: Medicaid Other | Attending: Orthopaedic Surgery | Admitting: Orthopaedic Surgery

## 2021-11-28 ENCOUNTER — Other Ambulatory Visit: Payer: Self-pay

## 2021-11-28 DIAGNOSIS — Z955 Presence of coronary angioplasty implant and graft: Secondary | ICD-10-CM | POA: Diagnosis not present

## 2021-11-28 DIAGNOSIS — I2510 Atherosclerotic heart disease of native coronary artery without angina pectoris: Secondary | ICD-10-CM

## 2021-11-28 DIAGNOSIS — Z87891 Personal history of nicotine dependence: Secondary | ICD-10-CM | POA: Insufficient documentation

## 2021-11-28 DIAGNOSIS — M75101 Unspecified rotator cuff tear or rupture of right shoulder, not specified as traumatic: Secondary | ICD-10-CM | POA: Diagnosis not present

## 2021-11-28 DIAGNOSIS — M75121 Complete rotator cuff tear or rupture of right shoulder, not specified as traumatic: Secondary | ICD-10-CM | POA: Diagnosis not present

## 2021-11-28 DIAGNOSIS — I110 Hypertensive heart disease with heart failure: Secondary | ICD-10-CM

## 2021-11-28 DIAGNOSIS — I700 Atherosclerosis of aorta: Secondary | ICD-10-CM | POA: Diagnosis not present

## 2021-11-28 DIAGNOSIS — I509 Heart failure, unspecified: Secondary | ICD-10-CM | POA: Insufficient documentation

## 2021-11-28 DIAGNOSIS — I350 Nonrheumatic aortic (valve) stenosis: Secondary | ICD-10-CM | POA: Insufficient documentation

## 2021-11-28 DIAGNOSIS — G8918 Other acute postprocedural pain: Secondary | ICD-10-CM | POA: Diagnosis not present

## 2021-11-28 DIAGNOSIS — M7521 Bicipital tendinitis, right shoulder: Secondary | ICD-10-CM | POA: Diagnosis not present

## 2021-11-28 HISTORY — PX: SHOULDER ARTHROSCOPY WITH ROTATOR CUFF REPAIR AND OPEN BICEPS TENODESIS: SHX6677

## 2021-11-28 LAB — BASIC METABOLIC PANEL
Anion gap: 8 (ref 5–15)
BUN: 13 mg/dL (ref 8–23)
CO2: 25 mmol/L (ref 22–32)
Calcium: 9.4 mg/dL (ref 8.9–10.3)
Chloride: 106 mmol/L (ref 98–111)
Creatinine, Ser: 0.91 mg/dL (ref 0.61–1.24)
GFR, Estimated: >60 mL/min (ref >60)
Glucose, Bld: 134 mg/dL — ABNORMAL HIGH (ref 70–99)
Potassium: 4.1 mmol/L (ref 3.5–5.1)
Sodium: 139 mmol/L (ref 135–145)

## 2021-11-28 LAB — CBC
HCT: 43.1 % (ref 39.0–52.0)
Hemoglobin: 14.7 g/dL (ref 13.0–17.0)
MCH: 31.7 pg (ref 26.0–34.0)
MCHC: 34.1 g/dL (ref 30.0–36.0)
MCV: 93.1 fL (ref 80.0–100.0)
Platelets: 145 10*3/uL — ABNORMAL LOW (ref 150–400)
RBC: 4.63 MIL/uL (ref 4.22–5.81)
RDW: 12.8 % (ref 11.5–15.5)
WBC: 6.1 10*3/uL (ref 4.0–10.5)
nRBC: 0 % (ref 0.0–0.2)

## 2021-11-28 SURGERY — SHOULDER ARTHROSCOPY WITH ROTATOR CUFF REPAIR AND OPEN BICEPS TENODESIS
Anesthesia: General | Site: Shoulder | Laterality: Right

## 2021-11-28 MED ORDER — SODIUM CHLORIDE 0.9 % IR SOLN
Status: DC | PRN
  Administered 2021-11-28 (×8): 3000 mL

## 2021-11-28 MED ORDER — ONDANSETRON HCL 4 MG/2ML IJ SOLN
INTRAMUSCULAR | Status: DC | PRN
  Administered 2021-11-28: 4 mg via INTRAVENOUS

## 2021-11-28 MED ORDER — FENTANYL CITRATE (PF) 250 MCG/5ML IJ SOLN
INTRAMUSCULAR | Status: AC
  Filled 2021-11-28: qty 5

## 2021-11-28 MED ORDER — FENTANYL CITRATE (PF) 100 MCG/2ML IJ SOLN: 100.0000 ug | Freq: Once | INTRAMUSCULAR | Status: AC

## 2021-11-28 MED ORDER — GLYCOPYRROLATE PF 0.2 MG/ML IJ SOSY
PREFILLED_SYRINGE | INTRAMUSCULAR | Status: DC | PRN
  Administered 2021-11-28: .2 mg via INTRAVENOUS

## 2021-11-28 MED ORDER — BUPIVACAINE HCL (PF) 0.25 % IJ SOLN
INTRAMUSCULAR | Status: AC
  Filled 2021-11-28: qty 30

## 2021-11-28 MED ORDER — 0.9 % SODIUM CHLORIDE (POUR BTL) OPTIME
TOPICAL | Status: DC | PRN
  Administered 2021-11-28: 1000 mL

## 2021-11-28 MED ORDER — GABAPENTIN 300 MG PO CAPS
300.0000 mg | ORAL_CAPSULE | Freq: Once | ORAL | Status: AC
  Administered 2021-11-28: 300 mg via ORAL
  Filled 2021-11-28: qty 1

## 2021-11-28 MED ORDER — BUPIVACAINE LIPOSOME 1.3 % IJ SUSP
INTRAMUSCULAR | Status: DC | PRN
  Administered 2021-11-28: 10 mL via PERINEURAL

## 2021-11-28 MED ORDER — ORAL CARE MOUTH RINSE: 15.0000 mL | Freq: Once | OROMUCOSAL | Status: AC

## 2021-11-28 MED ORDER — MIDAZOLAM HCL 2 MG/2ML IJ SOLN: 2.0000 mg | Freq: Once | INTRAMUSCULAR | Status: AC

## 2021-11-28 MED ORDER — FENTANYL CITRATE (PF) 100 MCG/2ML IJ SOLN
INTRAMUSCULAR | Status: AC
  Administered 2021-11-28: 100 ug via INTRAVENOUS
  Filled 2021-11-28: qty 2

## 2021-11-28 MED ORDER — ROCURONIUM BROMIDE 10 MG/ML (PF) SYRINGE
PREFILLED_SYRINGE | INTRAVENOUS | Status: DC | PRN
  Administered 2021-11-28: 100 mg via INTRAVENOUS

## 2021-11-28 MED ORDER — LACTATED RINGERS IV SOLN: INTRAVENOUS | Status: DC

## 2021-11-28 MED ORDER — LIDOCAINE 2% (20 MG/ML) 5 ML SYRINGE
INTRAMUSCULAR | Status: DC | PRN
  Administered 2021-11-28: 40 mg via INTRAVENOUS

## 2021-11-28 MED ORDER — TRANEXAMIC ACID-NACL 1000-0.7 MG/100ML-% IV SOLN
1000.0000 mg | INTRAVENOUS | Status: AC
  Administered 2021-11-28: 1000 mg via INTRAVENOUS
  Filled 2021-11-28: qty 100

## 2021-11-28 MED ORDER — ACETAMINOPHEN 500 MG PO TABS
1000.0000 mg | ORAL_TABLET | Freq: Once | ORAL | Status: AC
  Administered 2021-11-28: 1000 mg via ORAL
  Filled 2021-11-28: qty 2

## 2021-11-28 MED ORDER — EPINEPHRINE PF 1 MG/ML IJ SOLN
INTRAMUSCULAR | Status: AC
  Filled 2021-11-28: qty 1

## 2021-11-28 MED ORDER — EPINEPHRINE PF 1 MG/ML IJ SOLN
INTRAMUSCULAR | Status: DC | PRN
  Administered 2021-11-28: 1 mg

## 2021-11-28 MED ORDER — CEFAZOLIN SODIUM-DEXTROSE 2-4 GM/100ML-% IV SOLN
2.0000 g | INTRAVENOUS | Status: AC
  Administered 2021-11-28: 2 g via INTRAVENOUS
  Filled 2021-11-28: qty 100

## 2021-11-28 MED ORDER — SUGAMMADEX SODIUM 200 MG/2ML IV SOLN
INTRAVENOUS | Status: DC | PRN
  Administered 2021-11-28: 500 mg via INTRAVENOUS

## 2021-11-28 MED ORDER — PROPOFOL 10 MG/ML IV BOLUS
INTRAVENOUS | Status: DC | PRN
  Administered 2021-11-28: 200 mg via INTRAVENOUS

## 2021-11-28 MED ORDER — BUPIVACAINE HCL (PF) 0.5 % IJ SOLN
INTRAMUSCULAR | Status: DC | PRN
  Administered 2021-11-28: 15 mL via PERINEURAL

## 2021-11-28 MED ORDER — MIDAZOLAM HCL 2 MG/2ML IJ SOLN
INTRAMUSCULAR | Status: AC
  Administered 2021-11-28: 2 mg via INTRAVENOUS
  Filled 2021-11-28: qty 2

## 2021-11-28 MED ORDER — PHENYLEPHRINE HCL-NACL 20-0.9 MG/250ML-% IV SOLN
INTRAVENOUS | Status: DC | PRN
  Administered 2021-11-28: 40 ug/min via INTRAVENOUS

## 2021-11-28 MED ORDER — EPHEDRINE SULFATE-NACL 50-0.9 MG/10ML-% IV SOSY
PREFILLED_SYRINGE | INTRAVENOUS | Status: DC | PRN
  Administered 2021-11-28 (×4): 5 mg via INTRAVENOUS

## 2021-11-28 MED ORDER — PROPOFOL 10 MG/ML IV BOLUS
INTRAVENOUS | Status: AC
  Filled 2021-11-28: qty 20

## 2021-11-28 MED ORDER — CHLORHEXIDINE GLUCONATE 0.12 % MT SOLN
15.0000 mL | Freq: Once | OROMUCOSAL | Status: AC
  Administered 2021-11-28: 15 mL via OROMUCOSAL
  Filled 2021-11-28: qty 15

## 2021-11-28 MED ORDER — PHENYLEPHRINE 80 MCG/ML (10ML) SYRINGE FOR IV PUSH (FOR BLOOD PRESSURE SUPPORT)
PREFILLED_SYRINGE | INTRAVENOUS | Status: DC | PRN
  Administered 2021-11-28 (×2): 80 ug via INTRAVENOUS

## 2021-11-28 SURGICAL SUPPLY — 72 items
ANCHOR JUGGERKNOT TRPL 2 2.9 (Anchor) IMPLANT
ANCHOR SUT QUATTRO KNTLS 4.5 (Anchor) IMPLANT
ANCHR JUGGERKNOT 2.9 WT/BL DL (Anchor) IMPLANT
BAG COUNTER SPONGE SURGICOUNT (BAG) ×1 IMPLANT
BLADE SURG 11 STRL SS (BLADE) ×1 IMPLANT
CANNULA 5.75X71 LONG (CANNULA) IMPLANT
CANNULA TWIST IN 8.25X7CM (CANNULA) IMPLANT
CHLORAPREP W/TINT 26 (MISCELLANEOUS) ×1 IMPLANT
COVER SURGICAL LIGHT HANDLE (MISCELLANEOUS) ×1 IMPLANT
DRAPE EXTREMITY T 121X128X90 (DISPOSABLE) IMPLANT
DRAPE HALF SHEET 40X57 (DRAPES) IMPLANT
DRAPE INCISE IOBAN 66X45 STRL (DRAPES) ×2 IMPLANT
DRAPE STERI 35X30 U-POUCH (DRAPES) ×1 IMPLANT
DRAPE U-SHAPE 47X51 STRL (DRAPES) ×2 IMPLANT
DRSG PAD ABDOMINAL 8X10 ST (GAUZE/BANDAGES/DRESSINGS) ×3 IMPLANT
DW OUTFLOW CASSETTE/TUBE SET (MISCELLANEOUS) IMPLANT
ELECT REM PT RETURN 9FT ADLT (ELECTROSURGICAL) ×1
ELECTRODE REM PT RTRN 9FT ADLT (ELECTROSURGICAL) ×1 IMPLANT
FILTER STRAW FLUID ASPIR (MISCELLANEOUS) ×1 IMPLANT
GAUZE SPONGE 4X4 12PLY STRL (GAUZE/BANDAGES/DRESSINGS) IMPLANT
GAUZE SPONGE 4X4 12PLY STRL LF (GAUZE/BANDAGES/DRESSINGS) ×1 IMPLANT
GAUZE XEROFORM 1X8 LF (GAUZE/BANDAGES/DRESSINGS) ×1 IMPLANT
GLOVE BIOGEL PI IND STRL 8 (GLOVE) ×2 IMPLANT
GLOVE BIOGEL PI INDICATOR 8 (GLOVE) ×2
GLOVE ECLIPSE 8.0 STRL XLNG CF (GLOVE) ×1 IMPLANT
GLOVE SURG ENC MOIS LTX SZ6 (GLOVE) ×3 IMPLANT
GLOVE SURG LTX SZ8 (GLOVE) ×1 IMPLANT
GLOVE SURG SS PI 7.5 STRL IVOR (GLOVE) IMPLANT
GLOVE SURG SYN 7.5  E (GLOVE) ×3
GLOVE SURG SYN 7.5 E (GLOVE) ×3 IMPLANT
GLOVE SURG SYN 7.5 PF PI (GLOVE) ×3 IMPLANT
GLOVE SURG UNDER POLY LF SZ6.5 (GLOVE) ×2 IMPLANT
GOWN STRL REUS W/ TWL LRG LVL3 (GOWN DISPOSABLE) ×3 IMPLANT
GOWN STRL REUS W/TWL LRG LVL3 (GOWN DISPOSABLE) ×3
KIT BASIN OR (CUSTOM PROCEDURE TRAY) ×1 IMPLANT
KIT STABILIZATION SHOULDER (MISCELLANEOUS) IMPLANT
KIT TURNOVER KIT B (KITS) ×1 IMPLANT
MANIFOLD NEPTUNE II (INSTRUMENTS) ×1 IMPLANT
NDL HYPO 25X1 1.5 SAFETY (NEEDLE) ×1 IMPLANT
NDL SCORPION MULTI FIRE (NEEDLE) IMPLANT
NDL SPNL 18GX3.5 QUINCKE PK (NEEDLE) ×1 IMPLANT
NDL SUT PASSER RTC (NEEDLE) IMPLANT
NEEDLE HYPO 25X1 1.5 SAFETY (NEEDLE) ×1 IMPLANT
NEEDLE SCORPION MULTI FIRE (NEEDLE) ×1 IMPLANT
NEEDLE SPNL 18GX3.5 QUINCKE PK (NEEDLE) ×1 IMPLANT
NEEDLE SUT PASSER RTC (NEEDLE) ×1 IMPLANT
NS IRRIG 1000ML POUR BTL (IV SOLUTION) ×1 IMPLANT
PACK SHOULDER (CUSTOM PROCEDURE TRAY) ×1 IMPLANT
PAD ARMBOARD 7.5X6 YLW CONV (MISCELLANEOUS) ×2 IMPLANT
PORT APPOLLO RF 90DEGREE MULTI (SURGICAL WAND) IMPLANT
RESTRAINT HEAD UNIVERSAL NS (MISCELLANEOUS) ×1 IMPLANT
SLING ARM IMMOBILIZER MED (SOFTGOODS) IMPLANT
SLING ULTRA II L (ORTHOPEDIC SUPPLIES) IMPLANT
SPONGE T-LAP 4X18 ~~LOC~~+RFID (SPONGE) ×2 IMPLANT
STRIP CLOSURE SKIN 1/2X4 (GAUZE/BANDAGES/DRESSINGS) ×1 IMPLANT
SUCTION FRAZIER HANDLE 10FR (MISCELLANEOUS) ×1
SUCTION TUBE FRAZIER 10FR DISP (MISCELLANEOUS) ×1 IMPLANT
SUT BROADBAND TAPE 2PK 1.5 (SUTURE) IMPLANT
SUT ETHILON 3 0 PS 1 (SUTURE) ×1 IMPLANT
SUT MNCRL AB 3-0 PS2 18 (SUTURE) ×1 IMPLANT
SUT VIC AB 0 CT1 27 (SUTURE) ×1
SUT VIC AB 0 CT1 27XBRD ANBCTR (SUTURE) ×1 IMPLANT
SUT VIC AB 1 CT1 27 (SUTURE) ×1
SUT VIC AB 1 CT1 27XBRD ANBCTR (SUTURE) ×1 IMPLANT
SUT VIC AB 2-0 CT1 27 (SUTURE) ×1
SUT VIC AB 2-0 CT1 TAPERPNT 27 (SUTURE) ×1 IMPLANT
SYR 20ML LL LF (SYRINGE) ×2 IMPLANT
SYR 3ML LL SCALE MARK (SYRINGE) ×1 IMPLANT
SYR TB 1ML LUER SLIP (SYRINGE) ×1 IMPLANT
TOWEL GREEN STERILE (TOWEL DISPOSABLE) ×1 IMPLANT
TOWEL GREEN STERILE FF (TOWEL DISPOSABLE) ×1 IMPLANT
TUBING ARTHROSCOPY IRRIG 16FT (MISCELLANEOUS) ×1 IMPLANT

## 2021-11-28 NOTE — Op Note (Signed)
Date of Surgery: 11/28/2021  INDICATIONS: Mr. Marchio is a 61 y.o.-year-old male with right shoulder massive rotator cuff tear as well as subscapularis tear biceps subluxation who has elected for operative repair.  The risk and benefits of the procedure were discussed in detail and documented in the pre-operative evaluation.   PREOPERATIVE DIAGNOSES: Right shoulder, acute on chronic rotator cuff tear and biceps tendinitis, with full thickness subscapularis tear  POSTOPERATIVE DIAGNOSIS: Same.  PROCEDURE: Arthroscopic extensive debridement - 29823 Supraspinatus Tendon, Anterior Labrum, and Superior Labrum Arthroscopic subacromial decompression - 09381 Arthroscopic rotator cuff repair - 82993 Arthroscopic biceps tenodesis - 71696  SURGEON: Yevonne Pax MD  ASSISTANT: Raynelle Fanning, ATC  ANESTHESIA:  general plus interscalene nerve block  IV FLUIDS AND URINE: See anesthesia record.  ANTIBIOTICS: Ancef  ESTIMATED BLOOD LOSS: 10 mL.  IMPLANTS:  Implant Name Type Inv. Item Serial No. Manufacturer Lot No. LRB No. Used Action  ANCHOR SUT QUATTRO KNTLS 4.5 - [id number] Anchor ANCHOR SUT QUATTRO KNTLS 4.5  [employer]IMMER [employer]ON(ORTH,TRAU,BIO,SG) [id number] Right 1 Implanted  Jugger Knot Soft Anchor Triple Loaded Implant     [id number] Right 1 Implanted  ANCHR JUGGERKNOT 2.9 WT/BL DL - [id number] Anchor ANCHR JUGGERKNOT 2.9 WT/BL DL  ZIMMER [employer]ON(ORTH,TRAU,BIO,SG) [id number] Right 1 Implanted  ANCHOR SUT QUATTRO KNTLS 4.5 - [id number] Anchor ANCHOR SUT QUATTRO KNTLS 4.5  ZIMMER RECON(ORTH,TRAU,BIO,SG) [id number] Right 1 Implanted    DRAINS: None  CULTURES: None  COMPLICATIONS: none  PROCEDURE:    OPERATIVE FINDING: Exam under anesthesia:   Examination under anesthesia revealed forward elevation of 150 degrees.  With the arm at the side, there was 65 degrees of external rotation.  There is a 1+ anterior load shift and a 1+ posterior load shift.    Arthroscopic findings  demonstrated: Articular space: Normal. Chondral surfaces: Normal Biceps: Significant subluxation and fraying Subscapularis: Full-thickness tear Supraspinatus: Full-thickness tear with retraction to the joint space Infraspinatus: Partial tearing    I identified the patient in the pre-operative holding area.  I marked the operative right shoulder with my initials. I reviewed the risks and benefits of the proposed surgical intervention and the patient wished to proceed.  Anesthesia was then performed with regional block.  The patient was transferred to the operative suite and placed in the beach chair position with all bony prominences padded.     SCDs were placed on bilateral lower extremity. Appropriate antibiotics was administered within 1 hour before incision.  Anesthesia was induced.  The operative extremity was then prepped and draped in standard fashion. A time out was performed confirming the correct extremity, correct patient and correct procedure.   The arthroscope was introduced in the glenohumeral joint from a posterior portal.  An anterior portal was created.  The shoulder was examined and the above findings were noted.     With an arthroscopic shaver and a wand ablator, synovitis throughout the  shoulder was resected.  The arthroscopic shaver was used to excise torn portions of the labrum back to a stable margin. Specifically this was done for the anterior superior and posterior labrum as well as the rotator interval in order to free up the torn subscapularis tendon.    At this time the biceps was tagged with a self passing device with a #2 nonabsorbable suture.  This was done through the anterior portal.  A subsequent lateral portal was established through the subacromial space and the suture was retrieved out through this portal.  Using this lateral portal, an additional #2 nonabsorbable  tape was passed in a [place] and repeated fashion through the biceps tendon 3 times.  At this time 2  luggage tag style sutures were brought through the superior and distal portion of the subscapularis tendon and all 3 of the sutures were then placed into an anchor into the bicipital groove to perform a biceps tenodesis as well as a subscapularis repair.  This provided anatomic restoration.   Through visualization from intra-articular, the footprint of the rotator cuff was debrided of soft tissues back to bleeding bone.  This was done with an arthroscopic shaver.     The rotator cuff was then approached through the subacromial space. Anterior, lateral, and posterior portals were used.  Bursectomy was performed with an arthroscopic shaver and [employer] wand.  The soft tissues on the undersurface of the acromion and clavicle were resected with the arthroscopic shaver and wand. There was good excursion that was noted of the tendon back to its footprint which would be amendable to an repair.   The rotator cuff was repaired with a double row configuration with two medial row all suture suture anchors as noted above with sutures passed from anterior to posterior in horizontal fashion with a self passing suture device.  The posterior 2 sutures were tied with knots in order to ultimately reapproximate the tendon to its anatomic footprint. A total of 8 limbs of suture were passed.  The sutures were placed with a single lateral row anchor.  This provided excellent anatomic footprint approximation.   The shoulder was irrigated.  The arthroscopic instruments were removed.  Wounds were closed with 3-0 nylon sutures.  A sterile dressing was applied with xeroform, 4x8s, abdominal pad, and tape. An Gar Gibbon was placed and the upper extremity was placed in a shoulder immobilizer.  The patient tolerated the procedure well and was taken to the recovery room in stable condition.  All counts were correct in the case. The patient tolerated the procedure well and was taken to the recovery room in stable  condition.    POSTOPERATIVE PLAN: He will be in a sling until seen by postoperative physical therapy.  I will see him back in 2 weeks for suture removal.  He will be placed on aspirin for blood clot prevention.  Yevonne Pax, MD 3:40 PM

## 2021-11-28 NOTE — Progress Notes (Signed)
Orthopedic Tech Progress Note Patient Details:  Maddex Garlitz [facility] 1960/06/12 [id number]  Ortho Devices Type of Ortho Device: Abduction pillow Ortho Device/Splint Interventions: Ordered      Danton Sewer A Josephine Rudnick 11/28/2021, 3:55 PM

## 2021-11-28 NOTE — Brief Op Note (Signed)
   Brief Op Note  Date of Surgery: 11/28/2021  Preoperative Diagnosis: RIGHT SHOULDER ROTATOR CUFF TEAR  Postoperative Diagnosis: same  Procedure: Procedure(s): RIGHT SHOULDER ARTHROSCOPY WITH ROTATOR CUFF REPAIR AND OPEN BICEPS TENODESIS  Implants: Implant Name Type Inv. Item Serial No. Manufacturer Lot No. LRB No. Used Action  ANCHOR SUT QUATTRO KNTLS 4.5 - [id number] Anchor ANCHOR SUT QUATTRO KNTLS 4.5  ZIMMER [employer]ON(ORTH,TRAU,BIO,SG) [id number] Right 1 Implanted  Jugger Knot Soft Anchor Triple Loaded Implant     [id number] Right 1 Implanted  ANCHR JUGGERKNOT 2.9 WT/BL DL - [id number] Anchor ANCHR JUGGERKNOT 2.9 WT/BL DL  ZIMMER [employer]ON(ORTH,TRAU,BIO,SG) [id number] Right 1 Implanted  ANCHOR SUT QUATTRO KNTLS 4.5 - [id number] Anchor ANCHOR SUT QUATTRO KNTLS 4.5  ZIMMER RECON(ORTH,TRAU,BIO,SG) [id number] Right 1 Implanted    Surgeons: Surgeon(s): Vanetta Mulders, MD  Anesthesia: Regional    Estimated Blood Loss: See anesthesia record  Complications: None  Condition to PACU: Stable  Yevonne Pax, MD 11/28/2021 3:37 PM

## 2021-11-28 NOTE — Anesthesia Procedure Notes (Signed)
Procedure Name: Intubation Date/Time: 11/28/2021 1:49 PM  Performed by: Georgia Duff, CRNAPre-anesthesia Checklist: Patient identified, Emergency Drugs available, Suction available and Patient being monitored Patient Re-evaluated:Patient Re-evaluated prior to induction Oxygen Delivery Method: Circle System Utilized Preoxygenation: Pre-oxygenation with 100% oxygen Induction Type: IV induction Ventilation: Mask ventilation without difficulty Laryngoscope Size: Mac and 4 Grade View: Grade II Tube type: Oral Tube size: 7.5 mm Number of attempts: 1 Airway Equipment and Method: Stylet and Oral airway Placement Confirmation: ETT inserted through vocal cords under direct vision, positive ETCO2 and breath sounds checked- equal and bilateral Secured at: 21 cm Tube secured with: Tape Dental Injury: Teeth and Oropharynx as per pre-operative assessment

## 2021-11-28 NOTE — Transfer of Care (Signed)
Immediate Anesthesia Transfer of Care Note  Patient: Marcques Wrightsman Hunger  Procedure(s) Performed: RIGHT SHOULDER ARTHROSCOPY WITH ROTATOR CUFF REPAIR AND OPEN BICEPS TENODESIS (Right: Shoulder)  Patient Location: PACU  Anesthesia Type:General  Level of Consciousness: drowsy and patient cooperative  Airway & Oxygen Therapy: Patient Spontanous Breathing and Patient connected to nasal cannula oxygen  Post-op Assessment: Report given to RN and Post -op Vital signs reviewed and stable  Post vital signs: Reviewed and stable  Last Vitals:  Vitals Value Taken Time  BP 102/61 11/28/21 1600  Temp    Pulse 59 11/28/21 1609  Resp 16 11/28/21 1609  SpO2 98 % 11/28/21 1609  Vitals shown include unvalidated device data.  Last Pain:  Vitals:   11/28/21 1250  TempSrc:   PainSc: 0-No pain         Complications: No notable events documented.

## 2021-11-28 NOTE — Anesthesia Procedure Notes (Addendum)
Anesthesia Regional Block: Interscalene brachial plexus block   Pre-Anesthetic Checklist: , timeout performed,  Correct Patient, Correct Site, Correct Laterality,  Correct Procedure, Correct Position, site marked,  Risks and benefits discussed,  Surgical consent,  Pre-op evaluation,  At surgeon's request and post-op pain management  Laterality: Right  Prep: chloraprep       Needles:  Injection technique: Single-shot  Needle Type: Echogenic Stimulator Needle     Needle Length: 10cm  Needle Gauge: 20     Additional Needles:   Procedures:,,,, ultrasound used (permanent image in chart),,    Narrative:  Start time: 11/28/2021 12:20 PM End time: 11/28/2021 12:24 PM Injection made incrementally with aspirations every 5 mL.  Performed by: Personally  Anesthesiologist: Lidia Collum, MD  Additional Notes: Standard monitors applied. Skin prepped. Good needle visualization with ultrasound. Injection made in 5cc increments with no resistance to injection. Patient tolerated the procedure well.

## 2021-11-28 NOTE — Discharge Instructions (Signed)
     Discharge Instructions    Attending Surgeon: Vanetta Mulders, MD Office Phone Number: [phone]   Diagnosis and Procedures:    Surgeries Performed: Right shoulder rotator cuff repair biceps tenodesis   Discharge Plan:    Diet: Resume usual diet. Begin with light or bland foods.  Drink plenty of fluids.  Activity:  Keep sling and dressing in place until your follow up visit in Physical Therapy You are advised to go home directly from the hospital or surgical center. Restrict your activities.  GENERAL INSTRUCTIONS: 1.  Keep your surgical site elevated above your heart for at least 5-7 days or longer to prevent swelling. This will improve your comfort and your overall recovery following surgery.     2. Please call Dr. Eddie Dibbles office at [phone] with questions Monday-Friday during business hours. If no one answers, please leave a message and someone should get back to the patient within 24 hours. For emergencies please call 911 or proceed to the emergency room.   3. Patient to notify surgical team if experiences any of the following: Bowel/Bladder dysfunction, uncontrolled pain, nerve/muscle weakness, incision with increased drainage or redness, nausea/vomiting and Fever greater than 101.0 F.  Be alert for signs of infection including redness, streaking, odor, fever or chills. Be alert for excessive pain or bleeding and notify your surgeon immediately.  WOUND INSTRUCTIONS:   Leave your dressing/cast/splint in place until your post operative visit.  Keep it clean and dry.  Always keep the incision clean and dry until the staples/sutures are removed. If there is no drainage from the incision you should keep it open to air. If there is drainage from the incision you must keep it covered at all times until the drainage stops  Do not soak in a bath tub, hot tub, pool, lake or other body of water until 21 days after your surgery and your incision is completely dry and healed.   If you have removable sutures (or staples) they must be removed 10-14 days (unless otherwise instructed) from the day of your surgery.     1)  Elevate the extremity as much as possible.  2)  Keep the dressing clean and dry.  3)  Please call us if the dressing becomes wet or dirty.  4)  If you are experiencing worsening pain or worsening swelling, please call.     MEDICATIONS: Resume all previous home medications at the previous prescribed dose and frequency unless otherwise noted Start taking the  pain medications on an as-needed basis as prescribed  Please taper down pain medication over the next week following surgery.  Ideally you should not require a refill of any narcotic pain medication.  Take pain medication with food to minimize nausea. In addition to the prescribed pain medication, you may take over-the-counter pain relievers such as Tylenol.  Do NOT take additional tylenol if your pain medication already has tylenol in it.  Aspirin '325mg'$  daily for four weeks.      FOLLOWUP INSTRUCTIONS: 1. Follow up at the Physical Therapy Clinic 3-4 days following surgery. This appointment should be scheduled unless other arrangements have been made.The Physical Therapy scheduling number is [phone] if an appointment has not already been arranged.  2. Contact Dr. Eddie Dibbles office during office hours at [phone] or the practice after hours line at [phone] for non-emergencies. For medical emergencies call 911.   Discharge Location: Home

## 2021-11-28 NOTE — H&P (Signed)
Chief Complaint: Right shoulder pain        History of Present Illness:      Joshua [name] is a 61 y.o. male right-hand-dominant male presents with right shoulder pain and limited overhead activity after he had a fall in February this year.  He presents today as a referral from my partner Dr. He states that since that time he was attempting to work on a series of band exercises for strengthening of the shoulder although unfortunately this has not improved his overhead motion or strength.  Approximately 2 and half weeks ago he was picking up a heavy item and felt another pop in the shoulder.  Since that time he has had extremely significant pain while at rest and at sleep.  He is not able to lay on the side.  He has been taking muscle relaxers for pain.  He is currently disabled as a result of his congestive heart failure.  He was treated with cardiac stenting with restoration of his baseline cardiac function.  Does see a cardiologist routinely.  He is overall active and does still do significant work in his yard and around his house.  That being said he is quite limited and he is not able to generate essentially any strength overhead.  He is here today for further assessment.  He denies any feeling that the shoulder dislocated during the initial injury.       Surgical History:   None   PMH/PSH/Family History/Social History/Meds/Allergies:         Past Medical History:  Diagnosis Date   Aortic stenosis     CAD (coronary artery disease), native coronary artery 12/26/2016    DES LAD & RCA, EF 25-30%   CHF (congestive heart failure) (HCC)     Dyslipidemia     Hypertension     Obesity     Osteoarthritis     Pre-diabetes      a. prior A1C 5.6, states he was on meds for this at one point.         Past Surgical History:  Procedure Laterality Date   CORONARY STENT INTERVENTION N/A 12/29/2016    Procedure: CORONARY STENT INTERVENTION;  Surgeon: Jettie Booze, MD;  Location: [facility];  Service: Cardiovascular;  Laterality: N/A;   RIGHT/LEFT HEART CATH AND CORONARY ANGIOGRAPHY N/A 12/29/2016    Procedure: RIGHT/LEFT HEART CATH AND CORONARY ANGIOGRAPHY;  Surgeon: Jettie Booze, MD;  Location: [facility];  Service: Cardiovascular;  Laterality: N/A;   TONSILLECTOMY       TOTAL HIP ARTHROPLASTY Right 04/26/2018    Procedure: RIGHT TOTAL HIP ARTHROPLASTY ANTERIOR APPROACH;  Surgeon: Leandrew Koyanagi, MD;  Location: [facility];  Service: Orthopedics;  Laterality: Right;   TOTAL HIP ARTHROPLASTY Left 11/15/2018   TOTAL HIP ARTHROPLASTY Left 11/15/2018    Procedure: LEFT TOTAL HIP ARTHROPLASTY ANTERIOR APPROACH;  Surgeon: Leandrew Koyanagi, MD;  Location: [facility];  Service: Orthopedics;  Laterality: Left;    Social History         Socioeconomic History   Marital status: Single      Spouse name: Not on file   Number of children: Not on file   Years of education: Not on file   Highest education level: Not on file  Occupational History   Not on file  Tobacco Use   Smoking status: Former   Smokeless tobacco: Never   Tobacco comments:  Smoked lightly for approx 20 years, quit 1990s  Vaping Use   Vaping Use: Never used  Substance and Sexual Activity   Alcohol use: Yes      Comment: 1-2 mixed drinks per week   Drug use: Yes      Types: Marijuana      Comment: occasional   Sexual activity: Not on file  Other Topics Concern   Not on file  Social History Narrative    Lives Home alone. Sister makes all health related decisions. Declined Advanced directive at this time    Social Determinants of Health    Financial Resource Strain: Not on file  Food Insecurity: Not on file  Transportation Needs: Not on file  Physical Activity: Not on file  Stress: Not on file  Social Connections: Not on file         Family History  Problem Relation Age of Onset   CAD Father          MI/CABG age 52    No Known Allergies        Current Outpatient Medications  Medication Sig Dispense Refill   amLODipine (NORVASC) 5 MG tablet TAKE 1 TABLET (5 MG TOTAL) BY MOUTH DAILY. 90 tablet 1   aspirin EC 81 MG tablet Take 1 tablet (81 mg total) by mouth daily. 30 tablet 0   atorvastatin (LIPITOR) 10 MG tablet 1 tab PO Q Mon/Wed/Frid 12 tablet 5   carvedilol (COREG) 3.125 MG tablet Take 1 tablet (3.125 mg total) by mouth 2 (two) times daily. 180 tablet 2   fluticasone (FLONASE) 50 MCG/ACT nasal spray PLACE 1 SPRAY INTO BOTH NOSTRILS DAILY AS NEEDED FOR ALLERGIES OR RHINITIS. 16 mL 0   HYDROcodone-acetaminophen (NORCO) 5-325 MG tablet Take 1 tablet by mouth 2 (two) times daily as needed. 20 tablet 0   lisinopril (ZESTRIL) 10 MG tablet TAKE 1 TABLET BY MOUTH TWICE A DAY 180 tablet 1   methocarbamol (ROBAXIN) 500 MG tablet Take 1 tablet (500 mg total) by mouth 2 (two) times daily as needed for muscle spasms. 20 tablet 2   potassium chloride (KLOR-CON) 10 MEQ tablet TAKE 1 TABLET (10 MEQ TOTAL) BY MOUTH DAILY. DX: HYPOKALEMIA 90 tablet 1    No current facility-administered medications for this visit.    Imaging Results (Last 48 hours)  No results found.     Review of Systems:   A ROS was performed including pertinent positives and negatives as documented in the HPI.   Physical Exam :   Constitutional: NAD and appears stated age Neurological: Alert and oriented Psych: Appropriate affect and cooperative There were no vitals taken for this visit.    Comprehensive Musculoskeletal Exam:     Musculoskeletal Exam      Inspection Right Left  Skin No atrophy or winging No atrophy or winging  Palpation      Tenderness Lateral deltoid, biceps None  Range of Motion      Flexion (passive) 140 170  Flexion (active) 100 170  Abduction 90 170  ER at the side 40 70  Can reach behind back to Back pocket T12  Strength        4-5 supraspinatus, subscapularis with positive belly press Full  Special Tests      Pseudoparalytic No No   Neurologic      Fires PIN, radial, median, ulnar, musculocutaneous, axillary, suprascapular, long thoracic, and spinal accessory innervated muscles. No abnormal sensibility  Vascular/Lymphatic      Radial Pulse 2+ 2+  Cervical Exam      Patient has symmetric cervical range of motion with negative Spurling's test.  Special Test: Positive speeds test        Imaging:   Xray (right shoulder 3 views): Normal   MRI (right shoulder): There is a full-thickness rotator cuff tear with some retraction to the level of the joint.  The muscle belly overall appears healthy with a borderline tangent sign on sagittal T1 view.  He does have medial biceps subluxation in the setting of a superior border of the subscapularis tear.  Mild degenerative findings about the glenohumeral joint   I personally reviewed and interpreted the radiographs.     Assessment:   61 y.o. male right-hand-dominant male presents with a right shoulder rotator cuff tear including the subscapularis tendon after an injury in February which was aggravated with an additional injury 2.5 weeks prior.  I did discuss treatment options with him at today's visit.  We did discuss possible conservative management although he has been working on a good supervised rotator cuff strengthening program at home without any relief.  That effect this is an acute tear and I do believe that he would significantly benefit from repair so that we can hopefully restore his baseline function as it was prior to the injury.  I did discuss that acute tears versus chronic tears are somewhat different in nature.  I did discuss that with fixation of his acute rotator cuff tear the goal would be to stop the progression of any type of rotator cuff atrophy that may occur.  With regard to the rotator cuff itself, I do believe that he would benefit from repair of both the supraspinatus and subscapularis tendons.  To this effect given the fact that there is significant biceps  subluxation I do believe that he will ultimately require a biceps tenodesis as well. I did discuss the role of possible collagen graft augmentation given the significant of the tear size and possibility of doing this in a mini open fashion. He understands this. After discussion of all of the treatment options he is elected for right shoulder arthroscopy with rotator cuff repair and biceps tenodesis   Plan :     -Plan for right shoulder arthroscopy with rotator cuff repair, subscapularis repair, biceps tenodesis, possible mini open approach     After a lengthy discussion of treatment options, including risks, benefits, alternatives, complications of surgical and nonsurgical conservative options, the patient elected surgical repair.    The patient  is aware of the material risks  and complications including, but not limited to injury to adjacent structures, neurovascular injury, infection, numbness, bleeding, implant failure, thermal burns, stiffness, persistent pain, failure to heal, disease transmission from allograft, need for further surgery, dislocation, anesthetic risks, blood clots, risks of death,and others. The probabilities of surgical success and failure discussed with patient given their particular co-morbidities.The time and nature of expected rehabilitation and recovery was discussed.The patient's questions were all answered preoperatively.  No barriers to understanding were noted. I explained the natural history of the disease process and Rx rationale.  I explained to the patient what I considered to be reasonable expectations given their personal situation.  The final treatment plan was arrived at through a shared patient decision making process model.     I personally saw and evaluated the patient, and participated in the management and treatment plan.   Vanetta Mulders, MD Attending Physician, Orthopedic Surgery   This document was dictated using Dragon voice recognition software.  A  reasonable attempt at proof reading has been made to minimize errors.

## 2021-11-28 NOTE — Interval H&P Note (Signed)
History and Physical Interval Note:  11/28/2021 12:25 PM  Joshua [name]  has presented today for surgery, with the diagnosis of [place].  The various methods of treatment have been discussed with the patient and family. After consideration of risks, benefits and other options for treatment, the patient has consented to  Procedure(s): RIGHT SHOULDER ARTHROSCOPY WITH ROTATOR CUFF REPAIR AND OPEN BICEPS TENODESIS (Right) as a surgical intervention.  The patient's history has been reviewed, patient examined, no change in status, stable for surgery.  I have reviewed the patient's chart and labs.  Questions were answered to the patient's satisfaction.     Vanetta Mulders

## 2021-11-29 NOTE — Addendum Note (Signed)
Addendum  created 11/29/21 1345 by Lidia Collum, MD   Clinical Note Signed, Intraprocedure Blocks edited, SmartForm saved

## 2021-11-29 NOTE — Anesthesia Postprocedure Evaluation (Signed)
Anesthesia Post Note  Patient: Azlan Hanway Taite  Procedure(s) Performed: RIGHT SHOULDER ARTHROSCOPY WITH ROTATOR CUFF REPAIR AND OPEN BICEPS TENODESIS (Right: Shoulder)     Patient location during evaluation: PACU Anesthesia Type: General Level of consciousness: awake and alert Pain management: pain level controlled Vital Signs Assessment: post-procedure vital signs reviewed and stable Respiratory status: spontaneous breathing, nonlabored ventilation and respiratory function stable Cardiovascular status: blood pressure returned to baseline and stable Postop Assessment: no apparent nausea or vomiting Anesthetic complications: no   No notable events documented.  Last Vitals:  Vitals:   11/28/21 1615 11/28/21 1630  BP: 106/69 128/78  Pulse: 62 60  Resp: 17 15  Temp:  (!) 36.4 C  SpO2: 98% 99%    Last Pain:  Vitals:   11/28/21 1630  TempSrc:   PainSc: 0-No pain   Pain Goal:                   Lidia Collum

## 2021-12-01 ENCOUNTER — Encounter (HOSPITAL_COMMUNITY): Payer: Self-pay | Admitting: Orthopaedic Surgery

## 2021-12-02 ENCOUNTER — Encounter (HOSPITAL_BASED_OUTPATIENT_CLINIC_OR_DEPARTMENT_OTHER): Payer: Self-pay

## 2021-12-02 ENCOUNTER — Telehealth (HOSPITAL_BASED_OUTPATIENT_CLINIC_OR_DEPARTMENT_OTHER): Payer: Self-pay | Admitting: Orthopaedic Surgery

## 2021-12-02 ENCOUNTER — Ambulatory Visit (HOSPITAL_BASED_OUTPATIENT_CLINIC_OR_DEPARTMENT_OTHER): Payer: Medicaid Other | Attending: Orthopaedic Surgery | Admitting: Physical Therapy

## 2021-12-02 DIAGNOSIS — M25611 Stiffness of right shoulder, not elsewhere classified: Secondary | ICD-10-CM | POA: Insufficient documentation

## 2021-12-02 DIAGNOSIS — M6281 Muscle weakness (generalized): Secondary | ICD-10-CM | POA: Insufficient documentation

## 2021-12-02 DIAGNOSIS — M25511 Pain in right shoulder: Secondary | ICD-10-CM | POA: Insufficient documentation

## 2021-12-02 NOTE — Telephone Encounter (Signed)
PT came in to office and wanted to know if he is supposed to be taking any medications. I did not see any medications listed for pt to take in post op note and explained that, but he still questioned that he was supposed to given something at post op. Advised that nurse would call back to go over his plethora of questions.

## 2021-12-03 ENCOUNTER — Encounter (HOSPITAL_BASED_OUTPATIENT_CLINIC_OR_DEPARTMENT_OTHER): Payer: Self-pay | Admitting: Physical Therapy

## 2021-12-03 ENCOUNTER — Ambulatory Visit (HOSPITAL_BASED_OUTPATIENT_CLINIC_OR_DEPARTMENT_OTHER): Payer: Medicaid Other | Admitting: Physical Therapy

## 2021-12-03 DIAGNOSIS — M25611 Stiffness of right shoulder, not elsewhere classified: Secondary | ICD-10-CM | POA: Diagnosis present

## 2021-12-03 DIAGNOSIS — M25511 Pain in right shoulder: Secondary | ICD-10-CM

## 2021-12-03 DIAGNOSIS — M6281 Muscle weakness (generalized): Secondary | ICD-10-CM

## 2021-12-03 NOTE — Therapy (Incomplete)
OUTPATIENT PHYSICAL THERAPY SHOULDER EVALUATION   Patient Name: KREED KAUFFMAN MRN: [id number] DOB:06-26-1960, 61 y.o., male Today's Date: 12/04/2021   PT End of Session - 12/03/21 1218     Visit Number 1    Number of Visits 26    Date for PT Re-Evaluation 02/25/22    Authorization Type [employer] MCD    PT Start Time 1119   Pt arrived 16 mins late to Rx.   PT Stop Time 1154    PT Time Calculation (min) 35 min    Activity Tolerance Patient tolerated treatment well    Behavior During Therapy WFL for tasks assessed/performed             Past Medical History:  Diagnosis Date   Aortic stenosis    CAD (coronary artery disease), native coronary artery 12/26/2016   DES LAD & RCA, EF 25-30%   CHF (congestive heart failure) (HCC)    Dyslipidemia    Hypertension    Obesity    Osteoarthritis    Pre-diabetes    a. prior A1C 5.6, states he was on meds for this at one point.   Past Surgical History:  Procedure Laterality Date   CORONARY STENT INTERVENTION N/A 12/29/2016   Procedure: CORONARY STENT INTERVENTION;  Surgeon: Jettie Booze, MD;  Location: [facility];  Service: Cardiovascular;  Laterality: N/A;   RIGHT/LEFT HEART CATH AND CORONARY ANGIOGRAPHY N/A 12/29/2016   Procedure: RIGHT/LEFT HEART CATH AND CORONARY ANGIOGRAPHY;  Surgeon: Jettie Booze, MD;  Location: [facility];  Service: Cardiovascular;  Laterality: N/A;   SHOULDER ARTHROSCOPY WITH ROTATOR CUFF REPAIR AND OPEN BICEPS TENODESIS Right 11/28/2021   Procedure: RIGHT SHOULDER ARTHROSCOPY WITH ROTATOR CUFF REPAIR AND OPEN BICEPS TENODESIS;  Surgeon: Vanetta Mulders, MD;  Location: [facility];  Service: Orthopedics;  Laterality: Right;   TONSILLECTOMY     TOTAL HIP ARTHROPLASTY Right 04/26/2018   Procedure: RIGHT TOTAL HIP ARTHROPLASTY ANTERIOR APPROACH;  Surgeon: Leandrew Koyanagi, MD;  Location: [facility];  Service: Orthopedics;  Laterality: Right;   TOTAL HIP ARTHROPLASTY Left 11/15/2018   TOTAL HIP  ARTHROPLASTY Left 11/15/2018   Procedure: LEFT TOTAL HIP ARTHROPLASTY ANTERIOR APPROACH;  Surgeon: Leandrew Koyanagi, MD;  Location: [facility];  Service: Orthopedics;  Laterality: Left;   Patient Active Problem List   Diagnosis Date Noted   Biceps tendinitis of right upper extremity    Rotator cuff syndrome of right shoulder 10/16/2021   Other insomnia 04/30/2021   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   COVID-19 vaccine regimen to maintain immunity completed 04/27/2020   Statin intolerance 12/10/2018   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/15/2018   Chronic left shoulder pain 06/08/2018   Status post total hip replacement, right 04/26/2018   Mild dilation of ascending aorta ([place]) 12/26/2017   Dyslipidemia 12/26/2017   Bilateral carotid bruits 12/26/2017   Nocturnal hypoxemia 06/29/2017   Primary osteoarthritis of both hips 06/29/2017   Chronic diastolic heart failure ([place])    Coronary artery disease involving native coronary artery of native heart without angina pectoris 03/31/2017   Ischemic cardiomyopathy 03/31/2017   Marijuana use 03/31/2017   Prediabetes 03/31/2017   Aortic valve insufficiency 12/28/2016   Mitral valve regurgitation 12/28/2016   Obesity 12/28/2016   Aortic valve stenosis, nonrheumatic    Essential hypertension     REFERRING PROVIDER: Vanetta Mulders, MD  REFERRING DIAG: M75.101 (ICD-10-CM) - Rotator cuff syndrome of right shoulder   THERAPY DIAG:  Right shoulder pain,  unspecified chronicity  Stiffness of right shoulder, not elsewhere classified  Muscle weakness (generalized)  Rationale for Evaluation and Treatment Rehabilitation  ONSET DATE: DOS 11/29/2019  SUBJECTIVE:                                                                                                                                                                                      SUBJECTIVE STATEMENT: Pt was grabbing the spare tire rack and was swung to the ground in February.   Pt later helping his dad into bed and felt another pop in the shoulder having increased pain.  Pt had x rays and MRI prior to surgery.  Pt had massive RTC tears.  He underwent surgery on 8/17.   Pt is 5 days s/p Arthroscopic supraspinatus and subscapularis repairs, biceps tenodesis, debridement of labrum, and subacromial decompression.  Pt is sling dependent and is limited with all of his ADLs/IADLs and self care activities.  Pt is unable to perform overhead and reaching activities with R UE.  Pain woke him up this AM.  He feels better with ice.   Pt states he also has L shoulder pain  PERTINENT HISTORY: -supraspinatus and subscapularis repairs, biceps tenodesis, debridement of labrum, and subacromial decompression on 11/28/21 -L shoulder pain, CHF with coronary Stent placement, R and L THA in 2020, OA   PAIN:  Are you having pain? Yes NPRS:  Current:  3-4/10, Best:  3/10, Worst:  9/10  PRECAUTIONS: Other: per surgical protocol, PROM  WEIGHT BEARING RESTRICTIONS Yes R UE  FALLS:  Has patient fallen in last 6 months? 1 fall, this injury  LIVING ENVIRONMENT: Lives with: lives alone   OCCUPATION: He is retired and on disability.   PLOF: Independent; Pt was able to perform his ADLs and IADLs and reaching activities independently.  PATIENT GOALS Reach top shelf, reach behind head.  OBJECTIVE:   DIAGNOSTIC FINDINGS:  Pt had x rays and MRI prior to surgery.  PATIENT SURVEYS:  Time limited today due to pt arriving late, did not give UEFI. Will give UEFI next visit.   COGNITION:  Overall cognitive status: Within functional limits for tasks assessed     R hand dominant  OBSERVATION: Pt wearing DonJoy sling with abd pillow and iceman wrap.    UPPER EXTREMITY ROM:   Active ROM Right eval Left eval  Shoulder flexion  154  Shoulder extension    Shoulder abduction  146  Shoulder adduction    Shoulder internal rotation    Shoulder external rotation    Elbow flexion     Elbow extension    Wrist flexion    Wrist extension    Wrist  ulnar deviation    Wrist radial deviation    Wrist pronation    Wrist supination    (Blank rows = not tested)  R shoulder PROM not tested due to time constraints.   UPPER EXTREMITY MMT:  Not tested due to healing constraints/surgical protocol    TODAY'S TREATMENT:  Pt asked about squeezing ball on his sling.  PT instructed pt that was fine to perform grip squeezing with the ball on his sling.  PT unable to establish HEP today due to pt arriving late.    PATIENT EDUCATION: Education details: post op protocol and restrictions, sling positioning, sling compliance, objective findings, POC, dx, and relevant anatomy.  Educated pt in the importance of being compliant with wearing sling and not using R UE.   Person educated: Patient Education method: Customer service manager Education comprehension: verbalized understanding, returned demonstration, and needs further education   HOME EXERCISE PROGRAM: Will give next visit.  Pt instructed to perform grip squeezing with ball on sling  ASSESSMENT:  CLINICAL IMPRESSION: Patient is a 61 y.o. male 5 days s/p Arthroscopic supraspinatus and subscapularis repairs, biceps tenodesis, debridement of labrum, and subacromial decompression. He has R shoulder pain, limited R shoulder ROM, and muscle weakness in R UE.  Pt is sling dependent and is limited with all of his ADLs/IADLs and self care activities.  Pt is unable to perform overhead and reaching activities with R UE.  Evaluation was limited due to pt arriving late.  Pt should benefit from skilled PT services per protocol to address impairments and to assist in restoring desired level of function.    OBJECTIVE IMPAIRMENTS decreased activity tolerance, decreased endurance, decreased ROM, decreased strength, hypomobility, impaired flexibility, impaired UE functional use, and pain.   ACTIVITY LIMITATIONS carrying, lifting, sleeping,  bathing, dressing, reach over head, and hygiene/grooming  PARTICIPATION LIMITATIONS: meal prep, cleaning, laundry, driving, shopping, and community activity  PERSONAL FACTORS 1-2 comorbidities: L shoulder pain and OA  are also affecting patient's functional outcome.   REHAB POTENTIAL: Good  CLINICAL DECISION MAKING: Stable/uncomplicated  EVALUATION COMPLEXITY: Low   GOALS:   SHORT TERM GOALS: Target date: 12/31/2021  Pt will be independent and compliant with HEP for improved pain, ROM, and strength.  Baseline: Goal status: INITIAL Target date: 12/31/2021    2.  Pt will tolerate PROM per protocol without adverse effects for improved ROM.  Baseline:  Goal status: INITIAL Target date:  12/24/2021  3.  Pt will demo PROM to 110-120 deg in flexion and 0 deg in ER for improved stiffness and mobility.  Baseline:  Goal status: INITIAL  4.  Pt will tolerate AAROM without adverse effects for progression of protocol.  Baseline:  Goal status: INITIAL  5. Pt will demo supine shoulder AAROM to be 120 deg in flexion and in 40 deg in ER for progression of AAROM and protocol Baseline:  Goal status: INITIAL Target date:  01/21/2022   6.   Pt will wean out of sling per MD allowance without adverse effects Baseline:  Goal status: INITIAL Target date:  01/14/2022  7.  Pt will demo R shoulder flexion and scaption AROM to at least 100 deg in standing without significant shoulder hike for improved UE elevation. Baseline:  Goal status: INITIAL Target date:  02/18/2022  8.  Pt will be able to perform his self care activities with no > than minimal difficulty.   Baseline:  Goal status: INITIAL Target date: 02/25/2022       LONG TERM GOALS:  Pt will demo R shoulder AROM to be [facility] t/o for performance of ADLs and IADLs.  Baseline:  Goal status: INITIAL Target date:  03/25/2022   2.  Pt will be able to perform his ADLs and IADLs without significant difficulty and pain. Baseline:   Goal status: INITIAL Target date:  03/25/2022  3.  Pt will be able to perform his normal reaching and overhead activities without significant pain and limitation. Baseline:  Goal status: INITIAL Target date:  03/25/2022  4.  Pt will demo at least 4/5 MMT strength t/o R shoulder for improved tolerance with and performance of daily activities, household chores, and functional lifting.  Baseline:  Goal status: INITIAL Target Date:  03/25/2022  5.  Pt will be independent with advanced HEP for improved shoulder strength and stability to assist with returning to desired level of function and performing functional carrying and lifting. Baseline:  Goal status: INITIAL    PLAN: PT FREQUENCY:  1x/wk x 4 weeks, 1-2x/wk x 2 weeks, and 2x/wk x 10 weeks  PT DURATION: other: 16 weeks  PLANNED INTERVENTIONS: Therapeutic exercises, Therapeutic activity, Neuromuscular re-education, Patient/Family education, Self Care, Joint mobilization, Aquatic Therapy, Dry Needling, Electrical stimulation, Spinal mobilization, Cryotherapy, Moist heat, scar mobilization, Taping, Ultrasound, Manual therapy, and Re-evaluation  PLAN FOR NEXT SESSION:  Cont with shoulder PROM per Dr. Eddie Dibbles protocol.  Assess PROM per protocol next visit.  Pt had a subscapularis repair also, will use subscapularis repair protocol also.  Establish HEP.  Will give UEFI next visit.   Check all possible CPT codes: 30092 - PT Re-evaluation, 97110- Therapeutic Exercise, [phone]- Neuro Re-education, 97140 - Manual Therapy, 97530 - Therapeutic Activities, 97535 - Self Care, 97014 - Electrical stimulation (unattended), [id number] - Ultrasound, [id number] - Physical performance training, and [id number] - Aquatic therapy     If treatment provided at initial evaluation, no treatment charged due to lack of authorization.      Selinda Michaels III PT, DPT 12/04/21 6:27 PM

## 2021-12-04 ENCOUNTER — Encounter (HOSPITAL_BASED_OUTPATIENT_CLINIC_OR_DEPARTMENT_OTHER): Payer: Medicaid Other | Admitting: Physical Therapy

## 2021-12-05 ENCOUNTER — Encounter: Payer: Self-pay | Admitting: Pharmacist

## 2021-12-05 ENCOUNTER — Ambulatory Visit (INDEPENDENT_AMBULATORY_CARE_PROVIDER_SITE_OTHER): Payer: Medicaid Other | Admitting: Pharmacist

## 2021-12-05 ENCOUNTER — Telehealth: Payer: Self-pay | Admitting: Pharmacist

## 2021-12-05 DIAGNOSIS — I2510 Atherosclerotic heart disease of native coronary artery without angina pectoris: Secondary | ICD-10-CM

## 2021-12-05 DIAGNOSIS — I350 Nonrheumatic aortic (valve) stenosis: Secondary | ICD-10-CM

## 2021-12-05 DIAGNOSIS — E785 Hyperlipidemia, unspecified: Secondary | ICD-10-CM | POA: Diagnosis not present

## 2021-12-05 MED ORDER — REPATHA SURECLICK 140 MG/ML ~~LOC~~ SOAJ: 1.0000 mL | SUBCUTANEOUS | 3 refills | Status: DC

## 2021-12-05 NOTE — Telephone Encounter (Signed)
PA for [place] submitted. Key: [id number].  PA approved through 12/06/22

## 2021-12-05 NOTE — Progress Notes (Signed)
Patient ID: Joshua [name]                 DOB: [birth year]                    MRN: [id number]     HPI: Joshua [name] is a 61 y.o. male patient referred to lipid clinic by Dr Sallyanne Kuster. PMH is significant for CAD, CHF, HTN, and aortic valve stenosis. Previously on daily atorvastatin until it caused elevated LFTs.  Patient presents today in good spirits. Wearing sling on right arm, status post rotator cuff surgery on 11/28/21.  Currently taking atorvastatin 41m twice weekly.  In 2020, ALT increased to 211 and AST increased to 70. When atorvastatin was d/c, LFTS began to drop. Now normal except slightly elevated Alk Phos.   Current Medications:  Atorvastatin 174mtwice weekly  Risk Factors:  HLD CAD CHF  LDL goal: <70  Labs: TC 114, Trigs 167, HDL 34, LDL 52 (09/10/21 on atorvastatin 1071m   LDL 99 when off atorvastatin  Past Medical History:  Diagnosis Date   Aortic stenosis    CAD (coronary artery disease), native coronary artery 12/26/2016   DES LAD & RCA, EF 25-30%   CHF (congestive heart failure) (HCC)    Dyslipidemia    Hypertension    Obesity    Osteoarthritis    Pre-diabetes    a. prior A1C 5.6, states he was on meds for this at one point.    Current Outpatient Medications on File Prior to Visit  Medication Sig Dispense Refill   amLODipine (NORVASC) 5 MG tablet TAKE 1 TABLET (5 MG TOTAL) BY MOUTH DAILY. 90 tablet 1   aspirin EC 325 MG tablet TAKE 1 TABLET BY MOUTH EVERY DAY (Patient taking differently: Take 325 mg by mouth daily as needed for moderate pain.) 30 tablet 0   atorvastatin (LIPITOR) 10 MG tablet TAKE 1 TABLET BY MOUTH EVERY MON/WED/FRID (Patient taking differently: TAKE 1 TABLET BY MOUTH EVERY MON AND FRI) 36 tablet 1   carvedilol (COREG) 3.125 MG tablet Take 1 tablet (3.125 mg total) by mouth 2 (two) times daily. 180 tablet 2   fluticasone (FLONASE) 50 MCG/ACT nasal spray PLACE 1 SPRAY INTO BOTH NOSTRILS DAILY AS NEEDED FOR ALLERGIES OR  RHINITIS. 16 mL 0   ibuprofen (ADVIL) 800 MG tablet Take 800 mg by mouth every 8 (eight) hours as needed for moderate pain.     lisinopril (ZESTRIL) 10 MG tablet TAKE 1 TABLET BY MOUTH TWICE A DAY 180 tablet 0   loratadine (CLARITIN) 10 MG tablet Take 10 mg by mouth daily.     methocarbamol (ROBAXIN) 500 MG tablet Take 1 tablet (500 mg total) by mouth 2 (two) times daily as needed for muscle spasms. 20 tablet 2   Multiple Vitamins-Minerals (PRESERVISION AREDS 2) CAPS Take 1 capsule by mouth 2 (two) times daily.     oxyCODONE (ROXICODONE) 5 MG immediate release tablet Take 1 tablet (5 mg total) by mouth every 4 (four) hours as needed for severe pain. 30 tablet 0   potassium chloride (KLOR-CON) 10 MEQ tablet TAKE 1 TABLET (10 MEQ TOTAL) BY MOUTH DAILY. DX: HYPOKALEMIA 90 tablet 1   No current facility-administered medications on file prior to visit.    No Known Allergies  Assessment/Plan:  1. Hyperlipidemia - Patient LDL 52 while on atorvastatin 26m48mich is at goal however patient experiences elevated LFTs while on statins.  LDL 99 while off atorvastatin which is above  goal of <70.  Will have patient d/c atorvastatin at this time and start [place].  Using demo pen, educated patient on storage, site selection, and administration. Patient was able to demonstrate use of pen in room.  Will complete PA and contact patient when approved.  Will update lipid panel in 2-3 months.  Start Repatha 169m sq q 14 days Check lipid panel in 2-3 months  CKarren Cobble PharmD, BPreston CGraton CWeyerha[place]user[place]te[place] SP[place]leton[place] [address]Phone: [phone] Fax: [phone]

## 2021-12-05 NOTE — Patient Instructions (Addendum)
It was nice meeting you today  We would like your LDL (bad cholesterol) to be less than 70  You can discontinue your atorvastatin at this time  We will start a new medication called Repatha which you will inject once every 2 weeks  I will complete the prior authorization for you and contact you when it is approved  Once you start the medication we will recheck your cholesterol in 2-3 months  Please let us know if you have any questions

## 2021-12-05 NOTE — Addendum Note (Signed)
Addended by: Marijo Sanes on: 12/05/2021 09:05 AM   Modules accepted: Orders

## 2021-12-11 ENCOUNTER — Other Ambulatory Visit: Payer: Self-pay | Admitting: Internal Medicine

## 2021-12-11 ENCOUNTER — Encounter (HOSPITAL_BASED_OUTPATIENT_CLINIC_OR_DEPARTMENT_OTHER): Payer: Self-pay | Admitting: Physical Therapy

## 2021-12-11 ENCOUNTER — Ambulatory Visit (HOSPITAL_BASED_OUTPATIENT_CLINIC_OR_DEPARTMENT_OTHER): Payer: Medicaid Other | Admitting: Physical Therapy

## 2021-12-11 DIAGNOSIS — M25511 Pain in right shoulder: Secondary | ICD-10-CM

## 2021-12-11 DIAGNOSIS — M25611 Stiffness of right shoulder, not elsewhere classified: Secondary | ICD-10-CM

## 2021-12-11 DIAGNOSIS — M6281 Muscle weakness (generalized): Secondary | ICD-10-CM

## 2021-12-11 DIAGNOSIS — R0981 Nasal congestion: Secondary | ICD-10-CM

## 2021-12-11 NOTE — Therapy (Signed)
OUTPATIENT PHYSICAL THERAPY TREATMENT NOTE   Patient Name: Joshua [name] MRN: [id number] DOB:1961/02/21, 61 y.o., male Today's Date: 12/11/2021   PT End of Session - 12/11/21 1106     Visit Number 2    Number of Visits 26    Date for PT Re-Evaluation 02/25/22    Authorization Type [employer] MCD    PT Start Time 0940   Pt arrived late to Rx   PT Stop Time 1015    PT Time Calculation (min) 35 min    Activity Tolerance Patient tolerated treatment well    Behavior During Therapy [facility] for tasks assessed/performed              Past Medical History:  Diagnosis Date   Aortic stenosis    CAD (coronary artery disease), native coronary artery 12/26/2016   DES LAD & RCA, EF 25-30%   CHF (congestive heart failure) (HCC)    Dyslipidemia    Hypertension    Obesity    Osteoarthritis    Pre-diabetes    a. prior A1C 5.6, states he was on meds for this at one point.   Past Surgical History:  Procedure Laterality Date   CORONARY STENT INTERVENTION N/A 12/29/2016   Procedure: CORONARY STENT INTERVENTION;  Surgeon: Jettie Booze, MD;  Location: [facility];  Service: Cardiovascular;  Laterality: N/A;   RIGHT/LEFT HEART CATH AND CORONARY ANGIOGRAPHY N/A 12/29/2016   Procedure: RIGHT/LEFT HEART CATH AND CORONARY ANGIOGRAPHY;  Surgeon: Jettie Booze, MD;  Location: [facility];  Service: Cardiovascular;  Laterality: N/A;   SHOULDER ARTHROSCOPY WITH ROTATOR CUFF REPAIR AND OPEN BICEPS TENODESIS Right 11/28/2021   Procedure: RIGHT SHOULDER ARTHROSCOPY WITH ROTATOR CUFF REPAIR AND OPEN BICEPS TENODESIS;  Surgeon: Vanetta Mulders, MD;  Location: [facility];  Service: Orthopedics;  Laterality: Right;   TONSILLECTOMY     TOTAL HIP ARTHROPLASTY Right 04/26/2018   Procedure: RIGHT TOTAL HIP ARTHROPLASTY ANTERIOR APPROACH;  Surgeon: Leandrew Koyanagi, MD;  Location: [facility];  Service: Orthopedics;  Laterality: Right;   TOTAL HIP ARTHROPLASTY Left 11/15/2018   TOTAL HIP ARTHROPLASTY Left  11/15/2018   Procedure: LEFT TOTAL HIP ARTHROPLASTY ANTERIOR APPROACH;  Surgeon: Leandrew Koyanagi, MD;  Location: [facility];  Service: Orthopedics;  Laterality: Left;   Patient Active Problem List   Diagnosis Date Noted   Biceps tendinitis of right upper extremity    Rotator cuff syndrome of right shoulder 10/16/2021   Other insomnia 04/30/2021   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   COVID-19 vaccine regimen to maintain immunity completed 04/27/2020   Statin intolerance 12/10/2018   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/15/2018   Chronic left shoulder pain 06/08/2018   Status post total hip replacement, right 04/26/2018   Mild dilation of ascending aorta ([place]) 12/26/2017   Dyslipidemia 12/26/2017   Bilateral carotid bruits 12/26/2017   Nocturnal hypoxemia 06/29/2017   Primary osteoarthritis of both hips 06/29/2017   Chronic diastolic heart failure ([place])    Coronary artery disease involving native coronary artery of native heart without angina pectoris 03/31/2017   Ischemic cardiomyopathy 03/31/2017   Marijuana use 03/31/2017   Prediabetes 03/31/2017   Aortic valve insufficiency 12/28/2016   Mitral valve regurgitation 12/28/2016   Obesity 12/28/2016   Aortic valve stenosis, nonrheumatic    Essential hypertension     REFERRING PROVIDER: Vanetta Mulders, MD  REFERRING DIAG: M75.101 (ICD-10-CM) - Rotator cuff syndrome of right shoulder   THERAPY DIAG:  Right shoulder pain, unspecified  chronicity  Stiffness of right shoulder, not elsewhere classified  Muscle weakness (generalized)  Rationale for Evaluation and Treatment Rehabilitation  ONSET DATE: DOS 11/29/2019  SUBJECTIVE:                                                                                                                                                                                      SUBJECTIVE STATEMENT: Pt is 1 weeks and 6 days s/p Arthroscopic supraspinatus and subscapularis repairs, biceps  tenodesis, debridement of labrum, and subacromial decompression.  Pt is sling dependent and is limited with all of his ADLs/IADLs and self care activities.  Pt is unable to perform overhead and reaching activities with R UE.   Pt states his L shoulder is hurting.  "That's my bad shoulder".  Pt denies any adverse effects after prior Rx.  Pt lost the red ball that was attached to the sling.  He has been using a tennis ball to perform grip squeezes.   PERTINENT HISTORY: -supraspinatus and subscapularis repairs, biceps tenodesis, debridement of labrum, and subacromial decompression on 11/28/21 -L shoulder pain, CHF with coronary Stent placement, R and L THA in 2020, OA   PAIN:  Are you having pain? Yes NPRS:  Current:  1-2/10, Best:  3/10, Worst:  9/10 Location:  R shoulder NPRS:  5/10 Location: L shoulder   PRECAUTIONS: Other: per surgical protocol, PROM  WEIGHT BEARING RESTRICTIONS Yes R UE  FALLS:  Has patient fallen in last 6 months? 1 fall, this injury  LIVING ENVIRONMENT: Lives with: lives alone   OCCUPATION: He is retired and on disability.   PLOF: Independent; Pt was able to perform his ADLs and IADLs and reaching activities independently.  PATIENT GOALS Reach top shelf, reach behind head.  OBJECTIVE:   DIAGNOSTIC FINDINGS:  Pt had x rays and MRI prior to surgery.   TODAY'S TREATMENT:   -PATIENT SURVEYS:  UEFI:  Will give next visit   -OBSERVATION: Pt wearing DonJoy sling with abd pillow.  -PT changed dressings.  PT removed gauze and tegaderm and applied new gauze and tegaderm over incisions/portals.  Incision/portals were intact with stitches.  They were dry and had no drainage.      -Pt received R shoulder PROM per pt and tissue tolerance w/n protocol ranges in flexion and abduction.   Flexion PROM:  122 deg   -Pt performed:  Wrist flex/extension AROM with arm in sling on chair arm 3x10 reps  Towel gripping with arm in sling on chair arm 3x10 reps  -PT  gave pt a HEP handout and educated pt in correct form and appropriate frequency.  Instructed pt to have arm supported in sling  with HEP.     PATIENT EDUCATION: Education details: post op protocol and restrictions, sling positioning, sling compliance, objective findings, POC, dx, and relevant anatomy.  Educated pt in the importance of being compliant with wearing sling and not using R UE.   Person educated: Patient Education method: Customer service manager Education comprehension: verbalized understanding, returned demonstration, and needs further education   HOME EXERCISE PROGRAM: Access Code: [email or web address] URL: [email or web address] Date: 12/11/2021 Prepared by: Ronny Flurry  Exercises - Wrist AROM Flexion Extension  - 3 x daily - 7 x weekly - 2-3 sets - 10 reps - Seated Gripping Towel  - 2 x daily - 7 x weekly - 2-3 sets - 10 reps  ASSESSMENT:  CLINICAL IMPRESSION: Pt presents to Rx reporting that his L shoulder is bothering him more than his R (operative) shoulder.  He has a hx of L shoulder pain.  PT performed PROM per protocol and pt had good tolerance with shoulder PROM.  Pt demonstrates good flexion PROM at this time in protocol.  PT gave pt a HEP handout consisting of wrist and hand exercises and was instructed to perform in sling.  Pt required instruction for correct form and was able to perform correctly with repetition and instruction and cuing.  PT changed dressings today and incisions/portals looked good having no signs of infection.  Pt did not bring his glasses and he was unable to fill out UEFI.  Pt will complete UEFI next visit.  Pt responded well to Rx having no c/o's after Rx.   Pt should benefit from skilled PT services per protocol to address impairments and to assist in restoring desired level of function.     OBJECTIVE IMPAIRMENTS decreased activity tolerance, decreased endurance, decreased ROM, decreased strength, hypomobility, impaired flexibility,  impaired UE functional use, and pain.   ACTIVITY LIMITATIONS carrying, lifting, sleeping, bathing, dressing, reach over head, and hygiene/grooming  PARTICIPATION LIMITATIONS: meal prep, cleaning, laundry, driving, shopping, and community activity  PERSONAL FACTORS 1-2 comorbidities: L shoulder pain and OA  are also affecting patient's functional outcome.   REHAB POTENTIAL: Good  CLINICAL DECISION MAKING: Stable/uncomplicated  EVALUATION COMPLEXITY: Low   GOALS:   SHORT TERM GOALS: Target date: 12/31/2021  Pt will be independent and compliant with HEP for improved pain, ROM, and strength.  Baseline: Goal status: INITIAL Target date: 12/31/2021    2.  Pt will tolerate PROM per protocol without adverse effects for improved ROM.  Baseline:  Goal status: INITIAL Target date:  12/24/2021  3.  Pt will demo PROM to 110-120 deg in flexion and 0 deg in ER for improved stiffness and mobility.  Baseline:  Goal status: INITIAL  4.  Pt will tolerate AAROM without adverse effects for progression of protocol.  Baseline:  Goal status: INITIAL  5. Pt will demo supine shoulder AAROM to be 120 deg in flexion and in 40 deg in ER for progression of AAROM and protocol Baseline:  Goal status: INITIAL Target date:  01/21/2022   6.   Pt will wean out of sling per MD allowance without adverse effects Baseline:  Goal status: INITIAL Target date:  01/14/2022  7.  Pt will demo R shoulder flexion and scaption AROM to at least 100 deg in standing without significant shoulder hike for improved UE elevation. Baseline:  Goal status: INITIAL Target date:  02/18/2022  8.  Pt will be able to perform his self care activities with no > than minimal difficulty.   Baseline:  Goal status: INITIAL  Target date: 02/25/2022       LONG TERM GOALS:   Pt will demo R shoulder AROM to be [facility] t/o for performance of ADLs and IADLs.  Baseline:  Goal status: INITIAL Target date:  03/25/2022   2.  Pt will  be able to perform his ADLs and IADLs without significant difficulty and pain. Baseline:  Goal status: INITIAL Target date:  03/25/2022  3.  Pt will be able to perform his normal reaching and overhead activities without significant pain and limitation. Baseline:  Goal status: INITIAL Target date:  03/25/2022  4.  Pt will demo at least 4/5 MMT strength t/o R shoulder for improved tolerance with and performance of daily activities, household chores, and functional lifting.  Baseline:  Goal status: INITIAL Target Date:  03/25/2022  5.  Pt will be independent with advanced HEP for improved shoulder strength and stability to assist with returning to desired level of function and performing functional carrying and lifting. Baseline:  Goal status: INITIAL    PLAN: PT FREQUENCY:  1x/wk x 4 weeks, 1-2x/wk x 2 weeks, and 2x/wk x 10 weeks  PT DURATION: other: 16 weeks  PLANNED INTERVENTIONS: Therapeutic exercises, Therapeutic activity, Neuromuscular re-education, Patient/Family education, Self Care, Joint mobilization, Aquatic Therapy, Dry Needling, Electrical stimulation, Spinal mobilization, Cryotherapy, Moist heat, scar mobilization, Taping, Ultrasound, Manual therapy, and Re-evaluation  PLAN FOR NEXT SESSION:  Cont with shoulder PROM per Dr. Eddie Dibbles protocol.  Pt had a subscapularis repair also, will use subscapularis repair protocol also.  Will give UEFI next visit.      Selinda Michaels III PT, DPT 12/11/21 10:21 PM

## 2021-12-12 MED ORDER — FLUTICASONE PROPIONATE 50 MCG/ACT NA SUSP: 1.0000 | Freq: Every day | NASAL | 1 refills | Status: DC | PRN

## 2021-12-13 ENCOUNTER — Other Ambulatory Visit (HOSPITAL_BASED_OUTPATIENT_CLINIC_OR_DEPARTMENT_OTHER): Payer: Self-pay | Admitting: Orthopaedic Surgery

## 2021-12-13 ENCOUNTER — Ambulatory Visit (HOSPITAL_BASED_OUTPATIENT_CLINIC_OR_DEPARTMENT_OTHER): Payer: Medicaid Other | Attending: Orthopaedic Surgery | Admitting: Physical Therapy

## 2021-12-13 ENCOUNTER — Encounter (HOSPITAL_BASED_OUTPATIENT_CLINIC_OR_DEPARTMENT_OTHER): Payer: Medicaid Other | Admitting: Orthopaedic Surgery

## 2021-12-13 ENCOUNTER — Encounter (HOSPITAL_BASED_OUTPATIENT_CLINIC_OR_DEPARTMENT_OTHER): Payer: Self-pay | Admitting: Physical Therapy

## 2021-12-13 DIAGNOSIS — M25511 Pain in right shoulder: Secondary | ICD-10-CM | POA: Insufficient documentation

## 2021-12-13 DIAGNOSIS — M6281 Muscle weakness (generalized): Secondary | ICD-10-CM | POA: Insufficient documentation

## 2021-12-13 DIAGNOSIS — M25611 Stiffness of right shoulder, not elsewhere classified: Secondary | ICD-10-CM | POA: Insufficient documentation

## 2021-12-13 NOTE — Therapy (Signed)
OUTPATIENT PHYSICAL THERAPY TREATMENT NOTE   Patient Name: Joshua [name] MRN: [id number] DOB:11/02/1960, 61 y.o., male Today's Date: 12/13/2021   PT End of Session - 12/13/21 0939     Visit Number 3    Number of Visits 26    Date for PT Re-Evaluation 02/25/22    Authorization Type [employer] MCD    PT Start Time 0940   arrives late   PT Stop Time 1010    PT Time Calculation (min) 30 min    Activity Tolerance Patient tolerated treatment well    Behavior During Therapy [facility] for tasks assessed/performed               Past Medical History:  Diagnosis Date   Aortic stenosis    CAD (coronary artery disease), native coronary artery 12/26/2016   DES LAD & RCA, EF 25-30%   CHF (congestive heart failure) (HCC)    Dyslipidemia    Hypertension    Obesity    Osteoarthritis    Pre-diabetes    a. prior A1C 5.6, states he was on meds for this at one point.   Past Surgical History:  Procedure Laterality Date   CORONARY STENT INTERVENTION N/A 12/29/2016   Procedure: CORONARY STENT INTERVENTION;  Surgeon: Jettie Booze, MD;  Location: [place] [facility];  Service: Cardiovascular;  Laterality: N/A;   RIGHT/LEFT HEART CATH AND CORONARY ANGIOGRAPHY N/A 12/29/2016   Procedure: RIGHT/LEFT HEART CATH AND CORONARY ANGIOGRAPHY;  Surgeon: Jettie Booze, MD;  Location: [place] [facility];  Service: Cardiovascular;  Laterality: N/A;   SHOULDER ARTHROSCOPY WITH ROTATOR CUFF REPAIR AND OPEN BICEPS TENODESIS Right 11/28/2021   Procedure: RIGHT SHOULDER ARTHROSCOPY WITH ROTATOR CUFF REPAIR AND OPEN BICEPS TENODESIS;  Surgeon: Vanetta Mulders, MD;  Location: [place];  Service: Orthopedics;  Laterality: Right;   TONSILLECTOMY     TOTAL HIP ARTHROPLASTY Right 04/26/2018   Procedure: RIGHT TOTAL HIP ARTHROPLASTY ANTERIOR APPROACH;  Surgeon: Leandrew Koyanagi, MD;  Location: [place];  Service: Orthopedics;  Laterality: Right;   TOTAL HIP ARTHROPLASTY Left 11/15/2018   TOTAL HIP ARTHROPLASTY Left 11/15/2018    Procedure: LEFT TOTAL HIP ARTHROPLASTY ANTERIOR APPROACH;  Surgeon: Leandrew Koyanagi, MD;  Location: [place];  Service: Orthopedics;  Laterality: Left;   Patient Active Problem List   Diagnosis Date Noted   Biceps tendinitis of right upper extremity    Rotator cuff syndrome of right shoulder 10/16/2021   Other insomnia 04/30/2021   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   COVID-19 vaccine regimen to maintain immunity completed 04/27/2020   Statin intolerance 12/10/2018   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/15/2018   Chronic left shoulder pain 06/08/2018   Status post total hip replacement, right 04/26/2018   Mild dilation of ascending aorta ([place]) 12/26/2017   Dyslipidemia 12/26/2017   Bilateral carotid bruits 12/26/2017   Nocturnal hypoxemia 06/29/2017   Primary osteoarthritis of both hips 06/29/2017   Chronic diastolic heart failure ([place])    Coronary artery disease involving native coronary artery of native heart without angina pectoris 03/31/2017   Ischemic cardiomyopathy 03/31/2017   Marijuana use 03/31/2017   Prediabetes 03/31/2017   Aortic valve insufficiency 12/28/2016   Mitral valve regurgitation 12/28/2016   Obesity 12/28/2016   Aortic valve stenosis, nonrheumatic    Essential hypertension     REFERRING PROVIDER: Vanetta Mulders, MD  REFERRING DIAG: M75.101 (ICD-10-CM) - Rotator cuff syndrome of right shoulder   THERAPY DIAG:  Right shoulder pain, unspecified chronicity  Stiffness of right shoulder, not elsewhere classified  Muscle weakness (generalized)  Rationale for Evaluation and Treatment Rehabilitation  ONSET DATE: DOS 11/29/2019  SUBJECTIVE:                                                                                                                                                                                      SUBJECTIVE STATEMENT: Pt is 2 weeks and 1 day(s) s/p Arthroscopic supraspinatus and subscapularis repairs, biceps tenodesis,  debridement of labrum, and subacromial decompression.   Pt states he has had very little pain since last session. Pt is doing well today  PERTINENT HISTORY: -supraspinatus and subscapularis repairs, biceps tenodesis, debridement of labrum, and subacromial decompression on 11/28/21 -L shoulder pain, CHF with coronary Stent placement, R and L THA in 2020, OA   PAIN:  Are you having pain? No NPRS:  Current:  0/10, Best:  3/10, Worst:  9/10 Location:  R shoulder NPRS:  5/10 Location: L shoulder   PRECAUTIONS: Other: per surgical protocol, PROM  WEIGHT BEARING RESTRICTIONS Yes R UE  FALLS:  Has patient fallen in last 6 months? 1 fall, this injury  LIVING ENVIRONMENT: Lives with: lives alone   OCCUPATION: He is retired and on disability.   PLOF: Independent; Pt was able to perform his ADLs and IADLs and reaching activities independently.  PATIENT GOALS Reach top shelf, reach behind head.  OBJECTIVE:   DIAGNOSTIC FINDINGS:  Pt had x rays and MRI prior to surgery.   TODAY'S TREATMENT:   -PATIENT SURVEYS:  UEFI Score = 25/80   -OBSERVATION: Pt wearing [employer] sling with abd pillow.  -Pt received R shoulder PROM per pt and tissue tolerance w/n protocol ranges in flexion and abduction.   Flexion PROM:  130 deg ADB PROM: 65   -Pt performed:  AP and ML pendulum 20x  Scap squeeze 20x  Wrist flex/extension AROM with arm in sling on chair arm 3x10 reps  Towel gripping with arm in sling on chair arm 3x10 reps  -PT gave pt a HEP handout and educated pt in correct form and appropriate frequency.  Instructed pt to have arm supported in sling with HEP.     PATIENT EDUCATION: Education details: post op protocol and restrictions, sling positioning, sling compliance, objective findings, POC, dx, and relevant anatomy.  Educated pt in the importance of being compliant with wearing sling and not using R UE.   Person educated: Patient Education method: Holiday representative Education comprehension: verbalized understanding, returned demonstration, and needs further education   HOME EXERCISE PROGRAM: Access Code: [email or web address] URL: [email or web address] Date: 12/11/2021 Prepared by: Ronny Flurry  ASSESSMENT:  CLINICAL IMPRESSION: Pt tolerated session  very well today with no irritation or complaints during PROM or intro to new exercise. Pt is now 2 wks at this time. Pt able to demo good technique with pendulums and scapular retractions. Pt does have guarding response with PROM but there is no joint stiffness noted. Pt likely able to exceed PROM limitations within the next few sessions. Pt progressing well. Plan to change dressings at next session and continue with PROM per protocol. No PROM ER per subscap repair.  Pt should benefit from skilled PT services per protocol to address impairments and to assist in restoring desired level of function.     OBJECTIVE IMPAIRMENTS decreased activity tolerance, decreased endurance, decreased ROM, decreased strength, hypomobility, impaired flexibility, impaired UE functional use, and pain.   ACTIVITY LIMITATIONS carrying, lifting, sleeping, bathing, dressing, reach over head, and hygiene/grooming  PARTICIPATION LIMITATIONS: meal prep, cleaning, laundry, driving, shopping, and community activity  PERSONAL FACTORS 1-2 comorbidities: L shoulder pain and OA  are also affecting patient's functional outcome.   REHAB POTENTIAL: Good  CLINICAL DECISION MAKING: Stable/uncomplicated  EVALUATION COMPLEXITY: Low   GOALS:   SHORT TERM GOALS: Target date: 12/31/2021  Pt will be independent and compliant with HEP for improved pain, ROM, and strength.  Baseline: Goal status: INITIAL Target date: 12/31/2021    2.  Pt will tolerate PROM per protocol without adverse effects for improved ROM.  Baseline:  Goal status: INITIAL Target date:  12/24/2021  3.  Pt will demo PROM to 110-120 deg in flexion and 0  deg in ER for improved stiffness and mobility.  Baseline:  Goal status: INITIAL  4.  Pt will tolerate AAROM without adverse effects for progression of protocol.  Baseline:  Goal status: INITIAL  5. Pt will demo supine shoulder AAROM to be 120 deg in flexion and in 40 deg in ER for progression of AAROM and protocol Baseline:  Goal status: INITIAL Target date:  01/21/2022   6.   Pt will wean out of sling per MD allowance without adverse effects Baseline:  Goal status: INITIAL Target date:  01/14/2022  7.  Pt will demo R shoulder flexion and scaption AROM to at least 100 deg in standing without significant shoulder hike for improved UE elevation. Baseline:  Goal status: INITIAL Target date:  02/18/2022  8.  Pt will be able to perform his self care activities with no > than minimal difficulty.   Baseline:  Goal status: INITIAL Target date: 02/25/2022       LONG TERM GOALS:   Pt will demo R shoulder AROM to be [facility] t/o for performance of ADLs and IADLs.  Baseline:  Goal status: INITIAL Target date:  03/25/2022   2.  Pt will be able to perform his ADLs and IADLs without significant difficulty and pain. Baseline:  Goal status: INITIAL Target date:  03/25/2022  3.  Pt will be able to perform his normal reaching and overhead activities without significant pain and limitation. Baseline:  Goal status: INITIAL Target date:  03/25/2022  4.  Pt will demo at least 4/5 MMT strength t/o R shoulder for improved tolerance with and performance of daily activities, household chores, and functional lifting.  Baseline:  Goal status: INITIAL Target Date:  03/25/2022  5.  Pt will be independent with advanced HEP for improved shoulder strength and stability to assist with returning to desired level of function and performing functional carrying and lifting. Baseline:  Goal status: INITIAL    PLAN: PT FREQUENCY:  1x/wk x 4 weeks,  1-2x/wk x 2 weeks, and 2x/wk x 10 weeks  PT  DURATION: other: 16 weeks  PLANNED INTERVENTIONS: Therapeutic exercises, Therapeutic activity, Neuromuscular re-education, Patient/Family education, Self Care, Joint mobilization, Aquatic Therapy, Dry Needling, Electrical stimulation, Spinal mobilization, Cryotherapy, Moist heat, scar mobilization, Taping, Ultrasound, Manual therapy, and Re-evaluation  PLAN FOR NEXT SESSION:  Cont with shoulder PROM per Dr. Eddie Dibbles protocol.  Pt had a subscapularis repair also, will use subscapularis repair protocol also.  Will give UEFI next visit.     Daleen Bo PT, DPT 12/13/21 10:13 AM

## 2021-12-17 ENCOUNTER — Ambulatory Visit (HOSPITAL_BASED_OUTPATIENT_CLINIC_OR_DEPARTMENT_OTHER): Payer: Medicaid Other | Admitting: Physical Therapy

## 2021-12-17 ENCOUNTER — Encounter (HOSPITAL_BASED_OUTPATIENT_CLINIC_OR_DEPARTMENT_OTHER): Payer: Self-pay | Admitting: Physical Therapy

## 2021-12-17 DIAGNOSIS — M25511 Pain in right shoulder: Secondary | ICD-10-CM | POA: Diagnosis not present

## 2021-12-17 DIAGNOSIS — M6281 Muscle weakness (generalized): Secondary | ICD-10-CM

## 2021-12-17 DIAGNOSIS — M25611 Stiffness of right shoulder, not elsewhere classified: Secondary | ICD-10-CM

## 2021-12-17 NOTE — Therapy (Signed)
OUTPATIENT PHYSICAL THERAPY TREATMENT NOTE   Patient Name: Joshua [name] MRN: [id number] DOB:January 21, 1961, 61 y.o., male Today's Date: 12/17/2021   PT End of Session - 12/17/21 1055     Visit Number 4    Number of Visits 26    Date for PT Re-Evaluation 02/25/22    Authorization Type [employer] MCD    PT Start Time 1030   arrives late   PT Stop Time 1055    PT Time Calculation (min) 25 min    Activity Tolerance Patient tolerated treatment well    Behavior During Therapy [facility] for tasks assessed/performed                Past Medical History:  Diagnosis Date   Aortic stenosis    CAD (coronary artery disease), native coronary artery 12/26/2016   DES LAD & RCA, EF 25-30%   CHF (congestive heart failure) (HCC)    Dyslipidemia    Hypertension    Obesity    Osteoarthritis    Pre-diabetes    a. prior A1C 5.6, states he was on meds for this at one point.   Past Surgical History:  Procedure Laterality Date   CORONARY STENT INTERVENTION N/A 12/29/2016   Procedure: CORONARY STENT INTERVENTION;  Surgeon: Jettie Booze, MD;  Location: [place] [facility];  Service: Cardiovascular;  Laterality: N/A;   RIGHT/LEFT HEART CATH AND CORONARY ANGIOGRAPHY N/A 12/29/2016   Procedure: RIGHT/LEFT HEART CATH AND CORONARY ANGIOGRAPHY;  Surgeon: Jettie Booze, MD;  Location: [place] [facility];  Service: Cardiovascular;  Laterality: N/A;   SHOULDER ARTHROSCOPY WITH ROTATOR CUFF REPAIR AND OPEN BICEPS TENODESIS Right 11/28/2021   Procedure: RIGHT SHOULDER ARTHROSCOPY WITH ROTATOR CUFF REPAIR AND OPEN BICEPS TENODESIS;  Surgeon: Vanetta Mulders, MD;  Location: [place];  Service: Orthopedics;  Laterality: Right;   TONSILLECTOMY     TOTAL HIP ARTHROPLASTY Right 04/26/2018   Procedure: RIGHT TOTAL HIP ARTHROPLASTY ANTERIOR APPROACH;  Surgeon: Leandrew Koyanagi, MD;  Location: [place];  Service: Orthopedics;  Laterality: Right;   TOTAL HIP ARTHROPLASTY Left 11/15/2018   TOTAL HIP ARTHROPLASTY Left  11/15/2018   Procedure: LEFT TOTAL HIP ARTHROPLASTY ANTERIOR APPROACH;  Surgeon: Leandrew Koyanagi, MD;  Location: [place];  Service: Orthopedics;  Laterality: Left;   Patient Active Problem List   Diagnosis Date Noted   Biceps tendinitis of right upper extremity    Rotator cuff syndrome of right shoulder 10/16/2021   Other insomnia 04/30/2021   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   COVID-19 vaccine regimen to maintain immunity completed 04/27/2020   Statin intolerance 12/10/2018   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/15/2018   Chronic left shoulder pain 06/08/2018   Status post total hip replacement, right 04/26/2018   Mild dilation of ascending aorta ([place]) 12/26/2017   Dyslipidemia 12/26/2017   Bilateral carotid bruits 12/26/2017   Nocturnal hypoxemia 06/29/2017   Primary osteoarthritis of both hips 06/29/2017   Chronic diastolic heart failure ([place])    Coronary artery disease involving native coronary artery of native heart without angina pectoris 03/31/2017   Ischemic cardiomyopathy 03/31/2017   Marijuana use 03/31/2017   Prediabetes 03/31/2017   Aortic valve insufficiency 12/28/2016   Mitral valve regurgitation 12/28/2016   Obesity 12/28/2016   Aortic valve stenosis, nonrheumatic    Essential hypertension     REFERRING PROVIDER: Vanetta Mulders, MD  REFERRING DIAG: M75.101 (ICD-10-CM) - Rotator cuff syndrome of right shoulder   THERAPY DIAG:  Right shoulder pain, unspecified chronicity  Stiffness of right shoulder, not elsewhere classified  Muscle weakness (generalized)  Rationale for Evaluation and Treatment Rehabilitation  ONSET DATE: DOS 11/29/2019  SUBJECTIVE:                                                                                                                                                                                      SUBJECTIVE STATEMENT: Pt is 2 weeks and 4 day(s) s/p Arthroscopic supraspinatus and subscapularis repairs, biceps  tenodesis, debridement of labrum, and subacromial decompression.   Pt states he has had very little pain since last session. Pt has been using the sling as directed.  PERTINENT HISTORY: -supraspinatus and subscapularis repairs, biceps tenodesis, debridement of labrum, and subacromial decompression on 11/28/21 -L shoulder pain, CHF with coronary Stent placement, R and L THA in 2020, OA   PAIN:  Are you having pain? No NPRS:  Current:  0/10, Best:  3/10, Worst:  9/10 Location:  R shoulder NPRS:  5/10 Location: L shoulder   PRECAUTIONS: Other: per surgical protocol, PROM  WEIGHT BEARING RESTRICTIONS Yes R UE  FALLS:  Has patient fallen in last 6 months? 1 fall, this injury  LIVING ENVIRONMENT: Lives with: lives alone   OCCUPATION: He is retired and on disability.   PLOF: Independent; Pt was able to perform his ADLs and IADLs and reaching activities independently.  PATIENT GOALS Reach top shelf, reach behind head.  OBJECTIVE:   DIAGNOSTIC FINDINGS:  Pt had x rays and MRI prior to surgery.   TODAY'S TREATMENT:   -PATIENT SURVEYS:  UEFI Score = 25/80   -OBSERVATION:  Bandages changed with fresh gauze and tegaderm  -Pt received R shoulder PROM per pt and tissue tolerance w/n protocol ranges in flexion and abduction.   Flexion PROM:  130 deg ADB PROM: 70   -Pt performed:  AP and ML pendulum 20x    PATIENT EDUCATION: Education details: post op protocol and restrictions, sling positioning, sling compliance, objective findings, POC, dx, and relevant anatomy.  Educated pt in the importance of being compliant with wearing sling and not using R UE.   Person educated: Patient Education method: Customer service manager Education comprehension: verbalized understanding, returned demonstration, and needs further education   HOME EXERCISE PROGRAM: Access Code: [email or web address] URL: [email or web address] Date: 12/11/2021 Prepared by: Ronny Flurry  ASSESSMENT:  CLINICAL IMPRESSION: Pt session limited by late arrival. Pt able to continue with PROM progression without pain. Plan to continue per protocol. Pt to see MD in 2 days for 2 week follow up and progression of motion. No PROM ER per subscap repair.  Pt should benefit from skilled PT services per protocol to address impairments and  to assist in restoring desired level of function.     OBJECTIVE IMPAIRMENTS decreased activity tolerance, decreased endurance, decreased ROM, decreased strength, hypomobility, impaired flexibility, impaired UE functional use, and pain.   ACTIVITY LIMITATIONS carrying, lifting, sleeping, bathing, dressing, reach over head, and hygiene/grooming  PARTICIPATION LIMITATIONS: meal prep, cleaning, laundry, driving, shopping, and community activity  PERSONAL FACTORS 1-2 comorbidities: L shoulder pain and OA  are also affecting patient's functional outcome.   REHAB POTENTIAL: Good  CLINICAL DECISION MAKING: Stable/uncomplicated  EVALUATION COMPLEXITY: Low   GOALS:   SHORT TERM GOALS: Target date: 12/31/2021  Pt will be independent and compliant with HEP for improved pain, ROM, and strength.  Baseline: Goal status: INITIAL Target date: 12/31/2021    2.  Pt will tolerate PROM per protocol without adverse effects for improved ROM.  Baseline:  Goal status: INITIAL Target date:  12/24/2021  3.  Pt will demo PROM to 110-120 deg in flexion and 0 deg in ER for improved stiffness and mobility.  Baseline:  Goal status: INITIAL  4.  Pt will tolerate AAROM without adverse effects for progression of protocol.  Baseline:  Goal status: INITIAL  5. Pt will demo supine shoulder AAROM to be 120 deg in flexion and in 40 deg in ER for progression of AAROM and protocol Baseline:  Goal status: INITIAL Target date:  01/21/2022   6.   Pt will wean out of sling per MD allowance without adverse effects Baseline:  Goal status: INITIAL Target date:   01/14/2022  7.  Pt will demo R shoulder flexion and scaption AROM to at least 100 deg in standing without significant shoulder hike for improved UE elevation. Baseline:  Goal status: INITIAL Target date:  02/18/2022  8.  Pt will be able to perform his self care activities with no > than minimal difficulty.   Baseline:  Goal status: INITIAL Target date: 02/25/2022       LONG TERM GOALS:   Pt will demo R shoulder AROM to be [facility] t/o for performance of ADLs and IADLs.  Baseline:  Goal status: INITIAL Target date:  03/25/2022   2.  Pt will be able to perform his ADLs and IADLs without significant difficulty and pain. Baseline:  Goal status: INITIAL Target date:  03/25/2022  3.  Pt will be able to perform his normal reaching and overhead activities without significant pain and limitation. Baseline:  Goal status: INITIAL Target date:  03/25/2022  4.  Pt will demo at least 4/5 MMT strength t/o R shoulder for improved tolerance with and performance of daily activities, household chores, and functional lifting.  Baseline:  Goal status: INITIAL Target Date:  03/25/2022  5.  Pt will be independent with advanced HEP for improved shoulder strength and stability to assist with returning to desired level of function and performing functional carrying and lifting. Baseline:  Goal status: INITIAL    PLAN: PT FREQUENCY:  1x/wk x 4 weeks, 1-2x/wk x 2 weeks, and 2x/wk x 10 weeks  PT DURATION: other: 16 weeks  PLANNED INTERVENTIONS: Therapeutic exercises, Therapeutic activity, Neuromuscular re-education, Patient/Family education, Self Care, Joint mobilization, Aquatic Therapy, Dry Needling, Electrical stimulation, Spinal mobilization, Cryotherapy, Moist heat, scar mobilization, Taping, Ultrasound, Manual therapy, and Re-evaluation  PLAN FOR NEXT SESSION:  Cont with shoulder PROM per Dr. Eddie Dibbles protocol.  Pt had a subscapularis repair also, will use subscapularis repair protocol also.   Will give UEFI next visit.     Daleen Bo PT, DPT 12/17/21 11:00 AM

## 2021-12-19 ENCOUNTER — Ambulatory Visit (INDEPENDENT_AMBULATORY_CARE_PROVIDER_SITE_OTHER): Payer: Medicaid Other | Admitting: Orthopaedic Surgery

## 2021-12-19 ENCOUNTER — Encounter (HOSPITAL_BASED_OUTPATIENT_CLINIC_OR_DEPARTMENT_OTHER): Payer: Self-pay | Admitting: Physical Therapy

## 2021-12-19 ENCOUNTER — Ambulatory Visit (HOSPITAL_BASED_OUTPATIENT_CLINIC_OR_DEPARTMENT_OTHER): Payer: Medicaid Other | Admitting: Physical Therapy

## 2021-12-19 DIAGNOSIS — M25511 Pain in right shoulder: Secondary | ICD-10-CM

## 2021-12-19 DIAGNOSIS — M6281 Muscle weakness (generalized): Secondary | ICD-10-CM

## 2021-12-19 DIAGNOSIS — M25611 Stiffness of right shoulder, not elsewhere classified: Secondary | ICD-10-CM

## 2021-12-19 DIAGNOSIS — M75101 Unspecified rotator cuff tear or rupture of right shoulder, not specified as traumatic: Secondary | ICD-10-CM

## 2021-12-19 NOTE — Therapy (Signed)
OUTPATIENT PHYSICAL THERAPY TREATMENT NOTE   Patient Name: Joshua [name] MRN: [id number] DOB:03/13/1961, 61 y.o., male Today's Date: 12/19/2021   PT End of Session - 12/19/21 1013     Visit Number 5    Number of Visits 26    Date for PT Re-Evaluation 02/25/22    Authorization Type [employer] MCD    PT Start Time 1015    PT Stop Time 1040    PT Time Calculation (min) 25 min    Activity Tolerance Patient tolerated treatment well    Behavior During Therapy WFL for tasks assessed/performed                Past Medical History:  Diagnosis Date   Aortic stenosis    CAD (coronary artery disease), native coronary artery 12/26/2016   DES LAD & RCA, EF 25-30%   CHF (congestive heart failure) (HCC)    Dyslipidemia    Hypertension    Obesity    Osteoarthritis    Pre-diabetes    a. prior A1C 5.6, states he was on meds for this at one point.   Past Surgical History:  Procedure Laterality Date   CORONARY STENT INTERVENTION N/A 12/29/2016   Procedure: CORONARY STENT INTERVENTION;  Surgeon: Varanasi, Jayadeep S, MD;  Location: [facility];  Service: Cardiovascular;  Laterality: N/A;   RIGHT/LEFT HEART CATH AND CORONARY ANGIOGRAPHY N/A 12/29/2016   Procedure: RIGHT/LEFT HEART CATH AND CORONARY ANGIOGRAPHY;  Surgeon: Varanasi, Jayadeep S, MD;  Location: [facility];  Service: Cardiovascular;  Laterality: N/A;   SHOULDER ARTHROSCOPY WITH ROTATOR CUFF REPAIR AND OPEN BICEPS TENODESIS Right 11/28/2021   Procedure: RIGHT SHOULDER ARTHROSCOPY WITH ROTATOR CUFF REPAIR AND OPEN BICEPS TENODESIS;  Surgeon: Bokshan, Steven, MD;  Location: [facility];  Service: Orthopedics;  Laterality: Right;   TONSILLECTOMY     TOTAL HIP ARTHROPLASTY Right 04/26/2018   Procedure: RIGHT TOTAL HIP ARTHROPLASTY ANTERIOR APPROACH;  Surgeon: Xu, Naiping M, MD;  Location: [facility];  Service: Orthopedics;  Laterality: Right;   TOTAL HIP ARTHROPLASTY Left 11/15/2018   TOTAL HIP ARTHROPLASTY Left 11/15/2018    Procedure: LEFT TOTAL HIP ARTHROPLASTY ANTERIOR APPROACH;  Surgeon: Xu, Naiping M, MD;  Location: [facility];  Service: Orthopedics;  Laterality: Left;   Patient Active Problem List   Diagnosis Date Noted   Biceps tendinitis of right upper extremity    Rotator cuff syndrome of right shoulder 10/16/2021   Other insomnia 04/30/2021   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   COVID-19 vaccine regimen to maintain immunity completed 04/27/2020   Statin intolerance 12/10/2018   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/15/2018   Chronic left shoulder pain 06/08/2018   Status post total hip replacement, right 04/26/2018   Mild dilation of ascending aorta (HCC) 12/26/2017   Dyslipidemia 12/26/2017   Bilateral carotid bruits 12/26/2017   Nocturnal hypoxemia 06/29/2017   Primary osteoarthritis of both hips 06/29/2017   Chronic diastolic heart failure (HCC)    Coronary artery disease involving native coronary artery of native heart without angina pectoris 03/31/2017   Ischemic cardiomyopathy 03/31/2017   Marijuana use 03/31/2017   Prediabetes 03/31/2017   Aortic valve insufficiency 12/28/2016   Mitral valve regurgitation 12/28/2016   Obesity 12/28/2016   Aortic valve stenosis, nonrheumatic    Essential hypertension     REFERRING PROVIDER: Bokshan, Steven, MD  REFERRING DIAG: M75.101 (ICD-10-CM) - Rotator cuff syndrome of right shoulder   THERAPY DIAG:  Right shoulder pain, unspecified chronicity  Stiffness of   right shoulder, not elsewhere classified  Muscle weakness (generalized)  Rationale for Evaluation and Treatment Rehabilitation  ONSET DATE: DOS 11/29/2019  SUBJECTIVE:                                                                                                                                                                                      SUBJECTIVE STATEMENT: Pt is 3 weeks  s/p Arthroscopic supraspinatus and subscapularis repairs, biceps tenodesis, debridement of  labrum, and subacromial decompression.   Pt states he has had no pain since last session. Pt just came from MD appt and reports no issues. He had stitches removed and states MD was happy with his progress so far.   PERTINENT HISTORY: -supraspinatus and subscapularis repairs, biceps tenodesis, debridement of labrum, and subacromial decompression on 11/28/21 -L shoulder pain, CHF with coronary Stent placement, R and L THA in 2020, OA   PAIN:  Are you having pain? No NPRS:  Current:  0/10, Best:  3/10, Worst:  9/10 Location:  R shoulder NPRS:  5/10 Location: L shoulder   PRECAUTIONS: Other: per surgical protocol, PROM  WEIGHT BEARING RESTRICTIONS Yes R UE  FALLS:  Has patient fallen in last 6 months? 1 fall, this injury  LIVING ENVIRONMENT: Lives with: lives alone   OCCUPATION: He is retired and on disability.   PLOF: Independent; Pt was able to perform his ADLs and IADLs and reaching activities independently.  PATIENT GOALS Reach top shelf, reach behind head.  OBJECTIVE:   DIAGNOSTIC FINDINGS:  Pt had x rays and MRI prior to surgery.   TODAY'S TREATMENT:  9/7 -Pt received R shoulder PROM per pt and tissue tolerance w/n protocol ranges in flexion and abduction.   Flexion PROM:  135 deg ADB PROM: 94  Joint mob: LAD grade II with oscillations R Inf GHJ glide grade II   -Pt performed: 20x scap squeeze    PATIENT EDUCATION: Education details: post op protocol and restrictions, sling positioning, sling compliance, objective findings, POC, dx, and relevant anatomy.  Educated pt in the importance of being compliant with wearing sling and not using R UE.   Person educated: Patient Education method: Customer service manager Education comprehension: verbalized understanding, returned demonstration, and needs further education   HOME EXERCISE PROGRAM: Access Code: [email or web address] URL: [email or web address] Date: 12/11/2021 Prepared by: Ronny Flurry  ASSESSMENT:  CLINICAL IMPRESSION: Pt able to progress PROM today without pain or noticeable joint stiffness to near protocol limits. Pt progressing very well. Pt is 3 wks at this time. Consider AAROM once at 4 wks. No PROM ER or active IR per subscap repair.  Pt should benefit from skilled PT services per protocol  to address impairments and to assist in restoring desired level of function.     OBJECTIVE IMPAIRMENTS decreased activity tolerance, decreased endurance, decreased ROM, decreased strength, hypomobility, impaired flexibility, impaired UE functional use, and pain.   ACTIVITY LIMITATIONS carrying, lifting, sleeping, bathing, dressing, reach over head, and hygiene/grooming  PARTICIPATION LIMITATIONS: meal prep, cleaning, laundry, driving, shopping, and community activity  PERSONAL FACTORS 1-2 comorbidities: L shoulder pain and OA  are also affecting patient's functional outcome.   REHAB POTENTIAL: Good  CLINICAL DECISION MAKING: Stable/uncomplicated  EVALUATION COMPLEXITY: Low   GOALS:   SHORT TERM GOALS: Target date: 12/31/2021  Pt will be independent and compliant with HEP for improved pain, ROM, and strength.  Baseline: Goal status: MET Target date: 12/31/2021    2.  Pt will tolerate PROM per protocol without adverse effects for improved ROM.  Baseline:  Goal status: MET Target date:  12/24/2021  3.  Pt will demo PROM to 110-120 deg in flexion and 0 deg in ER for improved stiffness and mobility.  Baseline:  Goal status: MET  4.  Pt will tolerate AAROM without adverse effects for progression of protocol.  Baseline:  Goal status: ongoing  5. Pt will demo supine shoulder AAROM to be 120 deg in flexion and in 40 deg in ER for progression of AAROM and protocol Baseline:  Goal status: ongoing Target date:  01/21/2022   6.   Pt will wean out of sling per MD allowance without adverse effects Baseline:  Goal status: ongoing Target date:  01/14/2022  7.   Pt will demo R shoulder flexion and scaption AROM to at least 100 deg in standing without significant shoulder hike for improved UE elevation. Baseline:  Goal status: ongoing Target date:  02/18/2022  8.  Pt will be able to perform his self care activities with no > than minimal difficulty.   Baseline:  Goal status: ongoing Target date: 02/25/2022       LONG TERM GOALS:   Pt will demo R shoulder AROM to be WFL t/o for performance of ADLs and IADLs.  Baseline:  Goal status: INITIAL Target date:  03/25/2022   2.  Pt will be able to perform his ADLs and IADLs without significant difficulty and pain. Baseline:  Goal status: INITIAL Target date:  03/25/2022  3.  Pt will be able to perform his normal reaching and overhead activities without significant pain and limitation. Baseline:  Goal status: INITIAL Target date:  03/25/2022  4.  Pt will demo at least 4/5 MMT strength t/o R shoulder for improved tolerance with and performance of daily activities, household chores, and functional lifting.  Baseline:  Goal status: INITIAL Target Date:  03/25/2022  5.  Pt will be independent with advanced HEP for improved shoulder strength and stability to assist with returning to desired level of function and performing functional carrying and lifting. Baseline:  Goal status: INITIAL    PLAN: PT FREQUENCY:  1x/wk x 4 weeks, 1-2x/wk x 2 weeks, and 2x/wk x 10 weeks  PT DURATION: other: 16 weeks  PLANNED INTERVENTIONS: Therapeutic exercises, Therapeutic activity, Neuromuscular re-education, Patient/Family education, Self Care, Joint mobilization, Aquatic Therapy, Dry Needling, Electrical stimulation, Spinal mobilization, Cryotherapy, Moist heat, scar mobilization, Taping, Ultrasound, Manual therapy, and Re-evaluation  PLAN FOR NEXT SESSION:  Cont with shoulder PROM per Dr. Bokshan's protocol.  Pt had a subscapularis repair also, will use subscapularis repair protocol also.    Alan Zhou PT,  DPT 12/19/21 10:47 AM 

## 2021-12-19 NOTE — Progress Notes (Signed)
Post Operative Evaluation    Procedure/Date of Surgery: Right shoulder rotator cuff repair with biceps tenodesis 11/28/21  Interval History:     Presents today 3 weeks status post the above procedure overall doing very well.  He has begun working with physical therapy and is progressing significantly.  He has been working on passive range of motion.  He does have some pain at night although this is very manageable.  He has been compliant with aspirin usage.   PMH/PSH/Family History/Social History/Meds/Allergies:    Past Medical History:  Diagnosis Date   Aortic stenosis    CAD (coronary artery disease), native coronary artery 12/26/2016   DES LAD & RCA, EF 25-30%   CHF (congestive heart failure) (HCC)    Dyslipidemia    Hypertension    Obesity    Osteoarthritis    Pre-diabetes    a. prior A1C 5.6, states he was on meds for this at one point.   Past Surgical History:  Procedure Laterality Date   CORONARY STENT INTERVENTION N/A 12/29/2016   Procedure: CORONARY STENT INTERVENTION;  Surgeon: Jettie Booze, MD;  Location: [facility];  Service: Cardiovascular;  Laterality: N/A;   RIGHT/LEFT HEART CATH AND CORONARY ANGIOGRAPHY N/A 12/29/2016   Procedure: RIGHT/LEFT HEART CATH AND CORONARY ANGIOGRAPHY;  Surgeon: Jettie Booze, MD;  Location: [facility];  Service: Cardiovascular;  Laterality: N/A;   SHOULDER ARTHROSCOPY WITH ROTATOR CUFF REPAIR AND OPEN BICEPS TENODESIS Right 11/28/2021   Procedure: RIGHT SHOULDER ARTHROSCOPY WITH ROTATOR CUFF REPAIR AND OPEN BICEPS TENODESIS;  Surgeon: Vanetta Mulders, MD;  Location: [facility];  Service: Orthopedics;  Laterality: Right;   TONSILLECTOMY     TOTAL HIP ARTHROPLASTY Right 04/26/2018   Procedure: RIGHT TOTAL HIP ARTHROPLASTY ANTERIOR APPROACH;  Surgeon: Leandrew Koyanagi, MD;  Location: [facility];  Service: Orthopedics;  Laterality: Right;   TOTAL HIP ARTHROPLASTY Left 11/15/2018   TOTAL HIP  ARTHROPLASTY Left 11/15/2018   Procedure: LEFT TOTAL HIP ARTHROPLASTY ANTERIOR APPROACH;  Surgeon: Leandrew Koyanagi, MD;  Location: [facility];  Service: Orthopedics;  Laterality: Left;   Social History   Socioeconomic History   Marital status: Single    Spouse name: Not on file   Number of children: Not on file   Years of education: Not on file   Highest education level: Not on file  Occupational History   Not on file  Tobacco Use   Smoking status: Former   Smokeless tobacco: Never   Tobacco comments:    Smoked lightly for approx 20 years, quit 1990s  Vaping Use   Vaping Use: Never used  Substance and Sexual Activity   Alcohol use: Not Currently   Drug use: Yes    Types: Marijuana    Comment: every night (to sleep)   Sexual activity: Not on file  Other Topics Concern   Not on file  Social History Narrative   Lives Home alone. Sister makes all health related decisions. Declined Advanced directive at this time   Social Determinants of Health   Financial Resource Strain: Not on file  Food Insecurity: Not on file  Transportation Needs: Not on file  Physical Activity: Not on file  Stress: Not on file  Social Connections: Not on file   Family History  Problem Relation Age of Onset  CAD Father        MI/CABG age 35   Allergies  Allergen Reactions   Statins Other (See Comments)    Elevated LFTs   Current Outpatient Medications  Medication Sig Dispense Refill   amLODipine (NORVASC) 5 MG tablet TAKE 1 TABLET (5 MG TOTAL) BY MOUTH DAILY. 90 tablet 1   aspirin EC 325 MG tablet TAKE 1 TABLET BY MOUTH EVERY DAY 30 tablet 0   carvedilol (COREG) 3.125 MG tablet Take 1 tablet (3.125 mg total) by mouth 2 (two) times daily. 180 tablet 2   Evolocumab (REPATHA SURECLICK) 440 MG/ML SOAJ Inject 1 mL into the skin every 14 (fourteen) days. 6 mL 3   fluticasone (FLONASE) 50 MCG/ACT nasal spray Place 1 spray into both nostrils daily as needed for allergies or rhinitis. 16 mL 1   ibuprofen  (ADVIL) 800 MG tablet TAKE 1 TABLET BY MOUTH EVERY 8 HOURS FOR 10 DAYS. TAKE WITH FOOD, ALTERNATE WITH ACETAMINOPHEN 30 tablet 0   lisinopril (ZESTRIL) 10 MG tablet TAKE 1 TABLET BY MOUTH TWICE A DAY 180 tablet 0   loratadine (CLARITIN) 10 MG tablet Take 10 mg by mouth daily.     methocarbamol (ROBAXIN) 500 MG tablet Take 1 tablet (500 mg total) by mouth 2 (two) times daily as needed for muscle spasms. 20 tablet 2   Multiple Vitamins-Minerals (PRESERVISION AREDS 2) CAPS Take 1 capsule by mouth 2 (two) times daily.     oxyCODONE (ROXICODONE) 5 MG immediate release tablet Take 1 tablet (5 mg total) by mouth every 4 (four) hours as needed for severe pain. 30 tablet 0   potassium chloride (KLOR-CON) 10 MEQ tablet TAKE 1 TABLET (10 MEQ TOTAL) BY MOUTH DAILY. DX: HYPOKALEMIA 90 tablet 1   No current facility-administered medications for this visit.   No results found.  Review of Systems:   A ROS was performed including pertinent positives and negatives as documented in the HPI.   Musculoskeletal Exam:    There were no vitals taken for this visit.  Right shoulder incisions are well-appearing.  No erythema or drainage.  Passively it is fine position range of motion is to 120 degrees of flexion easily.  External rotation at the side is to 25 degrees.  Internal rotation deferred today.  Remainder of distal neurosensory exam is intact  Imaging:      I personally reviewed and interpreted the radiographs.   Assessment:   3 weeks status post right shoulder rotator cuff repair and biceps tenodesis overall doing extremely well.  At this time he will continue to progress to the rehab protocol for rotator cuff repair.  I have advised him on all specific precautions and limitations.  I will plan to see him back in 4 weeks for reassessment  Plan :    -Return to clinic in 4 weeks for reassessment      I personally saw and evaluated the patient, and participated in the management and treatment  plan.  Vanetta Mulders, MD Attending Physician, Orthopedic Surgery  This document was dictated using Dragon voice recognition software. A reasonable attempt at proof reading has been made to minimize errors.

## 2021-12-22 NOTE — Therapy (Signed)
OUTPATIENT PHYSICAL THERAPY TREATMENT NOTE   Patient Name: Joshua [name] MRN: [id number] DOB:1961/03/11, 60 y.o., male Today's Date: 12/24/2021   PT End of Session - 12/23/21 1115     Visit Number 6    Number of Visits 26    Date for PT Re-Evaluation 02/25/22    Authorization Type [employer] MCD    PT Start Time 1030    PT Stop Time 1100    PT Time Calculation (min) 30 min    Activity Tolerance Patient tolerated treatment well    Behavior During Therapy [facility] for tasks assessed/performed                 Past Medical History:  Diagnosis Date   Aortic stenosis    CAD (coronary artery disease), native coronary artery 12/26/2016   DES LAD & RCA, EF 25-30%   CHF (congestive heart failure) (HCC)    Dyslipidemia    Hypertension    Obesity    Osteoarthritis    Pre-diabetes    a. prior A1C 5.6, states he was on meds for this at one point.   Past Surgical History:  Procedure Laterality Date   CORONARY STENT INTERVENTION N/A 12/29/2016   Procedure: CORONARY STENT INTERVENTION;  Surgeon: Jettie Booze, MD;  Location: [place] [facility];  Service: Cardiovascular;  Laterality: N/A;   RIGHT/LEFT HEART CATH AND CORONARY ANGIOGRAPHY N/A 12/29/2016   Procedure: RIGHT/LEFT HEART CATH AND CORONARY ANGIOGRAPHY;  Surgeon: Jettie Booze, MD;  Location: [place] [facility];  Service: Cardiovascular;  Laterality: N/A;   SHOULDER ARTHROSCOPY WITH ROTATOR CUFF REPAIR AND OPEN BICEPS TENODESIS Right 11/28/2021   Procedure: RIGHT SHOULDER ARTHROSCOPY WITH ROTATOR CUFF REPAIR AND OPEN BICEPS TENODESIS;  Surgeon: Vanetta Mulders, MD;  Location: [place];  Service: Orthopedics;  Laterality: Right;   TONSILLECTOMY     TOTAL HIP ARTHROPLASTY Right 04/26/2018   Procedure: RIGHT TOTAL HIP ARTHROPLASTY ANTERIOR APPROACH;  Surgeon: Leandrew Koyanagi, MD;  Location: [place];  Service: Orthopedics;  Laterality: Right;   TOTAL HIP ARTHROPLASTY Left 11/15/2018   TOTAL HIP ARTHROPLASTY Left 11/15/2018    Procedure: LEFT TOTAL HIP ARTHROPLASTY ANTERIOR APPROACH;  Surgeon: Leandrew Koyanagi, MD;  Location: [place];  Service: Orthopedics;  Laterality: Left;   Patient Active Problem List   Diagnosis Date Noted   Biceps tendinitis of right upper extremity    Rotator cuff syndrome of right shoulder 10/16/2021   Other insomnia 04/30/2021   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   COVID-19 vaccine regimen to maintain immunity completed 04/27/2020   Statin intolerance 12/10/2018   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/15/2018   Chronic left shoulder pain 06/08/2018   Status post total hip replacement, right 04/26/2018   Mild dilation of ascending aorta ([place]) 12/26/2017   Dyslipidemia 12/26/2017   Bilateral carotid bruits 12/26/2017   Nocturnal hypoxemia 06/29/2017   Primary osteoarthritis of both hips 06/29/2017   Chronic diastolic heart failure ([place])    Coronary artery disease involving native coronary artery of native heart without angina pectoris 03/31/2017   Ischemic cardiomyopathy 03/31/2017   Marijuana use 03/31/2017   Prediabetes 03/31/2017   Aortic valve insufficiency 12/28/2016   Mitral valve regurgitation 12/28/2016   Obesity 12/28/2016   Aortic valve stenosis, nonrheumatic    Essential hypertension     REFERRING PROVIDER: Vanetta Mulders, MD  REFERRING DIAG: M75.101 (ICD-10-CM) - Rotator cuff syndrome of right shoulder   THERAPY DIAG:  Right shoulder pain, unspecified chronicity  Stiffness  of right shoulder, not elsewhere classified  Muscle weakness (generalized)  Rationale for Evaluation and Treatment Rehabilitation  ONSET DATE: DOS 11/29/2019  SUBJECTIVE:                                                                                                                                                                                      SUBJECTIVE STATEMENT: Pt is 3 weeks and 4 days s/p Arthroscopic supraspinatus and subscapularis repairs, biceps tenodesis,  debridement of labrum, and subacromial decompression.  Pt denies pain currently.  Pt states both his shoulders are not bothering him today.  Pt can have some throbbing pain if he is sitting in his chair with arm in certain positions.  Pt denies any adverse effects after prior Rx and states he can tell he did something though.  Pt states he has done the wrist/hand exercises at home, but not the pendulums as much.  Pt states he felt a pop in his shoulder while in the shower though didn't have pain.   PERTINENT HISTORY: -supraspinatus and subscapularis repairs, biceps tenodesis, debridement of labrum, and subacromial decompression on 11/28/21 -L shoulder pain, CHF with coronary Stent placement, R and L THA in 2020, OA   PAIN:  Are you having pain? No NPRS:  Current:  0/10, Best:  3/10, Worst:  9/10 Location:  R shoulder NPRS:  5/10 Location: L shoulder   PRECAUTIONS: Other: per surgical protocol, PROM  WEIGHT BEARING RESTRICTIONS Yes R UE  FALLS:  Has patient fallen in last 6 months? 1 fall, this injury  LIVING ENVIRONMENT: Lives with: lives alone   OCCUPATION: He is retired and on disability.   PLOF: Independent; Pt was able to perform his ADLs and IADLs and reaching activities independently.  PATIENT GOALS Reach top shelf, reach behind head.  OBJECTIVE:   DIAGNOSTIC FINDINGS:  Pt had x rays and MRI prior to surgery.   TODAY'S TREATMENT:   Manual Therapy: -Pt received   -Joint mob: LAD grade II with oscillations  -R AP and Inf GH jt mobs grade II  -R shoulder PROM per pt and tissue tolerance w/n protocol ranges in flexion and abduction.   Flexion PROM:  128 deg   Therapeutic Exercise: -Reviewed pain levels, response to prior Rx, and HEP compliance. -Pt performed:   2x10 reps scap squeeze  Pendulums cw, ccw, and ML 2x10 reps    PATIENT EDUCATION: Education details: post op protocol and restrictions, sling positioning, sling compliance, objective findings, POC, dx,  and relevant anatomy.    Person educated: Patient Education method: Customer service manager Education comprehension: verbalized understanding, returned demonstration, and needs further education   HOME EXERCISE PROGRAM: Access  Code: [email or web address] URL: [email or web address] Date: 12/11/2021 Prepared by: Ronny Flurry  ASSESSMENT:  CLINICAL IMPRESSION: Pt is making good progress with R shoulder PROM.  He did have less flexion PROM today though overall making good progress.  Pt tolerates PROM per protocol well.  Pt required cuing for correct form with pendulums.  He responded well to Rx having no pain after Rx.  Pt should benefit from skilled PT services per protocol to address impairments and to assist in restoring desired level of function.      OBJECTIVE IMPAIRMENTS decreased activity tolerance, decreased endurance, decreased ROM, decreased strength, hypomobility, impaired flexibility, impaired UE functional use, and pain.   ACTIVITY LIMITATIONS carrying, lifting, sleeping, bathing, dressing, reach over head, and hygiene/grooming  PARTICIPATION LIMITATIONS: meal prep, cleaning, laundry, driving, shopping, and community activity  PERSONAL FACTORS 1-2 comorbidities: L shoulder pain and OA  are also affecting patient's functional outcome.   REHAB POTENTIAL: Good  CLINICAL DECISION MAKING: Stable/uncomplicated  EVALUATION COMPLEXITY: Low   GOALS:   SHORT TERM GOALS: Target date: 12/31/2021  Pt will be independent and compliant with HEP for improved pain, ROM, and strength.  Baseline: Goal status: MET Target date: 12/31/2021    2.  Pt will tolerate PROM per protocol without adverse effects for improved ROM.  Baseline:  Goal status: MET Target date:  12/24/2021  3.  Pt will demo PROM to 110-120 deg in flexion and 0 deg in ER for improved stiffness and mobility.  Baseline:  Goal status: MET  4.  Pt will tolerate AAROM without adverse effects for progression  of protocol.  Baseline:  Goal status: ongoing  5. Pt will demo supine shoulder AAROM to be 120 deg in flexion and in 40 deg in ER for progression of AAROM and protocol Baseline:  Goal status: ongoing Target date:  01/21/2022   6.   Pt will wean out of sling per MD allowance without adverse effects Baseline:  Goal status: ongoing Target date:  01/14/2022  7.  Pt will demo R shoulder flexion and scaption AROM to at least 100 deg in standing without significant shoulder hike for improved UE elevation. Baseline:  Goal status: ongoing Target date:  02/18/2022  8.  Pt will be able to perform his self care activities with no > than minimal difficulty.   Baseline:  Goal status: ongoing Target date: 02/25/2022       LONG TERM GOALS:   Pt will demo R shoulder AROM to be [facility] t/o for performance of ADLs and IADLs.  Baseline:  Goal status: INITIAL Target date:  03/25/2022   2.  Pt will be able to perform his ADLs and IADLs without significant difficulty and pain. Baseline:  Goal status: INITIAL Target date:  03/25/2022  3.  Pt will be able to perform his normal reaching and overhead activities without significant pain and limitation. Baseline:  Goal status: INITIAL Target date:  03/25/2022  4.  Pt will demo at least 4/5 MMT strength t/o R shoulder for improved tolerance with and performance of daily activities, household chores, and functional lifting.  Baseline:  Goal status: INITIAL Target Date:  03/25/2022  5.  Pt will be independent with advanced HEP for improved shoulder strength and stability to assist with returning to desired level of function and performing functional carrying and lifting. Baseline:  Goal status: INITIAL    PLAN: PT FREQUENCY:  1x/wk x 4 weeks, 1-2x/wk x 2 weeks, and 2x/wk x 10 weeks  PT DURATION: other: 16 weeks  PLANNED  INTERVENTIONS: Therapeutic exercises, Therapeutic activity, Neuromuscular re-education, Patient/Family education, Self  Care, Joint mobilization, Aquatic Therapy, Dry Needling, Electrical stimulation, Spinal mobilization, Cryotherapy, Moist heat, scar mobilization, Taping, Ultrasound, Manual therapy, and Re-evaluation  PLAN FOR NEXT SESSION:  Cont with shoulder PROM per Dr. Eddie Dibbles protocol.  Pt had a subscapularis repair also, will use subscapularis repair protocol also.  No passive ER.     Selinda Michaels III PT, DPT 12/24/21 8:47 AM

## 2021-12-23 ENCOUNTER — Ambulatory Visit (HOSPITAL_BASED_OUTPATIENT_CLINIC_OR_DEPARTMENT_OTHER): Payer: Medicaid Other | Admitting: Physical Therapy

## 2021-12-23 ENCOUNTER — Encounter (HOSPITAL_BASED_OUTPATIENT_CLINIC_OR_DEPARTMENT_OTHER): Payer: Self-pay | Admitting: Physical Therapy

## 2021-12-23 DIAGNOSIS — M25511 Pain in right shoulder: Secondary | ICD-10-CM

## 2021-12-23 DIAGNOSIS — M25611 Stiffness of right shoulder, not elsewhere classified: Secondary | ICD-10-CM

## 2021-12-23 DIAGNOSIS — M6281 Muscle weakness (generalized): Secondary | ICD-10-CM

## 2021-12-26 ENCOUNTER — Encounter (HOSPITAL_BASED_OUTPATIENT_CLINIC_OR_DEPARTMENT_OTHER): Payer: Self-pay

## 2021-12-26 ENCOUNTER — Ambulatory Visit (HOSPITAL_BASED_OUTPATIENT_CLINIC_OR_DEPARTMENT_OTHER): Payer: Medicaid Other | Admitting: Physical Therapy

## 2021-12-27 ENCOUNTER — Other Ambulatory Visit: Payer: Self-pay | Admitting: Internal Medicine

## 2021-12-27 ENCOUNTER — Encounter: Payer: Self-pay | Admitting: Internal Medicine

## 2021-12-27 DIAGNOSIS — I10 Essential (primary) hypertension: Secondary | ICD-10-CM

## 2021-12-27 MED ORDER — AMLODIPINE BESYLATE 5 MG PO TABS: 5.0000 mg | ORAL_TABLET | Freq: Every day | ORAL | 0 refills | Status: DC

## 2021-12-27 NOTE — Telephone Encounter (Signed)
Medication Refill - Medication: amLODipine (NORVASC) 5 MG tablet  Has the patient contacted their pharmacy? Yes.    (Agent: If yes, when and what did the pharmacy advise?) Pharmacy sent a e scribe over on 12/18/2021, 12/21/2021 and pharmacy is calling in today checking on the status. Patient is completley out  Preferred Pharmacy (with phone number or street name):  [id number] [place] Phone:  [phone] Fax:  [phone]     Has the patient been seen for an appointment in the last year OR does the patient have an upcoming appointment? Yes.    Agent: Please be advised that RX refills may take up to 3 business days. We ask that you follow-up with your pharmacy.

## 2021-12-27 NOTE — Telephone Encounter (Signed)
Requested Prescriptions  Pending Prescriptions Disp Refills  . amLODipine (NORVASC) 5 MG tablet 90 tablet 1    Sig: Take 1 tablet (5 mg total) by mouth daily.     Cardiovascular: Calcium Channel Blockers 2 Passed - 12/27/2021 11:23 AM      Passed - Last BP in normal range    BP Readings from Last 1 Encounters:  11/28/21 128/78         Passed - Last Heart Rate in normal range    Pulse Readings from Last 1 Encounters:  11/28/21 60         Passed - Valid encounter within last 6 months    Recent Outpatient Visits          3 months ago Essential hypertension   [place], [place]   8 months ago Essential hypertension   [place], MD   12 months ago Essential hypertension   [place], Deborah B, MD   1 year ago Essential hypertension   [place], Deborah B, MD   1 year ago Essential hypertension   [place], MD      Future Appointments            In 1 week Ladell Pier, MD [place]

## 2022-01-01 ENCOUNTER — Ambulatory Visit (HOSPITAL_BASED_OUTPATIENT_CLINIC_OR_DEPARTMENT_OTHER): Payer: Medicaid Other | Admitting: Physical Therapy

## 2022-01-01 ENCOUNTER — Encounter (HOSPITAL_BASED_OUTPATIENT_CLINIC_OR_DEPARTMENT_OTHER): Payer: Self-pay | Admitting: Physical Therapy

## 2022-01-01 DIAGNOSIS — M25511 Pain in right shoulder: Secondary | ICD-10-CM | POA: Diagnosis not present

## 2022-01-01 DIAGNOSIS — M6281 Muscle weakness (generalized): Secondary | ICD-10-CM

## 2022-01-01 DIAGNOSIS — M25611 Stiffness of right shoulder, not elsewhere classified: Secondary | ICD-10-CM

## 2022-01-01 NOTE — Therapy (Signed)
OUTPATIENT PHYSICAL THERAPY TREATMENT NOTE   Patient Name: Joshua [name] MRN: [id number] DOB:December 06, 1960, 61 y.o., male Today's Date: 01/01/2022   PT End of Session - 01/01/22 1348     Visit Number 7    Number of Visits 26    Date for PT Re-Evaluation 02/25/22    Authorization Type U[employer] MCD    PT Start Time 4696    PT Stop Time 1415    PT Time Calculation (min) 30 min    Activity Tolerance Patient tolerated treatment well    Behavior During Therapy [facility] for tasks assessed/performed                  Past Medical History:  Diagnosis Date   Aortic stenosis    CAD (coronary artery disease), native coronary artery 12/26/2016   DES LAD & RCA, EF 25-30%   CHF (congestive heart failure) (HCC)    Dyslipidemia    Hypertension    Obesity    Osteoarthritis    Pre-diabetes    a. prior A1C 5.6, states he was on meds for this at one point.   Past Surgical History:  Procedure Laterality Date   CORONARY STENT INTERVENTION N/A 12/29/2016   Procedure: CORONARY STENT INTERVENTION;  Surgeon: Jettie Booze, MD;  Location: [place] [facility];  Service: Cardiovascular;  Laterality: N/A;   RIGHT/LEFT HEART CATH AND CORONARY ANGIOGRAPHY N/A 12/29/2016   Procedure: RIGHT/LEFT HEART CATH AND CORONARY ANGIOGRAPHY;  Surgeon: Jettie Booze, MD;  Location: [place] [facility];  Service: Cardiovascular;  Laterality: N/A;   SHOULDER ARTHROSCOPY WITH ROTATOR CUFF REPAIR AND OPEN BICEPS TENODESIS Right 11/28/2021   Procedure: RIGHT SHOULDER ARTHROSCOPY WITH ROTATOR CUFF REPAIR AND OPEN BICEPS TENODESIS;  Surgeon: Vanetta Mulders, MD;  Location: [place];  Service: Orthopedics;  Laterality: Right;   TONSILLECTOMY     TOTAL HIP ARTHROPLASTY Right 04/26/2018   Procedure: RIGHT TOTAL HIP ARTHROPLASTY ANTERIOR APPROACH;  Surgeon: Leandrew Koyanagi, MD;  Location: [place];  Service: Orthopedics;  Laterality: Right;   TOTAL HIP ARTHROPLASTY Left 11/15/2018   TOTAL HIP ARTHROPLASTY Left 11/15/2018    Procedure: LEFT TOTAL HIP ARTHROPLASTY ANTERIOR APPROACH;  Surgeon: Leandrew Koyanagi, MD;  Location: [place];  Service: Orthopedics;  Laterality: Left;   Patient Active Problem List   Diagnosis Date Noted   Biceps tendinitis of right upper extremity    Rotator cuff syndrome of right shoulder 10/16/2021   Other insomnia 04/30/2021   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   COVID-19 vaccine regimen to maintain immunity completed 04/27/2020   Statin intolerance 12/10/2018   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/15/2018   Chronic left shoulder pain 06/08/2018   Status post total hip replacement, right 04/26/2018   Mild dilation of ascending aorta ([place]) 12/26/2017   Dyslipidemia 12/26/2017   Bilateral carotid bruits 12/26/2017   Nocturnal hypoxemia 06/29/2017   Primary osteoarthritis of both hips 06/29/2017   Chronic diastolic heart failure ([place])    Coronary artery disease involving native coronary artery of native heart without angina pectoris 03/31/2017   Ischemic cardiomyopathy 03/31/2017   Marijuana use 03/31/2017   Prediabetes 03/31/2017   Aortic valve insufficiency 12/28/2016   Mitral valve regurgitation 12/28/2016   Obesity 12/28/2016   Aortic valve stenosis, nonrheumatic    Essential hypertension     REFERRING PROVIDER: Vanetta Mulders, MD  REFERRING DIAG: M75.101 (ICD-10-CM) - Rotator cuff syndrome of right shoulder   THERAPY DIAG:  Stiffness of right shoulder, not elsewhere  classified  Right shoulder pain, unspecified chronicity  Muscle weakness (generalized)  Rationale for Evaluation and Treatment Rehabilitation  ONSET DATE: DOS 11/29/2019  SUBJECTIVE:                                                                                                                                                                                      SUBJECTIVE STATEMENT: Pt is 4 weeks and 6 days s/p Arthroscopic supraspinatus and subscapularis repairs, biceps tenodesis,  debridement of labrum, and subacromial decompression.  Pt has had no pain since last session. Pt states he is not as consistent with exercise as he should be. He feels that the shoulder will throb at times. He has avoided AROM and resistance with the R shoulder so far. Has tried resistance at R wrist.  PERTINENT HISTORY: -supraspinatus and subscapularis repairs, biceps tenodesis, debridement of labrum, and subacromial decompression on 11/28/21 -L shoulder pain, CHF with coronary Stent placement, R and L THA in 2020, OA   PAIN:  Are you having pain? No NPRS:  Current:  0/10, Best:  3/10, Worst:  9/10 Location:  R shoulder NPRS:  5/10 Location: L shoulder   PRECAUTIONS: Other: per surgical protocol, PROM  WEIGHT BEARING RESTRICTIONS Yes R UE  FALLS:  Has patient fallen in last 6 months? 1 fall, this injury  LIVING ENVIRONMENT: Lives with: lives alone   OCCUPATION: He is retired and on disability.   PLOF: Independent; Pt was able to perform his ADLs and IADLs and reaching activities independently.  PATIENT GOALS Reach top shelf, reach behind head.  OBJECTIVE:   DIAGNOSTIC FINDINGS:  Pt had x rays and MRI prior to surgery.   TODAY'S TREATMENT:   Manual Therapy: -Pt received   -Joint mob: LAD grade II with oscillations  -R AP and Inf GH jt mobs grade II   PROM:  130 deg, 110 ABD, ER to 5 (very gentle); IR to belly  Therapeutic Exercise:  AAROM flexion and ABD 10x Table slide 10x flex and ABD Pulley's scaption 15mn    PATIENT EDUCATION: Education details: post op protocol and restrictions, sling positioning, sling compliance, objective findings, POC, dx, and relevant anatomy.    Person educated: Patient Education method: ECustomer service managerEducation comprehension: verbalized understanding, returned demonstration, and needs further education   HOME EXERCISE PROGRAM: Access Code: [email or web address] URL: [email or web address] Date:  12/11/2021 Prepared by: TRonny Flurry ASSESSMENT:  CLINICAL IMPRESSION: Pt able to progress to AAROM phase of rehab and was advised to try and stretch to available range with OH motions. Pt still advised to hold on active IR but can do light AAROM ER. Pt HEP updated at this time and  pt advised to be consistent with new HEP in order to reduce contracture of the R shoulder. Pt without pain throughout session. Plan to add AAROM ER to HEP at next. Pt should benefit from skilled PT services per protocol to address impairments and to assist in restoring desired level of function.      OBJECTIVE IMPAIRMENTS decreased activity tolerance, decreased endurance, decreased ROM, decreased strength, hypomobility, impaired flexibility, impaired UE functional use, and pain.   ACTIVITY LIMITATIONS carrying, lifting, sleeping, bathing, dressing, reach over head, and hygiene/grooming  PARTICIPATION LIMITATIONS: meal prep, cleaning, laundry, driving, shopping, and community activity  PERSONAL FACTORS 1-2 comorbidities: L shoulder pain and OA  are also affecting patient's functional outcome.   REHAB POTENTIAL: Good  CLINICAL DECISION MAKING: Stable/uncomplicated  EVALUATION COMPLEXITY: Low   GOALS:   SHORT TERM GOALS: Target date: 12/31/2021  Pt will be independent and compliant with HEP for improved pain, ROM, and strength.  Baseline: Goal status: MET Target date: 12/31/2021    2.  Pt will tolerate PROM per protocol without adverse effects for improved ROM.  Baseline:  Goal status: MET Target date:  12/24/2021  3.  Pt will demo PROM to 110-120 deg in flexion and 0 deg in ER for improved stiffness and mobility.  Baseline:  Goal status: MET  4.  Pt will tolerate AAROM without adverse effects for progression of protocol.  Baseline:  Goal status: ongoing  5. Pt will demo supine shoulder AAROM to be 120 deg in flexion and in 40 deg in ER for progression of AAROM and protocol Baseline:  Goal  status: ongoing Target date:  01/21/2022   6.   Pt will wean out of sling per MD allowance without adverse effects Baseline:  Goal status: ongoing Target date:  01/14/2022  7.  Pt will demo R shoulder flexion and scaption AROM to at least 100 deg in standing without significant shoulder hike for improved UE elevation. Baseline:  Goal status: ongoing Target date:  02/18/2022  8.  Pt will be able to perform his self care activities with no > than minimal difficulty.   Baseline:  Goal status: ongoing Target date: 02/25/2022       LONG TERM GOALS:   Pt will demo R shoulder AROM to be [facility] t/o for performance of ADLs and IADLs.  Baseline:  Goal status: INITIAL Target date:  03/25/2022   2.  Pt will be able to perform his ADLs and IADLs without significant difficulty and pain. Baseline:  Goal status: INITIAL Target date:  03/25/2022  3.  Pt will be able to perform his normal reaching and overhead activities without significant pain and limitation. Baseline:  Goal status: INITIAL Target date:  03/25/2022  4.  Pt will demo at least 4/5 MMT strength t/o R shoulder for improved tolerance with and performance of daily activities, household chores, and functional lifting.  Baseline:  Goal status: INITIAL Target Date:  03/25/2022  5.  Pt will be independent with advanced HEP for improved shoulder strength and stability to assist with returning to desired level of function and performing functional carrying and lifting. Baseline:  Goal status: INITIAL    PLAN: PT FREQUENCY:  1x/wk x 4 weeks, 1-2x/wk x 2 weeks, and 2x/wk x 10 weeks  PT DURATION: other: 16 weeks  PLANNED INTERVENTIONS: Therapeutic exercises, Therapeutic activity, Neuromuscular re-education, Patient/Family education, Self Care, Joint mobilization, Aquatic Therapy, Dry Needling, Electrical stimulation, Spinal mobilization, Cryotherapy, Moist heat, scar mobilization, Taping, Ultrasound, Manual therapy, and  Re-evaluation  PLAN FOR NEXT SESSION:  Cont with shoulder PROM per Dr. Eddie Dibbles protocol.  Pt had a subscapularis repair also, will use subscapularis repair protocol also.  No passive ER.     Daleen Bo PT, DPT 01/01/22 2:27 PM

## 2022-01-07 ENCOUNTER — Encounter (HOSPITAL_BASED_OUTPATIENT_CLINIC_OR_DEPARTMENT_OTHER): Payer: Self-pay | Admitting: Physical Therapy

## 2022-01-07 ENCOUNTER — Other Ambulatory Visit (HOSPITAL_BASED_OUTPATIENT_CLINIC_OR_DEPARTMENT_OTHER): Payer: Self-pay | Admitting: Orthopaedic Surgery

## 2022-01-07 ENCOUNTER — Other Ambulatory Visit: Payer: Self-pay | Admitting: Internal Medicine

## 2022-01-07 ENCOUNTER — Ambulatory Visit (HOSPITAL_BASED_OUTPATIENT_CLINIC_OR_DEPARTMENT_OTHER): Payer: Medicaid Other | Admitting: Physical Therapy

## 2022-01-07 DIAGNOSIS — M25511 Pain in right shoulder: Secondary | ICD-10-CM

## 2022-01-07 DIAGNOSIS — R0981 Nasal congestion: Secondary | ICD-10-CM

## 2022-01-07 DIAGNOSIS — M25611 Stiffness of right shoulder, not elsewhere classified: Secondary | ICD-10-CM

## 2022-01-07 DIAGNOSIS — M6281 Muscle weakness (generalized): Secondary | ICD-10-CM

## 2022-01-07 NOTE — Therapy (Addendum)
OUTPATIENT PHYSICAL THERAPY TREATMENT NOTE   Patient Name: Joshua [name] MRN: [id number] DOB:May 02, 1960, 61 y.o., male Today's Date: 01/07/2022   PT End of Session - 01/07/22 1144     Visit Number 8    Number of Visits 26    Date for PT Re-Evaluation 02/25/22    Authorization Type [employer] MCD    PT Start Time 2979    PT Stop Time 1218    PT Time Calculation (min) 33 min    Activity Tolerance Patient tolerated treatment well    Behavior During Therapy [facility] for tasks assessed/performed                  Past Medical History:  Diagnosis Date   Aortic stenosis    CAD (coronary artery disease), native coronary artery 12/26/2016   DES LAD & RCA, EF 25-30%   CHF (congestive heart failure) (HCC)    Dyslipidemia    Hypertension    Obesity    Osteoarthritis    Pre-diabetes    a. prior A1C 5.6, states he was on meds for this at one point.   Past Surgical History:  Procedure Laterality Date   CORONARY STENT INTERVENTION N/A 12/29/2016   Procedure: CORONARY STENT INTERVENTION;  Surgeon: Jettie Booze, MD;  Location: [place] [facility];  Service: Cardiovascular;  Laterality: N/A;   RIGHT/LEFT HEART CATH AND CORONARY ANGIOGRAPHY N/A 12/29/2016   Procedure: RIGHT/LEFT HEART CATH AND CORONARY ANGIOGRAPHY;  Surgeon: Jettie Booze, MD;  Location: [place] [facility];  Service: Cardiovascular;  Laterality: N/A;   SHOULDER ARTHROSCOPY WITH ROTATOR CUFF REPAIR AND OPEN BICEPS TENODESIS Right 11/28/2021   Procedure: RIGHT SHOULDER ARTHROSCOPY WITH ROTATOR CUFF REPAIR AND OPEN BICEPS TENODESIS;  Surgeon: Vanetta Mulders, MD;  Location: [place];  Service: Orthopedics;  Laterality: Right;   TONSILLECTOMY     TOTAL HIP ARTHROPLASTY Right 04/26/2018   Procedure: RIGHT TOTAL HIP ARTHROPLASTY ANTERIOR APPROACH;  Surgeon: Leandrew Koyanagi, MD;  Location: [place];  Service: Orthopedics;  Laterality: Right;   TOTAL HIP ARTHROPLASTY Left 11/15/2018   TOTAL HIP ARTHROPLASTY Left 11/15/2018    Procedure: LEFT TOTAL HIP ARTHROPLASTY ANTERIOR APPROACH;  Surgeon: Leandrew Koyanagi, MD;  Location: [place];  Service: Orthopedics;  Laterality: Left;   Patient Active Problem List   Diagnosis Date Noted   Biceps tendinitis of right upper extremity    Rotator cuff syndrome of right shoulder 10/16/2021   Other insomnia 04/30/2021   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   COVID-19 vaccine regimen to maintain immunity completed 04/27/2020   Statin intolerance 12/10/2018   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/15/2018   Chronic left shoulder pain 06/08/2018   Status post total hip replacement, right 04/26/2018   Mild dilation of ascending aorta ([place]) 12/26/2017   Dyslipidemia 12/26/2017   Bilateral carotid bruits 12/26/2017   Nocturnal hypoxemia 06/29/2017   Primary osteoarthritis of both hips 06/29/2017   Chronic diastolic heart failure ([place])    Coronary artery disease involving native coronary artery of native heart without angina pectoris 03/31/2017   Ischemic cardiomyopathy 03/31/2017   Marijuana use 03/31/2017   Prediabetes 03/31/2017   Aortic valve insufficiency 12/28/2016   Mitral valve regurgitation 12/28/2016   Obesity 12/28/2016   Aortic valve stenosis, nonrheumatic    Essential hypertension     REFERRING PROVIDER: Vanetta Mulders, MD  REFERRING DIAG: M75.101 (ICD-10-CM) - Rotator cuff syndrome of right shoulder   THERAPY DIAG:  Stiffness of right shoulder, not elsewhere  classified  Right shoulder pain, unspecified chronicity  Muscle weakness (generalized)  Rationale for Evaluation and Treatment Rehabilitation  ONSET DATE: DOS 11/29/2019  Days since surgery: 40   SUBJECTIVE:                                                                                                                                                                                      SUBJECTIVE STATEMENT: Pt is 5 weeks and 5 days s/p Arthroscopic supraspinatus and subscapularis  repairs, biceps tenodesis, debridement of labrum, and subacromial decompression.    Pt has had no pain since last session. Pt states he has been keeping up with HEP and it feels better now. He still feels that it is tight, especially his R elbow.   PERTINENT HISTORY: -supraspinatus and subscapularis repairs, biceps tenodesis, debridement of labrum, and subacromial decompression on 11/28/21 -L shoulder pain, CHF with coronary Stent placement, R and L THA in 2020, OA   PAIN:  Are you having pain? No NPRS:  Current:  0/10, Best:  3/10, Worst:  9/10 Location:  R shoulder NPRS:  5/10 Location: L shoulder   PRECAUTIONS: Other: per surgical protocol, PROM  WEIGHT BEARING RESTRICTIONS Yes R UE  FALLS:  Has patient fallen in last 6 months? 1 fall, this injury  LIVING ENVIRONMENT: Lives with: lives alone   OCCUPATION: He is retired and on disability.   PLOF: Independent; Pt was able to perform his ADLs and IADLs and reaching activities independently.  PATIENT GOALS Reach top shelf, reach behind head.  OBJECTIVE:   DIAGNOSTIC FINDINGS:  Pt had x rays and MRI prior to surgery.   TODAY'S TREATMENT:   Manual Therapy: -Pt received   -Joint mob: LAD grade II with oscillations  -R AP and Inf GH jt mobs grade III   PROM:  140 deg flex, 120 ABD, ER to 5 (very gentle); IR to belly  Therapeutic Exercise: Supine press 10x AROM Scaption 2x10 AAROM flexion and ABD 10x Table slide 10x flex and ABD Pulley's scaption and ABD 39mn Bent over table supported row 2x10 Standing AROM flexion to 45 deg 2x10    PATIENT EDUCATION: Education details: post op protocol and restrictions, sling positioning, sling compliance, objective findings, POC, dx, and relevant anatomy.    Person educated: Patient Education method: ECustomer service managerEducation comprehension: verbalized understanding, returned demonstration, and needs further education   HOME EXERCISE PROGRAM: Access Code:  [email or web address] URL: [email or web address] Date: 12/11/2021 Prepared by: TRonny Flurry ASSESSMENT:  CLINICAL IMPRESSION: Pt able to improve with AAROM and reach to greater than 100 deg into flexion, scaption, and ABD with assistance. Pt is still limited with AROM into flexion  at this time but is expected given mixed compliance with HEP and recent shoulder stiffness. Pt is progressing as expected at this time. Pt to have 6 wk follow up with MD this week for clearance of next phase of rehab. Pt should benefit from skilled PT services per protocol to address impairments and to assist in restoring desired level of function.      OBJECTIVE IMPAIRMENTS decreased activity tolerance, decreased endurance, decreased ROM, decreased strength, hypomobility, impaired flexibility, impaired UE functional use, and pain.   ACTIVITY LIMITATIONS carrying, lifting, sleeping, bathing, dressing, reach over head, and hygiene/grooming  PARTICIPATION LIMITATIONS: meal prep, cleaning, laundry, driving, shopping, and community activity  PERSONAL FACTORS 1-2 comorbidities: L shoulder pain and OA  are also affecting patient's functional outcome.   REHAB POTENTIAL: Good  CLINICAL DECISION MAKING: Stable/uncomplicated  EVALUATION COMPLEXITY: Low   GOALS:   SHORT TERM GOALS: Target date: 12/31/2021  Pt will be independent and compliant with HEP for improved pain, ROM, and strength.  Baseline: Goal status: MET Target date: 12/31/2021    2.  Pt will tolerate PROM per protocol without adverse effects for improved ROM.  Baseline:  Goal status: MET Target date:  12/24/2021  3.  Pt will demo PROM to 110-120 deg in flexion and 0 deg in ER for improved stiffness and mobility.  Baseline:  Goal status: MET  4.  Pt will tolerate AAROM without adverse effects for progression of protocol.  Baseline:  Goal status: ongoing  5. Pt will demo supine shoulder AAROM to be 120 deg in flexion and in 40 deg in ER for  progression of AAROM and protocol Baseline:  Goal status: ongoing Target date:  01/21/2022   6.   Pt will wean out of sling per MD allowance without adverse effects Baseline:  Goal status: ongoing Target date:  01/14/2022  7.  Pt will demo R shoulder flexion and scaption AROM to at least 100 deg in standing without significant shoulder hike for improved UE elevation. Baseline:  Goal status: ongoing Target date:  02/18/2022  8.  Pt will be able to perform his self care activities with no > than minimal difficulty.   Baseline:  Goal status: ongoing Target date: 02/25/2022       LONG TERM GOALS:   Pt will demo R shoulder AROM to be [facility] t/o for performance of ADLs and IADLs.  Baseline:  Goal status: INITIAL Target date:  03/25/2022   2.  Pt will be able to perform his ADLs and IADLs without significant difficulty and pain. Baseline:  Goal status: INITIAL Target date:  03/25/2022  3.  Pt will be able to perform his normal reaching and overhead activities without significant pain and limitation. Baseline:  Goal status: INITIAL Target date:  03/25/2022  4.  Pt will demo at least 4/5 MMT strength t/o R shoulder for improved tolerance with and performance of daily activities, household chores, and functional lifting.  Baseline:  Goal status: INITIAL Target Date:  03/25/2022  5.  Pt will be independent with advanced HEP for improved shoulder strength and stability to assist with returning to desired level of function and performing functional carrying and lifting. Baseline:  Goal status: INITIAL    PLAN: PT FREQUENCY:  1x/wk x 4 weeks, 1-2x/wk x 2 weeks, and 2x/wk x 10 weeks  PT DURATION: other: 16 weeks  PLANNED INTERVENTIONS: Therapeutic exercises, Therapeutic activity, Neuromuscular re-education, Patient/Family education, Self Care, Joint mobilization, Aquatic Therapy, Dry Needling, Electrical stimulation, Spinal mobilization, Cryotherapy, Moist heat,  scar  mobilization, Taping, Ultrasound, Manual therapy, and Re-evaluation  PLAN FOR NEXT SESSION:  Cont with shoulder PROM per Dr. Eddie Dibbles protocol.  Pt had a subscapularis repair also, will use subscapularis repair protocol also.  No passive ER.     Daleen Bo PT, DPT 01/07/22 12:55 PM

## 2022-01-09 ENCOUNTER — Encounter: Payer: Self-pay | Admitting: Internal Medicine

## 2022-01-09 ENCOUNTER — Ambulatory Visit: Payer: Medicaid Other | Attending: Internal Medicine | Admitting: Internal Medicine

## 2022-01-09 ENCOUNTER — Ambulatory Visit: Payer: Medicaid Other | Admitting: Internal Medicine

## 2022-01-09 VITALS — BP 103/69 | HR 74 | Temp 99.1°F | Ht 73.0 in | Wt 212.6 lb

## 2022-01-09 DIAGNOSIS — I10 Essential (primary) hypertension: Secondary | ICD-10-CM | POA: Diagnosis not present

## 2022-01-09 DIAGNOSIS — I2510 Atherosclerotic heart disease of native coronary artery without angina pectoris: Secondary | ICD-10-CM | POA: Diagnosis not present

## 2022-01-09 DIAGNOSIS — Z23 Encounter for immunization: Secondary | ICD-10-CM | POA: Diagnosis not present

## 2022-01-09 DIAGNOSIS — E782 Mixed hyperlipidemia: Secondary | ICD-10-CM | POA: Diagnosis not present

## 2022-01-09 DIAGNOSIS — R7303 Prediabetes: Secondary | ICD-10-CM | POA: Diagnosis not present

## 2022-01-09 NOTE — Progress Notes (Signed)
Patient ID: Joshua [name], male    DOB: 25-Jan-1961  MRN: [id number]  CC: Hypertension (Hypertension f/u. Concern with left shoulder. )   Subjective: Joshua [name] is a 61 y.o. male who presents for chronic ds management His concerns today include:  Patient with history of HTN, OA BL hips s/p TKR, prediabetes, CAD [with stent to LAD, RCA], EF 55-60%%, bradycardia , bicuspid aortic valve, mod AS, mild dilated ascending aorta.  HTN/CAD/bicuspid aortic valve/mod AS: Reports compliance with Coreg 3.125 mg BID, lisinopril 10 mg, Norvasc 5 mg and ASA.  Saw cardiologist 10/2021.  Lipitor changed to Repatha as Lipitor was causing intermittent elev in LFTs.   -no CP/SOB/LE edema.  No dizziness or syncope.  Last echo 07/2021.  This revealed normal EF, grade 2 diastolic dysfunction and moderate AS.  On Last visit he c/o pain and limited ROM RT shoulder that is chronic but got a little worse x 3 mths.  Fell against his car on RT shoulder 3 mths prior to that visit to try to break fall Referred to Dr. Erlinda Hong.  MRI showed: IMPRESSION: 1. Massive rotator cuff tears. 2. Medial dislocation of the long head biceps tendon into the glenohumeral joint space. 3. Mild acromioclavicular and glenohumeral osteoarthritis. Moderate glenohumeral joint effusion  -had surgery mid August and now doing P.T once a wk  Down about 20 lbs since last visit with me 4 mths ago Reports that wgh loss is intentional. Reports he is not eating as much and eating more veggies and salads.   Last A1C 4 mths ago was 6.1 in range for preDM  HM: Due for flu shot Patient Active Problem List   Diagnosis Date Noted   Biceps tendinitis of right upper extremity    Rotator cuff syndrome of right shoulder 10/16/2021   Other insomnia 04/30/2021   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   COVID-19 vaccine regimen to maintain immunity completed 04/27/2020   Statin intolerance 12/10/2018   Status post total replacement of left  hip 11/15/2018   Chronic left shoulder pain 06/08/2018   Status post total hip replacement, right 04/26/2018   Mild dilation of ascending aorta ([place]) 12/26/2017   Dyslipidemia 12/26/2017   Bilateral carotid bruits 12/26/2017   Nocturnal hypoxemia 06/29/2017   Primary osteoarthritis of both hips 06/29/2017   Chronic diastolic heart failure ([place])    Coronary artery disease involving native coronary artery of native heart without angina pectoris 03/31/2017   Ischemic cardiomyopathy 03/31/2017   Marijuana use 03/31/2017   Prediabetes 03/31/2017   Aortic valve insufficiency 12/28/2016   Mitral valve regurgitation 12/28/2016   Obesity 12/28/2016   Aortic valve stenosis, nonrheumatic    Essential hypertension      Current Outpatient Medications on File Prior to Visit  Medication Sig Dispense Refill   amLODipine (NORVASC) 5 MG tablet Take 1 tablet (5 mg total) by mouth daily. 90 tablet 0   aspirin EC 325 MG tablet TAKE 1 TABLET BY MOUTH EVERY DAY 30 tablet 0   carvedilol (COREG) 3.125 MG tablet Take 1 tablet (3.125 mg total) by mouth 2 (two) times daily. 180 tablet 2   Evolocumab (REPATHA SURECLICK) 782 MG/ML SOAJ Inject 1 mL into the skin every 14 (fourteen) days. 6 mL 3   fluticasone (FLONASE) 50 MCG/ACT nasal spray PLACE 1 SPRAY INTO BOTH NOSTRILS DAILY AS NEEDED FOR ALLERGIES OR RHINITIS. 16 mL 0   ibuprofen (ADVIL) 800 MG tablet TAKE 1 TABLET BY MOUTH EVERY 8 HOURS FOR 10 DAYS.  TAKE WITH FOOD, ALTERNATE WITH ACETAMINOPHEN 30 tablet 0   lisinopril (ZESTRIL) 10 MG tablet TAKE 1 TABLET BY MOUTH TWICE A DAY 180 tablet 0   loratadine (CLARITIN) 10 MG tablet Take 10 mg by mouth daily.     methocarbamol (ROBAXIN) 500 MG tablet Take 1 tablet (500 mg total) by mouth 2 (two) times daily as needed for muscle spasms. 20 tablet 2   Multiple Vitamins-Minerals (PRESERVISION AREDS 2) CAPS Take 1 capsule by mouth 2 (two) times daily.     oxyCODONE (ROXICODONE) 5 MG immediate release tablet Take 1 tablet  (5 mg total) by mouth every 4 (four) hours as needed for severe pain. 30 tablet 0   potassium chloride (KLOR-CON) 10 MEQ tablet TAKE 1 TABLET (10 MEQ TOTAL) BY MOUTH DAILY. DX: HYPOKALEMIA 90 tablet 1   No current facility-administered medications on file prior to visit.    Allergies  Allergen Reactions   Statins Other (See Comments)    Elevated LFTs    Social History   Socioeconomic History   Marital status: Single    Spouse name: Not on file   Number of children: Not on file   Years of education: Not on file   Highest education level: Not on file  Occupational History   Not on file  Tobacco Use   Smoking status: Former   Smokeless tobacco: Never   Tobacco comments:    Smoked lightly for approx 20 years, quit 1990s  Vaping Use   Vaping Use: Never used  Substance and Sexual Activity   Alcohol use: Not Currently   Drug use: Yes    Types: Marijuana    Comment: every night (to sleep)   Sexual activity: Not on file  Other Topics Concern   Not on file  Social History Narrative   Lives Home alone. Sister makes all health related decisions. Declined Advanced directive at this time   Social Determinants of Health   Financial Resource Strain: Not on file  Food Insecurity: Not on file  Transportation Needs: Not on file  Physical Activity: Not on file  Stress: Not on file  Social Connections: Not on file  Intimate Partner Violence: Not on file    Family History  Problem Relation Age of Onset   CAD Father        MI/CABG age 8    Past Surgical History:  Procedure Laterality Date   CORONARY STENT INTERVENTION N/A 12/29/2016   Procedure: CORONARY STENT INTERVENTION;  Surgeon: Jettie Booze, MD;  Location: [place] [facility];  Service: Cardiovascular;  Laterality: N/A;   RIGHT/LEFT HEART CATH AND CORONARY ANGIOGRAPHY N/A 12/29/2016   Procedure: RIGHT/LEFT HEART CATH AND CORONARY ANGIOGRAPHY;  Surgeon: Jettie Booze, MD;  Location: [place] [facility];   Service: Cardiovascular;  Laterality: N/A;   SHOULDER ARTHROSCOPY WITH ROTATOR CUFF REPAIR AND OPEN BICEPS TENODESIS Right 11/28/2021   Procedure: RIGHT SHOULDER ARTHROSCOPY WITH ROTATOR CUFF REPAIR AND OPEN BICEPS TENODESIS;  Surgeon: Vanetta Mulders, MD;  Location: [place];  Service: Orthopedics;  Laterality: Right;   TONSILLECTOMY     TOTAL HIP ARTHROPLASTY Right 04/26/2018   Procedure: RIGHT TOTAL HIP ARTHROPLASTY ANTERIOR APPROACH;  Surgeon: Leandrew Koyanagi, MD;  Location: [place];  Service: Orthopedics;  Laterality: Right;   TOTAL HIP ARTHROPLASTY Left 11/15/2018   TOTAL HIP ARTHROPLASTY Left 11/15/2018   Procedure: LEFT TOTAL HIP ARTHROPLASTY ANTERIOR APPROACH;  Surgeon: Leandrew Koyanagi, MD;  Location: [place];  Service: Orthopedics;  Laterality: Left;  ROS: Review of Systems Negative except as stated above  PHYSICAL EXAM: BP 103/69 (BP Location: Left Arm, Patient Position: Sitting, Cuff Size: Normal)   Pulse 74   Temp 99.1 F (37.3 C) (Oral)   Ht '6\' 1"'$  (1.854 m)   Wt 212 lb 9.6 oz (96.4 kg)   SpO2 94%   BMI 28.05 kg/m   Wt Readings from Last 3 Encounters:  01/09/22 212 lb 9.6 oz (96.4 kg)  11/28/21 212 lb (96.2 kg)  11/07/21 224 lb (101.6 kg)    Physical Exam  General appearance - alert, well appearing, and in no distress Mental status - normal mood, behavior, speech, dress, motor activity, and thought processes Neck - supple, no significant adenopathy Chest - clear to auscultation, no wheezes, rales or rhonchi, symmetric air entry Heart -regular rate rhythm.  3/6 systolic ejection murmur. Extremities -no lower extremity edema. MSK: Right arm is in a sling with an extender under the arm.     Latest Ref Rng & Units 11/28/2021   11:00 AM 09/10/2021   11:32 AM 05/14/2021   11:41 AM  CMP  Glucose 70 - 99 mg/dL 134     BUN 8 - 23 mg/dL 13     Creatinine 0.61 - 1.24 mg/dL 0.91     Sodium 135 - 145 mmol/L 139     Potassium 3.5 - 5.1 mmol/L 4.1     Chloride 98 - 111 mmol/L 106      CO2 22 - 32 mmol/L 25     Calcium 8.9 - 10.3 mg/dL 9.4     Total Protein 6.0 - 8.5 g/dL  6.9  6.8   Total Bilirubin 0.0 - 1.2 mg/dL  0.4  0.4   Alkaline Phos 44 - 121 IU/L  142  118   AST 0 - 40 IU/L  21  14   ALT 0 - 44 IU/L  58  23    Lipid Panel     Component Value Date/Time   CHOL 114 09/10/2021 1132   TRIG 167 (H) 09/10/2021 1132   HDL 34 (L) 09/10/2021 1132   CHOLHDL 3.4 09/10/2021 1132   CHOLHDL 4.6 12/27/2016 0238   VLDL 18 12/27/2016 0238   LDLCALC 52 09/10/2021 1132    CBC    Component Value Date/Time   WBC 6.1 11/28/2021 1100   RBC 4.63 11/28/2021 1100   HGB 14.7 11/28/2021 1100   HGB 17.4 12/28/2020 0953   HCT 43.1 11/28/2021 1100   HCT 51.2 (H) 12/28/2020 0953   PLT 145 (L) 11/28/2021 1100   PLT 184 12/28/2020 0953   MCV 93.1 11/28/2021 1100   MCV 94 12/28/2020 0953   MCH 31.7 11/28/2021 1100   MCHC 34.1 11/28/2021 1100   RDW 12.8 11/28/2021 1100   RDW 13.3 12/28/2020 0953   LYMPHSABS 1.8 09/29/2019 1420   MONOABS 0.6 11/22/2018 1520   EOSABS 0.0 09/29/2019 1420   BASOSABS 0.0 09/29/2019 1420    ASSESSMENT AND PLAN: 1. Essential hypertension At goal.  Continue lisinopril 10 mg, amlodipine 5 mg daily.  2. Coronary artery disease involving native coronary artery of native heart without angina pectoris Stable.  Continue aspirin, carvedilol and Repatha.  3. Prediabetes Commended him on weight loss.  Encouraged him to continue to try to eat a healthy diet and to move as much as he can.  4. Mixed hyperlipidemia On Repatha through cardiology.  5. Need for immunization against influenza - Flu Vaccine QUAD 8moIM (Fluarix, Fluzone & Alfiuria Quad PF)  Patient was given the opportunity to ask questions.  Patient verbalized understanding of the plan and was able to repeat key elements of the plan.   This documentation was completed using Radio producer.  Any transcriptional errors are unintentional.  No orders of the defined  types were placed in this encounter.    Requested Prescriptions    No prescriptions requested or ordered in this encounter    No follow-ups on file.  Karle Plumber, MD, FACP

## 2022-01-13 ENCOUNTER — Inpatient Hospital Stay: Admission: RE | Admit: 2022-01-13 | Payer: Medicaid Other | Source: Ambulatory Visit

## 2022-01-16 ENCOUNTER — Encounter (HOSPITAL_BASED_OUTPATIENT_CLINIC_OR_DEPARTMENT_OTHER): Payer: Self-pay | Admitting: Physical Therapy

## 2022-01-16 ENCOUNTER — Ambulatory Visit (HOSPITAL_BASED_OUTPATIENT_CLINIC_OR_DEPARTMENT_OTHER): Payer: Medicaid Other | Attending: Orthopaedic Surgery | Admitting: Physical Therapy

## 2022-01-16 ENCOUNTER — Ambulatory Visit (INDEPENDENT_AMBULATORY_CARE_PROVIDER_SITE_OTHER): Payer: Medicaid Other | Admitting: Orthopaedic Surgery

## 2022-01-16 DIAGNOSIS — M6281 Muscle weakness (generalized): Secondary | ICD-10-CM | POA: Insufficient documentation

## 2022-01-16 DIAGNOSIS — M25511 Pain in right shoulder: Secondary | ICD-10-CM | POA: Diagnosis present

## 2022-01-16 DIAGNOSIS — M25611 Stiffness of right shoulder, not elsewhere classified: Secondary | ICD-10-CM | POA: Insufficient documentation

## 2022-01-16 DIAGNOSIS — M75101 Unspecified rotator cuff tear or rupture of right shoulder, not specified as traumatic: Secondary | ICD-10-CM

## 2022-01-16 NOTE — Therapy (Signed)
OUTPATIENT PHYSICAL THERAPY TREATMENT NOTE   Patient Name: Joshua [name] MRN: [id number] DOB:1961/03/06, 61 y.o., male Today's Date: 01/16/2022   PT End of Session - 01/16/22 1058     Visit Number 9    Number of Visits 26    Date for PT Re-Evaluation 02/25/22    Authorization Type [employer] MCD    PT Start Time 6294    PT Stop Time 1058    PT Time Calculation (min) 30 min    Activity Tolerance Patient tolerated treatment well    Behavior During Therapy WFL for tasks assessed/performed                   Past Medical History:  Diagnosis Date   Aortic stenosis    CAD (coronary artery disease), native coronary artery 12/26/2016   DES LAD & RCA, EF 25-30%   CHF (congestive heart failure) (HCC)    Dyslipidemia    Hypertension    Obesity    Osteoarthritis    Pre-diabetes    a. prior A1C 5.6, states he was on meds for this at one point.   Past Surgical History:  Procedure Laterality Date   CORONARY STENT INTERVENTION N/A 12/29/2016   Procedure: CORONARY STENT INTERVENTION;  Surgeon: Jettie Booze, MD;  Location: [facility];  Service: Cardiovascular;  Laterality: N/A;   RIGHT/LEFT HEART CATH AND CORONARY ANGIOGRAPHY N/A 12/29/2016   Procedure: RIGHT/LEFT HEART CATH AND CORONARY ANGIOGRAPHY;  Surgeon: Jettie Booze, MD;  Location: [facility];  Service: Cardiovascular;  Laterality: N/A;   SHOULDER ARTHROSCOPY WITH ROTATOR CUFF REPAIR AND OPEN BICEPS TENODESIS Right 11/28/2021   Procedure: RIGHT SHOULDER ARTHROSCOPY WITH ROTATOR CUFF REPAIR AND OPEN BICEPS TENODESIS;  Surgeon: Vanetta Mulders, MD;  Location: [place];  Service: Orthopedics;  Laterality: Right;   TONSILLECTOMY     TOTAL HIP ARTHROPLASTY Right 04/26/2018   Procedure: RIGHT TOTAL HIP ARTHROPLASTY ANTERIOR APPROACH;  Surgeon: Leandrew Koyanagi, MD;  Location: [place];  Service: Orthopedics;  Laterality: Right;   TOTAL HIP ARTHROPLASTY Left 11/15/2018   TOTAL HIP ARTHROPLASTY Left 11/15/2018    Procedure: LEFT TOTAL HIP ARTHROPLASTY ANTERIOR APPROACH;  Surgeon: Leandrew Koyanagi, MD;  Location: [place];  Service: Orthopedics;  Laterality: Left;   Patient Active Problem List   Diagnosis Date Noted   Biceps tendinitis of right upper extremity    Rotator cuff syndrome of right shoulder 10/16/2021   Other insomnia 04/30/2021   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   COVID-19 vaccine regimen to maintain immunity completed 04/27/2020   Statin intolerance 12/10/2018   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/15/2018   Chronic left shoulder pain 06/08/2018   Status post total hip replacement, right 04/26/2018   Mild dilation of ascending aorta ([place]) 12/26/2017   Dyslipidemia 12/26/2017   Bilateral carotid bruits 12/26/2017   Nocturnal hypoxemia 06/29/2017   Primary osteoarthritis of both hips 06/29/2017   Chronic diastolic heart failure ([place])    Coronary artery disease involving native coronary artery of native heart without angina pectoris 03/31/2017   Ischemic cardiomyopathy 03/31/2017   Marijuana use 03/31/2017   Prediabetes 03/31/2017   Aortic valve insufficiency 12/28/2016   Mitral valve regurgitation 12/28/2016   Obesity 12/28/2016   Aortic valve stenosis, nonrheumatic    Essential hypertension     REFERRING PROVIDER: Vanetta Mulders, MD  REFERRING DIAG: M75.101 (ICD-10-CM) - Rotator cuff syndrome of right shoulder   THERAPY DIAG:  Stiffness of right shoulder, not  elsewhere classified  Right shoulder pain, unspecified chronicity  Muscle weakness (generalized)  Rationale for Evaluation and Treatment Rehabilitation  ONSET DATE: DOS 11/29/2019  Days since surgery: 43   SUBJECTIVE:                                                                                                                                                                                      SUBJECTIVE STATEMENT: Pt is 7 weeks s/p Arthroscopic supraspinatus and subscapularis repairs, biceps  tenodesis, debridement of labrum, and subacromial decompression.    Pt has had no pain since last session. He just came from MD session and states he is now out of the sling.   PERTINENT HISTORY: -supraspinatus and subscapularis repairs, biceps tenodesis, debridement of labrum, and subacromial decompression on 11/28/21 -L shoulder pain, CHF with coronary Stent placement, R and L THA in 2020, OA   PAIN:  Are you having pain? No NPRS:  Current:  0/10, Best:  3/10, Worst:  9/10 Location:  R shoulder NPRS:  5/10 Location: L shoulder   PRECAUTIONS: Other: per surgical protocol, PROM  WEIGHT BEARING RESTRICTIONS Yes R UE  FALLS:  Has patient fallen in last 6 months? 1 fall, this injury  LIVING ENVIRONMENT: Lives with: lives alone   OCCUPATION: He is retired and on disability.   PLOF: Independent; Pt was able to perform his ADLs and IADLs and reaching activities independently.  PATIENT GOALS Reach top shelf, reach behind head.  OBJECTIVE:   DIAGNOSTIC FINDINGS:  Pt had x rays and MRI prior to surgery.   TODAY'S TREATMENT:   Manual Therapy: -Pt received   -Joint mob: LAD grade II with oscillations  -R AP and Inf GH jt mobs grade III   PROM:  155 deg flex, 135 ABD, ER to 10 ; IR to belly  Exercises - Supine Shoulder Flexion Extension Full Range AROM  - 1 x daily - 7 x weekly - 2 sets - 10 reps - Standing Shoulder Abduction AAROM with Dowel  - 2 x daily - 7 x weekly - 2 sets - 10 reps - 2 hold - Seated Shoulder Scaption AAROM with Pulley at Side  - 2 x daily - 7 x weekly - 2 sets - 10 reps - 5 hold - Seated Shoulder Abduction AAROM with Pulley Behind  - 1 x daily - 7 x weekly - 3 sets - 10 reps - 5 hold - Standing Bent Over Single Arm Scapular Row with Table Support  - 1 x daily - 7 x weekly - 3 sets - 10 reps - Standing Isometric Shoulder External Rotation with Doorway  - 1 x daily - 7 x  weekly - 1 sets - 10 reps - 5 hold - Isometric Shoulder Flexion at Wall  - 1 x daily  - 7 x weekly - 1 sets - 10 reps - 5 hold - Standing Isometric Shoulder Abduction with Doorway - Arm Bent  - 1 x daily - 7 x weekly - 1 sets - 10 reps - 5 hold    PATIENT EDUCATION: Education details: post op protocol and restrictions, sling positioning, sling compliance, objective findings, POC, dx, and relevant anatomy.    Person educated: Patient Education method: Customer service manager Education comprehension: verbalized understanding, returned demonstration, and needs further education   HOME EXERCISE PROGRAM: Access Code: [email or web address] URL: [email or web address] Date: 12/11/2021 Prepared by: Ronny Flurry  ASSESSMENT:  CLINICAL IMPRESSION: Pt able to progress through protocol today. Session limited by late arrival. Pt able to intro light isometrics and improve to AROM. Pt not able to reach full end range at this time, mostly shoulder stiffness but no pain noted. Plan to continue per protocol. Pt directed to do exercise on the L UE as well in order to improve complaints of L shoulder pain, as directed as by MD. HEP provided to be performed bilaterally. Pt advised no lifting more than 2-3lb items at home, especially nothing [place]. Pt should benefit from skilled PT services per protocol to address impairments and to assist in restoring desired level of function.      OBJECTIVE IMPAIRMENTS decreased activity tolerance, decreased endurance, decreased ROM, decreased strength, hypomobility, impaired flexibility, impaired UE functional use, and pain.   ACTIVITY LIMITATIONS carrying, lifting, sleeping, bathing, dressing, reach over head, and hygiene/grooming  PARTICIPATION LIMITATIONS: meal prep, cleaning, laundry, driving, shopping, and community activity  PERSONAL FACTORS 1-2 comorbidities: L shoulder pain and OA  are also affecting patient's functional outcome.   REHAB POTENTIAL: Good  CLINICAL DECISION MAKING: Stable/uncomplicated  EVALUATION COMPLEXITY:  Low   GOALS:   SHORT TERM GOALS: Target date: 12/31/2021  Pt will be independent and compliant with HEP for improved pain, ROM, and strength.  Baseline: Goal status: MET Target date: 12/31/2021    2.  Pt will tolerate PROM per protocol without adverse effects for improved ROM.  Baseline:  Goal status: MET Target date:  12/24/2021  3.  Pt will demo PROM to 110-120 deg in flexion and 0 deg in ER for improved stiffness and mobility.  Baseline:  Goal status: MET  4.  Pt will tolerate AAROM without adverse effects for progression of protocol.  Baseline:  Goal status: ongoing  5. Pt will demo supine shoulder AAROM to be 120 deg in flexion and in 40 deg in ER for progression of AAROM and protocol Baseline:  Goal status: ongoing Target date:  01/21/2022   6.   Pt will wean out of sling per MD allowance without adverse effects Baseline:  Goal status: ongoing Target date:  01/14/2022  7.  Pt will demo R shoulder flexion and scaption AROM to at least 100 deg in standing without significant shoulder hike for improved UE elevation. Baseline:  Goal status: ongoing Target date:  02/18/2022  8.  Pt will be able to perform his self care activities with no > than minimal difficulty.   Baseline:  Goal status: ongoing Target date: 02/25/2022       LONG TERM GOALS:   Pt will demo R shoulder AROM to be [facility] t/o for performance of ADLs and IADLs.  Baseline:  Goal status: INITIAL Target date:  03/25/2022   2.  Pt will be  able to perform his ADLs and IADLs without significant difficulty and pain. Baseline:  Goal status: INITIAL Target date:  03/25/2022  3.  Pt will be able to perform his normal reaching and overhead activities without significant pain and limitation. Baseline:  Goal status: INITIAL Target date:  03/25/2022  4.  Pt will demo at least 4/5 MMT strength t/o R shoulder for improved tolerance with and performance of daily activities, household chores, and functional  lifting.  Baseline:  Goal status: INITIAL Target Date:  03/25/2022  5.  Pt will be independent with advanced HEP for improved shoulder strength and stability to assist with returning to desired level of function and performing functional carrying and lifting. Baseline:  Goal status: INITIAL    PLAN: PT FREQUENCY:  1x/wk x 4 weeks, 1-2x/wk x 2 weeks, and 2x/wk x 10 weeks  PT DURATION: other: 16 weeks  PLANNED INTERVENTIONS: Therapeutic exercises, Therapeutic activity, Neuromuscular re-education, Patient/Family education, Self Care, Joint mobilization, Aquatic Therapy, Dry Needling, Electrical stimulation, Spinal mobilization, Cryotherapy, Moist heat, scar mobilization, Taping, Ultrasound, Manual therapy, and Re-evaluation  PLAN FOR NEXT SESSION:  Cont with shoulder PROM per Dr. Eddie Dibbles protocol.  Pt had a subscapularis repair also, will use subscapularis repair protocol   Daleen Bo PT, DPT 01/16/22 10:59 AM

## 2022-01-16 NOTE — Progress Notes (Signed)
Post Operative Evaluation    Procedure/Date of Surgery: Right shoulder rotator cuff repair with biceps tenodesis 11/28/21  Interval History:   01/16/2022: Presents today for follow-up 7 weeks status post right shoulder rotator cuff repair and biceps tenodesis.  He has been progressing through the rehab protocol.  He has been working on passive range of motion.  He does have some tightness and stiffness in the shoulder with some soreness after working through PT.  He is here today for further assessment.   PMH/PSH/Family History/Social History/Meds/Allergies:    Past Medical History:  Diagnosis Date   Aortic stenosis    CAD (coronary artery disease), native coronary artery 12/26/2016   DES LAD & RCA, EF 25-30%   CHF (congestive heart failure) (HCC)    Dyslipidemia    Hypertension    Obesity    Osteoarthritis    Pre-diabetes    a. prior A1C 5.6, states he was on meds for this at one point.   Past Surgical History:  Procedure Laterality Date   CORONARY STENT INTERVENTION N/A 12/29/2016   Procedure: CORONARY STENT INTERVENTION;  Surgeon: Jettie Booze, MD;  Location: [facility];  Service: Cardiovascular;  Laterality: N/A;   RIGHT/LEFT HEART CATH AND CORONARY ANGIOGRAPHY N/A 12/29/2016   Procedure: RIGHT/LEFT HEART CATH AND CORONARY ANGIOGRAPHY;  Surgeon: Jettie Booze, MD;  Location: [place] [facility];  Service: Cardiovascular;  Laterality: N/A;   SHOULDER ARTHROSCOPY WITH ROTATOR CUFF REPAIR AND OPEN BICEPS TENODESIS Right 11/28/2021   Procedure: RIGHT SHOULDER ARTHROSCOPY WITH ROTATOR CUFF REPAIR AND OPEN BICEPS TENODESIS;  Surgeon: Vanetta Mulders, MD;  Location: [place];  Service: Orthopedics;  Laterality: Right;   TONSILLECTOMY     TOTAL HIP ARTHROPLASTY Right 04/26/2018   Procedure: RIGHT TOTAL HIP ARTHROPLASTY ANTERIOR APPROACH;  Surgeon: Leandrew Koyanagi, MD;  Location: [place];  Service: Orthopedics;  Laterality: Right;   TOTAL  HIP ARTHROPLASTY Left 11/15/2018   TOTAL HIP ARTHROPLASTY Left 11/15/2018   Procedure: LEFT TOTAL HIP ARTHROPLASTY ANTERIOR APPROACH;  Surgeon: Leandrew Koyanagi, MD;  Location: [place];  Service: Orthopedics;  Laterality: Left;   Social History   Socioeconomic History   Marital status: Single    Spouse name: Not on file   Number of children: Not on file   Years of education: Not on file   Highest education level: Not on file  Occupational History   Not on file  Tobacco Use   Smoking status: Former   Smokeless tobacco: Never   Tobacco comments:    Smoked lightly for approx 20 years, quit 1990s  Vaping Use   Vaping Use: Never used  Substance and Sexual Activity   Alcohol use: Not Currently   Drug use: Yes    Types: Marijuana    Comment: every night (to sleep)   Sexual activity: Not on file  Other Topics Concern   Not on file  Social History Narrative   Lives Home alone. Sister makes all health related decisions. Declined Advanced directive at this time   Social Determinants of Health   Financial Resource Strain: Not on file  Food Insecurity: Not on file  Transportation Needs: Not on file  Physical Activity: Not on file  Stress: Not on file  Social Connections: Not on file   Family History  Problem Relation  Age of Onset   CAD Father        MI/CABG age 27   Allergies  Allergen Reactions   Statins Other (See Comments)    Elevated LFTs   Current Outpatient Medications  Medication Sig Dispense Refill   amLODipine (NORVASC) 5 MG tablet Take 1 tablet (5 mg total) by mouth daily. 90 tablet 0   aspirin EC 325 MG tablet TAKE 1 TABLET BY MOUTH EVERY DAY 30 tablet 0   carvedilol (COREG) 3.125 MG tablet Take 1 tablet (3.125 mg total) by mouth 2 (two) times daily. 180 tablet 2   Evolocumab (REPATHA SURECLICK) 734 MG/ML SOAJ Inject 1 mL into the skin every 14 (fourteen) days. 6 mL 3   fluticasone (FLONASE) 50 MCG/ACT nasal spray PLACE 1 SPRAY INTO BOTH NOSTRILS DAILY AS NEEDED FOR  ALLERGIES OR RHINITIS. 16 mL 0   ibuprofen (ADVIL) 800 MG tablet TAKE 1 TABLET BY MOUTH EVERY 8 HOURS FOR 10 DAYS. TAKE WITH FOOD, ALTERNATE WITH ACETAMINOPHEN 30 tablet 0   lisinopril (ZESTRIL) 10 MG tablet TAKE 1 TABLET BY MOUTH TWICE A DAY 180 tablet 0   loratadine (CLARITIN) 10 MG tablet Take 10 mg by mouth daily.     methocarbamol (ROBAXIN) 500 MG tablet Take 1 tablet (500 mg total) by mouth 2 (two) times daily as needed for muscle spasms. 20 tablet 2   Multiple Vitamins-Minerals (PRESERVISION AREDS 2) CAPS Take 1 capsule by mouth 2 (two) times daily.     oxyCODONE (ROXICODONE) 5 MG immediate release tablet Take 1 tablet (5 mg total) by mouth every 4 (four) hours as needed for severe pain. 30 tablet 0   potassium chloride (KLOR-CON) 10 MEQ tablet TAKE 1 TABLET (10 MEQ TOTAL) BY MOUTH DAILY. DX: HYPOKALEMIA 90 tablet 1   No current facility-administered medications for this visit.   No results found.  Review of Systems:   A ROS was performed including pertinent positives and negatives as documented in the HPI.   Musculoskeletal Exam:    There were no vitals taken for this visit.  Right shoulder incisions are well-appearing.  No erythema or drainage.  Passive assisted forward elevation in the supine position is to 145 degrees.  External rotation at the side is to 45 degrees.  I internal rotation is to L1 compared to L5 on the contralateral side.  Remainder of distal neurosensory exam is intact  Imaging:      I personally reviewed and interpreted the radiographs.   Assessment:   7 weeks status post right shoulder rotator cuff repair and biceps tenodesis overall doing well.  At this time I have advised him according to the rehab protocol.  I will plan to see him back in 6 weeks for reassessment.  All precautions and restrictions were specifically discussed  Plan :    -Return to clinic in 6 weeks      I personally saw and evaluated the patient, and participated in the  management and treatment plan.  Vanetta Mulders, MD Attending Physician, Orthopedic Surgery  This document was dictated using Dragon voice recognition software. A reasonable attempt at proof reading has been made to minimize errors.

## 2022-01-23 ENCOUNTER — Encounter: Payer: Self-pay | Admitting: Cardiovascular Disease

## 2022-01-23 ENCOUNTER — Ambulatory Visit (HOSPITAL_BASED_OUTPATIENT_CLINIC_OR_DEPARTMENT_OTHER): Payer: Medicaid Other | Admitting: Physical Therapy

## 2022-01-23 ENCOUNTER — Encounter (HOSPITAL_BASED_OUTPATIENT_CLINIC_OR_DEPARTMENT_OTHER): Payer: Self-pay | Admitting: Physical Therapy

## 2022-01-23 DIAGNOSIS — M25611 Stiffness of right shoulder, not elsewhere classified: Secondary | ICD-10-CM

## 2022-01-23 DIAGNOSIS — M6281 Muscle weakness (generalized): Secondary | ICD-10-CM

## 2022-01-23 DIAGNOSIS — M25511 Pain in right shoulder: Secondary | ICD-10-CM
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
## 2022-01-30 ENCOUNTER — Ambulatory Visit (HOSPITAL_BASED_OUTPATIENT_CLINIC_OR_DEPARTMENT_OTHER): Payer: Medicaid Other | Admitting: Physical Therapy

## 2022-01-30 ENCOUNTER — Encounter (HOSPITAL_BASED_OUTPATIENT_CLINIC_OR_DEPARTMENT_OTHER): Payer: Self-pay | Admitting: Physical Therapy

## 2022-01-30 DIAGNOSIS — M25611 Stiffness of right shoulder, not elsewhere classified: Secondary | ICD-10-CM | POA: Diagnosis not present

## 2022-01-30 DIAGNOSIS — M25511 Pain in right shoulder: Secondary | ICD-10-CM

## 2022-01-30 DIAGNOSIS — M6281 Muscle weakness (generalized): Secondary | ICD-10-CM

## 2022-01-30 NOTE — Therapy (Signed)
OUTPATIENT PHYSICAL THERAPY TREATMENT NOTE   Patient Name: Joshua [name] MRN: [id number] DOB:October 13, 1960, 61 y.o., male Today's Date: 01/30/2022   PT End of Session - 01/30/22 1045     Visit Number 11    Number of Visits 26    Date for PT Re-Evaluation 02/25/22    Authorization Type [employer] MCD    PT Start Time 1020   arrives late   PT Stop Time 1055    PT Time Calculation (min) 35 min    Activity Tolerance Patient tolerated treatment well    Behavior During Therapy [facility] for tasks assessed/performed                    Past Medical History:  Diagnosis Date   Aortic stenosis    CAD (coronary artery disease), native coronary artery 12/26/2016   DES LAD & RCA, EF 25-30%   CHF (congestive heart failure) (HCC)    Dyslipidemia    Hypertension    Obesity    Osteoarthritis    Pre-diabetes    a. prior A1C 5.6, states he was on meds for this at one point.   Past Surgical History:  Procedure Laterality Date   CORONARY STENT INTERVENTION N/A 12/29/2016   Procedure: CORONARY STENT INTERVENTION;  Surgeon: Jettie Booze, MD;  Location: [place] [facility];  Service: Cardiovascular;  Laterality: N/A;   RIGHT/LEFT HEART CATH AND CORONARY ANGIOGRAPHY N/A 12/29/2016   Procedure: RIGHT/LEFT HEART CATH AND CORONARY ANGIOGRAPHY;  Surgeon: Jettie Booze, MD;  Location: [place] [facility];  Service: Cardiovascular;  Laterality: N/A;   SHOULDER ARTHROSCOPY WITH ROTATOR CUFF REPAIR AND OPEN BICEPS TENODESIS Right 11/28/2021   Procedure: RIGHT SHOULDER ARTHROSCOPY WITH ROTATOR CUFF REPAIR AND OPEN BICEPS TENODESIS;  Surgeon: Vanetta Mulders, MD;  Location: [place];  Service: Orthopedics;  Laterality: Right;   TONSILLECTOMY     TOTAL HIP ARTHROPLASTY Right 04/26/2018   Procedure: RIGHT TOTAL HIP ARTHROPLASTY ANTERIOR APPROACH;  Surgeon: Leandrew Koyanagi, MD;  Location: [place];  Service: Orthopedics;  Laterality: Right;   TOTAL HIP ARTHROPLASTY Left 11/15/2018   TOTAL HIP ARTHROPLASTY  Left 11/15/2018   Procedure: LEFT TOTAL HIP ARTHROPLASTY ANTERIOR APPROACH;  Surgeon: Leandrew Koyanagi, MD;  Location: [place];  Service: Orthopedics;  Laterality: Left;   Patient Active Problem List   Diagnosis Date Noted   Biceps tendinitis of right upper extremity    Rotator cuff syndrome of right shoulder 10/16/2021   Other insomnia 04/30/2021   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   COVID-19 vaccine regimen to maintain immunity completed 04/27/2020   Statin intolerance 12/10/2018   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/15/2018   Chronic left shoulder pain 06/08/2018   Status post total hip replacement, right 04/26/2018   Mild dilation of ascending aorta ([place]) 12/26/2017   Dyslipidemia 12/26/2017   Bilateral carotid bruits 12/26/2017   Nocturnal hypoxemia 06/29/2017   Primary osteoarthritis of both hips 06/29/2017   Chronic diastolic heart failure ([place])    Coronary artery disease involving native coronary artery of native heart without angina pectoris 03/31/2017   Ischemic cardiomyopathy 03/31/2017   Marijuana use 03/31/2017   Prediabetes 03/31/2017   Aortic valve insufficiency 12/28/2016   Mitral valve regurgitation 12/28/2016   Obesity 12/28/2016   Aortic valve stenosis, nonrheumatic    Essential hypertension     REFERRING PROVIDER: Vanetta Mulders, MD  REFERRING DIAG: M75.101 (ICD-10-CM) - Rotator cuff syndrome of right shoulder   THERAPY DIAG:  Stiffness  of right shoulder, not elsewhere classified  Right shoulder pain, unspecified chronicity  Muscle weakness (generalized)  Rationale for Evaluation and Treatment Rehabilitation  ONSET DATE: DOS 11/29/2019  Days since surgery: 63   SUBJECTIVE:                                                                                                                                                                                      SUBJECTIVE STATEMENT: Pt is 9 weeks s/p Arthroscopic supraspinatus and  subscapularis repairs, biceps tenodesis, debridement of labrum, and subacromial decompression.    Pt states the arm is sore from him using the pulley's "in all directions." He states he as been focusing more on the "range and less with the straps."  PERTINENT HISTORY: -supraspinatus and subscapularis repairs, biceps tenodesis, debridement of labrum, and subacromial decompression on 11/28/21 -L shoulder pain, CHF with coronary Stent placement, R and L THA in 2020, OA   PAIN:  Are you having pain? No NPRS:  Current:  0/10, Best:  3/10, Worst:  9/10 Location:  R shoulder NPRS:  5/10 Location: L shoulder   PRECAUTIONS: Other: per surgical protocol, PROM  WEIGHT BEARING RESTRICTIONS Yes R UE  FALLS:  Has patient fallen in last 6 months? 1 fall, this injury  LIVING ENVIRONMENT: Lives with: lives alone   OCCUPATION: He is retired and on disability.   PLOF: Independent; Pt was able to perform his ADLs and IADLs and reaching activities independently.  PATIENT GOALS Reach top shelf, reach behind head.  OBJECTIVE:   DIAGNOSTIC FINDINGS:  Pt had x rays and MRI prior to surgery.  UPPER EXTREMITY ROM:    Active ROM Right eval Left eval R  10/12  Shoulder flexion   154 145  Shoulder extension       Shoulder abduction   146 95/ AAROM 130  Shoulder adduction       Shoulder internal rotation     BHB reach to L2  Shoulder external rotation     BHB reach to C3  (Blank rows = not tested)   R shoulder PROM not tested due to time constraints.    UPPER EXTREMITY MMT:   Not tested due to healing constraints/surgical protocol   TODAY'S TREATMENT:   Manual Therapy:   -Joint mob: LAD grade II with oscillations  -R AP and Inf GH jt mobs grade III   PROM:  165 deg flex, 155 BD, ER to 25 ; IR to belly  Exercises Supine flexion 2x8 1lb S/L ER 1lb 2x10 Wall slide flexion and ABD 10x  Wall crawl ABD 10x  Scaption 2x10 Rowing RTB 2x10 RTB shoulder ext 10x  PATIENT  EDUCATION: Education details: anatomy, exercise progression, DOMS expectations, muscle firing,  envelope of function, HEP, POC  Person educated: Patient Education method: Customer service manager Education comprehension: verbalized understanding, returned demonstration, and needs further education   HOME EXERCISE PROGRAM: Access Code: [email or web address] URL: [email or web address] Date: 12/11/2021 Prepared by: Ronny Flurry  ASSESSMENT:  CLINICAL IMPRESSION: Pt reports mixed compliance with strengthening exercise but has been focusing on ROM. Pt still limited with full AROM at this time and was advised to start using resistance bands at home for exercise to strengthen R shoulder. Pt with expected muscle tightness during session and no pain during session. Pt is most limited in AROM, but able to get to 150 with pulley or wall support. Plan to continue with AROM progression at future visits. Pt should benefit from skilled PT services per protocol to address impairments and to assist in restoring desired level of function.      OBJECTIVE IMPAIRMENTS decreased activity tolerance, decreased endurance, decreased ROM, decreased strength, hypomobility, impaired flexibility, impaired UE functional use, and pain.   ACTIVITY LIMITATIONS carrying, lifting, sleeping, bathing, dressing, reach over head, and hygiene/grooming  PARTICIPATION LIMITATIONS: meal prep, cleaning, laundry, driving, shopping, and community activity  PERSONAL FACTORS 1-2 comorbidities: L shoulder pain and OA  are also affecting patient's functional outcome.   REHAB POTENTIAL: Good  CLINICAL DECISION MAKING: Stable/uncomplicated  EVALUATION COMPLEXITY: Low   GOALS:   SHORT TERM GOALS: Target date: 12/31/2021  Pt will be independent and compliant with HEP for improved pain, ROM, and strength.  Baseline: Goal status: MET Target date: 12/31/2021    2.  Pt will tolerate PROM per protocol without adverse effects  for improved ROM.  Baseline:  Goal status: MET Target date:  12/24/2021  3.  Pt will demo PROM to 110-120 deg in flexion and 0 deg in ER for improved stiffness and mobility.  Baseline:  Goal status: MET  4.  Pt will tolerate AAROM without adverse effects for progression of protocol.  Baseline:  Goal status: ongoing  5. Pt will demo supine shoulder AAROM to be 120 deg in flexion and in 40 deg in ER for progression of AAROM and protocol Baseline:  Goal status: ongoing Target date:  01/21/2022   6.   Pt will wean out of sling per MD allowance without adverse effects Baseline:  Goal status: ongoing Target date:  01/14/2022  7.  Pt will demo R shoulder flexion and scaption AROM to at least 100 deg in standing without significant shoulder hike for improved UE elevation. Baseline:  Goal status: ongoing Target date:  02/18/2022  8.  Pt will be able to perform his self care activities with no > than minimal difficulty.   Baseline:  Goal status: ongoing Target date: 02/25/2022       LONG TERM GOALS:   Pt will demo R shoulder AROM to be [facility] t/o for performance of ADLs and IADLs.  Baseline:  Goal status: ongoing Target date:  03/25/2022   2.  Pt will be able to perform his ADLs and IADLs without significant difficulty and pain. Baseline:  Goal status: ongoing Target date:  03/25/2022  3.  Pt will be able to perform his normal reaching and overhead activities without significant pain and limitation. Baseline:  Goal status: ongoing Target date:  03/25/2022  4.  Pt will demo at least 4/5 MMT strength t/o R shoulder for improved tolerance with and performance of daily activities, household chores, and functional lifting.  Baseline:  Goal status: ongoing Target  Date:  03/25/2022  5.  Pt will be independent with advanced HEP for improved shoulder strength and stability to assist with returning to desired level of function and performing functional carrying and  lifting. Baseline:  Goal status: ongoing    PLAN: PT FREQUENCY:  1x/wk x 4 weeks, 1-2x/wk x 2 weeks, and 2x/wk x 10 weeks  PT DURATION: other: 16 weeks  PLANNED INTERVENTIONS: Therapeutic exercises, Therapeutic activity, Neuromuscular re-education, Patient/Family education, Self Care, Joint mobilization, Aquatic Therapy, Dry Needling, Electrical stimulation, Spinal mobilization, Cryotherapy, Moist heat, scar mobilization, Taping, Ultrasound, Manual therapy, and Re-evaluation  PLAN FOR NEXT SESSION:  Cont with shoulder PROM per Dr. Eddie Dibbles protocol.  Pt had a subscapularis repair also, will use subscapularis repair protocol   Daleen Bo PT, DPT 01/30/22 12:18 PM

## 2022-02-01 ENCOUNTER — Other Ambulatory Visit: Payer: Self-pay | Admitting: Internal Medicine

## 2022-02-01 DIAGNOSIS — I2510 Atherosclerotic heart disease of native coronary artery without angina pectoris: Secondary | ICD-10-CM

## 2022-02-06 ENCOUNTER — Other Ambulatory Visit: Payer: Self-pay | Admitting: Internal Medicine

## 2022-02-06 ENCOUNTER — Encounter (HOSPITAL_BASED_OUTPATIENT_CLINIC_OR_DEPARTMENT_OTHER): Payer: Self-pay

## 2022-02-06 ENCOUNTER — Ambulatory Visit (HOSPITAL_BASED_OUTPATIENT_CLINIC_OR_DEPARTMENT_OTHER): Payer: Medicaid Other | Admitting: Physical Therapy

## 2022-02-06 ENCOUNTER — Other Ambulatory Visit (HOSPITAL_BASED_OUTPATIENT_CLINIC_OR_DEPARTMENT_OTHER): Payer: Self-pay | Admitting: Orthopaedic Surgery

## 2022-02-06 DIAGNOSIS — R0981 Nasal congestion: Secondary | ICD-10-CM

## 2022-02-13 ENCOUNTER — Ambulatory Visit (HOSPITAL_BASED_OUTPATIENT_CLINIC_OR_DEPARTMENT_OTHER): Payer: Medicaid Other | Attending: Orthopaedic Surgery | Admitting: Physical Therapy

## 2022-02-13 ENCOUNTER — Encounter (HOSPITAL_BASED_OUTPATIENT_CLINIC_OR_DEPARTMENT_OTHER): Payer: Self-pay | Admitting: Physical Therapy

## 2022-02-13 DIAGNOSIS — M25511 Pain in right shoulder: Secondary | ICD-10-CM | POA: Diagnosis present

## 2022-02-13 DIAGNOSIS — M25611 Stiffness of right shoulder, not elsewhere classified: Secondary | ICD-10-CM | POA: Diagnosis not present

## 2022-02-13 DIAGNOSIS — M6281 Muscle weakness (generalized): Secondary | ICD-10-CM | POA: Insufficient documentation

## 2022-02-13 NOTE — Therapy (Signed)
OUTPATIENT PHYSICAL THERAPY TREATMENT NOTE   Patient Name: Joshua [name] MRN: [id number] DOB:12-Mar-1961, 61 y.o., male Today's Date: 02/13/2022   PT End of Session - 02/13/22 1027     Visit Number 12    Number of Visits 26    Date for PT Re-Evaluation 02/25/22    Authorization Type [employer] MCD    PT Start Time 1016    PT Stop Time 1050    PT Time Calculation (min) 34 min    Activity Tolerance Patient tolerated treatment well    Behavior During Therapy WFL for tasks assessed/performed                     Past Medical History:  Diagnosis Date   Aortic stenosis    CAD (coronary artery disease), native coronary artery 12/26/2016   DES LAD & RCA, EF 25-30%   CHF (congestive heart failure) (HCC)    Dyslipidemia    Hypertension    Obesity    Osteoarthritis    Pre-diabetes    a. prior A1C 5.6, states he was on meds for this at one point.   Past Surgical History:  Procedure Laterality Date   CORONARY STENT INTERVENTION N/A 12/29/2016   Procedure: CORONARY STENT INTERVENTION;  Surgeon: Jettie Booze, MD;  Location: [facility];  Service: Cardiovascular;  Laterality: N/A;   RIGHT/LEFT HEART CATH AND CORONARY ANGIOGRAPHY N/A 12/29/2016   Procedure: RIGHT/LEFT HEART CATH AND CORONARY ANGIOGRAPHY;  Surgeon: Jettie Booze, MD;  Location: [facility];  Service: Cardiovascular;  Laterality: N/A;   SHOULDER ARTHROSCOPY WITH ROTATOR CUFF REPAIR AND OPEN BICEPS TENODESIS Right 11/28/2021   Procedure: RIGHT SHOULDER ARTHROSCOPY WITH ROTATOR CUFF REPAIR AND OPEN BICEPS TENODESIS;  Surgeon: Vanetta Mulders, MD;  Location: [facility];  Service: Orthopedics;  Laterality: Right;   TONSILLECTOMY     TOTAL HIP ARTHROPLASTY Right 04/26/2018   Procedure: RIGHT TOTAL HIP ARTHROPLASTY ANTERIOR APPROACH;  Surgeon: Leandrew Koyanagi, MD;  Location: [facility];  Service: Orthopedics;  Laterality: Right;   TOTAL HIP ARTHROPLASTY Left 11/15/2018   TOTAL HIP ARTHROPLASTY Left 11/15/2018    Procedure: LEFT TOTAL HIP ARTHROPLASTY ANTERIOR APPROACH;  Surgeon: Leandrew Koyanagi, MD;  Location: [facility];  Service: Orthopedics;  Laterality: Left;   Patient Active Problem List   Diagnosis Date Noted   Biceps tendinitis of right upper extremity    Rotator cuff syndrome of right shoulder 10/16/2021   Other insomnia 04/30/2021   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   COVID-19 vaccine regimen to maintain immunity completed 04/27/2020   Statin intolerance 12/10/2018   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/15/2018   Chronic left shoulder pain 06/08/2018   Status post total hip replacement, right 04/26/2018   Mild dilation of ascending aorta ([place]) 12/26/2017   Dyslipidemia 12/26/2017   Bilateral carotid bruits 12/26/2017   Nocturnal hypoxemia 06/29/2017   Primary osteoarthritis of both hips 06/29/2017   Chronic diastolic heart failure ([place])    Coronary artery disease involving native coronary artery of native heart without angina pectoris 03/31/2017   Ischemic cardiomyopathy 03/31/2017   Marijuana use 03/31/2017   Prediabetes 03/31/2017   Aortic valve insufficiency 12/28/2016   Mitral valve regurgitation 12/28/2016   Obesity 12/28/2016   Aortic valve stenosis, nonrheumatic    Essential hypertension     REFERRING PROVIDER: Vanetta Mulders, MD  REFERRING DIAG: M75.101 (ICD-10-CM) - Rotator cuff syndrome of right shoulder   THERAPY DIAG:  Stiffness of right  shoulder, not elsewhere classified  Muscle weakness (generalized)  Right shoulder pain, unspecified chronicity  Rationale for Evaluation and Treatment Rehabilitation  ONSET DATE: DOS 11/29/2019  Days since surgery: 77   SUBJECTIVE:                                                                                                                                                                                      SUBJECTIVE STATEMENT: Pt is 10 weeks s/p Arthroscopic supraspinatus and subscapularis repairs,  biceps tenodesis, debridement of labrum, and subacromial decompression.    Pt states the arm is sore from him using the arm more at home.   PERTINENT HISTORY: -supraspinatus and subscapularis repairs, biceps tenodesis, debridement of labrum, and subacromial decompression on 11/28/21 -L shoulder pain, CHF with coronary Stent placement, R and L THA in 2020, OA   PAIN:  Are you having pain? No NPRS:  Current:  0/10, Best:  3/10, Worst:  9/10 Location:  R shoulder NPRS:  5/10 Location: L shoulder   PRECAUTIONS: Other: per surgical protocol, PROM  WEIGHT BEARING RESTRICTIONS Yes R UE  FALLS:  Has patient fallen in last 6 months? 1 fall, this injury  LIVING ENVIRONMENT: Lives with: lives alone   OCCUPATION: He is retired and on disability.   PLOF: Independent; Pt was able to perform his ADLs and IADLs and reaching activities independently.  PATIENT GOALS Reach top shelf, reach behind head.  OBJECTIVE:   DIAGNOSTIC FINDINGS:  Pt had x rays and MRI prior to surgery.  UPPER EXTREMITY ROM:    Active ROM Right eval Left eval R  10/12  Shoulder flexion   154 145  Shoulder extension       Shoulder abduction   146 95/ AAROM 130  Shoulder adduction       Shoulder internal rotation     BHB reach to L2  Shoulder external rotation     BHB reach to C3  (Blank rows = not tested)   R shoulder PROM not tested due to time constraints.    UPPER EXTREMITY MMT:   Not tested due to healing constraints/surgical protocol   TODAY'S TREATMENT:   Manual Therapy:  -R AP and Inf GH jt mobs grade III   AROM:  160 deg flex, 145 ABD, ER to 35 ; IR to belly  Exercises Supine flexion 3x8 1lb S/L ER 3lb 2x10 Sidelying ABD 3x8 Shoulder G TB extension 3x10 GTB row 3x10 ABD wall slide 2x10  PATIENT EDUCATION: Education details: anatomy, exercise progression, DOMS expectations, muscle firing,  envelope of function, HEP, POC  Person educated: Patient Education method: Explanation and  Demonstration Education comprehension: verbalized understanding, returned demonstration,  and needs further education   HOME EXERCISE PROGRAM: Access Code: [email or web address] URL: [email or web address] Date: 12/11/2021 Prepared by: Ronny Flurry  ASSESSMENT:  CLINICAL IMPRESSION: Pt reports mixed compliance with strengthening exercise but has been focusing on ROM. Pt able to progress strengthening today without pain. Pt requires heavy VC and TC for positioning of the scapular and shoulder with exercise. HEP trimmed to improve compliance. Pt advised that ROM is important but now he should transition to strengthening of the cuff. Plan to continue with strength as tolerated at future sessions. Pt with missed visits in therapy recently affecting progression and POC. Pt should benefit from skilled PT services per protocol to address impairments and to assist in restoring desired level of function.      OBJECTIVE IMPAIRMENTS decreased activity tolerance, decreased endurance, decreased ROM, decreased strength, hypomobility, impaired flexibility, impaired UE functional use, and pain.   ACTIVITY LIMITATIONS carrying, lifting, sleeping, bathing, dressing, reach over head, and hygiene/grooming  PARTICIPATION LIMITATIONS: meal prep, cleaning, laundry, driving, shopping, and community activity  PERSONAL FACTORS 1-2 comorbidities: L shoulder pain and OA  are also affecting patient's functional outcome.   REHAB POTENTIAL: Good  CLINICAL DECISION MAKING: Stable/uncomplicated  EVALUATION COMPLEXITY: Low   GOALS:   SHORT TERM GOALS: Target date: 12/31/2021  Pt will be independent and compliant with HEP for improved pain, ROM, and strength.  Baseline: Goal status: MET Target date: 12/31/2021    2.  Pt will tolerate PROM per protocol without adverse effects for improved ROM.  Baseline:  Goal status: MET Target date:  12/24/2021  3.  Pt will demo PROM to 110-120 deg in flexion and 0 deg in  ER for improved stiffness and mobility.  Baseline:  Goal status: MET  4.  Pt will tolerate AAROM without adverse effects for progression of protocol.  Baseline:  Goal status: ongoing  5. Pt will demo supine shoulder AAROM to be 120 deg in flexion and in 40 deg in ER for progression of AAROM and protocol Baseline:  Goal status: ongoing Target date:  01/21/2022   6.   Pt will wean out of sling per MD allowance without adverse effects Baseline:  Goal status: ongoing Target date:  01/14/2022  7.  Pt will demo R shoulder flexion and scaption AROM to at least 100 deg in standing without significant shoulder hike for improved UE elevation. Baseline:  Goal status: ongoing Target date:  02/18/2022  8.  Pt will be able to perform his self care activities with no > than minimal difficulty.   Baseline:  Goal status: ongoing Target date: 02/25/2022       LONG TERM GOALS:   Pt will demo R shoulder AROM to be [facility] t/o for performance of ADLs and IADLs.  Baseline:  Goal status: ongoing Target date:  03/25/2022   2.  Pt will be able to perform his ADLs and IADLs without significant difficulty and pain. Baseline:  Goal status: ongoing Target date:  03/25/2022  3.  Pt will be able to perform his normal reaching and overhead activities without significant pain and limitation. Baseline:  Goal status: ongoing Target date:  03/25/2022  4.  Pt will demo at least 4/5 MMT strength t/o R shoulder for improved tolerance with and performance of daily activities, household chores, and functional lifting.  Baseline:  Goal status: ongoing Target Date:  03/25/2022  5.  Pt will be independent with advanced HEP for improved shoulder strength and stability to assist with returning to desired level of function and  performing functional carrying and lifting. Baseline:  Goal status: ongoing    PLAN: PT FREQUENCY:  1x/wk x 4 weeks, 1-2x/wk x 2 weeks, and 2x/wk x 10 weeks  PT DURATION: other: 16  weeks  PLANNED INTERVENTIONS: Therapeutic exercises, Therapeutic activity, Neuromuscular re-education, Patient/Family education, Self Care, Joint mobilization, Aquatic Therapy, Dry Needling, Electrical stimulation, Spinal mobilization, Cryotherapy, Moist heat, scar mobilization, Taping, Ultrasound, Manual therapy, and Re-evaluation  PLAN FOR NEXT SESSION:  Cont with shoulder PROM per Dr. Eddie Dibbles protocol.  Pt had a subscapularis repair also, will use subscapularis repair protocol   Daleen Bo PT, DPT 02/13/22 10:56 AM

## 2022-02-16 ENCOUNTER — Other Ambulatory Visit: Payer: Self-pay | Admitting: Internal Medicine

## 2022-02-16 DIAGNOSIS — I10 Essential (primary) hypertension: Secondary | ICD-10-CM

## 2022-02-17 NOTE — Telephone Encounter (Signed)
Requested Prescriptions  Pending Prescriptions Disp Refills   lisinopril (ZESTRIL) 10 MG tablet [Pharmacy Med Name: LISINOPRIL 10 MG TABLET] 180 tablet 1    Sig: TAKE 1 TABLET BY MOUTH TWICE A DAY     Cardiovascular:  ACE Inhibitors Passed - 02/16/2022  1:25 PM      Passed - Cr in normal range and within 180 days    Creatinine, Ser  Date Value Ref Range Status  11/28/2021 0.91 0.61 - 1.24 mg/dL Final         Passed - K in normal range and within 180 days    Potassium  Date Value Ref Range Status  11/28/2021 4.1 3.5 - 5.1 mmol/L Final         Passed - Patient is not pregnant      Passed - Last BP in normal range    BP Readings from Last 1 Encounters:  01/09/22 103/69         Passed - Valid encounter within last 6 months    Recent Outpatient Visits           1 month ago Essential hypertension   [place], Deborah B, MD   5 months ago Essential hypertension   [place] Ladell Pier, MD   9 months ago Essential hypertension   [place] Ladell Pier, MD   1 year ago Essential hypertension   [place] Ladell Pier, MD   1 year ago Essential hypertension   [place], MD               amLODipine (NORVASC) 5 MG tablet [Pharmacy Med Name: AMLODIPINE BESYLATE 5 MG TAB] 90 tablet 0    Sig: TAKE 1 TABLET (5 MG TOTAL) BY MOUTH DAILY.     Cardiovascular: Calcium Channel Blockers 2 Passed - 02/16/2022  1:25 PM      Passed - Last BP in normal range    BP Readings from Last 1 Encounters:  01/09/22 103/69         Passed - Last Heart Rate in normal range    Pulse Readings from Last 1 Encounters:  01/09/22 74         Passed - Valid encounter within last 6 months    Recent Outpatient Visits           1 month ago Essential hypertension   [place] Ladell Pier, MD   5 months ago Essential hypertension   [place] Ladell Pier, MD   9 months ago Essential hypertension   [place] Ladell Pier, MD   1 year ago Essential hypertension   [place] Ladell Pier, MD   1 year ago Essential hypertension   [place] Ladell Pier, MD

## 2022-02-27 ENCOUNTER — Ambulatory Visit (INDEPENDENT_AMBULATORY_CARE_PROVIDER_SITE_OTHER): Payer: Medicaid Other

## 2022-02-27 ENCOUNTER — Ambulatory Visit (INDEPENDENT_AMBULATORY_CARE_PROVIDER_SITE_OTHER): Payer: Medicaid Other | Admitting: Orthopaedic Surgery

## 2022-02-27 DIAGNOSIS — M25512 Pain in left shoulder: Secondary | ICD-10-CM | POA: Diagnosis not present

## 2022-02-27 DIAGNOSIS — G8929 Other chronic pain: Secondary | ICD-10-CM

## 2022-02-27 DIAGNOSIS — M19042 Primary osteoarthritis, left hand: Secondary | ICD-10-CM | POA: Diagnosis not present

## 2022-02-27 NOTE — Progress Notes (Signed)
Post Operative Evaluation    Procedure/Date of Surgery: Right shoulder rotator cuff repair with biceps tenodesis 11/28/21  Interval History:    02/27/2022: Presents today for 36-monthfollow-up for his right shoulder rotator cuff.  Overall he is continuing to improve.  Overhead strength and motion is dramatically improved.  He no longer has pain while he is sleeping.  With regard to the left shoulder he states that this is actually now more painful than his right shoulder.  He does have pain with laying directly on the side was precludes him from sleeping on the side.  He has been working in therapy for the left shoulder as well without any significant relief.  At this time he is concerned about a rotator cuff tear on this side as well.  He does endorse weakness and pain with overhead activity on the side.   PMH/PSH/Family History/Social History/Meds/Allergies:    Past Medical History:  Diagnosis Date   Aortic stenosis    CAD (coronary artery disease), native coronary artery 12/26/2016   DES LAD & RCA, EF 25-30%   CHF (congestive heart failure) (HCC)    Dyslipidemia    Hypertension    Obesity    Osteoarthritis    Pre-diabetes    a. prior A1C 5.6, states he was on meds for this at one point.   Past Surgical History:  Procedure Laterality Date   CORONARY STENT INTERVENTION N/A 12/29/2016   Procedure: CORONARY STENT INTERVENTION;  Surgeon: VJettie Booze MD;  [facility];  Service: Cardiovascular;  Laterality: N/A;   RIGHT/LEFT HEART CATH AND CORONARY ANGIOGRAPHY N/A 12/29/2016   Procedure: RIGHT/LEFT HEART CATH AND CORONARY ANGIOGRAPHY;  Surgeon: VJettie Booze MD;  [facility];  Service: Cardiovascular;  Laterality: N/A;   SHOULDER ARTHROSCOPY WITH ROTATOR CUFF REPAIR AND OPEN BICEPS TENODESIS Right 11/28/2021   Procedure: RIGHT SHOULDER ARTHROSCOPY WITH ROTATOR CUFF REPAIR AND OPEN BICEPS TENODESIS;   Surgeon: BVanetta Mulders MD;  [facility]  Service: Orthopedics;  Laterality: Right;   TONSILLECTOMY     TOTAL HIP ARTHROPLASTY Right 04/26/2018   Procedure: RIGHT TOTAL HIP ARTHROPLASTY ANTERIOR APPROACH;  Surgeon: XLeandrew Koyanagi MD;  [facility]  Service: Orthopedics;  Laterality: Right;   TOTAL HIP ARTHROPLASTY Left 11/15/2018   TOTAL HIP ARTHROPLASTY Left 11/15/2018   Procedure: LEFT TOTAL HIP ARTHROPLASTY ANTERIOR APPROACH;  Surgeon: XLeandrew Koyanagi MD;  [facility]  Service: Orthopedics;  Laterality: Left;   Social History   Socioeconomic History   Marital status: Single    Spouse name: Not on file   Number of children: Not on file   Years of education: Not on file   Highest education level: Not on file  Occupational History   Not on file  Tobacco Use   Smoking status: Former   Smokeless tobacco: Never   Tobacco comments:    Smoked lightly for approx 20 years, quit 1990s  Vaping Use   Vaping Use: Never used  Substance and Sexual Activity   Alcohol use: Not Currently   Drug use: Yes    Types: Marijuana    Comment: every night (to sleep)   Sexual activity: Not on file  Other Topics Concern   Not on file  Social History Narrative   Lives Home alone.  Sister makes all health related decisions. Declined Advanced directive at this time   Social Determinants of Health   Financial Resource Strain: Not on file  Food Insecurity: Not on file  Transportation Needs: Not on file  Physical Activity: Not on file  Stress: Not on file  Social Connections: Not on file   Family History  Problem Relation Age of Onset   CAD Father        MI/CABG age 5   Allergies  Allergen Reactions   Statins Other (See Comments)    Elevated LFTs   Current Outpatient Medications  Medication Sig Dispense Refill   amLODipine (NORVASC) 5 MG tablet Take 1 tablet (5 mg total) by mouth daily. 90 tablet 0   aspirin EC 325 MG tablet TAKE 1 TABLET BY MOUTH EVERY DAY 30 tablet 0    carvedilol (COREG) 3.125 MG tablet Take 1 tablet (3.125 mg total) by mouth 2 (two) times daily. 180 tablet 2   Evolocumab (REPATHA SURECLICK) 951 MG/ML SOAJ Inject 1 mL into the skin every 14 (fourteen) days. 6 mL 3   fluticasone (FLONASE) 50 MCG/ACT nasal spray PLACE 1 SPRAY INTO BOTH NOSTRILS DAILY AS NEEDED FOR ALLERGIES OR RHINITIS. 48 mL 1   ibuprofen (ADVIL) 800 MG tablet TAKE 1 TABLET BY MOUTH EVERY 8 HOURS FOR 10 DAYS. TAKE WITH FOOD, ALTERNATE WITH ACETAMINOPHEN 30 tablet 0   lisinopril (ZESTRIL) 10 MG tablet TAKE 1 TABLET BY MOUTH TWICE A DAY 180 tablet 1   loratadine (CLARITIN) 10 MG tablet Take 10 mg by mouth daily.     methocarbamol (ROBAXIN) 500 MG tablet Take 1 tablet (500 mg total) by mouth 2 (two) times daily as needed for muscle spasms. 20 tablet 2   Multiple Vitamins-Minerals (PRESERVISION AREDS 2) CAPS Take 1 capsule by mouth 2 (two) times daily.     oxyCODONE (ROXICODONE) 5 MG immediate release tablet Take 1 tablet (5 mg total) by mouth every 4 (four) hours as needed for severe pain. 30 tablet 0   potassium chloride (KLOR-CON) 10 MEQ tablet TAKE 1 TABLET (10 MEQ TOTAL) BY MOUTH DAILY. DX: HYPOKALEMIA 90 tablet 1   No current facility-administered medications for this visit.   No results found.  Review of Systems:   A ROS was performed including pertinent positives and negatives as documented in the HPI.   Musculoskeletal Exam:    There were no vitals taken for this visit.  Right shoulder incisions are well-appearing.  No erythema or drainage.  Passive forward elevation of both shoulders is to 145 degrees with external rotation at side to 45 degrees.  Internal rotation is to L1 bilaterally.  On the left there is 4 out of 5 strength with forward elevation in the supraspinatus distribution.  Negative empty can, negative belly press.  Tenderness over the lateral deltoid.    Imaging:      I personally reviewed and interpreted the radiographs.   Assessment:   3 months  status post right rotator cuff repair and biceps tenodesis doing well.  This time he is continuing to improve.  At this time his left shoulder has become more symptomatic.  Unfortunately he is not able to rehab through the left shoulder.  He is very concerned about rotator cuff tear.  I do believe that this is a possibility as he does have a history of contralateral rotator cuff tear.  Given the fact that he has now trialed and failed a minimum of 6 weeks of physical therapy I do  believe that he would be a candidate for an MRI of the left shoulder so that we can further ascertain if an injection and continued rehab would be his best option.  Plan :    -Return to clinic following MRI left shoulder      I personally saw and evaluated the patient, and participated in the management and treatment plan.  Vanetta Mulders, MD Attending Physician, Orthopedic Surgery  This document was dictated using Dragon voice recognition software. A reasonable attempt at proof reading has been made to minimize errors.

## 2022-02-28 ENCOUNTER — Other Ambulatory Visit (HOSPITAL_BASED_OUTPATIENT_CLINIC_OR_DEPARTMENT_OTHER): Payer: Self-pay | Admitting: Orthopaedic Surgery

## 2022-02-28 ENCOUNTER — Other Ambulatory Visit: Payer: Self-pay | Admitting: Internal Medicine

## 2022-02-28 DIAGNOSIS — I2510 Atherosclerotic heart disease of native coronary artery without angina pectoris: Secondary | ICD-10-CM

## 2022-02-28 NOTE — Telephone Encounter (Signed)
Unable to refill per protocol, Rx expired. Medication was discontinued 12/05/21 due to side effects. Will refuse.  Requested Prescriptions  Pending Prescriptions Disp Refills   atorvastatin (LIPITOR) 10 MG tablet [Pharmacy Med Name: ATORVASTATIN 10 MG TABLET] 36 tablet 1    Sig: TAKE 1 TABLET BY MOUTH EVERY MONDAY, [place], [place]     Cardiovascular:  Antilipid - Statins Failed - 02/28/2022  6:11 AM      Failed - Lipid Panel in normal range within the last 12 months    Cholesterol, Total  Date Value Ref Range Status  09/10/2021 114 100 - 199 mg/dL Final   LDL Chol Calc (NIH)  Date Value Ref Range Status  09/10/2021 52 0 - 99 mg/dL Final   HDL  Date Value Ref Range Status  09/10/2021 34 (L) >39 mg/dL Final   Triglycerides  Date Value Ref Range Status  09/10/2021 167 (H) 0 - 149 mg/dL Final         Passed - Patient is not pregnant      Passed - Valid encounter within last 12 months    Recent Outpatient Visits           1 month ago Essential hypertension   [place], Deborah B, MD   5 months ago Essential hypertension   [place], Deborah B, MD   10 months ago Essential hypertension   [place], Deborah B, MD   1 year ago Essential hypertension   [place], Deborah B, MD   1 year ago Essential hypertension   [place], Deborah B, MD       Future Appointments             In 3 weeks Vanetta Mulders, MD [facility]

## 2022-03-04 ENCOUNTER — Encounter (HOSPITAL_BASED_OUTPATIENT_CLINIC_OR_DEPARTMENT_OTHER): Payer: Self-pay | Admitting: Physical Therapy

## 2022-03-04 ENCOUNTER — Ambulatory Visit (HOSPITAL_BASED_OUTPATIENT_CLINIC_OR_DEPARTMENT_OTHER): Payer: Medicaid Other | Admitting: Physical Therapy

## 2022-03-04 DIAGNOSIS — M25511 Pain in right shoulder: Secondary | ICD-10-CM

## 2022-03-04 DIAGNOSIS — M6281 Muscle weakness (generalized): Secondary | ICD-10-CM

## 2022-03-04 DIAGNOSIS — M25611 Stiffness of right shoulder, not elsewhere classified: Secondary | ICD-10-CM

## 2022-03-04 NOTE — Therapy (Signed)
OUTPATIENT PHYSICAL THERAPY TREATMENT NOTE   Patient Name: Joshua [name] MRN: [id number] DOB:April 20, 1960, 61 y.o., male Today's Date: 03/04/2022   PT End of Session - 03/04/22 1454     Visit Number 13    Number of Visits 26    Date for PT Re-Evaluation 06/02/22    Authorization Type [employer] MCD    PT Start Time 1440   arrives late   Activity Tolerance Patient tolerated treatment well    Behavior During Therapy [facility] for tasks assessed/performed                      Past Medical History:  Diagnosis Date   Aortic stenosis    CAD (coronary artery disease), native coronary artery 12/26/2016   DES LAD & RCA, EF 25-30%   CHF (congestive heart failure) (HCC)    Dyslipidemia    Hypertension    Obesity    Osteoarthritis    Pre-diabetes    a. prior A1C 5.6, states he was on meds for this at one point.   Past Surgical History:  Procedure Laterality Date   CORONARY STENT INTERVENTION N/A 12/29/2016   Procedure: CORONARY STENT INTERVENTION;  Surgeon: Jettie Booze, MD;  Location: [place] [facility];  Service: Cardiovascular;  Laterality: N/A;   RIGHT/LEFT HEART CATH AND CORONARY ANGIOGRAPHY N/A 12/29/2016   Procedure: RIGHT/LEFT HEART CATH AND CORONARY ANGIOGRAPHY;  Surgeon: Jettie Booze, MD;  Location: [place] [facility];  Service: Cardiovascular;  Laterality: N/A;   SHOULDER ARTHROSCOPY WITH ROTATOR CUFF REPAIR AND OPEN BICEPS TENODESIS Right 11/28/2021   Procedure: RIGHT SHOULDER ARTHROSCOPY WITH ROTATOR CUFF REPAIR AND OPEN BICEPS TENODESIS;  Surgeon: Vanetta Mulders, MD;  Location: [place];  Service: Orthopedics;  Laterality: Right;   TONSILLECTOMY     TOTAL HIP ARTHROPLASTY Right 04/26/2018   Procedure: RIGHT TOTAL HIP ARTHROPLASTY ANTERIOR APPROACH;  Surgeon: Leandrew Koyanagi, MD;  Location: [place];  Service: Orthopedics;  Laterality: Right;   TOTAL HIP ARTHROPLASTY Left 11/15/2018   TOTAL HIP ARTHROPLASTY Left 11/15/2018   Procedure: LEFT TOTAL HIP ARTHROPLASTY  ANTERIOR APPROACH;  Surgeon: Leandrew Koyanagi, MD;  Location: [place];  Service: Orthopedics;  Laterality: Left;   Patient Active Problem List   Diagnosis Date Noted   Biceps tendinitis of right upper extremity    Rotator cuff syndrome of right shoulder 10/16/2021   Other insomnia 04/30/2021   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   COVID-19 vaccine regimen to maintain immunity completed 04/27/2020   Statin intolerance 12/10/2018   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/15/2018   Chronic left shoulder pain 06/08/2018   Status post total hip replacement, right 04/26/2018   Mild dilation of ascending aorta ([place]) 12/26/2017   Dyslipidemia 12/26/2017   Bilateral carotid bruits 12/26/2017   Nocturnal hypoxemia 06/29/2017   Primary osteoarthritis of both hips 06/29/2017   Chronic diastolic heart failure ([place])    Coronary artery disease involving native coronary artery of native heart without angina pectoris 03/31/2017   Ischemic cardiomyopathy 03/31/2017   Marijuana use 03/31/2017   Prediabetes 03/31/2017   Aortic valve insufficiency 12/28/2016   Mitral valve regurgitation 12/28/2016   Obesity 12/28/2016   Aortic valve stenosis, nonrheumatic    Essential hypertension     REFERRING PROVIDER: Vanetta Mulders, MD  REFERRING DIAG: M75.101 (ICD-10-CM) - Rotator cuff syndrome of right shoulder   THERAPY DIAG:  Stiffness of right shoulder, not elsewhere classified - Plan: PT plan of care cert/re-cert  Muscle weakness (generalized) - Plan: PT plan of care cert/re-cert  Right shoulder pain, unspecified chronicity - Plan: PT plan of care cert/re-cert  Rationale for Evaluation and Treatment Rehabilitation  ONSET DATE: DOS 11/29/2019  Days since surgery: 96   SUBJECTIVE:                                                                                                                                                                                      SUBJECTIVE STATEMENT: Pt is  10 weeks s/p Arthroscopic supraspinatus and subscapularis repairs, biceps tenodesis, debridement of labrum, and subacromial decompression.   Pt states he has been curling 10lbs. He states he might have strained his pec picking up a wheelbarrow.   PERTINENT HISTORY: -supraspinatus and subscapularis repairs, biceps tenodesis, debridement of labrum, and subacromial decompression on 11/28/21 -L shoulder pain, CHF with coronary Stent placement, R and L THA in 2020, OA   PAIN:  Are you having pain? No NPRS:  Current:  0/10, Best:  3/10, Worst:  9/10 Location:  R shoulder NPRS:  5/10 Location: L shoulder   PRECAUTIONS: Other: per surgical protocol, PROM  WEIGHT BEARING RESTRICTIONS Yes R UE  FALLS:  Has patient fallen in last 6 months? 1 fall, this injury  LIVING ENVIRONMENT: Lives with: lives alone   OCCUPATION: He is retired and on disability.   PLOF: Independent; Pt was able to perform his ADLs and IADLs and reaching activities independently.  PATIENT GOALS Reach top shelf, reach behind head.  OBJECTIVE:   DIAGNOSTIC FINDINGS:  Pt had x rays and MRI prior to surgery.  UPPER EXTREMITY ROM:    Active ROM Right eval Left eval R  10/12 R 11/21  Shoulder flexion   154 145 150  Shoulder extension        Shoulder abduction   146 95/ AAROM 130 120  Shoulder adduction        Shoulder internal rotation     BHB reach to L2 BHB reach to L2  Shoulder external rotation     BHB reach to C3 BHB reach to C3  (Blank rows = not tested)   R shoulder PROM not tested due to time constraints.    UPPER EXTREMITY MMT:   MMT Right 11/21  Shoulder flexion 4/5  Shoulder extension 4/5  Shoulder abduction 4/5  Shoulder adduction   Shoulder internal rotation 4/5  Shoulder external rotation 4/5  (Blank rows = not tested)    TODAY'S TREATMENT:  UBE L1 2 min fwd and retro  Exercises Supine flexion 3x8 1lb S/L ER 3lb 2x10 Sidelying ABD 3x8 Shoulder G TB extension 3x10 GTB row  3x10 Scaption 1lb 2x10  Standing pec stretch 30s 3x  PATIENT EDUCATION: Education details: anatomy, exercise progression, DOMS expectations, muscle firing,  envelope of function, HEP, POC  Person educated: Patient Education method: Customer service manager Education comprehension: verbalized understanding, returned demonstration, and needs further education   HOME EXERCISE PROGRAM: Access Code: [email or web address] URL: [email or web address] Date: 12/11/2021 Prepared by: Ronny Flurry  ASSESSMENT:  CLINICAL IMPRESSION: Pt demo's good progress with AROM at today's session. Pt is still limited with full OH strength. Pt returns today after extended period away from therapy so HEP progressed at today's visit with progressive strengthening incorporated for home. Pt without pain with new exercises. Pt most weakness into ABD and scaption. Plan to continue with progressive strengthening as tolerated.  Pt should benefit from skilled PT services per protocol to address impairments and to assist in restoring desired level of function.      OBJECTIVE IMPAIRMENTS decreased activity tolerance, decreased endurance, decreased ROM, decreased strength, hypomobility, impaired flexibility, impaired UE functional use, and pain.   ACTIVITY LIMITATIONS carrying, lifting, sleeping, bathing, dressing, reach over head, and hygiene/grooming  PARTICIPATION LIMITATIONS: meal prep, cleaning, laundry, driving, shopping, and community activity  PERSONAL FACTORS 1-2 comorbidities: L shoulder pain and OA  are also affecting patient's functional outcome.   REHAB POTENTIAL: Good  CLINICAL DECISION MAKING: Stable/uncomplicated  EVALUATION COMPLEXITY: Low   GOALS:   SHORT TERM GOALS: Target date:06/02/2022   Pt will be independent and compliant with HEP for improved pain, ROM, and strength.  Baseline: Goal status: MET    2.  Pt will tolerate PROM per protocol without adverse effects for improved  ROM.  Baseline:  Goal status: MET   3.  Pt will demo PROM to 110-120 deg in flexion and 0 deg in ER for improved stiffness and mobility.  Baseline:  Goal status: MET  4.  Pt will tolerate AAROM without adverse effects for progression of protocol.  Baseline:  Goal status: ongoing  5. Pt will demo supine shoulder AAROM to be 120 deg in flexion and in 40 deg in ER for progression of AAROM and protocol Baseline:  Goal status: ongoing   6.   Pt will wean out of sling per MD allowance without adverse effects Baseline:  Goal status: ongoing   7.  Pt will demo R shoulder flexion and scaption AROM to at least 100 deg in standing without significant shoulder hike for improved UE elevation. Baseline:  Goal status: ongoing   8.  Pt will be able to perform his self care activities with no > than minimal difficulty.   Baseline:  Goal status: ongoing        LONG TERM GOALS:   Pt will demo R shoulder AROM to be [facility] t/o for performance of ADLs and IADLs.  Baseline:  Goal status: ongoing Target date:  03/25/2022   2.  Pt will be able to perform his ADLs and IADLs without significant difficulty and pain. Baseline:  Goal status: ongoing Target date:  03/25/2022  3.  Pt will be able to perform his normal reaching and overhead activities without significant pain and limitation. Baseline:  Goal status: ongoing Target date:  03/25/2022  4.  Pt will demo at least 4/5 MMT strength t/o R shoulder for improved tolerance with and performance of daily activities, household chores, and functional lifting.  Baseline:  Goal status: ongoing Target Date:  03/25/2022  5.  Pt will be independent with advanced HEP for improved shoulder strength and stability to assist with returning to desired level of  function and performing functional carrying and lifting. Baseline:  Goal status: ongoing    PLAN: PT FREQUENCY: 1x/every other   PT DURATION: other: 12 weeks  PLANNED INTERVENTIONS:  Therapeutic exercises, Therapeutic activity, Neuromuscular re-education, Patient/Family education, Self Care, Joint mobilization, Aquatic Therapy, Dry Needling, Electrical stimulation, Spinal mobilization, Cryotherapy, Moist heat, scar mobilization, Taping, Ultrasound, Manual therapy, and Re-evaluation  PLAN FOR NEXT SESSION:  Cont with shoulder PROM per Dr. Eddie Dibbles protocol.  Pt had a subscapularis repair also, will use subscapularis repair protocol   Daleen Bo PT, DPT 03/04/22 3:12 PM

## 2022-03-12 ENCOUNTER — Encounter (HOSPITAL_BASED_OUTPATIENT_CLINIC_OR_DEPARTMENT_OTHER): Payer: Self-pay | Admitting: Physical Therapy

## 2022-03-12 ENCOUNTER — Ambulatory Visit (HOSPITAL_BASED_OUTPATIENT_CLINIC_OR_DEPARTMENT_OTHER): Payer: Medicaid Other | Admitting: Physical Therapy

## 2022-03-12 DIAGNOSIS — M25611 Stiffness of right shoulder, not elsewhere classified: Secondary | ICD-10-CM

## 2022-03-12 DIAGNOSIS — M6281 Muscle weakness (generalized): Secondary | ICD-10-CM

## 2022-03-12 DIAGNOSIS — M25511 Pain in right shoulder: Secondary | ICD-10-CM

## 2022-03-12 NOTE — Therapy (Signed)
OUTPATIENT PHYSICAL THERAPY TREATMENT NOTE   Patient Name: Joshua [name] MRN: [id number] DOB:July 19, 1960, 61 y.o., male Today's Date: 03/13/2022   PT End of Session - 03/12/22 1455     Visit Number 14    Number of Visits 26    Date for PT Re-Evaluation 06/02/22    Authorization Type [employer] MCD    PT Start Time 1448    PT Stop Time 1522    PT Time Calculation (min) 34 min    Activity Tolerance Patient tolerated treatment well    Behavior During Therapy [facility] for tasks assessed/performed                      Past Medical History:  Diagnosis Date   Aortic stenosis    CAD (coronary artery disease), native coronary artery 12/26/2016   DES LAD & RCA, EF 25-30%   CHF (congestive heart failure) (HCC)    Dyslipidemia    Hypertension    Obesity    Osteoarthritis    Pre-diabetes    a. prior A1C 5.6, states he was on meds for this at one point.   Past Surgical History:  Procedure Laterality Date   CORONARY STENT INTERVENTION N/A 12/29/2016   Procedure: CORONARY STENT INTERVENTION;  Surgeon: Jettie Booze, MD;  Location: [place] [facility];  Service: Cardiovascular;  Laterality: N/A;   RIGHT/LEFT HEART CATH AND CORONARY ANGIOGRAPHY N/A 12/29/2016   Procedure: RIGHT/LEFT HEART CATH AND CORONARY ANGIOGRAPHY;  Surgeon: Jettie Booze, MD;  Location: [place] [facility];  Service: Cardiovascular;  Laterality: N/A;   SHOULDER ARTHROSCOPY WITH ROTATOR CUFF REPAIR AND OPEN BICEPS TENODESIS Right 11/28/2021   Procedure: RIGHT SHOULDER ARTHROSCOPY WITH ROTATOR CUFF REPAIR AND OPEN BICEPS TENODESIS;  Surgeon: Vanetta Mulders, MD;  Location: [place];  Service: Orthopedics;  Laterality: Right;   TONSILLECTOMY     TOTAL HIP ARTHROPLASTY Right 04/26/2018   Procedure: RIGHT TOTAL HIP ARTHROPLASTY ANTERIOR APPROACH;  Surgeon: Leandrew Koyanagi, MD;  Location: [place];  Service: Orthopedics;  Laterality: Right;   TOTAL HIP ARTHROPLASTY Left 11/15/2018   TOTAL HIP ARTHROPLASTY Left  11/15/2018   Procedure: LEFT TOTAL HIP ARTHROPLASTY ANTERIOR APPROACH;  Surgeon: Leandrew Koyanagi, MD;  Location: [place];  Service: Orthopedics;  Laterality: Left;   Patient Active Problem List   Diagnosis Date Noted   Biceps tendinitis of right upper extremity    Rotator cuff syndrome of right shoulder 10/16/2021   Other insomnia 04/30/2021   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   COVID-19 vaccine regimen to maintain immunity completed 04/27/2020   Statin intolerance 12/10/2018   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/15/2018   Chronic left shoulder pain 06/08/2018   Status post total hip replacement, right 04/26/2018   Mild dilation of ascending aorta ([place]) 12/26/2017   Dyslipidemia 12/26/2017   Bilateral carotid bruits 12/26/2017   Nocturnal hypoxemia 06/29/2017   Primary osteoarthritis of both hips 06/29/2017   Chronic diastolic heart failure ([place])    Coronary artery disease involving native coronary artery of native heart without angina pectoris 03/31/2017   Ischemic cardiomyopathy 03/31/2017   Marijuana use 03/31/2017   Prediabetes 03/31/2017   Aortic valve insufficiency 12/28/2016   Mitral valve regurgitation 12/28/2016   Obesity 12/28/2016   Aortic valve stenosis, nonrheumatic    Essential hypertension     REFERRING PROVIDER: Vanetta Mulders, MD  REFERRING DIAG: M75.101 (ICD-10-CM) - Rotator cuff syndrome of right shoulder   THERAPY DIAG:  Stiffness of  right shoulder, not elsewhere classified  Muscle weakness (generalized)  Right shoulder pain, unspecified chronicity  Rationale for Evaluation and Treatment Rehabilitation  ONSET DATE: DOS 11/29/2019  Days since surgery: 104   SUBJECTIVE:                                                                                                                                                                                      SUBJECTIVE STATEMENT: Pt is 14 weeks and 6 days s/p Arthroscopic supraspinatus and  subscapularis repairs, biceps tenodesis, debridement of labrum, and subacromial decompression.   Pt states he has been curling 10lbs and informed MD.  Pt reports he feels stronger.  Pt states he  has been doing some exercises with a heavy band tied to his bed post.  He stands with his back to the bed post with band behind him.  He is doing motions like a chest press and fly.  PT instructed pt to not perform those exercises at home.  Pt states his L shoulder has been bothering him.  Pt states he may need a cardiac valve replacement at some time.  He is followed by cardiology.    PERTINENT HISTORY: -supraspinatus and subscapularis repairs, biceps tenodesis, debridement of labrum, and subacromial decompression on 11/28/21 -L shoulder pain, CHF with coronary Stent placement, R and L THA in 2020, OA   PAIN:  Are you having pain? No NPRS:  Current:  0/10, Best:  3/10, Worst:  9/10.  Pt did have 2-3/10 pain while in the car due to positioning.  He had to reposition his UE and felt better.  Location:  R shoulder   PRECAUTIONS: Other: per surgical protocol, PROM  WEIGHT BEARING RESTRICTIONS Yes R UE  FALLS:  Has patient fallen in last 6 months? 1 fall, this injury  LIVING ENVIRONMENT: Lives with: lives alone   OCCUPATION: He is retired and on disability.   PLOF: Independent; Pt was able to perform his ADLs and IADLs and reaching activities independently.  PATIENT GOALS Reach top shelf, reach behind head.  OBJECTIVE:   DIAGNOSTIC FINDINGS:  Pt had x rays and MRI prior to surgery.   TODAY'S TREATMENT:  Pt performed: UBE L1 2 min fwd and 1 min retro Standing Jobe's flexion in front of mirror with visual and verbal cues to decrease shoulder hike 2x10 Standing scaption in front of mirror approx 6 reps Standing rows with GTB 2x10 Standing shoulder extension with GTB 2x10 Supine flexion with 1# x8 and x10 reps Supine rhythmic stab's at 90 deg 3x30 sec S/L shoulder abd 2x8 ER with arm  at side with YTB 2x10   PATIENT EDUCATION: Education details: anatomy,  exercise progression, DOMS expectations, muscle firing,  envelope of function, HEP, POC  Person educated: Patient Education method: Explanation and Demonstration Education comprehension: verbalized understanding, returned demonstration, and needs further education   HOME EXERCISE PROGRAM: Access Code: [email or web address] URL: [email or web address] Date: 12/11/2021 Prepared by: Ronny Flurry  ASSESSMENT:  CLINICAL IMPRESSION: Pt is limited with UE elevation in standing and has compensatory shoulder hike.  PT worked on decreasing shoulder hike with elevation.  Pt reports having more pain on L shoulder than R with standing flexion.  Pt requires cuing and instruction for correct form with exercises.  Pt responded well to rx having no c/o's after Rx.  Pt should benefit from cont skilled PT services per protocol to address impairments and to assist in restoring desired level of function.      OBJECTIVE IMPAIRMENTS decreased activity tolerance, decreased endurance, decreased ROM, decreased strength, hypomobility, impaired flexibility, impaired UE functional use, and pain.   ACTIVITY LIMITATIONS carrying, lifting, sleeping, bathing, dressing, reach over head, and hygiene/grooming  PARTICIPATION LIMITATIONS: meal prep, cleaning, laundry, driving, shopping, and community activity  PERSONAL FACTORS 1-2 comorbidities: L shoulder pain and OA  are also affecting patient's functional outcome.   REHAB POTENTIAL: Good  CLINICAL DECISION MAKING: Stable/uncomplicated  EVALUATION COMPLEXITY: Low   GOALS:   SHORT TERM GOALS: Target date:06/02/2022   Pt will be independent and compliant with HEP for improved pain, ROM, and strength.  Baseline: Goal status: MET    2.  Pt will tolerate PROM per protocol without adverse effects for improved ROM.  Baseline:  Goal status: MET   3.  Pt will demo PROM to 110-120 deg in  flexion and 0 deg in ER for improved stiffness and mobility.  Baseline:  Goal status: MET  4.  Pt will tolerate AAROM without adverse effects for progression of protocol.  Baseline:  Goal status: ongoing  5. Pt will demo supine shoulder AAROM to be 120 deg in flexion and in 40 deg in ER for progression of AAROM and protocol Baseline:  Goal status: ongoing   6.   Pt will wean out of sling per MD allowance without adverse effects Baseline:  Goal status: ongoing   7.  Pt will demo R shoulder flexion and scaption AROM to at least 100 deg in standing without significant shoulder hike for improved UE elevation. Baseline:  Goal status: ongoing   8.  Pt will be able to perform his self care activities with no > than minimal difficulty.   Baseline:  Goal status: ongoing        LONG TERM GOALS:   Pt will demo R shoulder AROM to be [facility] t/o for performance of ADLs and IADLs.  Baseline:  Goal status: ongoing Target date:  03/25/2022   2.  Pt will be able to perform his ADLs and IADLs without significant difficulty and pain. Baseline:  Goal status: ongoing Target date:  03/25/2022  3.  Pt will be able to perform his normal reaching and overhead activities without significant pain and limitation. Baseline:  Goal status: ongoing Target date:  03/25/2022  4.  Pt will demo at least 4/5 MMT strength t/o R shoulder for improved tolerance with and performance of daily activities, household chores, and functional lifting.  Baseline:  Goal status: ongoing Target Date:  03/25/2022  5.  Pt will be independent with advanced HEP for improved shoulder strength and stability to assist with returning to desired level of function and performing functional carrying and lifting. Baseline:  Goal status:  ongoing    PLAN: PT FREQUENCY: 1x/every other   PT DURATION: other: 12 weeks  PLANNED INTERVENTIONS: Therapeutic exercises, Therapeutic activity, Neuromuscular re-education,  Patient/Family education, Self Care, Joint mobilization, Aquatic Therapy, Dry Needling, Electrical stimulation, Spinal mobilization, Cryotherapy, Moist heat, scar mobilization, Taping, Ultrasound, Manual therapy, and Re-evaluation  PLAN FOR NEXT SESSION:  Cont with shoulder PROM per Dr. Eddie Dibbles protocol.  Pt had a subscapularis repair also, will use subscapularis repair protocol   Selinda Michaels III PT, DPT 03/13/22 10:18 PM

## 2022-03-20 ENCOUNTER — Encounter (HOSPITAL_BASED_OUTPATIENT_CLINIC_OR_DEPARTMENT_OTHER): Payer: Medicaid Other | Admitting: Physical Therapy

## 2022-03-27 ENCOUNTER — Ambulatory Visit (HOSPITAL_BASED_OUTPATIENT_CLINIC_OR_DEPARTMENT_OTHER): Payer: Medicaid Other | Admitting: Physical Therapy

## 2022-03-27 ENCOUNTER — Ambulatory Visit (HOSPITAL_BASED_OUTPATIENT_CLINIC_OR_DEPARTMENT_OTHER): Payer: Medicaid Other | Admitting: Orthopaedic Surgery

## 2022-03-27 ENCOUNTER — Encounter (HOSPITAL_BASED_OUTPATIENT_CLINIC_OR_DEPARTMENT_OTHER): Payer: Self-pay

## 2022-04-03 ENCOUNTER — Ambulatory Visit (HOSPITAL_BASED_OUTPATIENT_CLINIC_OR_DEPARTMENT_OTHER): Payer: Medicaid Other | Admitting: Orthopaedic Surgery

## 2022-04-04 ENCOUNTER — Other Ambulatory Visit: Payer: Self-pay | Admitting: Internal Medicine

## 2022-04-04 ENCOUNTER — Other Ambulatory Visit: Payer: Self-pay | Admitting: Physician Assistant

## 2022-04-04 DIAGNOSIS — I2510 Atherosclerotic heart disease of native coronary artery without angina pectoris: Secondary | ICD-10-CM

## 2022-04-04 NOTE — Telephone Encounter (Signed)
Rx was dc'd 12/05/21.   Requested Prescriptions  Refused Prescriptions Disp Refills   atorvastatin (LIPITOR) 10 MG tablet [Pharmacy Med Name: ATORVASTATIN 10 MG TABLET] 36 tablet 1    Sig: TAKE 1 TABLET BY MOUTH EVERY MONDAY, [place], [place]     Cardiovascular:  Antilipid - Statins Failed - 04/04/2022  4:48 PM      Failed - Lipid Panel in normal range within the last 12 months    Cholesterol, Total  Date Value Ref Range Status  09/10/2021 114 100 - 199 mg/dL Final   LDL Chol Calc (NIH)  Date Value Ref Range Status  09/10/2021 52 0 - 99 mg/dL Final   HDL  Date Value Ref Range Status  09/10/2021 34 (L) >39 mg/dL Final   Triglycerides  Date Value Ref Range Status  09/10/2021 167 (H) 0 - 149 mg/dL Final         Passed - Patient is not pregnant      Passed - Valid encounter within last 12 months    Recent Outpatient Visits           2 months ago Essential hypertension   [place], Deborah B, MD   6 months ago Essential hypertension   [place], Deborah B, MD   11 months ago Essential hypertension   [place], Deborah B, MD   1 year ago Essential hypertension   [place], Deborah B, MD   1 year ago Essential hypertension   [place], Deborah B, MD       Future Appointments             In 2 weeks Vanetta Mulders, MD [facility]

## 2022-04-15 ENCOUNTER — Other Ambulatory Visit (HOSPITAL_BASED_OUTPATIENT_CLINIC_OR_DEPARTMENT_OTHER): Payer: Self-pay | Admitting: Orthopaedic Surgery

## 2022-04-15 ENCOUNTER — Other Ambulatory Visit: Payer: Self-pay | Admitting: Internal Medicine

## 2022-04-15 DIAGNOSIS — I10 Essential (primary) hypertension: Secondary | ICD-10-CM

## 2022-04-16 ENCOUNTER — Ambulatory Visit
Admission: RE | Admit: 2022-04-16 | Discharge: 2022-04-16 | Disposition: A | Discharge status: Home / Self Care | Payer: Medicaid Other | Source: Ambulatory Visit | Attending: Orthopaedic Surgery | Admitting: Orthopaedic Surgery

## 2022-04-16 DIAGNOSIS — G8929 Other chronic pain: Secondary | ICD-10-CM

## 2022-04-16 DIAGNOSIS — M19012 Primary osteoarthritis, left shoulder: Secondary | ICD-10-CM | POA: Diagnosis not present

## 2022-04-16 DIAGNOSIS — S46012A Strain of muscle(s) and tendon(s) of the rotator cuff of left shoulder, initial encounter: Secondary | ICD-10-CM | POA: Diagnosis not present

## 2022-04-23 ENCOUNTER — Encounter (HOSPITAL_BASED_OUTPATIENT_CLINIC_OR_DEPARTMENT_OTHER): Payer: Self-pay | Admitting: Physical Therapy

## 2022-04-23 ENCOUNTER — Ambulatory Visit (INDEPENDENT_AMBULATORY_CARE_PROVIDER_SITE_OTHER): Payer: Medicaid Other | Admitting: Orthopaedic Surgery

## 2022-04-23 ENCOUNTER — Ambulatory Visit (HOSPITAL_BASED_OUTPATIENT_CLINIC_OR_DEPARTMENT_OTHER): Payer: Medicaid Other | Attending: Orthopaedic Surgery | Admitting: Physical Therapy

## 2022-04-23 DIAGNOSIS — M25511 Pain in right shoulder: Secondary | ICD-10-CM | POA: Insufficient documentation

## 2022-04-23 DIAGNOSIS — M75101 Unspecified rotator cuff tear or rupture of right shoulder, not specified as traumatic: Secondary | ICD-10-CM | POA: Diagnosis not present

## 2022-04-23 DIAGNOSIS — M6281 Muscle weakness (generalized): Secondary | ICD-10-CM | POA: Insufficient documentation

## 2022-04-23 DIAGNOSIS — M25611 Stiffness of right shoulder, not elsewhere classified: Secondary | ICD-10-CM

## 2022-04-23 NOTE — Progress Notes (Signed)
Post Operative Evaluation    Procedure/Date of Surgery: Right shoulder rotator cuff repair with biceps tenodesis 11/28/21  Interval History:    04/23/2022: Presents today for follow-up of the above procedure.  Overall he is continue to work on his right shoulder range of motion and strengthening.  He states that the shoulder is feeling much better.  He is now to lay on the side.  With regard to his left shoulder this has been persistently painful and is now not able to lay on his left side.  He is here today for further discussion and MRI review of the left shoulder.   PMH/PSH/Family History/Social History/Meds/Allergies:    Past Medical History:  Diagnosis Date   Aortic stenosis    CAD (coronary artery disease), native coronary artery 12/26/2016   DES LAD & RCA, EF 25-30%   CHF (congestive heart failure) (HCC)    Dyslipidemia    Hypertension    Obesity    Osteoarthritis    Pre-diabetes    a. prior A1C 5.6, states he was on meds for this at one point.   Past Surgical History:  Procedure Laterality Date   CORONARY STENT INTERVENTION N/A 12/29/2016   Procedure: CORONARY STENT INTERVENTION;  Surgeon: Jettie Booze, MD;  Location: [facility] [facility];  Service: Cardiovascular;  Laterality: N/A;   RIGHT/LEFT HEART CATH AND CORONARY ANGIOGRAPHY N/A 12/29/2016   Procedure: RIGHT/LEFT HEART CATH AND CORONARY ANGIOGRAPHY;  Surgeon: Jettie Booze, MD;  Location: [facility];  Service: Cardiovascular;  Laterality: N/A;   SHOULDER ARTHROSCOPY WITH ROTATOR CUFF REPAIR AND OPEN BICEPS TENODESIS Right 11/28/2021   Procedure: RIGHT SHOULDER ARTHROSCOPY WITH ROTATOR CUFF REPAIR AND OPEN BICEPS TENODESIS;  Surgeon: Vanetta Mulders, MD;  Location: [facility];  Service: Orthopedics;  Laterality: Right;   TONSILLECTOMY     TOTAL HIP ARTHROPLASTY Right 04/26/2018   Procedure: RIGHT TOTAL HIP ARTHROPLASTY ANTERIOR APPROACH;  Surgeon: Leandrew Koyanagi, MD;   Location: [facility];  Service: Orthopedics;  Laterality: Right;   TOTAL HIP ARTHROPLASTY Left 11/15/2018   TOTAL HIP ARTHROPLASTY Left 11/15/2018   Procedure: LEFT TOTAL HIP ARTHROPLASTY ANTERIOR APPROACH;  Surgeon: Leandrew Koyanagi, MD;  Location: [place];  Service: Orthopedics;  Laterality: Left;   Social History   Socioeconomic History   Marital status: Single    Spouse name: Not on file   Number of children: Not on file   Years of education: Not on file   Highest education level: Not on file  Occupational History   Not on file  Tobacco Use   Smoking status: Former   Smokeless tobacco: Never   Tobacco comments:    Smoked lightly for approx 20 years, quit 1990s  Vaping Use   Vaping Use: Never used  Substance and Sexual Activity   Alcohol use: Not Currently   Drug use: Yes    Types: Marijuana    Comment: every night (to sleep)   Sexual activity: Not on file  Other Topics Concern   Not on file  Social History Narrative   Lives Home alone. Sister makes all health related decisions. Declined Advanced directive at this time   Social Determinants of Health   Financial Resource Strain: Not on file  Food Insecurity: Not on file  Transportation Needs: Not on file  Physical Activity:  Not on file  Stress: Not on file  Social Connections: Not on file   Family History  Problem Relation Age of Onset   CAD Father        MI/CABG age 55   Allergies  Allergen Reactions   Statins Other (See Comments)    Elevated LFTs   Current Outpatient Medications  Medication Sig Dispense Refill   amLODipine (NORVASC) 5 MG tablet TAKE 1 TABLET (5 MG TOTAL) BY MOUTH DAILY. 30 tablet 0   aspirin EC 325 MG tablet TAKE 1 TABLET BY MOUTH EVERY DAY 30 tablet 0   carvedilol (COREG) 3.125 MG tablet Take 1 tablet (3.125 mg total) by mouth 2 (two) times daily. 180 tablet 2   Evolocumab (REPATHA SURECLICK) 115 MG/ML SOAJ Inject 1 mL into the skin every 14 (fourteen) days. 6 mL 3   fluticasone (FLONASE) 50  MCG/ACT nasal spray PLACE 1 SPRAY INTO BOTH NOSTRILS DAILY AS NEEDED FOR ALLERGIES OR RHINITIS. 48 mL 1   ibuprofen (ADVIL) 800 MG tablet TAKE 1 TABLET BY MOUTH EVERY 8 HOURS FOR 10 DAYS. TAKE WITH FOOD, ALTERNATE WITH ACETAMINOPHEN 30 tablet 0   lisinopril (ZESTRIL) 10 MG tablet TAKE 1 TABLET BY MOUTH TWICE A DAY 180 tablet 1   loratadine (CLARITIN) 10 MG tablet Take 10 mg by mouth daily.     methocarbamol (ROBAXIN) 500 MG tablet Take 1 tablet (500 mg total) by mouth 2 (two) times daily as needed for muscle spasms. 20 tablet 2   Multiple Vitamins-Minerals (PRESERVISION AREDS 2) CAPS Take 1 capsule by mouth 2 (two) times daily.     oxyCODONE (ROXICODONE) 5 MG immediate release tablet Take 1 tablet (5 mg total) by mouth every 4 (four) hours as needed for severe pain. 30 tablet 0   potassium chloride (KLOR-CON) 10 MEQ tablet TAKE 1 TABLET (10 MEQ TOTAL) BY MOUTH DAILY. DX: HYPOKALEMIA 30 tablet 0   No current facility-administered medications for this visit.   No results found.  Review of Systems:   A ROS was performed including pertinent positives and negatives as documented in the HPI.   Musculoskeletal Exam:    There were no vitals taken for this visit.  Right shoulder incisions are well-appearing.  No erythema or drainage.  Active forward elevation on the right shoulder is to 165 degrees, external rotation at side actively is to 60 degrees.  Internal rotation is to L1 bilaterally.  On the left there is 4 out of 5 strength with forward elevation in the supraspinatus distribution.  The left side has approximately 170 degrees of forward elevation with 60 degrees of external rotation at the side, positive Neer impingement, negative belly press.  Tenderness over the lateral deltoid.    Imaging:    MRI left shoulder: There is a full-thickness tear of the supraspinatus tendon without retraction.  There is significant tearing within the biceps tendon  I personally reviewed and interpreted the  radiographs.   Assessment:   4 months status post right rotator cuff repair and biceps tenodesis doing well.  This time he is continuing to improve.  I would like him to continue to work on passive range of motion and strengthening of this shoulder still.  I would like him to also work on the left shoulder as well to continue to strengthen this.  I did describe that I could perform an injection as needed on the left shoulder to help rehab the side as well.  He is working on physical therapy with this.  We did discuss ongoing treatment and that if he is to continue to fail physical therapy on the left shoulder he would potentially consider rotator cuff repair on this side although I would like him to be further along in his rehab with his right shoulder prior to this.  Plan :    -Plan for return to clinic 2 months, all limitations and restrictions were discussed      I personally saw and evaluated the patient, and participated in the management and treatment plan.  Vanetta Mulders, MD Attending Physician, Orthopedic Surgery  This document was dictated using Dragon voice recognition software. A reasonable attempt at proof reading has been made to minimize errors.

## 2022-04-23 NOTE — Therapy (Signed)
OUTPATIENT PHYSICAL THERAPY TREATMENT NOTE   Patient Name: Joshua [name] MRN: [id number] DOB:May 08, 1960, 62 y.o., male Today's Date: 04/23/2022   PT End of Session - 04/23/22 1125     Visit Number 15    Number of Visits 26    Date for PT Re-Evaluation 06/02/22    Authorization Type [employer] MCD    PT Start Time 1116   arrives late   PT Stop Time 1140    PT Time Calculation (min) 24 min    Activity Tolerance Patient tolerated treatment well    Behavior During Therapy [facility] for tasks assessed/performed                       Past Medical History:  Diagnosis Date   Aortic stenosis    CAD (coronary artery disease), native coronary artery 12/26/2016   DES LAD & RCA, EF 25-30%   CHF (congestive heart failure) (HCC)    Dyslipidemia    Hypertension    Obesity    Osteoarthritis    Pre-diabetes    a. prior A1C 5.6, states he was on meds for this at one point.   Past Surgical History:  Procedure Laterality Date   CORONARY STENT INTERVENTION N/A 12/29/2016   Procedure: CORONARY STENT INTERVENTION;  Surgeon: Jettie Booze, MD;  Location: [place] [facility];  Service: Cardiovascular;  Laterality: N/A;   RIGHT/LEFT HEART CATH AND CORONARY ANGIOGRAPHY N/A 12/29/2016   Procedure: RIGHT/LEFT HEART CATH AND CORONARY ANGIOGRAPHY;  Surgeon: Jettie Booze, MD;  Location: [place] [facility];  Service: Cardiovascular;  Laterality: N/A;   SHOULDER ARTHROSCOPY WITH ROTATOR CUFF REPAIR AND OPEN BICEPS TENODESIS Right 11/28/2021   Procedure: RIGHT SHOULDER ARTHROSCOPY WITH ROTATOR CUFF REPAIR AND OPEN BICEPS TENODESIS;  Surgeon: Vanetta Mulders, MD;  Location: [place];  Service: Orthopedics;  Laterality: Right;   TONSILLECTOMY     TOTAL HIP ARTHROPLASTY Right 04/26/2018   Procedure: RIGHT TOTAL HIP ARTHROPLASTY ANTERIOR APPROACH;  Surgeon: Leandrew Koyanagi, MD;  Location: [place];  Service: Orthopedics;  Laterality: Right;   TOTAL HIP ARTHROPLASTY Left 11/15/2018   TOTAL HIP  ARTHROPLASTY Left 11/15/2018   Procedure: LEFT TOTAL HIP ARTHROPLASTY ANTERIOR APPROACH;  Surgeon: Leandrew Koyanagi, MD;  Location: [place];  Service: Orthopedics;  Laterality: Left;   Patient Active Problem List   Diagnosis Date Noted   Biceps tendinitis of right upper extremity    Rotator cuff syndrome of right shoulder 10/16/2021   Other insomnia 04/30/2021   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   COVID-19 vaccine regimen to maintain immunity completed 04/27/2020   Statin intolerance 12/10/2018   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/15/2018   Chronic left shoulder pain 06/08/2018   Status post total hip replacement, right 04/26/2018   Mild dilation of ascending aorta ([place]) 12/26/2017   Dyslipidemia 12/26/2017   Bilateral carotid bruits 12/26/2017   Nocturnal hypoxemia 06/29/2017   Primary osteoarthritis of both hips 06/29/2017   Chronic diastolic heart failure ([place])    Coronary artery disease involving native coronary artery of native heart without angina pectoris 03/31/2017   Ischemic cardiomyopathy 03/31/2017   Marijuana use 03/31/2017   Prediabetes 03/31/2017   Aortic valve insufficiency 12/28/2016   Mitral valve regurgitation 12/28/2016   Obesity 12/28/2016   Aortic valve stenosis, nonrheumatic    Essential hypertension     REFERRING PROVIDER: Vanetta Mulders, MD  REFERRING DIAG: M75.101 (ICD-10-CM) - Rotator cuff syndrome of right shoulder   THERAPY  DIAG:  Stiffness of right shoulder, not elsewhere classified  Muscle weakness (generalized)  Right shoulder pain, unspecified chronicity  Rationale for Evaluation and Treatment Rehabilitation  ONSET DATE: DOS 11/29/2019  Days since surgery: 146   SUBJECTIVE:                                                                                                                                                                                      SUBJECTIVE STATEMENT: Pt states the R shoulder is doing well today.  He has been doing light activity around the home and yard work. He still has some issues with going OH but the L one bothers him more. He will likely have surgery on it but he would like to work on the R one some more.   PERTINENT HISTORY: -supraspinatus and subscapularis repairs, biceps tenodesis, debridement of labrum, and subacromial decompression on 11/28/21 -L shoulder pain, CHF with coronary Stent placement, R and L THA in 2020, OA   PAIN:  Are you having pain? No NPRS:  Current:  0/10, Best:  3/10, Worst:  9/10.  Pt did have 2-3/10 pain while in the car due to positioning.  He had to reposition his UE and felt better.  Location:  R shoulder   PRECAUTIONS: Other: per surgical protocol, PROM  WEIGHT BEARING RESTRICTIONS Yes R UE  FALLS:  Has patient fallen in last 6 months? 1 fall, this injury  LIVING ENVIRONMENT: Lives with: lives alone   OCCUPATION: He is retired and on disability.   PLOF: Independent; Pt was able to perform his ADLs and IADLs and reaching activities independently.  PATIENT GOALS Reach top shelf, reach behind head.  OBJECTIVE:   DIAGNOSTIC FINDINGS:  Pt had x rays and MRI prior to surgery.   TODAY'S TREATMENT:  Pt performed: UBE L1 2 min fwd and 1 min retro  Exercises - Sidelying Shoulder ER with Towel and Dumbbell  - 1 x daily - 3 x weekly - 2 sets - 10 reps - Standing Shoulder Row with Anchored Resistance  - 1 x daily - 3 x weekly - 2 sets - 10 reps - Shoulder extension with resistance - Neutral  - 1 x daily - 3 x weekly - 2 sets - 10 reps - Sidelying Shoulder Abduction Palm Forward  - 1 x daily - 3 x weekly - 2 sets - 10 reps - Scaption with Dumbbells  - 1 x daily - 3 x weekly - 2 sets - 10 reps - Shoulder Flexion Wall Slide with Towel  - 1 x daily - 3 x weekly - 2 sets - 10 reps   PATIENT EDUCATION: Education details: anatomy,  exercise progression, DOMS expectations, muscle firing,  envelope of function, HEP, POC  Person educated:  Patient Education method: Explanation and Demonstration Education comprehension: verbalized understanding, returned demonstration, and needs further education   HOME EXERCISE PROGRAM: Access Code: [email or web address] URL: [email or web address] Date: 12/11/2021 Prepared by: Ronny Flurry  ASSESSMENT:  CLINICAL IMPRESSION: Pt still with R UE weakness and UT compensation/hiking today which likely is due compliance/memory of HEP. HEP reviewed today in full and pt advised to start working on strengthening OH and scapular setting in order to improve reaching strength. Plan to continue at a decreased frequency as pt would like to try independent strengthening at this time. Plan to recheck compliance and R UE strength in OH positions at next session.  Pt should benefit from cont skilled PT services per protocol to address impairments and to assist in restoring desired level of function.      OBJECTIVE IMPAIRMENTS decreased activity tolerance, decreased endurance, decreased ROM, decreased strength, hypomobility, impaired flexibility, impaired UE functional use, and pain.   ACTIVITY LIMITATIONS carrying, lifting, sleeping, bathing, dressing, reach over head, and hygiene/grooming  PARTICIPATION LIMITATIONS: meal prep, cleaning, laundry, driving, shopping, and community activity  PERSONAL FACTORS 1-2 comorbidities: L shoulder pain and OA  are also affecting patient's functional outcome.   REHAB POTENTIAL: Good  CLINICAL DECISION MAKING: Stable/uncomplicated  EVALUATION COMPLEXITY: Low   GOALS:   SHORT TERM GOALS: Target date:06/02/2022   Pt will be independent and compliant with HEP for improved pain, ROM, and strength.  Baseline: Goal status: MET    2.  Pt will tolerate PROM per protocol without adverse effects for improved ROM.  Baseline:  Goal status: MET   3.  Pt will demo PROM to 110-120 deg in flexion and 0 deg in ER for improved stiffness and mobility.  Baseline:  Goal  status: MET  4.  Pt will tolerate AAROM without adverse effects for progression of protocol.  Baseline:  Goal status: ongoing  5. Pt will demo supine shoulder AAROM to be 120 deg in flexion and in 40 deg in ER for progression of AAROM and protocol Baseline:  Goal status: ongoing   6.   Pt will wean out of sling per MD allowance without adverse effects Baseline:  Goal status: ongoing   7.  Pt will demo R shoulder flexion and scaption AROM to at least 100 deg in standing without significant shoulder hike for improved UE elevation. Baseline:  Goal status: ongoing   8.  Pt will be able to perform his self care activities with no > than minimal difficulty.   Baseline:  Goal status: ongoing        LONG TERM GOALS:   Pt will demo R shoulder AROM to be [facility] t/o for performance of ADLs and IADLs.  Baseline:  Goal status: ongoing Target date:  03/25/2022   2.  Pt will be able to perform his ADLs and IADLs without significant difficulty and pain. Baseline:  Goal status: ongoing Target date:  03/25/2022  3.  Pt will be able to perform his normal reaching and overhead activities without significant pain and limitation. Baseline:  Goal status: ongoing Target date:  03/25/2022  4.  Pt will demo at least 4/5 MMT strength t/o R shoulder for improved tolerance with and performance of daily activities, household chores, and functional lifting.  Baseline:  Goal status: ongoing Target Date:  03/25/2022  5.  Pt will be independent with advanced HEP for improved shoulder strength and stability to assist with returning  to desired level of function and performing functional carrying and lifting. Baseline:  Goal status: ongoing    PLAN: PT FREQUENCY: 1x/every other   PT DURATION: other: 12 weeks  PLANNED INTERVENTIONS: Therapeutic exercises, Therapeutic activity, Neuromuscular re-education, Patient/Family education, Self Care, Joint mobilization, Aquatic Therapy, Dry Needling,  Electrical stimulation, Spinal mobilization, Cryotherapy, Moist heat, scar mobilization, Taping, Ultrasound, Manual therapy, and Re-evaluation  PLAN FOR NEXT SESSION:  Cont with shoulder PROM per Dr. Eddie Dibbles protocol.  Pt had a subscapularis repair also, will use subscapularis repair protocol   Daleen Bo PT, DPT 04/23/22 11:44 AM

## 2022-05-08 ENCOUNTER — Encounter (HOSPITAL_BASED_OUTPATIENT_CLINIC_OR_DEPARTMENT_OTHER): Payer: Self-pay | Admitting: Physical Therapy

## 2022-05-13 ENCOUNTER — Other Ambulatory Visit: Payer: Self-pay | Admitting: Internal Medicine

## 2022-05-13 DIAGNOSIS — I10 Essential (primary) hypertension: Secondary | ICD-10-CM

## 2022-05-14 NOTE — Telephone Encounter (Signed)
Requested medication (s) are due for refill today: yes  Requested medication (s) are on the active medication list: yes  Last refill:  04/15/22  Future visit scheduled: no  Notes to clinic:  Unable to refill per protocol, courtesy refill already given, routing for provider approval.      Requested Prescriptions  Pending Prescriptions Disp Refills   amLODipine (NORVASC) 5 MG tablet [Pharmacy Med Name: AMLODIPINE BESYLATE 5 MG TAB] 90 tablet 1    Sig: TAKE 1 TABLET (5 MG TOTAL) BY MOUTH DAILY.     Cardiovascular: Calcium Channel Blockers 2 Passed - 05/13/2022  1:32 PM      Passed - Last BP in normal range    BP Readings from Last 1 Encounters:  01/09/22 103/69         Passed - Last Heart Rate in normal range    Pulse Readings from Last 1 Encounters:  01/09/22 74         Passed - Valid encounter within last 6 months    Recent Outpatient Visits           4 months ago Essential hypertension   [place], [place]   8 months ago Essential hypertension   [place], MD   1 year ago Essential hypertension   [place], Deborah B, MD   1 year ago Essential hypertension   [place], Deborah B, MD   1 year ago Essential hypertension   [place], MD       Future Appointments             In 1 month Vanetta Mulders, MD [place] [facility], [place]

## 2022-05-21 ENCOUNTER — Other Ambulatory Visit: Payer: Self-pay | Admitting: Internal Medicine

## 2022-05-21 DIAGNOSIS — I2510 Atherosclerotic heart disease of native coronary artery without angina pectoris: Secondary | ICD-10-CM

## 2022-05-21 DIAGNOSIS — I10 Essential (primary) hypertension: Secondary | ICD-10-CM

## 2022-05-22 ENCOUNTER — Ambulatory Visit (HOSPITAL_BASED_OUTPATIENT_CLINIC_OR_DEPARTMENT_OTHER): Payer: Medicaid Other | Attending: Orthopaedic Surgery | Admitting: Physical Therapy

## 2022-05-22 DIAGNOSIS — M25511 Pain in right shoulder: Secondary | ICD-10-CM | POA: Insufficient documentation

## 2022-05-22 DIAGNOSIS — M6281 Muscle weakness (generalized): Secondary | ICD-10-CM | POA: Insufficient documentation

## 2022-05-22 DIAGNOSIS — M25611 Stiffness of right shoulder, not elsewhere classified: Secondary | ICD-10-CM | POA: Insufficient documentation

## 2022-05-22 NOTE — Therapy (Incomplete)
OUTPATIENT PHYSICAL THERAPY TREATMENT NOTE   Patient Name: Joshua [name] MRN: [id number] DOB:11/27/1960, 62 y.o., male Today's Date: 05/22/2022               Past Medical History:  Diagnosis Date   Aortic stenosis    CAD (coronary artery disease), native coronary artery 12/26/2016   DES LAD & RCA, EF 25-30%   CHF (congestive heart failure) (HCC)    Dyslipidemia    Hypertension    Obesity    Osteoarthritis    Pre-diabetes    a. prior A1C 5.6, states he was on meds for this at one point.   Past Surgical History:  Procedure Laterality Date   CORONARY STENT INTERVENTION N/A 12/29/2016   Procedure: CORONARY STENT INTERVENTION;  Surgeon: Jettie Booze, MD;  Location: [facility];  Service: Cardiovascular;  Laterality: N/A;   RIGHT/LEFT HEART CATH AND CORONARY ANGIOGRAPHY N/A 12/29/2016   Procedure: RIGHT/LEFT HEART CATH AND CORONARY ANGIOGRAPHY;  Surgeon: Jettie Booze, MD;  Location: [facility];  Service: Cardiovascular;  Laterality: N/A;   SHOULDER ARTHROSCOPY WITH ROTATOR CUFF REPAIR AND OPEN BICEPS TENODESIS Right 11/28/2021   Procedure: RIGHT SHOULDER ARTHROSCOPY WITH ROTATOR CUFF REPAIR AND OPEN BICEPS TENODESIS;  Surgeon: Vanetta Mulders, MD;  Location: [facility];  Service: Orthopedics;  Laterality: Right;   TONSILLECTOMY     TOTAL HIP ARTHROPLASTY Right 04/26/2018   Procedure: RIGHT TOTAL HIP ARTHROPLASTY ANTERIOR APPROACH;  Surgeon: Leandrew Koyanagi, MD;  Location: [facility];  Service: Orthopedics;  Laterality: Right;   TOTAL HIP ARTHROPLASTY Left 11/15/2018   TOTAL HIP ARTHROPLASTY Left 11/15/2018   Procedure: LEFT TOTAL HIP ARTHROPLASTY ANTERIOR APPROACH;  Surgeon: Leandrew Koyanagi, MD;  Location: [facility];  Service: Orthopedics;  Laterality: Left;   Patient Active Problem List   Diagnosis Date Noted   Biceps tendinitis of right upper extremity    Rotator cuff syndrome of right shoulder 10/16/2021   Other insomnia 04/30/2021   Hepatotoxicity due to  statin drug 08/27/2020   COVID-19 vaccine regimen to maintain immunity completed 04/27/2020   Statin intolerance 12/10/2018   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/15/2018   Chronic left shoulder pain 06/08/2018   Status post total hip replacement, right 04/26/2018   Mild dilation of ascending aorta ([place]) 12/26/2017   Dyslipidemia 12/26/2017   Bilateral carotid bruits 12/26/2017   Nocturnal hypoxemia 06/29/2017   Primary osteoarthritis of both hips 06/29/2017   Chronic diastolic heart failure ([place])    Coronary artery disease involving native coronary artery of native heart without angina pectoris 03/31/2017   Ischemic cardiomyopathy 03/31/2017   Marijuana use 03/31/2017   Prediabetes 03/31/2017   Aortic valve insufficiency 12/28/2016   Mitral valve regurgitation 12/28/2016   Obesity 12/28/2016   Aortic valve stenosis, nonrheumatic    Essential hypertension     REFERRING PROVIDER: Vanetta Mulders, MD  REFERRING DIAG: M75.101 (ICD-10-CM) - Rotator cuff syndrome of right shoulder   THERAPY DIAG:  No diagnosis found.  Rationale for Evaluation and Treatment Rehabilitation  ONSET DATE: DOS 11/29/2019  Days since surgery: 175   SUBJECTIVE:  SUBJECTIVE STATEMENT: Pt states the R shoulder is doing well today. He has been doing light activity around the home and yard work. He still has some issues with going OH but the L one bothers him more. He will likely have surgery on it but he would like to work on the R one some more.   PERTINENT HISTORY: -supraspinatus and subscapularis repairs, biceps tenodesis, debridement of labrum, and subacromial decompression on 11/28/21 -L shoulder pain, CHF with coronary Stent placement, R and L THA in 2020, OA   PAIN:  Are you having pain? No NPRS:  Current:   0/10, Best:  3/10, Worst:  9/10.  Pt did have 2-3/10 pain while in the car due to positioning.  He had to reposition his UE and felt better.  Location:  R shoulder   PRECAUTIONS: Other: per surgical protocol, PROM  WEIGHT BEARING RESTRICTIONS Yes R UE  FALLS:  Has patient fallen in last 6 months? 1 fall, this injury  LIVING ENVIRONMENT: Lives with: lives alone   OCCUPATION: He is retired and on disability.   PLOF: Independent; Pt was able to perform his ADLs and IADLs and reaching activities independently.  PATIENT GOALS Reach top shelf, reach behind head.  OBJECTIVE:   DIAGNOSTIC FINDINGS:  Pt had x rays and MRI prior to surgery.   TODAY'S TREATMENT:  Pt performed: UBE L1 2 min fwd and 1 min retro  Exercises - Sidelying Shoulder ER with Towel and Dumbbell  - 1 x daily - 3 x weekly - 2 sets - 10 reps - Standing Shoulder Row with Anchored Resistance  - 1 x daily - 3 x weekly - 2 sets - 10 reps - Shoulder extension with resistance - Neutral  - 1 x daily - 3 x weekly - 2 sets - 10 reps - Sidelying Shoulder Abduction Palm Forward  - 1 x daily - 3 x weekly - 2 sets - 10 reps - Scaption with Dumbbells  - 1 x daily - 3 x weekly - 2 sets - 10 reps - Shoulder Flexion Wall Slide with Towel  - 1 x daily - 3 x weekly - 2 sets - 10 reps   PATIENT EDUCATION: Education details: anatomy, exercise progression, DOMS expectations, muscle firing,  envelope of function, HEP, POC  Person educated: Patient Education method: Customer service manager Education comprehension: verbalized understanding, returned demonstration, and needs further education   HOME EXERCISE PROGRAM: Access Code: [email or web address] URL: [email or web address] Date: 12/11/2021 Prepared by: Ronny Flurry  ASSESSMENT:  CLINICAL IMPRESSION: Pt still with R UE weakness and UT compensation/hiking today which likely is due compliance/memory of HEP. HEP reviewed today in full and pt advised to start working  on strengthening OH and scapular setting in order to improve reaching strength. Plan to continue at a decreased frequency as pt would like to try independent strengthening at this time. Plan to recheck compliance and R UE strength in OH positions at next session.  Pt should benefit from cont skilled PT services per protocol to address impairments and to assist in restoring desired level of function.      OBJECTIVE IMPAIRMENTS decreased activity tolerance, decreased endurance, decreased ROM, decreased strength, hypomobility, impaired flexibility, impaired UE functional use, and pain.   ACTIVITY LIMITATIONS carrying, lifting, sleeping, bathing, dressing, reach over head, and hygiene/grooming  PARTICIPATION LIMITATIONS: meal prep, cleaning, laundry, driving, shopping, and community activity  PERSONAL FACTORS 1-2 comorbidities: L shoulder pain and OA  are also affecting patient's functional outcome.   REHAB POTENTIAL:  Good  CLINICAL DECISION MAKING: Stable/uncomplicated  EVALUATION COMPLEXITY: Low   GOALS:   SHORT TERM GOALS: Target date:06/02/2022   Pt will be independent and compliant with HEP for improved pain, ROM, and strength.  Baseline: Goal status: MET    2.  Pt will tolerate PROM per protocol without adverse effects for improved ROM.  Baseline:  Goal status: MET   3.  Pt will demo PROM to 110-120 deg in flexion and 0 deg in ER for improved stiffness and mobility.  Baseline:  Goal status: MET  4.  Pt will tolerate AAROM without adverse effects for progression of protocol.  Baseline:  Goal status: ongoing  5. Pt will demo supine shoulder AAROM to be 120 deg in flexion and in 40 deg in ER for progression of AAROM and protocol Baseline:  Goal status: ongoing   6.   Pt will wean out of sling per MD allowance without adverse effects Baseline:  Goal status: ongoing   7.  Pt will demo R shoulder flexion and scaption AROM to at least 100 deg in standing without  significant shoulder hike for improved UE elevation. Baseline:  Goal status: ongoing   8.  Pt will be able to perform his self care activities with no > than minimal difficulty.   Baseline:  Goal status: ongoing        LONG TERM GOALS:   Pt will demo R shoulder AROM to be [facility] t/o for performance of ADLs and IADLs.  Baseline:  Goal status: ongoing Target date:  03/25/2022   2.  Pt will be able to perform his ADLs and IADLs without significant difficulty and pain. Baseline:  Goal status: ongoing Target date:  03/25/2022  3.  Pt will be able to perform his normal reaching and overhead activities without significant pain and limitation. Baseline:  Goal status: ongoing Target date:  03/25/2022  4.  Pt will demo at least 4/5 MMT strength t/o R shoulder for improved tolerance with and performance of daily activities, household chores, and functional lifting.  Baseline:  Goal status: ongoing Target Date:  03/25/2022  5.  Pt will be independent with advanced HEP for improved shoulder strength and stability to assist with returning to desired level of function and performing functional carrying and lifting. Baseline:  Goal status: ongoing    PLAN: PT FREQUENCY: 1x/every other   PT DURATION: other: 12 weeks  PLANNED INTERVENTIONS: Therapeutic exercises, Therapeutic activity, Neuromuscular re-education, Patient/Family education, Self Care, Joint mobilization, Aquatic Therapy, Dry Needling, Electrical stimulation, Spinal mobilization, Cryotherapy, Moist heat, scar mobilization, Taping, Ultrasound, Manual therapy, and Re-evaluation  PLAN FOR NEXT SESSION:  Cont with shoulder PROM per Dr. Eddie Dibbles protocol.  Pt had a subscapularis repair also, will use subscapularis repair protocol   Daleen Bo PT, DPT 05/22/22 10:31 AM

## 2022-06-04 ENCOUNTER — Ambulatory Visit (HOSPITAL_BASED_OUTPATIENT_CLINIC_OR_DEPARTMENT_OTHER): Payer: Medicaid Other | Admitting: Physical Therapy

## 2022-06-13 ENCOUNTER — Other Ambulatory Visit: Payer: Self-pay | Admitting: Internal Medicine

## 2022-06-13 DIAGNOSIS — I2510 Atherosclerotic heart disease of native coronary artery without angina pectoris: Secondary | ICD-10-CM

## 2022-06-15 ENCOUNTER — Other Ambulatory Visit: Payer: Self-pay | Admitting: Internal Medicine

## 2022-06-15 DIAGNOSIS — I10 Essential (primary) hypertension: Secondary | ICD-10-CM

## 2022-06-19 ENCOUNTER — Encounter (HOSPITAL_BASED_OUTPATIENT_CLINIC_OR_DEPARTMENT_OTHER): Payer: Self-pay | Admitting: Physical Therapy

## 2022-06-24 ENCOUNTER — Other Ambulatory Visit: Payer: Self-pay | Admitting: Internal Medicine

## 2022-06-24 DIAGNOSIS — I10 Essential (primary) hypertension: Secondary | ICD-10-CM

## 2022-06-24 NOTE — Telephone Encounter (Signed)
Requested medication (s) are due for refill today: yes  Requested medication (s) are on the active medication list: yes  Last refill:  05/21/22  Future visit scheduled: no  Notes to clinic:  Unable to refill per protocol, courtesy refill already given, routing for provider approval.      Requested Prescriptions  Pending Prescriptions Disp Refills   amLODipine (NORVASC) 5 MG tablet [Pharmacy Med Name: AMLODIPINE BESYLATE 5 MG TAB] 30 tablet 0    Sig: TAKE 1 TABLET (5 MG TOTAL) BY MOUTH DAILY.     Cardiovascular: Calcium Channel Blockers 2 Passed - 06/24/2022 10:06 AM      Passed - Last BP in normal range    BP Readings from Last 1 Encounters:  01/09/22 103/69         Passed - Last Heart Rate in normal range    Pulse Readings from Last 1 Encounters:  01/09/22 74         Passed - Valid encounter within last 6 months    Recent Outpatient Visits           5 months ago Essential hypertension   [place], MD   9 months ago Essential hypertension   [place], MD   1 year ago Essential hypertension   [place], MD   1 year ago Essential hypertension   [place], MD   1 year ago Essential hypertension   [place], MD       Future Appointments             In 1 week Vanetta Mulders, MD [place] at [facility], [place]             potassium chloride (KLOR-CON) 10 MEQ tablet [Pharmacy Med Name: POTASSIUM CL ER 10 MEQ TABLET] 30 tablet 0    Sig: TAKE 1 TABLET (10 MEQ TOTAL) BY MOUTH DAILY. DX: HYPOKALEMIA     Endocrinology:  Minerals - Potassium Supplementation Passed - 06/24/2022 10:06 AM      Passed - K in normal range and within 360 days    Potassium  Date Value Ref Range  Status  11/28/2021 4.1 3.5 - 5.1 mmol/L Final         Passed - Cr in normal range and within 360 days    Creatinine, Ser  Date Value Ref Range Status  11/28/2021 0.91 0.61 - 1.24 mg/dL Final         Passed - Valid encounter within last 12 months    Recent Outpatient Visits           5 months ago Essential hypertension   [place], MD   9 months ago Essential hypertension   [place], MD   1 year ago Essential hypertension   [place], MD   1 year ago Essential hypertension   [place], Deborah B, MD   1 year ago Essential hypertension   [place], MD       Future Appointments  In 1 week Vanetta Mulders, MD [place] at [facility], [place]

## 2022-07-01 ENCOUNTER — Other Ambulatory Visit: Payer: Self-pay | Admitting: Internal Medicine

## 2022-07-01 DIAGNOSIS — I10 Essential (primary) hypertension: Secondary | ICD-10-CM

## 2022-07-02 ENCOUNTER — Encounter (HOSPITAL_BASED_OUTPATIENT_CLINIC_OR_DEPARTMENT_OTHER): Payer: Medicaid Other | Admitting: Physical Therapy

## 2022-07-02 ENCOUNTER — Other Ambulatory Visit (HOSPITAL_BASED_OUTPATIENT_CLINIC_OR_DEPARTMENT_OTHER): Payer: Self-pay

## 2022-07-02 ENCOUNTER — Ambulatory Visit (INDEPENDENT_AMBULATORY_CARE_PROVIDER_SITE_OTHER): Payer: Medicaid Other | Admitting: Orthopaedic Surgery

## 2022-07-02 DIAGNOSIS — M75122 Complete rotator cuff tear or rupture of left shoulder, not specified as traumatic: Secondary | ICD-10-CM | POA: Diagnosis not present

## 2022-07-02 MED ORDER — ACETAMINOPHEN 500 MG PO TABS: 500.0000 mg | ORAL_TABLET | Freq: Three times a day (TID) | ORAL | 0 refills | Status: AC

## 2022-07-02 MED ORDER — ASPIRIN 325 MG PO TBEC: 325.0000 mg | DELAYED_RELEASE_TABLET | Freq: Every day | ORAL | 0 refills | Status: DC

## 2022-07-02 MED ORDER — IBUPROFEN 800 MG PO TABS: 800.0000 mg | ORAL_TABLET | Freq: Three times a day (TID) | ORAL | 0 refills | Status: AC

## 2022-07-02 MED ORDER — METHOCARBAMOL 500 MG PO TABS: 500.0000 mg | ORAL_TABLET | Freq: Four times a day (QID) | ORAL | 3 refills | Status: DC

## 2022-07-02 MED ORDER — OXYCODONE HCL 5 MG PO CAPS: 5.0000 mg | ORAL_CAPSULE | ORAL | 0 refills | Status: DC | PRN

## 2022-07-02 NOTE — Addendum Note (Signed)
Addended by: Raynelle Fanning A on: 07/02/2022 12:11 PM   Modules accepted: Orders

## 2022-07-02 NOTE — Progress Notes (Signed)
Post Operative Evaluation    Procedure/Date of Surgery: Right shoulder rotator cuff repair with biceps tenodesis 11/28/21  Interval History:    04/23/2022: Presents today for follow-up of the above procedure.  Overall he is dramatically improved with regard to the right shoulder.  His strength is improving quite significantly.  He is very happy with the shoulder.  He has no pain in the shoulder.  His only complaint is his left shoulder.  He has been continue to work in physical therapy to strengthen up the left shoulder.  He states that he is really not able to lift in any capacity on the side.  He has pain radiating down the side.  He is unable to the lay on the side.  This has become significantly inhibitive towards all of his normal daily activities or working outside.   PMH/PSH/Family History/Social History/Meds/Allergies:    Past Medical History:  Diagnosis Date   Aortic stenosis    CAD (coronary artery disease), native coronary artery 12/26/2016   DES LAD & RCA, EF 25-30%   CHF (congestive heart failure) (HCC)    Dyslipidemia    Hypertension    Obesity    Osteoarthritis    Pre-diabetes    a. prior A1C 5.6, states he was on meds for this at one point.   Past Surgical History:  Procedure Laterality Date   CORONARY STENT INTERVENTION N/A 12/29/2016   Procedure: CORONARY STENT INTERVENTION;  Surgeon: Jettie Booze, MD;  Location: [facility];  Service: Cardiovascular;  Laterality: N/A;   RIGHT/LEFT HEART CATH AND CORONARY ANGIOGRAPHY N/A 12/29/2016   Procedure: RIGHT/LEFT HEART CATH AND CORONARY ANGIOGRAPHY;  Surgeon: Jettie Booze, MD;  Location: [facility];  Service: Cardiovascular;  Laterality: N/A;   SHOULDER ARTHROSCOPY WITH ROTATOR CUFF REPAIR AND OPEN BICEPS TENODESIS Right 11/28/2021   Procedure: RIGHT SHOULDER ARTHROSCOPY WITH ROTATOR CUFF REPAIR AND OPEN BICEPS TENODESIS;  Surgeon: Vanetta Mulders, MD;   Location: [facility];  Service: Orthopedics;  Laterality: Right;   TONSILLECTOMY     TOTAL HIP ARTHROPLASTY Right 04/26/2018   Procedure: RIGHT TOTAL HIP ARTHROPLASTY ANTERIOR APPROACH;  Surgeon: Leandrew Koyanagi, MD;  Location: [facility];  Service: Orthopedics;  Laterality: Right;   TOTAL HIP ARTHROPLASTY Left 11/15/2018   TOTAL HIP ARTHROPLASTY Left 11/15/2018   Procedure: LEFT TOTAL HIP ARTHROPLASTY ANTERIOR APPROACH;  Surgeon: Leandrew Koyanagi, MD;  Location: [facility];  Service: Orthopedics;  Laterality: Left;   Social History   Socioeconomic History   Marital status: Single    Spouse name: Not on file   Number of children: Not on file   Years of education: Not on file   Highest education level: Not on file  Occupational History   Not on file  Tobacco Use   Smoking status: Former   Smokeless tobacco: Never   Tobacco comments:    Smoked lightly for approx 20 years, quit 1990s  Vaping Use   Vaping Use: Never used  Substance and Sexual Activity   Alcohol use: Not Currently   Drug use: Yes    Types: Marijuana    Comment: every night (to sleep)   Sexual activity: Not on file  Other Topics Concern   Not on file  Social History Narrative   Lives Home alone. Sister makes all health  related decisions. Declined Advanced directive at this time   Social Determinants of Health   Financial Resource Strain: Not on file  Food Insecurity: Not on file  Transportation Needs: Not on file  Physical Activity: Not on file  Stress: Not on file  Social Connections: Not on file   Family History  Problem Relation Age of Onset   CAD Father        MI/CABG age 66   Allergies  Allergen Reactions   Statins Other (See Comments)    Elevated LFTs   Current Outpatient Medications  Medication Sig Dispense Refill   amLODipine (NORVASC) 5 MG tablet TAKE 1 TABLET (5 MG TOTAL) BY MOUTH DAILY. 30 tablet 0   aspirin EC 325 MG tablet TAKE 1 TABLET BY MOUTH EVERY DAY 30 tablet 0   carvedilol (COREG) 3.125 MG tablet  Take 1 tablet (3.125 mg total) by mouth 2 (two) times daily. 180 tablet 2   Evolocumab (REPATHA SURECLICK) [id number] MG/ML SOAJ Inject 1 mL into the skin every 14 (fourteen) days. 6 mL 3   fluticasone (FLONASE) 50 MCG/ACT nasal spray PLACE 1 SPRAY INTO BOTH NOSTRILS DAILY AS NEEDED FOR ALLERGIES OR RHINITIS. 48 mL 1   ibuprofen (ADVIL) 800 MG tablet TAKE 1 TABLET BY MOUTH EVERY 8 HOURS FOR 10 DAYS. TAKE WITH FOOD, ALTERNATE WITH ACETAMINOPHEN 30 tablet 0   lisinopril (ZESTRIL) 10 MG tablet TAKE 1 TABLET BY MOUTH TWICE A DAY 180 tablet 1   loratadine (CLARITIN) 10 MG tablet Take 10 mg by mouth daily.     methocarbamol (ROBAXIN) 500 MG tablet Take 1 tablet (500 mg total) by mouth 2 (two) times daily as needed for muscle spasms. 20 tablet 2   Multiple Vitamins-Minerals (PRESERVISION AREDS 2) CAPS Take 1 capsule by mouth 2 (two) times daily.     oxyCODONE (ROXICODONE) 5 MG immediate release tablet Take 1 tablet (5 mg total) by mouth every 4 (four) hours as needed for severe pain. 30 tablet 0   potassium chloride (KLOR-CON) 10 MEQ tablet TAKE 1 TABLET (10 MEQ TOTAL) BY MOUTH DAILY. DX: HYPOKALEMIA 30 tablet 0   No current facility-administered medications for this visit.   No results found.  Review of Systems:   A ROS was performed including pertinent positives and negatives as documented in the HPI.   Musculoskeletal Exam:    There were no vitals taken for this visit.  Right shoulder incisions are well-appearing.  No erythema or drainage.  Active forward elevation on the right shoulder is to 165 degrees, external rotation at side actively is to 60 degrees.  Internal rotation is to L1 bilaterally.  On the left there is 4 out of 5 strength with forward elevation in the supraspinatus distribution.  The left side has approximately 170 degrees of forward elevation with 60 degrees of external rotation at the side, positive Neer impingement, negative belly press.  Tenderness over the lateral deltoid.     Imaging:    MRI left shoulder: There is a full-thickness tear of the supraspinatus tendon without retraction.  There is significant tearing within the biceps tendon  I personally reviewed and interpreted the radiographs.   Assessment:   6 months status post right shoulder rotator cuff repair and biceps tenodesis doing well.  At this time his strength is dramatically improved.  He has now essentially improved strength compared to the contralateral side.  I being said his left shoulder remains symptomatic.  He has very difficult time sleeping on the side he  has been working with physical therapy to attempt to strengthen the left shoulder although none of this is giving him persistent relief.  Side effect I did discuss additional treatment options.  I have offered him an ultrasound-guided steroid injection of the left shoulder although he is not wishing to pursue this route as he is looking for more of a definitive solution given the fact that multiple months of therapy is not relieving his shoulder pain.  To that effect I have discussed left shoulder arthroscopy with rotator cuff repair and biceps tenodesis.  He would like to proceed with this.  Plan :    -Plan for left shoulder arthroscopy and biceps tenodesis   After a lengthy discussion of treatment options, including risks, benefits, alternatives, complications of surgical and nonsurgical conservative options, the patient elected surgical repair.   The patient  is aware of the material risks  and complications including, but not limited to injury to adjacent structures, neurovascular injury, infection, numbness, bleeding, implant failure, thermal burns, stiffness, persistent pain, failure to heal, disease transmission from allograft, need for further surgery, dislocation, anesthetic risks, blood clots, risks of death,and others. The probabilities of surgical success and failure discussed with patient given their particular co-morbidities.The  time and nature of expected rehabilitation and recovery was discussed.The patient's questions were all answered preoperatively.  No barriers to understanding were noted. I explained the natural history of the disease process and Rx rationale.  I explained to the patient what I considered to be reasonable expectations given their personal situation.  The final treatment plan was arrived at through a shared patient decision making process model.       I personally saw and evaluated the patient, and participated in the management and treatment plan.  Vanetta Mulders, MD Attending Physician, Orthopedic Surgery  This document was dictated using Dragon voice recognition software. A reasonable attempt at proof reading has been made to minimize errors.

## 2022-07-02 NOTE — H&P (View-Only) (Signed)
                               Post Operative Evaluation    Procedure/Date of Surgery: Right shoulder rotator cuff repair with biceps tenodesis 11/28/21  Interval History:    04/23/2022: Presents today for follow-up of the above procedure.  Overall he is dramatically improved with regard to the right shoulder.  His strength is improving quite significantly.  He is very happy with the shoulder.  He has no pain in the shoulder.  His only complaint is his left shoulder.  He has been continue to work in physical therapy to strengthen up the left shoulder.  He states that he is really not able to lift in any capacity on the side.  He has pain radiating down the side.  He is unable to the lay on the side.  This has become significantly inhibitive towards all of his normal daily activities or working outside.   PMH/PSH/Family History/Social History/Meds/Allergies:    Past Medical History:  Diagnosis Date   Aortic stenosis    CAD (coronary artery disease), native coronary artery 12/26/2016   DES LAD & RCA, EF 25-30%   CHF (congestive heart failure) (HCC)    Dyslipidemia    Hypertension    Obesity    Osteoarthritis    Pre-diabetes    a. prior A1C 5.6, states he was on meds for this at one point.   Past Surgical History:  Procedure Laterality Date   CORONARY STENT INTERVENTION N/A 12/29/2016   Procedure: CORONARY STENT INTERVENTION;  Surgeon: Varanasi, Jayadeep S, MD;  Location: [facility];  Service: Cardiovascular;  Laterality: N/A;   RIGHT/LEFT HEART CATH AND CORONARY ANGIOGRAPHY N/A 12/29/2016   Procedure: RIGHT/LEFT HEART CATH AND CORONARY ANGIOGRAPHY;  Surgeon: Varanasi, Jayadeep S, MD;  Location: [facility];  Service: Cardiovascular;  Laterality: N/A;   SHOULDER ARTHROSCOPY WITH ROTATOR CUFF REPAIR AND OPEN BICEPS TENODESIS Right 11/28/2021   Procedure: RIGHT SHOULDER ARTHROSCOPY WITH ROTATOR CUFF REPAIR AND OPEN BICEPS TENODESIS;  Surgeon: Sharyon Peitz, MD;   Location: [facility];  Service: Orthopedics;  Laterality: Right;   TONSILLECTOMY     TOTAL HIP ARTHROPLASTY Right 04/26/2018   Procedure: RIGHT TOTAL HIP ARTHROPLASTY ANTERIOR APPROACH;  Surgeon: Xu, Naiping M, MD;  Location: [facility];  Service: Orthopedics;  Laterality: Right;   TOTAL HIP ARTHROPLASTY Left 11/15/2018   TOTAL HIP ARTHROPLASTY Left 11/15/2018   Procedure: LEFT TOTAL HIP ARTHROPLASTY ANTERIOR APPROACH;  Surgeon: Xu, Naiping M, MD;  Location: [facility];  Service: Orthopedics;  Laterality: Left;   Social History   Socioeconomic History   Marital status: Single    Spouse name: Not on file   Number of children: Not on file   Years of education: Not on file   Highest education level: Not on file  Occupational History   Not on file  Tobacco Use   Smoking status: Former   Smokeless tobacco: Never   Tobacco comments:    Smoked lightly for approx 20 years, quit 1990s  Vaping Use   Vaping Use: Never used  Substance and Sexual Activity   Alcohol use: Not Currently   Drug use: Yes    Types: Marijuana    Comment: every night (to sleep)   Sexual activity: Not on file  Other Topics Concern   Not on file  Social History Narrative   Lives Home alone. Sister makes all health   related decisions. Declined Advanced directive at this time   Social Determinants of Health   Financial Resource Strain: Not on file  Food Insecurity: Not on file  Transportation Needs: Not on file  Physical Activity: Not on file  Stress: Not on file  Social Connections: Not on file   Family History  Problem Relation Age of Onset   CAD Father        MI/CABG age 60   Allergies  Allergen Reactions   Statins Other (See Comments)    Elevated LFTs   Current Outpatient Medications  Medication Sig Dispense Refill   amLODipine (NORVASC) 5 MG tablet TAKE 1 TABLET (5 MG TOTAL) BY MOUTH DAILY. 30 tablet 0   aspirin EC 325 MG tablet TAKE 1 TABLET BY MOUTH EVERY DAY 30 tablet 0   carvedilol (COREG) 3.125 MG tablet  Take 1 tablet (3.125 mg total) by mouth 2 (two) times daily. 180 tablet 2   Evolocumab (REPATHA SURECLICK) 140 MG/ML SOAJ Inject 1 mL into the skin every 14 (fourteen) days. 6 mL 3   fluticasone (FLONASE) 50 MCG/ACT nasal spray PLACE 1 SPRAY INTO BOTH NOSTRILS DAILY AS NEEDED FOR ALLERGIES OR RHINITIS. 48 mL 1   ibuprofen (ADVIL) 800 MG tablet TAKE 1 TABLET BY MOUTH EVERY 8 HOURS FOR 10 DAYS. TAKE WITH FOOD, ALTERNATE WITH ACETAMINOPHEN 30 tablet 0   lisinopril (ZESTRIL) 10 MG tablet TAKE 1 TABLET BY MOUTH TWICE A DAY 180 tablet 1   loratadine (CLARITIN) 10 MG tablet Take 10 mg by mouth daily.     methocarbamol (ROBAXIN) 500 MG tablet Take 1 tablet (500 mg total) by mouth 2 (two) times daily as needed for muscle spasms. 20 tablet 2   Multiple Vitamins-Minerals (PRESERVISION AREDS 2) CAPS Take 1 capsule by mouth 2 (two) times daily.     oxyCODONE (ROXICODONE) 5 MG immediate release tablet Take 1 tablet (5 mg total) by mouth every 4 (four) hours as needed for severe pain. 30 tablet 0   potassium chloride (KLOR-CON) 10 MEQ tablet TAKE 1 TABLET (10 MEQ TOTAL) BY MOUTH DAILY. DX: HYPOKALEMIA 30 tablet 0   No current facility-administered medications for this visit.   No results found.  Review of Systems:   A ROS was performed including pertinent positives and negatives as documented in the HPI.   Musculoskeletal Exam:    There were no vitals taken for this visit.  Right shoulder incisions are well-appearing.  No erythema or drainage.  Active forward elevation on the right shoulder is to 165 degrees, external rotation at side actively is to 60 degrees.  Internal rotation is to L1 bilaterally.  On the left there is 4 out of 5 strength with forward elevation in the supraspinatus distribution.  The left side has approximately 170 degrees of forward elevation with 60 degrees of external rotation at the side, positive Neer impingement, negative belly press.  Tenderness over the lateral deltoid.     Imaging:    MRI left shoulder: There is a full-thickness tear of the supraspinatus tendon without retraction.  There is significant tearing within the biceps tendon  I personally reviewed and interpreted the radiographs.   Assessment:   6 months status post right shoulder rotator cuff repair and biceps tenodesis doing well.  At this time his strength is dramatically improved.  He has now essentially improved strength compared to the contralateral side.  I being said his left shoulder remains symptomatic.  He has very difficult time sleeping on the side he   has been working with physical therapy to attempt to strengthen the left shoulder although none of this is giving him persistent relief.  Side effect I did discuss additional treatment options.  I have offered him an ultrasound-guided steroid injection of the left shoulder although he is not wishing to pursue this route as he is looking for more of a definitive solution given the fact that multiple months of therapy is not relieving his shoulder pain.  To that effect I have discussed left shoulder arthroscopy with rotator cuff repair and biceps tenodesis.  He would like to proceed with this.  Plan :    -Plan for left shoulder arthroscopy and biceps tenodesis   After a lengthy discussion of treatment options, including risks, benefits, alternatives, complications of surgical and nonsurgical conservative options, the patient elected surgical repair.   The patient  is aware of the material risks  and complications including, but not limited to injury to adjacent structures, neurovascular injury, infection, numbness, bleeding, implant failure, thermal burns, stiffness, persistent pain, failure to heal, disease transmission from allograft, need for further surgery, dislocation, anesthetic risks, blood clots, risks of death,and others. The probabilities of surgical success and failure discussed with patient given their particular co-morbidities.The  time and nature of expected rehabilitation and recovery was discussed.The patient's questions were all answered preoperatively.  No barriers to understanding were noted. I explained the natural history of the disease process and Rx rationale.  I explained to the patient what I considered to be reasonable expectations given their personal situation.  The final treatment plan was arrived at through a shared patient decision making process model.       I personally saw and evaluated the patient, and participated in the management and treatment plan.  Bernie Ransford, MD Attending Physician, Orthopedic Surgery  This document was dictated using Dragon voice recognition software. A reasonable attempt at proof reading has been made to minimize errors. 

## 2022-07-14 ENCOUNTER — Encounter (HOSPITAL_BASED_OUTPATIENT_CLINIC_OR_DEPARTMENT_OTHER): Payer: Self-pay | Admitting: Orthopaedic Surgery

## 2022-07-14 ENCOUNTER — Other Ambulatory Visit: Payer: Self-pay

## 2022-07-15 ENCOUNTER — Telehealth: Payer: Self-pay | Admitting: *Deleted

## 2022-07-15 NOTE — Telephone Encounter (Signed)
   Name: Joshua [name]  DOB: 14-Dec-1960  MRN: [id number]  Primary Cardiologist: Sanda Klein, MD   Preoperative team, please contact this patient and set up a phone call appointment for further preoperative risk assessment. Please obtain consent and complete medication review. Thank you for your help.  I confirm that guidance regarding antiplatelet and oral anticoagulation therapy has been completed and, if necessary, noted below.  Patient can hold ASA if required for 7 days prior to procedure.  Please restart when deemed safest post procedure.    Mable Fill, Marissa Nestle, NP 07/15/2022, 12:14 PM [facility]

## 2022-07-15 NOTE — Telephone Encounter (Signed)
  Patient Consent for Virtual Visit         Joshua [name] has provided verbal consent on 07/15/2022 for a virtual visit (video or telephone).   CONSENT FOR VIRTUAL VISIT FOR:  Joshua [name]  By participating in this virtual visit I agree to the following:  I hereby voluntarily request, consent and authorize [facility] and its employed or contracted physicians, physician assistants, nurse practitioners or other licensed health care professionals (the Practitioner), to provide me with telemedicine health care services (the "Services") as deemed necessary by the treating Practitioner. I acknowledge and consent to receive the Services by the Practitioner via telemedicine. I understand that the telemedicine visit will involve communicating with the Practitioner through live audiovisual communication technology and the disclosure of certain medical information by electronic transmission. I acknowledge that I have been given the opportunity to request an in-person assessment or other available alternative prior to the telemedicine visit and am voluntarily participating in the telemedicine visit.  I understand that I have the right to withhold or withdraw my consent to the use of telemedicine in the course of my care at any time, without affecting my right to future care or treatment, and that the Practitioner or I may terminate the telemedicine visit at any time. I understand that I have the right to inspect all information obtained and/or recorded in the course of the telemedicine visit and may receive copies of available information for a reasonable fee.  I understand that some of the potential risks of receiving the Services via telemedicine include:  Delay or interruption in medical evaluation due to technological equipment failure or disruption; Information transmitted may not be sufficient (e.g. poor resolution of images) to allow for appropriate medical decision making by the  Practitioner; and/or  In rare instances, security protocols could fail, causing a breach of personal health information.  Furthermore, I acknowledge that it is my responsibility to provide information about my medical history, conditions and care that is complete and accurate to the best of my ability. I acknowledge that Practitioner's advice, recommendations, and/or decision may be based on factors not within their control, such as incomplete or inaccurate data provided by me or distortions of diagnostic images or specimens that may result from electronic transmissions. I understand that the practice of medicine is not an exact science and that Practitioner makes no warranties or guarantees regarding treatment outcomes. I acknowledge that a copy of this consent can be made available to me via my patient portal ([facility]), or I can request a printed copy by calling the office of [facility].    I understand that my insurance will be billed for this visit.   I have read or had this consent read to me. I understand the contents of this consent, which adequately explains the benefits and risks of the Services being provided via telemedicine.  I have been provided ample opportunity to ask questions regarding this consent and the Services and have had my questions answered to my satisfaction. I give my informed consent for the services to be provided through the use of telemedicine in my medical care

## 2022-07-15 NOTE — Progress Notes (Signed)
Chart reviewed with Dr. Roanna Banning due to cardiac history and moderate aoric stenosis.  Per Dr. Roanna Banning patient will need new echo (planned for 07/23/2022) before proceeding with surgery. Message left with Dr. Roger Shelter surgery scheduler to see if they can get the echo completed and read before surgery date, Dr. Roanna Banning will re-review the chart, otherwise the surgery will need to be postponed until after echo is completed and read.

## 2022-07-15 NOTE — Telephone Encounter (Signed)
   Pre-operative Risk Assessment    Patient Name: Joshua [name]  DOB: 06-11-1960 MRN: [id number]      Request for Surgical Clearance    Procedure:   LEFT SHOULDER ARTHROSCOPY WITH ROTATOR CUFF REPAIR, BICEPS TENODESIS   Date of Surgery:  Clearance 07/21/22                                Surgeon:  Vanetta Mulders, MD Surgeon's Group or Practice Name:  [facility] Phone number:  [phone] Fax number:  [phone]   Type of Clearance Requested:   - Medical  - Pharmacy:  Hold Aspirin NOT INDICATED   Type of Anesthesia:   ANESTHESIA / REGIONAL BLOCK   Additional requests/questions:    [name]   07/15/2022, 11:54 AM

## 2022-07-16 ENCOUNTER — Telehealth: Payer: Self-pay | Admitting: Cardiovascular Disease

## 2022-07-16 ENCOUNTER — Other Ambulatory Visit (HOSPITAL_BASED_OUTPATIENT_CLINIC_OR_DEPARTMENT_OTHER): Payer: Medicaid Other

## 2022-07-16 ENCOUNTER — Ambulatory Visit (HOSPITAL_BASED_OUTPATIENT_CLINIC_OR_DEPARTMENT_OTHER): Payer: Medicaid Other

## 2022-07-16 DIAGNOSIS — I350 Nonrheumatic aortic (valve) stenosis: Secondary | ICD-10-CM

## 2022-07-16 LAB — ECHOCARDIOGRAM COMPLETE
AR max vel: 0.55 cm2
AV Area VTI: 0.61 cm2
AV Area mean vel: 0.55 cm2
AV Mean grad: 42 mmHg
AV Peak grad: 64 mmHg
Ao pk vel: 4 m/s
Area-P 1/2: 4.31 cm2
P 1/2 time: 351 msec
S' Lateral: 3.55 cm

## 2022-07-16 NOTE — Telephone Encounter (Signed)
Per Dr. Sallyanne Kuster patient will be evaluated in office prior to surgical clearance.  Virtual visit will be canceled and patient was made aware.   Ambrose Pancoast, NP

## 2022-07-16 NOTE — Progress Notes (Deleted)
{Choose 1 Note Type (Telehealth Visit or Telephone Visit):[phone]}  Evaluation Performed:  Preoperative cardiovascular risk assessment _____________   Date:  07/16/2022   Patient ID:  Joshua [name], DOB [birth year], MRN [id number] Patient Location:  Home Provider location:   Office  Primary Care Provider:  Ladell Pier, MD Primary Cardiologist:  Sanda Klein, MD  Chief Complaint / Patient Profile   62 y.o. y/o male with a h/o *** who is pending *** and presents today for telephonic preoperative cardiovascular risk assessment.  History of Present Illness    Joshua [name] is a 62 y.o. male who presents via audio/video conferencing for a telehealth visit today.  Pt was last seen in cardiology clinic on *** by ***.  At that time CAIDYN DELGROSSO was doing well ***.  The patient is now pending procedure as outlined above. Since his last visit, he ***  Past Medical History    Past Medical History:  Diagnosis Date   Aortic stenosis    CAD (coronary artery disease), native coronary artery 12/26/2016   DES LAD & RCA, EF 25-30%   CHF (congestive heart failure)    Dyslipidemia    Hypertension    Obesity    Osteoarthritis    Pre-diabetes    a. prior A1C 5.6, states he was on meds for this at one point.   Past Surgical History:  Procedure Laterality Date   CORONARY STENT INTERVENTION N/A 12/29/2016   Procedure: CORONARY STENT INTERVENTION;  Surgeon: Jettie Booze, MD;  Location: [facility];  Service: Cardiovascular;  Laterality: N/A;   RIGHT/LEFT HEART CATH AND CORONARY ANGIOGRAPHY N/A 12/29/2016   Procedure: RIGHT/LEFT HEART CATH AND CORONARY ANGIOGRAPHY;  Surgeon: Jettie Booze, MD;  Location: [facility];  Service: Cardiovascular;  Laterality: N/A;   SHOULDER ARTHROSCOPY WITH ROTATOR CUFF REPAIR AND OPEN BICEPS TENODESIS Right 11/28/2021   Procedure: RIGHT SHOULDER ARTHROSCOPY WITH ROTATOR CUFF REPAIR AND OPEN BICEPS TENODESIS;   Surgeon: Vanetta Mulders, MD;  Location: [facility];  Service: Orthopedics;  Laterality: Right;   TONSILLECTOMY     TOTAL HIP ARTHROPLASTY Right 04/26/2018   Procedure: RIGHT TOTAL HIP ARTHROPLASTY ANTERIOR APPROACH;  Surgeon: Leandrew Koyanagi, MD;  Location: [facility];  Service: Orthopedics;  Laterality: Right;   TOTAL HIP ARTHROPLASTY Left 11/15/2018   TOTAL HIP ARTHROPLASTY Left 11/15/2018   Procedure: LEFT TOTAL HIP ARTHROPLASTY ANTERIOR APPROACH;  Surgeon: Leandrew Koyanagi, MD;  Location: [facility];  Service: Orthopedics;  Laterality: Left;    Allergies  Allergies  Allergen Reactions   Statins Other (See Comments)    Elevated LFTs    Home Medications    Prior to Admission medications   Medication Sig Start Date End Date Taking? Authorizing Provider  amLODipine (NORVASC) 5 MG tablet TAKE 1 TABLET (5 MG TOTAL) BY MOUTH DAILY. 06/24/22   Ladell Pier, MD  aspirin EC 81 MG tablet Take 81 mg by mouth daily. Swallow whole.    [provider]  carvedilol (COREG) 3.125 MG tablet Take 1 tablet (3.125 mg total) by mouth 2 (two) times daily. 09/10/21   Ladell Pier, MD  Evolocumab (REPATHA SURECLICK) [id number] MG/ML SOAJ Inject 1 mL into the skin every 14 (fourteen) days. 12/05/21   Croitoru, Mihai, MD  fluticasone (FLONASE) 50 MCG/ACT nasal spray PLACE 1 SPRAY INTO BOTH NOSTRILS DAILY AS NEEDED FOR ALLERGIES OR RHINITIS. 02/06/22   Ladell Pier, MD  ibuprofen (ADVIL) 800 MG tablet TAKE 1 TABLET BY MOUTH  EVERY 8 HOURS FOR 10 DAYS. TAKE WITH FOOD, ALTERNATE WITH ACETAMINOPHEN 12/13/21   Vanetta Mulders, MD  lisinopril (ZESTRIL) 10 MG tablet TAKE 1 TABLET BY MOUTH TWICE A DAY 02/17/22   Ladell Pier, MD  loratadine (CLARITIN) 10 MG tablet Take 10 mg by mouth daily.    [provider]  methocarbamol (ROBAXIN) 500 MG tablet Take 1 tablet (500 mg total) by mouth 2 (two) times daily as needed for muscle spasms. 10/01/21   Aundra Dubin, PA-C  oxyCODONE (ROXICODONE) 5 MG immediate release  tablet Take 1 tablet (5 mg total) by mouth every 4 (four) hours as needed for severe pain. 10/29/21   Vanetta Mulders, MD  potassium chloride (KLOR-CON) 10 MEQ tablet TAKE 1 TABLET (10 MEQ TOTAL) BY MOUTH DAILY. DX: HYPOKALEMIA 06/24/22   Ladell Pier, MD    Physical Exam    Vital Signs:  Rita Ohara does not have vital signs available for review today.***  Given telephonic nature of communication, physical exam is limited. AAOx3. NAD. Normal affect.  Speech and respirations are unlabored.  Accessory Clinical Findings    None  Assessment & Plan    1.  Preoperative Cardiovascular Risk Assessment:  The patient was advised that if he develops new symptoms prior to surgery to contact our office to arrange for a follow-up visit, and he verbalized understanding.  (Reminder: Include SBE prophylaxis/Antiplatelet/Anticoag Instructions***)  A copy of this note will be routed to requesting surgeon.  Time:   Today, I have spent *** minutes with the patient with telehealth technology discussing medical history, symptoms, and management plan.     Deberah Pelton, NP  07/16/2022, 7:36 AM

## 2022-07-16 NOTE — Telephone Encounter (Signed)
Per Dr. Loletha Grayer, patient needs to be added to his schedule on 07/18/22 at 8:30 (slot held) Appt notes: discuss echo/pre-op  Left message to call back

## 2022-07-16 NOTE — Progress Notes (Signed)
Patient's chart reviewed with Dr Annye English new ECHO results from today showed that aortic stenosis has progressed and is now severe. Patient has an appointment to see cards on 07-18-22 for clearance. Dr Gifford Shave states that surgery will need to be done at [facility] if cleared by cards. April at Dr Eddie Dibbles office made aware.

## 2022-07-17 ENCOUNTER — Ambulatory Visit: Payer: Medicaid Other

## 2022-07-17 ENCOUNTER — Other Ambulatory Visit: Payer: Self-pay | Admitting: Internal Medicine

## 2022-07-17 DIAGNOSIS — I10 Essential (primary) hypertension: Secondary | ICD-10-CM

## 2022-07-17 NOTE — Telephone Encounter (Signed)
Requested medication (s) are due for refill today: yes  Requested medication (s) are on the active medication list: yes  Last refill:  06/24/22  Future visit scheduled: no  Notes to clinic:  Unable to refill per protocol, courtesy refill already given, routing for provider approval.      Requested Prescriptions  Pending Prescriptions Disp Refills   amLODipine (NORVASC) 5 MG tablet [Pharmacy Med Name: AMLODIPINE BESYLATE 5 MG TAB] 90 tablet 1    Sig: TAKE 1 TABLET (5 MG TOTAL) BY MOUTH DAILY.     Cardiovascular: Calcium Channel Blockers 2 Failed - 07/17/2022  1:32 PM      Failed - Valid encounter within last 6 months    Recent Outpatient Visits           6 months ago Essential hypertension   [facility] Ladell Pier, MD   10 months ago Essential hypertension   [facility] Ladell Pier, MD   1 year ago Essential hypertension   [place] [place] Ladell Pier, MD   1 year ago Essential hypertension   [facility] Ladell Pier, MD   1 year ago Essential hypertension   [place], Deborah B, MD       Future Appointments             Tomorrow Croitoru, Dani Gobble, MD [facility] BP in normal range    BP Readings from Last 1 Encounters:  01/09/22 103/69         Passed - Last Heart Rate in normal range    Pulse Readings from Last 1 Encounters:  01/09/22 74

## 2022-07-17 NOTE — Telephone Encounter (Signed)
Called to confirm pt coming to in person apt 07/18/22 at 0830; pt confirmed

## 2022-07-18 ENCOUNTER — Encounter (HOSPITAL_COMMUNITY): Payer: Self-pay | Admitting: Orthopaedic Surgery

## 2022-07-18 ENCOUNTER — Encounter: Payer: Self-pay | Admitting: Cardiovascular Disease

## 2022-07-18 ENCOUNTER — Ambulatory Visit: Payer: Medicaid Other | Attending: Cardiovascular Disease | Admitting: Cardiovascular Disease

## 2022-07-18 ENCOUNTER — Other Ambulatory Visit: Payer: Self-pay

## 2022-07-18 VITALS — BP 120/74 | HR 74 | Ht 73.0 in | Wt 224.9 lb

## 2022-07-18 DIAGNOSIS — I10 Essential (primary) hypertension: Secondary | ICD-10-CM | POA: Diagnosis not present

## 2022-07-18 DIAGNOSIS — I5032 Chronic diastolic (congestive) heart failure: Secondary | ICD-10-CM

## 2022-07-18 DIAGNOSIS — R7303 Prediabetes: Secondary | ICD-10-CM

## 2022-07-18 DIAGNOSIS — I350 Nonrheumatic aortic (valve) stenosis: Secondary | ICD-10-CM

## 2022-07-18 DIAGNOSIS — I2510 Atherosclerotic heart disease of native coronary artery without angina pectoris: Secondary | ICD-10-CM

## 2022-07-18 DIAGNOSIS — E785 Hyperlipidemia, unspecified: Secondary | ICD-10-CM | POA: Diagnosis not present

## 2022-07-18 DIAGNOSIS — Z01810 Encounter for preprocedural cardiovascular examination: Secondary | ICD-10-CM

## 2022-07-18 DIAGNOSIS — I77810 Thoracic aortic ectasia: Secondary | ICD-10-CM

## 2022-07-18 NOTE — Patient Instructions (Signed)
Medication Instructions:  No changes *If you need a refill on your cardiac medications before your next appointment, please call your pharmacy*  Follow-Up: At [facility], you and your health needs are our priority.  As part of our continuing mission to provide you with exceptional heart care, we have created designated Provider Care Teams.  These Care Teams include your primary Cardiologist (physician) and Advanced Practice Providers (APPs -  Physician Assistants and Nurse Practitioners) who all work together to provide you with the care you need, when you need it.  We recommend signing up for the patient portal called "MyChart".  Sign up information is provided on this After Visit Summary.  MyChart is used to connect with patients for Virtual Visits (Telemedicine).  Patients are able to view lab/test results, encounter notes, upcoming appointments, etc.  Non-urgent messages can be sent to your provider as well.   To learn more about what you can do with MyChart, go to [email or web address].    Your next appointment:   6 month(s)  Provider:   Thurmon Fair, MD     Other Instructions Referral to Structural [facility]

## 2022-07-18 NOTE — Progress Notes (Signed)
Spoke with pt for pre-op call. Pt has hx of CAD, HTN and pre-diabetes. Pt was seen today by Dr. Royann Shivers and got cardiac clearance.   Shower instructions given to pt.

## 2022-07-18 NOTE — Progress Notes (Signed)
Cardiology Office Note:    Date:  07/18/2022   ID:  Joshua DaneDavid R Flippen, DOB [birth year], MRN [id number]  PCP:  Marcine MatarJohnson, Deborah B, MD  Cardiologist:  Thurmon FairMihai Javen Hinderliter, MD   Referring MD: Marcine MatarJohnson, Deborah B, MD   Chief Complaint  Patient presents with   Cardiac Valve Problem    History of Present Illness:    Joshua [name] is a 62 y.o. male who presented with decompensated heart failure and severe cardiomyopathy (EF 25-30 %) in September 2018 and was identified as having severe 2 vessel coronary artery disease, with drug-eluting stents placed to the LAD and RCA. Following revascularization he has had remarkable improvement in LV systolic function which is now normal (EF 55-60%). He has a bicuspid aortic valve, moderate aortic stenosis (per echo 07/16/2022 mean gradient 42 mmHg, dimensionless index 0.2) and very mild aortic root enlargement (estimated to be 39 mm by echo 2023, but normal at 36 mm on the current echo).  He does not appear to have any clear symptoms from the aortic stenosis yet, other than he becomes "tired.  He is able to use a large chainsaw and carry the cart but without exertional angina, dyspnea or syncope.  Has a lot of pain in his left shoulder and is planning to have arthroscopic rotator cuff repair on April 8 with Dr. Steward DroneBokshan.  He had uncomplicated right shoulder surgery less than a year ago.  He has managed to keep the weight off and is no longer in obese range with a BMI of 29.  His most recent metabolic workup shows an excellent LDL cholesterol 52, but he still has features of the metabolic syndrome with HDL 34, triglycerides 167, hemoglobin A1c 6.1%.  He has normal renal function.  He is on Repatha due to history of elevated liver function test on statins, recurred on rechallenge with atorvastatin.  His blood pressure is well controlled and he does best when he takes the lisinopril in 2 divided doses.  He has not tolerated higher doses of carvedilol due to  bradycardia.  Past Medical History:  Diagnosis Date   Aortic stenosis    CAD (coronary artery disease), native coronary artery 12/26/2016   DES LAD & RCA, EF 25-30%   CHF (congestive heart failure)    Dyslipidemia    Hypertension    Obesity    Osteoarthritis    Pre-diabetes    a. prior A1C 5.6, states he was on meds for this at one point.    Past Surgical History:  Procedure Laterality Date   CORONARY STENT INTERVENTION N/A 12/29/2016   Procedure: CORONARY STENT INTERVENTION;  Surgeon: Corky CraftsVaranasi, Jayadeep S, MD;  [facility];  Service: Cardiovascular;  Laterality: N/A;   RIGHT/LEFT HEART CATH AND CORONARY ANGIOGRAPHY N/A 12/29/2016   Procedure: RIGHT/LEFT HEART CATH AND CORONARY ANGIOGRAPHY;  Surgeon: Corky CraftsVaranasi, Jayadeep S, MD;  [facility];  Service: Cardiovascular;  Laterality: N/A;   SHOULDER ARTHROSCOPY WITH ROTATOR CUFF REPAIR AND OPEN BICEPS TENODESIS Right 11/28/2021   Procedure: RIGHT SHOULDER ARTHROSCOPY WITH ROTATOR CUFF REPAIR AND OPEN BICEPS TENODESIS;  Surgeon: Huel CoteBokshan, Steven, MD;  Location: [facility] OR;  Service: Orthopedics;  Laterality: Right;   TONSILLECTOMY     TOTAL HIP ARTHROPLASTY Right 04/26/2018   Procedure: RIGHT TOTAL HIP ARTHROPLASTY ANTERIOR APPROACH;  Surgeon: Tarry KosXu, Naiping M, MD;  Location: [facility] OR;  Service: Orthopedics;  Laterality: Right;   TOTAL HIP ARTHROPLASTY Left 11/15/2018   TOTAL HIP ARTHROPLASTY Left 11/15/2018   Procedure:  LEFT TOTAL HIP ARTHROPLASTY ANTERIOR APPROACH;  Surgeon: Tarry Kos, MD;  Location: [facility] OR;  Service: Orthopedics;  Laterality: Left;    Current Medications: Current Meds  Medication Sig   amLODipine (NORVASC) 5 MG tablet TAKE 1 TABLET (5 MG TOTAL) BY MOUTH DAILY.   aspirin EC 81 MG tablet Take 81 mg by mouth daily. Swallow whole.   carvedilol (COREG) 3.125 MG tablet Take 1 tablet (3.125 mg total) by mouth 2 (two) times daily.   Evolocumab (REPATHA SURECLICK) 140 MG/ML SOAJ Inject 1 mL into the skin  every 14 (fourteen) days.   lisinopril (ZESTRIL) 10 MG tablet TAKE 1 TABLET BY MOUTH TWICE A DAY   loratadine (CLARITIN) 10 MG tablet Take 10 mg by mouth daily.   methocarbamol (ROBAXIN) 500 MG tablet Take 1 tablet (500 mg total) by mouth 2 (two) times daily as needed for muscle spasms.   potassium chloride (KLOR-CON) 10 MEQ tablet TAKE 1 TABLET (10 MEQ TOTAL) BY MOUTH DAILY. DX: HYPOKALEMIA     Allergies:   Statins   Social History   Socioeconomic History   Marital status: Single    Spouse name: Not on file   Number of children: Not on file   Years of education: Not on file   Highest education level: Not on file  Occupational History   Not on file  Tobacco Use   Smoking status: Former   Smokeless tobacco: Never   Tobacco comments:    Smoked lightly for approx 20 years, quit 1990s  Vaping Use   Vaping Use: Never used  Substance and Sexual Activity   Alcohol use: Not Currently   Drug use: Yes    Types: Marijuana    Comment: every night (to sleep)   Sexual activity: Not on file  Other Topics Concern   Not on file  Social History Narrative   Lives Home alone. Sister makes all health related decisions. Declined Advanced directive at this time   Social Determinants of Health   Financial Resource Strain: Not on file  Food Insecurity: Not on file  Transportation Needs: Not on file  Physical Activity: Not on file  Stress: Not on file  Social Connections: Not on file     Family History: The patient's family history includes CAD in his father.  ROS:   Please see the history of present illness.    All other systems are reviewed and are negative.   EKGs/Labs/Other Studies Reviewed:    EKG:  EKG is ordered today.  Shows sinus rhythm with first-degree AV block (PR 210 ms), borderline left axis deviation, incomplete left bundle branch block (QRS 112 ms), no minor repolarization changes in leads I, aVL, QTc 452 ms.  Not significantly changed from the previous tracing.    ECHO 07/16/2022:    1. The aortic valve is known bicuspid. There is severe calcifcation of the aortic valve. There is severe thickening of the aortic valve. Aortic valve regurgitation is mild. Severe aortic valve stenosis. Aortic valve mean gradient measures 42.0 mmHg. Aortic valve Vmax measures 4.00 m/s, AVA 0.61cm2, DI 0.2.   2. Left ventricular ejection fraction, by estimation, is 55 to 60%. The left ventricle has normal function. The left ventricle has no regional wall motion abnormalities. There is mild concentric left ventricular hypertrophy. Left ventricular diastolic parameters are consistent with Grade I diastolic dysfunction (impaired relaxation). The average left ventricular global longitudinal strain is -13.4 %. The global longitudinal strain is abnormal.   3. Right ventricular systolic function  is normal. The right ventricular size is normal.   4. Left atrial size was mildly dilated.   5. The mitral valve is normal in structure. Trivial mitral valve regurgitation. No evidence of mitral stenosis.   6. The inferior vena cava is normal in size with greater than 50% respiratory variability, suggesting right atrial pressure of 3 mmHg.   Comparison(s): Previous echo 08/08/2021 aortic valve area by VTI measures 1.24cm. Aortic valve mean gradient measures 25.9 mmghg.   AV Area (VTI):     0.61 cm  AV Vmax:           400.00 cm/s  AV Peak Grad:      64.0 mmHg  LVOT/AV VTI ratio: 0.18  AI PHT:            351 msec   Recent Labs: 09/10/2021: ALT 58 11/28/2021: BUN 13; Creatinine, Ser 0.91; Hemoglobin 14.7; Platelets 145; Potassium 4.1; Sodium 139  Recent Lipid Panel    Component Value Date/Time   CHOL 114 09/10/2021 1132   TRIG 167 (H) 09/10/2021 1132   HDL 34 (L) 09/10/2021 1132   CHOLHDL 3.4 09/10/2021 1132   CHOLHDL 4.6 12/27/2016 0238   VLDL 18 12/27/2016 0238   LDLCALC 52 09/10/2021 1132    Physical Exam:    VS:  BP 120/74 (BP Location: Left Arm, Patient Position: Sitting,  Cuff Size: Large)   Pulse 74   Ht 6\' 1"  (1.854 m)   Wt 224 lb 14.4 oz (102 kg)   SpO2 96%   BMI 29.67 kg/m     Wt Readings from Last 3 Encounters:  07/18/22 224 lb 14.4 oz (102 kg)  01/09/22 212 lb 9.6 oz (96.4 kg)  11/28/21 212 lb (96.2 kg)      General: Alert, oriented x3, no distress, overweight Head: no evidence of trauma, PERRL, EOMI, no exophtalmos or lid lag, no myxedema, no xanthelasma; normal ears, nose and oropharynx Neck: normal jugular venous pulsations and no hepatojugular reflux; full carotid pulses with mild delay and bilateral carotid bruits radiating from chest Chest: clear to auscultation, no signs of consolidation by percussion or palpation, normal fremitus, symmetrical and full respiratory excursions Cardiovascular: normal position and quality of the apical impulse, regular rhythm, normal first and virtually absent second heart sounds, grade 3/6 mid-to-late peaking aortic ejection murmur, sounds harsher, less musical than last year; no diastolic murmurs, rubs or gallops Abdomen: no tenderness or distention, no masses by palpation, no abnormal pulsatility or arterial bruits, normal bowel sounds, no hepatosplenomegaly Extremities: no clubbing, cyanosis or edema; 2+ radial, ulnar and brachial pulses bilaterally; 2+ right femoral, posterior tibial and dorsalis pedis pulses; 2+ left femoral, posterior tibial and dorsalis pedis pulses; no subclavian or femoral bruits Neurological: grossly nonfocal Psych: Normal mood and affect     ASSESSMENT:    1. Chronic diastolic heart failure   2. Coronary artery disease involving native coronary artery of native heart without angina pectoris   3. Aortic valve stenosis, nonrheumatic   4. Essential hypertension   5. Dyslipidemia (high LDL; low HDL)   6. Prediabetes   7. Mild dilation of ascending aorta   8. Preoperative cardiovascular examination      PLAN:    In order of problems listed above:  CHF: Currently  asymptomatic, NYHA functional class I, euvolemic without diuretics. Presented with severely reduced LVEF of 25-30% due to ischemic cardiomyopathy, dyspnea rather than angina.  Systolic dysfunction resolved completely after revascularization.  NYHA functional class I CAD: Asymptomatic despite being physically  active.  He has never had angina pectoris.  Two-vessel disease treated with percutaneous revascularization in 2018.  No clinical evidence of recurrent disease since then. AS: Reviewed his most recent echo.  Bicuspid aortic valve which has now progressed to severe aortic stenosis with a peak aortic valve velocity of 4 m/s and a mean gradient of 42 mmHg.  Highly likely to develop symptoms and require aortic valve replacement in the next 6-12 months.  There is at most mild dilation of the ascending aorta.  He is almost 62 years old and is definitely a candidate for a tissue prosthesis, discussed options for TAVR versus surgical aortic valve replacement.  He will require right and left heart catheterization and coronary angiography and CT angiography of the entire aorta before we decide on the most appropriate approach.   He should take care of any residual dental problems in the next couple of months so we do not have to do with those issues when it comes time for aortic valve replacement. Bilateral carotid bruits: Bruits radiating from his aortic valve, no evidence for obstructive lesions on duplex ultrasound September 2019. HTN: Well-controlled.  Does not tolerate higher beta-blocker dose.  Lisinopril works better for him when he takes it twice daily. HLP: Repeated challenges with statin have led to liver test abnormalities.  Excellent response to Repatha.  LDL well within target of less than 70. Metabolic syndrome: Low HDL, mildly elevated triglycerides and borderline elevated hemoglobin A1c all suggest increased risk of progression of vascular disorders and development of full-blown diabetes.  Needs to  try to lose a little more weight and remain physically active. 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  This has not worsened on echo (maximum aortic root diameter 3.9 cm on echo in 2023, but measured at 3.6 cm on echo this year).  We will reevaluate w CT.   Preop CV eval: Scheduled for left shoulder arthroscopy with rotator cuff repair, which is a very low risk procedure for cardiovascular complications.  This is scheduled for next Monday.  I think he can go ahead with it since he is asymptomatic from the cardiovascular point of view.    Medication Adjustments/Labs and Tests Ordered: Current medicines are reviewed at length with the patient today.  Concerns regarding medicines are outlined above.  Orders Placed This Encounter  Procedures   Ambulatory referral to [facility] (only at [facility])   EKG 12-Lead   No orders of the defined types were placed in this encounter.   Signed, Thurmon FairMihai Jamaul Heist, MD  07/18/2022 9:20 AM    [facility]

## 2022-07-18 NOTE — Progress Notes (Signed)
Anesthesia Chart Review:  Pt is a same day work up   Case: [id number] Date/Time: 07/21/22 1305   Procedure: LEFT SHOULDER ARTHROSCOPY WITH ROTATOR CUFF REPAIR AND BICEPS TENODESIS (Left)   Anesthesia type: Regional   Pre-op diagnosis: LEFT ROTATOR CUFF TEAR   [facility] ROOM 07 / [facility]   Surgeons: Huel CoteBokshan, Steven, MD       DISCUSSION: Pt is 62 years old with hx CAD (s/p DES to LAD and RCA 2018), CHF, ischemic cardiomyopathy (EF now recovered), bicuspid aortic valve, aortic stenosis (now severe but pt still asymptomatic), dilated ascending aorta, HTN, pre-diabetes.   Per cardiology notes, bilateral carotid bruits are radiating from his aortic valve, no evidence for obstructive lesions on duplex ultrasound September 2019 (done to eval bruits)  VS: Ht 6\' 1"  (1.854 m)   Wt 97.1 kg   BMI 28.23 kg/m   PROVIDERS: - PCP is Marcine MatarJohnson, Deborah B, MD - Cardiologist is Thurmon FairMihai Croitoru, MD. Jovita GammaGave ok to proceed with surgery at last office visit 07/18/22 since pt is asymptomatic from CV point of view  LABS: Will be obtained day of surgery    EKG 07/18/22: sinus rhythm with first-degree AV block (PR 210 ms), borderline left axis deviation, incomplete left bundle branch block (QRS 112 ms), no minor repolarization changes in leads I, aVL, QTc 452 ms. Not significantly changed from the previous tracing.    CV: ECHO 07/16/2022: 1. The aortic valve is known bicuspid. There is severe calcifcation of the aortic valve. There is severe thickening of the aortic valve. Aortic valve regurgitation is mild. Severe aortic valve stenosis. Aortic valve mean gradient measures 42.0 mmHg. Aortic valve Vmax measures 4.00 m/s, AVA 0.61cm2, DI 0.2.  2. Left ventricular ejection fraction, by estimation, is 55 to 60%. The left ventricle has normal function. The left ventricle has no regional wall motion abnormalities. There is mild concentric left ventricular hypertrophy. Left ventricular diastolic parameters are consistent  with Grade I diastolic dysfunction (impaired relaxation). The average left ventricular global longitudinal strain is -13.4 %. The global longitudinal strain is abnormal.  3. Right ventricular systolic function is normal. The right ventricular size is normal.  4. Left atrial size was mildly dilated.  5. The mitral valve is normal in structure. Trivial mitral valve regurgitation. No evidence of mitral stenosis.  6. The inferior vena cava is normal in size with greater than 50% respiratory variability, suggesting right atrial pressure of 3 mmHg.  - Comparison(s): Previous echo 08/08/2021 aortic valve area by VTI measures 1.24cm. Aortic valve mean gradient measures 25.9 mmghg.    AV Area (VTI):     0.61 cm  AV Vmax:           400.00 cm/s  AV Peak Grad:      64.0 mmHg  LVOT/AV VTI ratio: 0.18  AI PHT:            351 msec    Carotid US 01/05/18:  - Right Carotid: Velocities in the right ICA are consistent with a 1-39%  stenosis. Non-hemodynamically significant plaque <50% noted in the  CCA.  - Left Carotid: Velocities in the left ICA are consistent with a 1-39%  stenosis. Non-hemodynamically significant plaque noted in the CCA.  - Vertebrals:  Bilateral vertebral arteries demonstrate antegrade flow.  - Subclavians: Normal flow hemodynamics were seen in bilateral subclavian arteries.    R/L cardiac cath 12/29/16:  Prox RCA lesion, 25 %stenosed. Dist RCA lesion, 25 %stenosed. Dist Cx lesion, 60 %stenosed. Prox Cx  to Mid Cx lesion, 25 %stenosed. There is moderate to severe left ventricular systolic dysfunction. The left ventricular ejection fraction is 25-35% by visual estimate. LV end diastolic pressure is moderately elevated. There is no aortic valve stenosis. Mid RCA lesion, 75 %stenosed. A STENT RESOLUTE ONYX [id number] drug eluting stent was successfully placed. Post intervention, there is a 0% residual stenosis. Mid LAD lesion, 75 %stenosed. A STENT RESOLUTE ONYX [id number] drug eluting stent  was successfully placed, post dilated to > 3 mm. Post intervention, there is a 0% residual stenosis. Hemodynamic findings consistent with mild pulmonary hypertension. Ao sat 90%, PA sat 62%; CO 4.4 L/min, CI 2.1; mean PA pressure 34 mm Hg   Past Medical History:  Diagnosis Date   Aortic stenosis    CAD (coronary artery disease), native coronary artery 12/26/2016   DES LAD & RCA, EF 25-30%   CHF (congestive heart failure)    Dyslipidemia    Hypertension    Obesity    Osteoarthritis    Pre-diabetes    a. prior A1C 5.6, states he was on meds for this at one point.    Past Surgical History:  Procedure Laterality Date   CORONARY STENT INTERVENTION N/A 12/29/2016   Procedure: CORONARY STENT INTERVENTION;  Surgeon: Corky Crafts, MD;  Location: [facility] INVASIVE CV LAB;  Service: Cardiovascular;  Laterality: N/A;   RIGHT/LEFT HEART CATH AND CORONARY ANGIOGRAPHY N/A 12/29/2016   Procedure: RIGHT/LEFT HEART CATH AND CORONARY ANGIOGRAPHY;  Surgeon: Corky Crafts, MD;  Location: [facility] INVASIVE CV LAB;  Service: Cardiovascular;  Laterality: N/A;   SHOULDER ARTHROSCOPY WITH ROTATOR CUFF REPAIR AND OPEN BICEPS TENODESIS Right 11/28/2021   Procedure: RIGHT SHOULDER ARTHROSCOPY WITH ROTATOR CUFF REPAIR AND OPEN BICEPS TENODESIS;  Surgeon: Huel Cote, MD;  Location: [facility] OR;  Service: Orthopedics;  Laterality: Right;   TONSILLECTOMY     TOTAL HIP ARTHROPLASTY Right 04/26/2018   Procedure: RIGHT TOTAL HIP ARTHROPLASTY ANTERIOR APPROACH;  Surgeon: Tarry Kos, MD;  Location: [facility] OR;  Service: Orthopedics;  Laterality: Right;   TOTAL HIP ARTHROPLASTY Left 11/15/2018   TOTAL HIP ARTHROPLASTY Left 11/15/2018   Procedure: LEFT TOTAL HIP ARTHROPLASTY ANTERIOR APPROACH;  Surgeon: Tarry Kos, MD;  Location: [facility] OR;  Service: Orthopedics;  Laterality: Left;    MEDICATIONS: No current facility-administered medications for this encounter.    aspirin EC 81 MG tablet   lisinopril (ZESTRIL) 10 MG tablet    potassium chloride (KLOR-CON) 10 MEQ tablet   amLODipine (NORVASC) 5 MG tablet   carvedilol (COREG) 3.125 MG tablet   Evolocumab (REPATHA SURECLICK) 140 MG/ML SOAJ   fluticasone (FLONASE) 50 MCG/ACT nasal spray   ibuprofen (ADVIL) 800 MG tablet   loratadine (CLARITIN) 10 MG tablet   methocarbamol (ROBAXIN) 500 MG tablet   oxyCODONE (ROXICODONE) 5 MG immediate release tablet    If labs acceptable day of surgery, I anticipate pt can proceed with surgery as scheduled.  Rica Mast, PhD, FNP-BC [facility]/Anesthesiology Phone: [phone] 07/18/2022 10:10 AM

## 2022-07-18 NOTE — Anesthesia Preprocedure Evaluation (Signed)
Anesthesia Evaluation  Patient identified by MRN, date of birth, ID band Patient awake    Reviewed: Allergy & Precautions, H&P , NPO status , Patient's Chart, lab work & pertinent test results  Airway Mallampati: II   Neck ROM: full    Dental   Pulmonary former smoker   breath sounds clear to auscultation       Cardiovascular hypertension, + CAD and +CHF  + Valvular Problems/Murmurs AS  Rhythm:regular Rate:Normal  Bicuspid aortic valve with severe AS.  AVA 0.61 cm2, mean PG 42 mmHg. Pt is asymptomatic and cleared for surgery by cardiologist.  EF 55-60%   Neuro/Psych    GI/Hepatic   Endo/Other    Renal/GU      Musculoskeletal   Abdominal   Peds  Hematology   Anesthesia Other Findings   Reproductive/Obstetrics                             Anesthesia Physical Anesthesia Plan  ASA: 3  Anesthesia Plan: General   Post-op Pain Management: Regional block*   Induction: Intravenous  PONV Risk Score and Plan: 2 and Ondansetron, Dexamethasone, Midazolam and Treatment may vary due to age or medical condition  Airway Management Planned: Oral ETT  Additional Equipment: Arterial line  Intra-op Plan:   Post-operative Plan: Extubation in OR  Informed Consent: I have reviewed the patients History and Physical, chart, labs and discussed the procedure including the risks, benefits and alternatives for the proposed anesthesia with the patient or authorized representative who has indicated his/her understanding and acceptance.     Dental advisory given  Plan Discussed with: CRNA, Anesthesiologist and Surgeon  Anesthesia Plan Comments: (See APP note by Joslyn Hy, FNP )       Anesthesia Quick Evaluation

## 2022-07-21 ENCOUNTER — Other Ambulatory Visit (HOSPITAL_BASED_OUTPATIENT_CLINIC_OR_DEPARTMENT_OTHER): Payer: Self-pay | Admitting: Orthopaedic Surgery

## 2022-07-21 ENCOUNTER — Ambulatory Visit (HOSPITAL_BASED_OUTPATIENT_CLINIC_OR_DEPARTMENT_OTHER): Payer: Self-pay | Admitting: Orthopaedic Surgery

## 2022-07-21 ENCOUNTER — Ambulatory Visit (HOSPITAL_COMMUNITY): Payer: Medicaid Other | Admitting: Anesthesiology

## 2022-07-21 ENCOUNTER — Ambulatory Visit (HOSPITAL_COMMUNITY)
Admission: RE | Admit: 2022-07-21 | Discharge: 2022-07-21 | Disposition: A | Discharge status: Home / Self Care | Payer: Medicaid Other | Attending: Orthopaedic Surgery | Admitting: Orthopaedic Surgery

## 2022-07-21 ENCOUNTER — Encounter (HOSPITAL_COMMUNITY): Payer: Self-pay | Admitting: Orthopaedic Surgery

## 2022-07-21 ENCOUNTER — Other Ambulatory Visit: Payer: Self-pay

## 2022-07-21 ENCOUNTER — Encounter (HOSPITAL_COMMUNITY)
Admission: RE | Disposition: A | Discharge status: Home / Self Care | Payer: Self-pay | Source: Home / Self Care | Attending: Orthopaedic Surgery

## 2022-07-21 DIAGNOSIS — M7522 Bicipital tendinitis, left shoulder: Secondary | ICD-10-CM | POA: Insufficient documentation

## 2022-07-21 DIAGNOSIS — Z01818 Encounter for other preprocedural examination: Secondary | ICD-10-CM

## 2022-07-21 DIAGNOSIS — Z09 Encounter for follow-up examination after completed treatment for conditions other than malignant neoplasm: Secondary | ICD-10-CM | POA: Insufficient documentation

## 2022-07-21 DIAGNOSIS — M25812 Other specified joint disorders, left shoulder: Secondary | ICD-10-CM | POA: Diagnosis not present

## 2022-07-21 DIAGNOSIS — I110 Hypertensive heart disease with heart failure: Secondary | ICD-10-CM | POA: Insufficient documentation

## 2022-07-21 DIAGNOSIS — M75102 Unspecified rotator cuff tear or rupture of left shoulder, not specified as traumatic: Secondary | ICD-10-CM | POA: Diagnosis not present

## 2022-07-21 DIAGNOSIS — I509 Heart failure, unspecified: Secondary | ICD-10-CM | POA: Insufficient documentation

## 2022-07-21 DIAGNOSIS — M7542 Impingement syndrome of left shoulder: Secondary | ICD-10-CM

## 2022-07-21 DIAGNOSIS — M75122 Complete rotator cuff tear or rupture of left shoulder, not specified as traumatic: Secondary | ICD-10-CM

## 2022-07-21 DIAGNOSIS — M75101 Unspecified rotator cuff tear or rupture of right shoulder, not specified as traumatic: Secondary | ICD-10-CM

## 2022-07-21 DIAGNOSIS — I350 Nonrheumatic aortic (valve) stenosis: Secondary | ICD-10-CM | POA: Diagnosis not present

## 2022-07-21 DIAGNOSIS — Z87891 Personal history of nicotine dependence: Secondary | ICD-10-CM | POA: Insufficient documentation

## 2022-07-21 DIAGNOSIS — G8918 Other acute postprocedural pain: Secondary | ICD-10-CM | POA: Diagnosis not present

## 2022-07-21 DIAGNOSIS — I2510 Atherosclerotic heart disease of native coronary artery without angina pectoris: Secondary | ICD-10-CM | POA: Diagnosis not present

## 2022-07-21 HISTORY — PX: SHOULDER ARTHROSCOPY WITH ROTATOR CUFF REPAIR AND OPEN BICEPS TENODESIS: SHX6677

## 2022-07-21 LAB — BASIC METABOLIC PANEL
Anion gap: 8 (ref 5–15)
BUN: 19 mg/dL (ref 8–23)
CO2: 24 mmol/L (ref 22–32)
Calcium: 8.5 mg/dL — ABNORMAL LOW (ref 8.9–10.3)
Chloride: 105 mmol/L (ref 98–111)
Creatinine, Ser: 0.79 mg/dL (ref 0.61–1.24)
GFR, Estimated: >60 mL/min (ref >60)
Glucose, Bld: 105 mg/dL — ABNORMAL HIGH (ref 70–99)
Potassium: 3.9 mmol/L (ref 3.5–5.1)
Sodium: 137 mmol/L (ref 135–145)

## 2022-07-21 LAB — CBC
HCT: 42.6 % (ref 39.0–52.0)
Hemoglobin: 14.8 g/dL (ref 13.0–17.0)
MCH: 31 pg (ref 26.0–34.0)
MCHC: 34.7 g/dL (ref 30.0–36.0)
MCV: 89.3 fL (ref 80.0–100.0)
Platelets: 158 10*3/uL (ref 150–400)
RBC: 4.77 MIL/uL (ref 4.22–5.81)
RDW: 13.2 % (ref 11.5–15.5)
WBC: 6.7 10*3/uL (ref 4.0–10.5)
nRBC: 0 % (ref 0.0–0.2)

## 2022-07-21 SURGERY — SHOULDER ARTHROSCOPY WITH ROTATOR CUFF REPAIR AND OPEN BICEPS TENODESIS
Anesthesia: General | Laterality: Left

## 2022-07-21 MED ORDER — FENTANYL CITRATE (PF) 250 MCG/5ML IJ SOLN
INTRAMUSCULAR | Status: AC
  Filled 2022-07-21: qty 5

## 2022-07-21 MED ORDER — MIDAZOLAM HCL 2 MG/2ML IJ SOLN
INTRAMUSCULAR | Status: AC
  Filled 2022-07-21: qty 2

## 2022-07-21 MED ORDER — FENTANYL CITRATE (PF) 100 MCG/2ML IJ SOLN
INTRAMUSCULAR | Status: AC
  Administered 2022-07-21: 100 ug via INTRAVENOUS
  Filled 2022-07-21: qty 2

## 2022-07-21 MED ORDER — GABAPENTIN 300 MG PO CAPS
300.0000 mg | ORAL_CAPSULE | Freq: Once | ORAL | Status: AC
  Administered 2022-07-21: 300 mg via ORAL
  Filled 2022-07-21: qty 1

## 2022-07-21 MED ORDER — FENTANYL CITRATE (PF) 100 MCG/2ML IJ SOLN: 25.0000 ug | INTRAMUSCULAR | Status: DC | PRN

## 2022-07-21 MED ORDER — PROPOFOL 10 MG/ML IV BOLUS
INTRAVENOUS | Status: AC
  Filled 2022-07-21: qty 20

## 2022-07-21 MED ORDER — SUGAMMADEX SODIUM 200 MG/2ML IV SOLN
INTRAVENOUS | Status: DC | PRN
  Administered 2022-07-21: 200 mg via INTRAVENOUS

## 2022-07-21 MED ORDER — MIDAZOLAM HCL 2 MG/2ML IJ SOLN
INTRAMUSCULAR | Status: AC
  Administered 2022-07-21: 2 mg via INTRAVENOUS
  Filled 2022-07-21: qty 2

## 2022-07-21 MED ORDER — TRANEXAMIC ACID-NACL 1000-0.7 MG/100ML-% IV SOLN
1000.0000 mg | INTRAVENOUS | Status: AC
  Administered 2022-07-21: 1000 mg via INTRAVENOUS
  Filled 2022-07-21: qty 100

## 2022-07-21 MED ORDER — LACTATED RINGERS IV SOLN: INTRAVENOUS | Status: DC

## 2022-07-21 MED ORDER — EPINEPHRINE PF 1 MG/ML IJ SOLN
INTRAMUSCULAR | Status: DC | PRN
  Administered 2022-07-21 (×2): 1 mg

## 2022-07-21 MED ORDER — LIDOCAINE 2% (20 MG/ML) 5 ML SYRINGE
INTRAMUSCULAR | Status: DC | PRN
  Administered 2022-07-21: 60 mg via INTRAVENOUS

## 2022-07-21 MED ORDER — MIDAZOLAM HCL 2 MG/2ML IJ SOLN
INTRAMUSCULAR | Status: DC | PRN
  Administered 2022-07-21: 2 mg via INTRAVENOUS

## 2022-07-21 MED ORDER — ONDANSETRON HCL 4 MG/2ML IJ SOLN
INTRAMUSCULAR | Status: DC | PRN
  Administered 2022-07-21: 4 mg via INTRAVENOUS

## 2022-07-21 MED ORDER — SODIUM CHLORIDE 0.9 % IR SOLN
Status: DC | PRN
  Administered 2022-07-21 (×2): 3000 mL

## 2022-07-21 MED ORDER — IBUPROFEN 800 MG PO TABS: 800.0000 mg | ORAL_TABLET | Freq: Three times a day (TID) | ORAL | 0 refills | Status: AC

## 2022-07-21 MED ORDER — OXYCODONE HCL 5 MG/5ML PO SOLN: 5.0000 mg | Freq: Once | ORAL | Status: DC | PRN

## 2022-07-21 MED ORDER — EPINEPHRINE PF 1 MG/ML IJ SOLN
INTRAMUSCULAR | Status: AC
  Filled 2022-07-21: qty 2

## 2022-07-21 MED ORDER — ASPIRIN 325 MG PO TBEC: 325.0000 mg | DELAYED_RELEASE_TABLET | Freq: Every day | ORAL | 0 refills | Status: DC

## 2022-07-21 MED ORDER — FENTANYL CITRATE (PF) 100 MCG/2ML IJ SOLN: 100.0000 ug | Freq: Once | INTRAMUSCULAR | Status: AC

## 2022-07-21 MED ORDER — OXYCODONE HCL 5 MG PO TABS: 5.0000 mg | ORAL_TABLET | ORAL | 0 refills | Status: DC | PRN

## 2022-07-21 MED ORDER — CHLORHEXIDINE GLUCONATE 0.12 % MT SOLN
15.0000 mL | Freq: Once | OROMUCOSAL | Status: AC
  Administered 2022-07-21: 15 mL via OROMUCOSAL
  Filled 2022-07-21: qty 15

## 2022-07-21 MED ORDER — BUPIVACAINE HCL (PF) 0.5 % IJ SOLN
INTRAMUSCULAR | Status: DC | PRN
  Administered 2022-07-21: 15 mL via PERINEURAL

## 2022-07-21 MED ORDER — ROCURONIUM BROMIDE 10 MG/ML (PF) SYRINGE
PREFILLED_SYRINGE | INTRAVENOUS | Status: DC | PRN
  Administered 2022-07-21: 70 mg via INTRAVENOUS

## 2022-07-21 MED ORDER — ORAL CARE MOUTH RINSE: 15.0000 mL | Freq: Once | OROMUCOSAL | Status: AC

## 2022-07-21 MED ORDER — OXYCODONE HCL 5 MG PO TABS: 5.0000 mg | ORAL_TABLET | Freq: Once | ORAL | Status: DC | PRN

## 2022-07-21 MED ORDER — ACETAMINOPHEN 500 MG PO TABS
1000.0000 mg | ORAL_TABLET | Freq: Once | ORAL | Status: AC
  Administered 2022-07-21: 1000 mg via ORAL
  Filled 2022-07-21: qty 2

## 2022-07-21 MED ORDER — PHENYLEPHRINE HCL-NACL 20-0.9 MG/250ML-% IV SOLN
INTRAVENOUS | Status: DC | PRN
  Administered 2022-07-21: 50 ug/min via INTRAVENOUS

## 2022-07-21 MED ORDER — CEFAZOLIN SODIUM-DEXTROSE 2-4 GM/100ML-% IV SOLN
2.0000 g | INTRAVENOUS | Status: AC
  Administered 2022-07-21: 2 g via INTRAVENOUS
  Filled 2022-07-21: qty 100

## 2022-07-21 MED ORDER — FENTANYL CITRATE (PF) 250 MCG/5ML IJ SOLN
INTRAMUSCULAR | Status: DC | PRN
  Administered 2022-07-21: 100 ug via INTRAVENOUS

## 2022-07-21 MED ORDER — ACETAMINOPHEN 500 MG PO TABS: 500.0000 mg | ORAL_TABLET | Freq: Three times a day (TID) | ORAL | 0 refills | Status: AC

## 2022-07-21 MED ORDER — BUPIVACAINE LIPOSOME 1.3 % IJ SUSP
INTRAMUSCULAR | Status: DC | PRN
  Administered 2022-07-21: 10 mL via PERINEURAL

## 2022-07-21 MED ORDER — MIDAZOLAM HCL 2 MG/2ML IJ SOLN: 2.0000 mg | Freq: Once | INTRAMUSCULAR | Status: AC

## 2022-07-21 MED ORDER — DEXAMETHASONE SODIUM PHOSPHATE 10 MG/ML IJ SOLN
INTRAMUSCULAR | Status: DC | PRN
  Administered 2022-07-21: 5 mg via INTRAVENOUS

## 2022-07-21 MED ORDER — ONDANSETRON HCL 4 MG/2ML IJ SOLN: 4.0000 mg | Freq: Four times a day (QID) | INTRAMUSCULAR | Status: DC | PRN

## 2022-07-21 MED ORDER — PROPOFOL 10 MG/ML IV BOLUS
INTRAVENOUS | Status: DC | PRN
  Administered 2022-07-21: 150 mg via INTRAVENOUS
  Administered 2022-07-21: 50 mg via INTRAVENOUS

## 2022-07-21 SURGICAL SUPPLY — 62 items
AID PSTN UNV HD RSTRNT DISP (MISCELLANEOUS) ×1
ANCH SUT KNTLS STRL SHLDR SYS (Anchor) ×2 IMPLANT
ANCHOR SOFT DL #2 1.5 (Anchor) IMPLANT
ANCHOR SUT QUATTRO KNTLS 4.5 (Anchor) IMPLANT
APL PRP STRL LF DISP 70% ISPRP (MISCELLANEOUS) ×1
BAG COUNTER SPONGE SURGICOUNT (BAG) IMPLANT
BAG SPNG CNTER NS LX DISP (BAG)
BLADE EXCALIBUR 4.0X13 (MISCELLANEOUS) IMPLANT
BNDG ELASTIC 6X5.8 VLCR STR LF (GAUZE/BANDAGES/DRESSINGS) ×1 IMPLANT
CANNULA TWIST IN 8.25X7CM (CANNULA) IMPLANT
CHLORAPREP W/TINT 26 (MISCELLANEOUS) ×1 IMPLANT
COVER SURGICAL LIGHT HANDLE (MISCELLANEOUS) ×1 IMPLANT
DRAPE INCISE IOBAN 66X45 STRL (DRAPES) ×2 IMPLANT
DRAPE U-SHAPE 47X51 STRL (DRAPES) ×2 IMPLANT
DRSG TEGADERM 4X4.75 (GAUZE/BANDAGES/DRESSINGS) ×3 IMPLANT
DW OUTFLOW CASSETTE/TUBE SET (MISCELLANEOUS) ×1 IMPLANT
ELECT REM PT RETURN 9FT ADLT (ELECTROSURGICAL) ×1
ELECTRODE REM PT RTRN 9FT ADLT (ELECTROSURGICAL) ×1 IMPLANT
GAUZE PAD ABD 8X10 STRL (GAUZE/BANDAGES/DRESSINGS) IMPLANT
GAUZE SPONGE 4X4 12PLY STRL (GAUZE/BANDAGES/DRESSINGS) ×1 IMPLANT
GAUZE SPONGE 4X4 12PLY STRL LF (GAUZE/BANDAGES/DRESSINGS) ×1 IMPLANT
GLOVE BIO SURGEON STRL SZ 6 (GLOVE) ×3 IMPLANT
GLOVE BIOGEL PI IND STRL 6.5 (GLOVE) ×2 IMPLANT
GLOVE BIOGEL PI IND STRL 8 (GLOVE) ×2 IMPLANT
GLOVE INDICATOR 8.0 STRL GRN (GLOVE) ×1 IMPLANT
GOWN STRL REUS W/ TWL LRG LVL3 (GOWN DISPOSABLE) ×3 IMPLANT
GOWN STRL REUS W/TWL LRG LVL3 (GOWN DISPOSABLE) ×3
KIT BASIN OR (CUSTOM PROCEDURE TRAY) ×1 IMPLANT
KIT TURNOVER KIT B (KITS) ×1 IMPLANT
MANIFOLD NEPTUNE II (INSTRUMENTS) ×1 IMPLANT
NDL SCORPION MULTI FIRE (NEEDLE) IMPLANT
NDL SPNL 18GX3.5 QUINCKE PK (NEEDLE) ×1 IMPLANT
NDL SUT 6 .5 CRC .975X.05 MAYO (NEEDLE) IMPLANT
NDL SUT PASSER RTC (NEEDLE) IMPLANT
NEEDLE MAYO TAPER (NEEDLE)
NEEDLE SCORPION MULTI FIRE (NEEDLE) IMPLANT
NEEDLE SPNL 18GX3.5 QUINCKE PK (NEEDLE) ×1 IMPLANT
NEEDLE SUT PASSER RTC (NEEDLE) ×1 IMPLANT
NS IRRIG 1000ML POUR BTL (IV SOLUTION) ×1 IMPLANT
PACK SHOULDER (CUSTOM PROCEDURE TRAY) ×1 IMPLANT
PAD ARMBOARD 7.5X6 YLW CONV (MISCELLANEOUS) ×2 IMPLANT
PROBE APOLLO 90XL (SURGICAL WAND) ×1 IMPLANT
RESTRAINT HEAD UNIVERSAL NS (MISCELLANEOUS) ×1 IMPLANT
SLING ARM IMMOBILIZER LRG (SOFTGOODS) IMPLANT
SPONGE T-LAP 4X18 ~~LOC~~+RFID (SPONGE) ×2 IMPLANT
STRIP CLOSURE SKIN 1/2X4 (GAUZE/BANDAGES/DRESSINGS) ×1 IMPLANT
SUCTION FRAZIER HANDLE 10FR (MISCELLANEOUS) ×1
SUCTION TUBE FRAZIER 10FR DISP (MISCELLANEOUS) ×1 IMPLANT
SUT ETHILON 3 0 PS 1 (SUTURE) ×1 IMPLANT
SUT FIBERWIRE #2 38 T-5 BLUE (SUTURE)
SUT VIC AB 2-0 CT1 27 (SUTURE) ×1
SUT VIC AB 2-0 CT1 TAPERPNT 27 (SUTURE) ×1 IMPLANT
SUT VICRYL 0 UR6 27IN ABS (SUTURE) ×1 IMPLANT
SUT VICRYL 1 TIES 12X18 (SUTURE) ×1 IMPLANT
SUTURE FIBERWR #2 38 T-5 BLUE (SUTURE) IMPLANT
SUTURE TAPE 1.3 FIBERLOP 20 ST (SUTURE) IMPLANT
SUTURETAPE 1.3 FIBERLOOP 20 ST (SUTURE)
TAPE CLOTH SURG 6X10 WHT LF (GAUZE/BANDAGES/DRESSINGS) IMPLANT
TOWEL GREEN STERILE (TOWEL DISPOSABLE) ×1 IMPLANT
TOWEL GREEN STERILE FF (TOWEL DISPOSABLE) ×1 IMPLANT
TUBING ARTHROSCOPY IRRIG 16FT (MISCELLANEOUS) ×1 IMPLANT
WATER STERILE IRR 1000ML POUR (IV SOLUTION) ×1 IMPLANT

## 2022-07-21 NOTE — Brief Op Note (Signed)
   Brief Op Note  Date of Surgery: 07/21/2022  Preoperative Diagnosis: LEFT ROTATOR CUFF TEAR  Postoperative Diagnosis: same  Procedure: Procedure(s): LEFT SHOULDER ARTHROSCOPY WITH ROTATOR CUFF REPAIR AND BICEPS TENODESIS  Implants: Implant Name Type Inv. Item Serial No. Manufacturer Lot No. LRB No. Used Action  ANCHOR SOFT DL #2 1.5 - [id number] Anchor ANCHOR SOFT DL #2 1.5  [employer](ORTH,TRAU,BIO,SG) [id number] Left 2 Implanted  ANCHOR SUT QUATTRO KNTLS 4.5 - [id number] Anchor ANCHOR SUT QUATTRO KNTLS 4.5  [employer](ORTH,TRAU,BIO,SG) [id number] Left 2 Implanted    Surgeons: Surgeon(s): Huel Cote, MD  Anesthesia: Regional    Estimated Blood Loss: See anesthesia record  Complications: None  Condition to PACU: Stable  Benancio Deeds, MD 07/21/2022 3:19 PM

## 2022-07-21 NOTE — Anesthesia Procedure Notes (Signed)
Anesthesia Regional Block: Interscalene brachial plexus block   Pre-Anesthetic Checklist: , timeout performed,  Correct Patient, Correct Site, Correct Laterality,  Correct Procedure, Correct Position, site marked,  Risks and benefits discussed,  Surgical consent,  Pre-op evaluation,  At surgeon's request and post-op pain management  Laterality: Left  Prep: chloraprep       Needles:  Injection technique: Single-shot  Needle Type: Echogenic Stimulator Needle     Needle Length: 5cm  Needle Gauge: 22     Additional Needles:   Procedures:, nerve stimulator,,,,,     Nerve Stimulator or Paresthesia:  Response: biceps flexion, 0.45 mA  Additional Responses:   Narrative:  Start time: 07/21/2022 12:33 PM End time: 07/21/2022 12:41 PM Injection made incrementally with aspirations every 5 mL.  Performed by: Personally  Anesthesiologist: Achille Rich, MD  Additional Notes: Functioning IV was confirmed and monitors were applied.  A 13mm 22ga Arrow echogenic stimulator needle was used. Sterile prep and drape,hand hygiene and sterile gloves were used.  Negative aspiration and negative test dose prior to incremental administration of local anesthetic. The patient tolerated the procedure well.  Ultrasound guidance: relevent anatomy identified, needle position confirmed, local anesthetic spread visualized around nerve(s), vascular puncture avoided.  Image printed for medical record.

## 2022-07-21 NOTE — Discharge Instructions (Signed)
     Discharge Instructions    Attending Surgeon: Huel Cote, MD Office Phone Number: [phone]   Diagnosis and Procedures:    Surgeries Performed: Left shoulder rotator cuff repair, biceps tenotomy  Discharge Plan:    Diet: Resume usual diet. Begin with light or bland foods.  Drink plenty of fluids.  Activity:  Keep sling and dressing in place until your follow up visit in Physical Therapy You are advised to go home directly from the hospital or surgical center. Restrict your activities.  GENERAL INSTRUCTIONS: 1.  Keep your surgical site elevated above your heart for at least 5-7 days or longer to prevent swelling. This will improve your comfort and your overall recovery following surgery.     2. Please call Dr. Serena Croissant office at [phone] with questions Monday-Friday during business hours. If no one answers, please leave a message and someone should get back to the patient within 24 hours. For emergencies please call 911 or proceed to the emergency room.   3. Patient to notify surgical team if experiences any of the following: Bowel/Bladder dysfunction, uncontrolled pain, nerve/muscle weakness, incision with increased drainage or redness, nausea/vomiting and Fever greater than 101.0 F.  Be alert for signs of infection including redness, streaking, odor, fever or chills. Be alert for excessive pain or bleeding and notify your surgeon immediately.  WOUND INSTRUCTIONS:   Leave your dressing/cast/splint in place until your post operative visit.  Keep it clean and dry.  Always keep the incision clean and dry until the staples/sutures are removed. If there is no drainage from the incision you should keep it open to air. If there is drainage from the incision you must keep it covered at all times until the drainage stops  Do not soak in a bath tub, hot tub, pool, lake or other body of water until 21 days after your surgery and your incision is completely dry and healed.   If you have removable sutures (or staples) they must be removed 10-14 days (unless otherwise instructed) from the day of your surgery.     1)  Elevate the extremity as much as possible.  2)  Keep the dressing clean and dry.  3)  Please call us if the dressing becomes wet or dirty.  4)  If you are experiencing worsening pain or worsening swelling, please call.     MEDICATIONS: Resume all previous home medications at the previous prescribed dose and frequency unless otherwise noted Start taking the  pain medications on an as-needed basis as prescribed  Please taper down pain medication over the next week following surgery.  Ideally you should not require a refill of any narcotic pain medication.  Take pain medication with food to minimize nausea. In addition to the prescribed pain medication, you may take over-the-counter pain relievers such as Tylenol.  Do NOT take additional tylenol if your pain medication already has tylenol in it.  Aspirin 325mg  daily for four weeks.      FOLLOWUP INSTRUCTIONS: 1. Follow up at the Physical Therapy Clinic 3-4 days following surgery. This appointment should be scheduled unless other arrangements have been made.The Physical Therapy scheduling number is [phone] if an appointment has not already been arranged.  2. Contact Dr. Serena Croissant office during office hours at [phone] or the practice after hours line at [phone] for non-emergencies. For medical emergencies call 911.   Discharge Location: Home

## 2022-07-21 NOTE — Anesthesia Procedure Notes (Signed)
Procedure Name: Intubation Date/Time: 07/21/2022 1:35 PM  Performed by: Wendi Snipes, CRNAPre-anesthesia Checklist: Patient identified, Emergency Drugs available, Suction available and Patient being monitored Patient Re-evaluated:Patient Re-evaluated prior to induction Oxygen Delivery Method: Circle System Utilized Preoxygenation: Pre-oxygenation with 100% oxygen Induction Type: IV induction Ventilation: Mask ventilation without difficulty Laryngoscope Size: 4 and Mac Grade View: Grade I Tube type: Oral Tube size: 7.5 mm Number of attempts: 1 Airway Equipment and Method: Stylet and Oral airway Placement Confirmation: ETT inserted through vocal cords under direct vision, positive ETCO2 and breath sounds checked- equal and bilateral Secured at: 23 cm Tube secured with: Tape Dental Injury: Teeth and Oropharynx as per pre-operative assessment

## 2022-07-21 NOTE — Transfer of Care (Signed)
Immediate Anesthesia Transfer of Care Note  Patient: Joshua [name]  Procedure(s) Performed: LEFT SHOULDER ARTHROSCOPY WITH ROTATOR CUFF REPAIR AND BICEPS TENODESIS (Left)  Patient Location: PACU  Anesthesia Type:General and Regional  Level of Consciousness: awake and patient cooperative  Airway & Oxygen Therapy: Patient Spontanous Breathing and Patient connected to face mask oxygen  Post-op Assessment: Report given to RN, Post -op Vital signs reviewed and stable, and Patient moving all extremities  Post vital signs: Reviewed and stable  Last Vitals:  Vitals Value Taken Time  BP 111/68 07/21/22 1541  Temp    Pulse 61 07/21/22 1544  Resp 20 07/21/22 1544  SpO2 96 % 07/21/22 1544  Vitals shown include unvalidated device data.  Last Pain:  Vitals:   07/21/22 1128  TempSrc:   PainSc: 2       Patients Stated Pain Goal: 3 (07/21/22 1128)  Complications: No notable events documented.

## 2022-07-21 NOTE — Op Note (Signed)
Date of Surgery: 07/21/2022  INDICATIONS: Joshua [name] is a 62 y.o.-year-old male with left shoulder rotator cuff repair and biceps tendinitis.  The risk and benefits of the procedure were discussed in detail and documented in the pre-operative evaluation.   PREOPERATIVE DIAGNOSES: Left shoulder, chronic rotator cuff tear, biceps tendinitis, and subacromial impingement.  POSTOPERATIVE DIAGNOSIS: Same.  PROCEDURE: Arthroscopic limited debridement - 42876 Arthroscopic subacromial decompression - 81157 Arthroscopic rotator cuff repair - 26203 Biceps tenotomy left biceps  SURGEON: Benancio Deeds MD  ASSISTANT: Kerby Less, ATC  ANESTHESIA:  general  IV FLUIDS AND URINE: See anesthesia record.  ANTIBIOTICS: Ancef  ESTIMATED BLOOD LOSS: 10 mL.  IMPLANTS:  Implant Name Type Inv. Item Serial No. Manufacturer Lot No. LRB No. Used Action  ANCHOR SOFT DL #2 1.5 - [id number] Anchor ANCHOR SOFT DL #2 1.5  [employer](ORTH,TRAU,BIO,SG) [id number] Left 2 Implanted  ANCHOR SUT QUATTRO KNTLS 4.5 - [id number] Anchor ANCHOR SUT QUATTRO KNTLS 4.5  [employer](ORTH,TRAU,BIO,SG) [id number] Left 2 Implanted    DRAINS: None  CULTURES: None  COMPLICATIONS: none  PROCEDURE:    OPERATIVE FINDING: Exam under anesthesia:   Examination under anesthesia revealed forward elevation of 150 degrees.  With the arm at the side, there was 65 degrees of external rotation.  There is a 1+ anterior load shift and a 1+ posterior load shift.    Arthroscopic findings demonstrated: Articular space: Significant hemarthrosis and hemorrhagic tissues Chondral surfaces: Normal Biceps: Significant flattening and tearing with multiple intrasubstance tearing Subscapularis: Intact Supraspinatus: Thickness with retraction to chondral junction Infraspinatus: Intact    I identified the patient in the pre-operative holding area.  I marked the operative right shoulder with my initials. I reviewed the risks and  benefits of the proposed surgical intervention and the patient wished to proceed.  Anesthesia was then performed with regional block.  The patient was transferred to the operative suite and placed in the beach chair position with all bony prominences padded.     SCDs were placed on bilateral lower extremity. Appropriate antibiotics was administered within 1 hour before incision.  Anesthesia was induced.  The operative extremity was then prepped and draped in standard fashion. A time out was performed confirming the correct extremity, correct patient and correct procedure.   The arthroscope was introduced in the glenohumeral joint from a posterior portal.  An anterior portal was created.  The shoulder was examined and the above findings were noted.     With an arthroscopic shaver and a wand ablator, synovitis throughout the  shoulder was resected.  The arthroscopic shaver was used to excise torn portions of the labrum back to a stable margin. Specifically this was done for the anterior superior and posterior labrum.   The biceps at this time was tenotomized using a electrocautery as this was significantly flattened and nonrepairable to hold suture.   Through visualization from intra-articular, the footprint of the rotator cuff was debrided of soft tissues back to bleeding bone.  This was done with an arthroscopic shaver.     The rotator cuff was then approached through the subacromial space. Anterior, lateral, and posterior portals were used.  Bursectomy was performed with an arthroscopic shaver and [employer] wand.  The soft tissues on the undersurface of the acromion and clavicle were resected with the arthroscopic shaver and wand. There was good excursion that was noted of the tendon back to its footprint which would be amendable to an repair.   The rotator cuff was repaired with a  double row configuration with two medial row all suture suture anchors as noted above with sutures passed from anterior  to posterior in horizontal fashion with a self passing suture device.  The posterior 2 sutures were tied with knots in order to ultimately reapproximate the tendon to its anatomic footprint. A total of 6 limbs of suture were passed.  The sutures were placed into two lateral row anchors.  This provided excellent anatomic footprint approximation.   The shoulder was irrigated.  The arthroscopic instruments were removed.  Wounds were closed with 3-0 nylon sutures.  A sterile dressing was applied with xeroform, 4x8s, abdominal pad, and tape. An Flonnie Hailstone was placed and the upper extremity was placed in a shoulder immobilizer.  The patient tolerated the procedure well and was taken to the recovery room in stable condition.  All counts were correct in the case. The patient tolerated the procedure well and was taken to the recovery room in stable condition.    POSTOPERATIVE PLAN: He will be nonweightbearing in a sling at all seen by physical therapy at which point we will begin passive range of motion.  I will see him back in 2 weeks for suture removal.  He will be placed on aspirin for blood clot prevention  Benancio Deeds, MD 3:20 PM

## 2022-07-21 NOTE — Anesthesia Postprocedure Evaluation (Signed)
Anesthesia Post Note  Patient: Joshua [name]  Procedure(s) Performed: LEFT SHOULDER ARTHROSCOPY WITH ROTATOR CUFF REPAIR AND BICEPS TENODESIS (Left)     Patient location during evaluation: PACU Anesthesia Type: General Level of consciousness: sedated and patient cooperative Pain management: pain level controlled Vital Signs Assessment: post-procedure vital signs reviewed and stable Respiratory status: spontaneous breathing Cardiovascular status: stable Anesthetic complications: no   No notable events documented.  Last Vitals:  Vitals:   07/21/22 1600 07/21/22 1615  BP: 114/72 113/67  Pulse: 62 64  Resp: (!) 22 17  Temp:  36.4 C  SpO2: 97% 96%    Last Pain:  Vitals:   07/21/22 1615  TempSrc:   PainSc: 0-No pain                 Lewie Loron

## 2022-07-21 NOTE — Interval H&P Note (Signed)
History and Physical Interval Note:  07/21/2022 1:04 PM  Joshua [name]  has presented today for surgery, with the diagnosis of LEFT ROTATOR CUFF TEAR.  The various methods of treatment have been discussed with the patient and family. After consideration of risks, benefits and other options for treatment, the patient has consented to  Procedure(s): LEFT SHOULDER ARTHROSCOPY WITH ROTATOR CUFF REPAIR AND BICEPS TENODESIS (Left) as a surgical intervention.  The patient's history has been reviewed, patient examined, no change in status, stable for surgery.  I have reviewed the patient's chart and labs.  Questions were answered to the patient's satisfaction.     Huel Cote

## 2022-07-22 ENCOUNTER — Encounter (HOSPITAL_COMMUNITY): Payer: Self-pay | Admitting: Orthopaedic Surgery

## 2022-07-23 ENCOUNTER — Ambulatory Visit (HOSPITAL_COMMUNITY): Payer: Medicaid Other

## 2022-07-24 ENCOUNTER — Other Ambulatory Visit (HOSPITAL_BASED_OUTPATIENT_CLINIC_OR_DEPARTMENT_OTHER): Payer: Self-pay | Admitting: Orthopaedic Surgery

## 2022-07-24 ENCOUNTER — Ambulatory Visit (HOSPITAL_BASED_OUTPATIENT_CLINIC_OR_DEPARTMENT_OTHER): Payer: Medicaid Other | Attending: Orthopaedic Surgery | Admitting: Physical Therapy

## 2022-07-24 ENCOUNTER — Encounter (HOSPITAL_BASED_OUTPATIENT_CLINIC_OR_DEPARTMENT_OTHER): Payer: Self-pay | Admitting: Physical Therapy

## 2022-07-24 ENCOUNTER — Other Ambulatory Visit: Payer: Self-pay

## 2022-07-24 DIAGNOSIS — M25612 Stiffness of left shoulder, not elsewhere classified: Secondary | ICD-10-CM | POA: Diagnosis present

## 2022-07-24 DIAGNOSIS — M25512 Pain in left shoulder: Secondary | ICD-10-CM | POA: Diagnosis present

## 2022-07-24 DIAGNOSIS — M6281 Muscle weakness (generalized): Secondary | ICD-10-CM | POA: Insufficient documentation

## 2022-07-24 DIAGNOSIS — M75101 Unspecified rotator cuff tear or rupture of right shoulder, not specified as traumatic: Secondary | ICD-10-CM | POA: Diagnosis present

## 2022-07-24 DIAGNOSIS — M75122 Complete rotator cuff tear or rupture of left shoulder, not specified as traumatic: Secondary | ICD-10-CM

## 2022-07-24 NOTE — Therapy (Signed)
OUTPATIENT PHYSICAL THERAPY SHOULDER EVALUATION   Patient Name: Joshua [name] MRN: [id number] DOB:04/14/1961, 62 y.o., male Today's Date: 07/24/2022  END OF SESSION:  PT End of Session - 07/24/22 1554     Visit Number 1    Number of Visits 14    Date for PT Re-Evaluation 10/24/22    Authorization Type [employer] MCD    PT Start Time 1553    PT Stop Time 1625    PT Time Calculation (min) 32 min    Activity Tolerance Patient tolerated treatment well    Behavior During Therapy WFL for tasks assessed/performed             Past Medical History:  Diagnosis Date   Aortic stenosis    CAD (coronary artery disease), native coronary artery 12/26/2016   DES LAD & RCA, EF 25-30%   CHF (congestive heart failure)    Dyslipidemia    Hypertension    Obesity    Osteoarthritis    Pre-diabetes    a. prior A1C 5.6, states he was on meds for this at one point.   Past Surgical History:  Procedure Laterality Date   CORONARY STENT INTERVENTION N/A 12/29/2016   Procedure: CORONARY STENT INTERVENTION;  Surgeon: Corky Crafts, MD;  Location: [facility] INVASIVE CV LAB;  Service: Cardiovascular;  Laterality: N/A;   RIGHT/LEFT HEART CATH AND CORONARY ANGIOGRAPHY N/A 12/29/2016   Procedure: RIGHT/LEFT HEART CATH AND CORONARY ANGIOGRAPHY;  Surgeon: Corky Crafts, MD;  Location: [facility] INVASIVE CV LAB;  Service: Cardiovascular;  Laterality: N/A;   SHOULDER ARTHROSCOPY WITH ROTATOR CUFF REPAIR AND OPEN BICEPS TENODESIS Right 11/28/2021   Procedure: RIGHT SHOULDER ARTHROSCOPY WITH ROTATOR CUFF REPAIR AND OPEN BICEPS TENODESIS;  Surgeon: Huel Cote, MD;  Location: [facility] OR;  Service: Orthopedics;  Laterality: Right;   SHOULDER ARTHROSCOPY WITH ROTATOR CUFF REPAIR AND OPEN BICEPS TENODESIS Left 07/21/2022   Procedure: LEFT SHOULDER ARTHROSCOPY WITH ROTATOR CUFF REPAIR AND BICEPS TENODESIS;  Surgeon: Huel Cote, MD;  Location: [facility] OR;  Service: Orthopedics;  Laterality: Left;   TONSILLECTOMY     TOTAL  HIP ARTHROPLASTY Right 04/26/2018   Procedure: RIGHT TOTAL HIP ARTHROPLASTY ANTERIOR APPROACH;  Surgeon: Tarry Kos, MD;  Location: [facility] OR;  Service: Orthopedics;  Laterality: Right;   TOTAL HIP ARTHROPLASTY Left 11/15/2018   TOTAL HIP ARTHROPLASTY Left 11/15/2018   Procedure: LEFT TOTAL HIP ARTHROPLASTY ANTERIOR APPROACH;  Surgeon: Tarry Kos, MD;  Location: [facility] OR;  Service: Orthopedics;  Laterality: Left;   Patient Active Problem List   Diagnosis Date Noted   Nontraumatic complete tear of left rotator cuff 07/21/2022   Biceps tendinitis of right upper extremity    Rotator cuff syndrome of right shoulder 10/16/2021   Other insomnia 04/30/2021   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   COVID-19 vaccine regimen to maintain immunity completed 04/27/2020   Statin intolerance 12/10/2018   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/15/2018   Chronic left shoulder pain 06/08/2018   Status post total hip replacement, right 04/26/2018   Mild dilation of ascending aorta 12/26/2017   Dyslipidemia 12/26/2017   Bilateral carotid bruits 12/26/2017   Nocturnal hypoxemia 06/29/2017   Primary osteoarthritis of both hips 06/29/2017   Chronic diastolic heart failure    Coronary artery disease involving native coronary artery of native heart without angina pectoris 03/31/2017   Ischemic cardiomyopathy 03/31/2017   Marijuana use 03/31/2017   Prediabetes 03/31/2017   Aortic valve insufficiency 12/28/2016   Mitral valve regurgitation 12/28/2016  Obesity 12/28/2016   Aortic valve stenosis, nonrheumatic    Essential hypertension      REFERRING PROVIDER: Steward DroneBokshan, MD  REFERRING DIAG: [phone] (ICD-10-CM) - Nontraumatic complete tear of left rotator cuff  S/p Lt RCR, biceps tenodesis  THERAPY DIAG:  Muscle weakness (generalized)  Stiffness of left shoulder, not elsewhere classified  Acute pain of left shoulder  Rationale for Evaluation and Treatment: Rehabilitation  ONSET DATE: DOS  07/21/22  SUBJECTIVE:                                                                                                                                                                                      SUBJECTIVE STATEMENT: Doing ok overall, the left shoulder hurts.   Hand dominance: Right  PERTINENT HISTORY: Chronic shoulder pain, osteoarthritis, CHF  PAIN:  Are you having pain? Yes: NPRS scale: 6/10 Pain location: Lt shoulder Pain description: ache Aggravating factors: constant Relieving factors: ice  PRECAUTIONS: Shoulder  WEIGHT BEARING RESTRICTIONS:  POSTOPERATIVE PLAN: He will be nonweightbearing in a sling at all seen by physical therapy at which point we will begin passive range of motion.   FALLS:  Has patient fallen in last 6 months? No  LIVING ENVIRONMENT: Lives with: lives alone  OCCUPATION: retired  PLOF: Independent  PATIENT GOALS: reach the top shelf   OBJECTIVE:   PATIENT SURVEYS:  UEFS: 0  COGNITION: Overall cognitive status: Within functional limits for tasks assessed     SENSATION: WFL  POSTURE: EVAL: forward rounded shoulder as expected in sling  UPPER EXTREMITY ROM:   Passive ROM Left eval  Shoulder flexion 90  Shoulder extension   Shoulder abduction   Shoulder adduction   Shoulder internal rotation   Shoulder external rotation   Elbow flexion   Elbow extension   (Blank rows = not tested)  UPPER EXTREMITY MMT:  MMT    Shoulder flexion    Shoulder extension    Shoulder abduction    Shoulder adduction    Shoulder internal rotation    Shoulder external rotation    Middle trapezius    Lower trapezius    Elbow flexion    Elbow extension    Wrist flexion    Wrist extension    Wrist ulnar deviation    Wrist radial deviation    Wrist pronation    Wrist supination    Grip strength (lbs)    (Blank rows = not tested)     TODAY'S TREATMENT:  Treatment                            EVAL 07/24/22:  Bandage changes- no s/s of infection Pendulum Discussed putty grip Scapular retraction Assisted elbow flexion/ext   PATIENT EDUCATION: Education details: Anatomy of condition, POC, HEP, exercise form/rationale Person educated: Patient Education method: Explanation, Demonstration, Tactile cues, Verbal cues, and Handouts Education comprehension: verbalized understanding, returned demonstration, verbal cues required, tactile cues required, and needs further education  HOME EXERCISE PROGRAM: [id number]  ASSESSMENT:  CLINICAL IMPRESSION: Patient is a 61y.o. male who was seen today for physical therapy evaluation and treatment for physical therapy status post left rotator cuff repair and biceps tenodesis.   OBJECTIVE IMPAIRMENTS: decreased activity tolerance, decreased mobility, decreased ROM, decreased strength, increased muscle spasms, impaired flexibility, impaired UE functional use, improper body mechanics, postural dysfunction, and pain.   ACTIVITY LIMITATIONS: carrying, lifting, sleeping, bathing, dressing, reach over head, and hygiene/grooming  PARTICIPATION LIMITATIONS: meal prep, cleaning, laundry, driving, shopping, community activity, and yard work  PERSONAL FACTORS: Time since onset of injury/illness/exacerbation and OA, CHF  are also affecting patient's functional outcome.   REHAB POTENTIAL: Good  CLINICAL DECISION MAKING: Stable/uncomplicated  EVALUATION COMPLEXITY: Low   GOALS: Goals reviewed with patient? Yes  SHORT TERM GOALS: Target date: 5/3  Passive flexion to 140 deg without increase in pain Baseline:90 Goal status: INITIAL  2.  Passive abd to 60 deg without increase in pain Baseline: 30 Goal status: INITIAL  3.  Passive ER at side to 40 deg without increase in pain Baseline: 0 Goal status: INITIAL  4.  Able to demo proper scapular  retraction for proximal shoulder girdle support Baseline: began educating at eval Goal status: INITIAL   LONG TERM GOALS: Target date: 10/24/22  Able to demo AROM to shoulder height, against gravity, with good scapular control Baseline: unable at eval Goal status: INITIAL  Target date: 09/18/2022  8 weeks  2.  Will begin light resistance exercises pain <=3/10 Baseline: unable at eval Goal status: INITIAL Target date: 09/18/2022  8 weeks  3.  Full AROM within 10 deg of opp UE without increase in pain Baseline:  Goal status: unableINITIAL Target date: 10/16/2022  12 weeks  4.  Proper scapular control in proprioceptive and CKC strengthening Baseline: unable at eval Goal status: INITIAL Target date: 10/16/2022  12 weeks  5.  Able to lift cups/plates and other small objects into overhead cabinets without limitation by shoulder Baseline: unable Goal status: INITIAL Target date: 10/16/2022  12 weeks   PLAN:  PT FREQUENCY: 1-2x/week  PT DURATION: other: 13 weeks  PLANNED INTERVENTIONS: Therapeutic exercises, Therapeutic activity, Neuromuscular re-education, Patient/Family education, Self Care, Joint mobilization, Dry Needling, Electrical stimulation, Spinal mobilization, Cryotherapy, Moist heat, scar mobilization, Taping, Traction, Manual therapy, and Re-evaluation  PLAN FOR NEXT SESSION: continue per protocol  Roxie Kreeger C. Moani Weipert PT, DPT 07/24/22 5:22 PM   Check all possible CPT codes: 11572 - PT Re-evaluation, 97110- Therapeutic Exercise, [phone]- Neuro Re-education, 97140 - Manual Therapy, 97530 - Therapeutic Activities, 97535 - Self Care, [phone] - Electrical stimulation (unattended), and [phone] - Aquatic therapy    Check all conditions that are expected to impact treatment: None of these apply   If treatment provided at initial evaluation, no treatment charged due to lack of authorization.

## 2022-07-27 ENCOUNTER — Other Ambulatory Visit: Payer: Self-pay | Admitting: Internal Medicine

## 2022-07-27 DIAGNOSIS — I10 Essential (primary) hypertension: Secondary | ICD-10-CM

## 2022-08-01 ENCOUNTER — Ambulatory Visit (INDEPENDENT_AMBULATORY_CARE_PROVIDER_SITE_OTHER): Payer: Medicaid Other | Admitting: Orthopaedic Surgery

## 2022-08-01 DIAGNOSIS — M75122 Complete rotator cuff tear or rupture of left shoulder, not specified as traumatic: Secondary | ICD-10-CM

## 2022-08-01 NOTE — Progress Notes (Signed)
Post Operative Evaluation    Procedure/Date of Surgery: 07/21/22  Interval History:   Status post left shoulder arthroscopy with rotator cuff repair and biceps tenotomy.  Overall he is doing very well.  He has no pain in the shoulder at today's visit.  He has begun working with physical therapy.  He has been compliant with aspirin usage.   PMH/PSH/Family History/Social History/Meds/Allergies:    Past Medical History:  Diagnosis Date   Aortic stenosis    CAD (coronary artery disease), native coronary artery 12/26/2016   DES LAD & RCA, EF 25-30%   CHF (congestive heart failure)    Dyslipidemia    Hypertension    Obesity    Osteoarthritis    Pre-diabetes    a. prior A1C 5.6, states he was on meds for this at one point.   Past Surgical History:  Procedure Laterality Date   CORONARY STENT INTERVENTION N/A 12/29/2016   Procedure: CORONARY STENT INTERVENTION;  Surgeon: Corky Crafts, MD;  Location: [facility] INVASIVE CV LAB;  Service: Cardiovascular;  Laterality: N/A;   RIGHT/LEFT HEART CATH AND CORONARY ANGIOGRAPHY N/A 12/29/2016   Procedure: RIGHT/LEFT HEART CATH AND CORONARY ANGIOGRAPHY;  Surgeon: Corky Crafts, MD;  Location: [facility] INVASIVE CV LAB;  Service: Cardiovascular;  Laterality: N/A;   SHOULDER ARTHROSCOPY WITH ROTATOR CUFF REPAIR AND OPEN BICEPS TENODESIS Right 11/28/2021   Procedure: RIGHT SHOULDER ARTHROSCOPY WITH ROTATOR CUFF REPAIR AND OPEN BICEPS TENODESIS;  Surgeon: Huel Cote, MD;  Location: [facility] OR;  Service: Orthopedics;  Laterality: Right;   SHOULDER ARTHROSCOPY WITH ROTATOR CUFF REPAIR AND OPEN BICEPS TENODESIS Left 07/21/2022   Procedure: LEFT SHOULDER ARTHROSCOPY WITH ROTATOR CUFF REPAIR AND BICEPS TENODESIS;  Surgeon: Huel Cote, MD;  Location: [facility] OR;  Service: Orthopedics;  Laterality: Left;   TONSILLECTOMY     TOTAL HIP ARTHROPLASTY Right 04/26/2018   Procedure: RIGHT TOTAL HIP ARTHROPLASTY ANTERIOR APPROACH;   Surgeon: Tarry Kos, MD;  Location: [facility] OR;  Service: Orthopedics;  Laterality: Right;   TOTAL HIP ARTHROPLASTY Left 11/15/2018   TOTAL HIP ARTHROPLASTY Left 11/15/2018   Procedure: LEFT TOTAL HIP ARTHROPLASTY ANTERIOR APPROACH;  Surgeon: Tarry Kos, MD;  Location: [facility] OR;  Service: Orthopedics;  Laterality: Left;   Social History   Socioeconomic History   Marital status: Single    Spouse name: Not on file   Number of children: Not on file   Years of education: Not on file   Highest education level: Not on file  Occupational History   Not on file  Tobacco Use   Smoking status: Former    Types: Cigarettes    Passive exposure: Past   Smokeless tobacco: Never   Tobacco comments:    Smoked lightly for approx 20 years, quit 1990s  Vaping Use   Vaping Use: Never used  Substance and Sexual Activity   Alcohol use: Yes    Comment: occasionally   Drug use: Yes    Types: Marijuana    Comment: every night (to sleep)   Sexual activity: Not on file  Other Topics Concern   Not on file  Social History Narrative   Lives Home alone. Sister makes all health related decisions. Declined Advanced directive at this time   Social Determinants of Health   Financial Resource Strain: Not on file  Food Insecurity: Not  on file  Transportation Needs: Not on file  Physical Activity: Not on file  Stress: Not on file  Social Connections: Not on file   Family History  Problem Relation Age of Onset   CAD Father        MI/CABG age 28   Allergies  Allergen Reactions   Statins Other (See Comments)    Elevated LFTs   Current Outpatient Medications  Medication Sig Dispense Refill   amLODipine (NORVASC) 5 MG tablet Take 1 tablet (5 mg total) by mouth daily. Please make a PCP appointment for more refills. 30 tablet 0   aspirin EC 325 MG tablet Take 1 tablet (325 mg total) by mouth daily. 30 tablet 0   carvedilol (COREG) 3.125 MG tablet Take 1 tablet (3.125 mg total) by mouth 2 (two) times daily.  180 tablet 2   Evolocumab (REPATHA SURECLICK) 140 MG/ML SOAJ Inject 1 mL into the skin every 14 (fourteen) days. 6 mL 3   fluticasone (FLONASE) 50 MCG/ACT nasal spray PLACE 1 SPRAY INTO BOTH NOSTRILS DAILY AS NEEDED FOR ALLERGIES OR RHINITIS. 48 mL 1   lisinopril (ZESTRIL) 10 MG tablet Take 1 tablet (10 mg total) by mouth 2 (two) times daily. Please make a PCP appointment. 60 tablet 0   loratadine (CLARITIN) 10 MG tablet Take 10 mg by mouth daily as needed for allergies.     methocarbamol (ROBAXIN) 500 MG tablet Take 1 tablet (500 mg total) by mouth 2 (two) times daily as needed for muscle spasms. 20 tablet 2   oxyCODONE (ROXICODONE) 5 MG immediate release tablet Take 1 tablet (5 mg total) by mouth every 4 (four) hours as needed for severe pain or breakthrough pain. 30 tablet 0   potassium chloride (KLOR-CON) 10 MEQ tablet Take 1 tablet (10 mEq total) by mouth daily. Please make PCP appointment. 30 tablet 0   No current facility-administered medications for this visit.   No results found.  Review of Systems:   A ROS was performed including pertinent positives and negatives as documented in the HPI.   Musculoskeletal Exam:    There were no vitals taken for this visit.  Left shoulder incisions are well-appearing without erythema or drainage.  He is able to forward elevate to 90 degrees passively in the sitting position with external rotation at the side to 50 degrees.  Internal rotation deferred today.  Distal neurosensory exam is intact  Imaging:      I personally reviewed and interpreted the radiographs.   Assessment:   2 weeks status post left shoulder rotator cuff repair and biceps tenotomy overall doing very well.  At this time he will continue to advance according to the rotator cuff repair protocol.  I will plan to see him back in 4 weeks for reassessment  Plan :    -Return to clinic in 4 weeks for reassessment      I personally saw and evaluated the patient, and  participated in the management and treatment plan.  Huel Cote, MD Attending Physician, Orthopedic Surgery  This document was dictated using Dragon voice recognition software. A reasonable attempt at proof reading has been made to minimize errors.

## 2022-08-04 ENCOUNTER — Encounter (HOSPITAL_BASED_OUTPATIENT_CLINIC_OR_DEPARTMENT_OTHER): Payer: Self-pay

## 2022-08-04 ENCOUNTER — Ambulatory Visit (HOSPITAL_BASED_OUTPATIENT_CLINIC_OR_DEPARTMENT_OTHER): Payer: Medicaid Other | Admitting: Physical Therapy

## 2022-08-09 ENCOUNTER — Other Ambulatory Visit: Payer: Self-pay | Admitting: Internal Medicine

## 2022-08-09 DIAGNOSIS — I10 Essential (primary) hypertension: Secondary | ICD-10-CM

## 2022-08-11 ENCOUNTER — Encounter (HOSPITAL_BASED_OUTPATIENT_CLINIC_OR_DEPARTMENT_OTHER): Payer: Self-pay | Admitting: Physical Therapy

## 2022-08-11 ENCOUNTER — Telehealth (HOSPITAL_BASED_OUTPATIENT_CLINIC_OR_DEPARTMENT_OTHER): Payer: Self-pay | Admitting: Physical Therapy

## 2022-08-11 ENCOUNTER — Ambulatory Visit (HOSPITAL_BASED_OUTPATIENT_CLINIC_OR_DEPARTMENT_OTHER): Payer: Medicaid Other | Admitting: Physical Therapy

## 2022-08-11 NOTE — Telephone Encounter (Signed)
Called pt to confirm his physical therapy appointment today @ 9:30am. Pt did not answer and mailbox was full, so I was unable to leave a message. Tried calling cell phone, but was sent straight to vm and did not leave message on his cell per pt request.

## 2022-08-19 ENCOUNTER — Ambulatory Visit (HOSPITAL_BASED_OUTPATIENT_CLINIC_OR_DEPARTMENT_OTHER): Payer: Medicaid Other | Attending: Orthopaedic Surgery | Admitting: Physical Therapy

## 2022-08-19 ENCOUNTER — Encounter (HOSPITAL_BASED_OUTPATIENT_CLINIC_OR_DEPARTMENT_OTHER): Payer: Self-pay | Admitting: Physical Therapy

## 2022-08-19 DIAGNOSIS — M25612 Stiffness of left shoulder, not elsewhere classified: Secondary | ICD-10-CM | POA: Insufficient documentation

## 2022-08-19 DIAGNOSIS — M75122 Complete rotator cuff tear or rupture of left shoulder, not specified as traumatic: Secondary | ICD-10-CM | POA: Diagnosis present

## 2022-08-19 DIAGNOSIS — M6281 Muscle weakness (generalized): Secondary | ICD-10-CM | POA: Diagnosis present

## 2022-08-19 DIAGNOSIS — M25512 Pain in left shoulder: Secondary | ICD-10-CM

## 2022-08-19 NOTE — Therapy (Signed)
OUTPATIENT PHYSICAL THERAPY SHOULDER EVALUATION   Patient Name: Joshua [name] MRN: [id number] DOB:[birth year], 62 y.o., male Today's Date: 08/19/2022  END OF SESSION:  PT End of Session - 08/19/22 1435     Visit Number 2    Number of Visits 14    Date for PT Re-Evaluation 10/24/22    Authorization Type [employer] MCD    PT Start Time 1400    PT Stop Time 1430    PT Time Calculation (min) 30 min    Activity Tolerance Patient tolerated treatment well    Behavior During Therapy WFL for tasks assessed/performed              Past Medical History:  Diagnosis Date   Aortic stenosis    CAD (coronary artery disease), native coronary artery 12/26/2016   DES LAD & RCA, EF 25-30%   CHF (congestive heart failure) (HCC)    Dyslipidemia    Hypertension    Obesity    Osteoarthritis    Pre-diabetes    a. prior A1C 5.6, states he was on meds for this at one point.   Past Surgical History:  Procedure Laterality Date   CORONARY STENT INTERVENTION N/A 12/29/2016   Procedure: CORONARY STENT INTERVENTION;  Surgeon: Corky Crafts, MD;  Location: [facility] INVASIVE CV LAB;  Service: Cardiovascular;  Laterality: N/A;   RIGHT/LEFT HEART CATH AND CORONARY ANGIOGRAPHY N/A 12/29/2016   Procedure: RIGHT/LEFT HEART CATH AND CORONARY ANGIOGRAPHY;  Surgeon: Corky Crafts, MD;  Location: [facility] INVASIVE CV LAB;  Service: Cardiovascular;  Laterality: N/A;   SHOULDER ARTHROSCOPY WITH ROTATOR CUFF REPAIR AND OPEN BICEPS TENODESIS Right 11/28/2021   Procedure: RIGHT SHOULDER ARTHROSCOPY WITH ROTATOR CUFF REPAIR AND OPEN BICEPS TENODESIS;  Surgeon: Huel Cote, MD;  Location: [facility] OR;  Service: Orthopedics;  Laterality: Right;   SHOULDER ARTHROSCOPY WITH ROTATOR CUFF REPAIR AND OPEN BICEPS TENODESIS Left 07/21/2022   Procedure: LEFT SHOULDER ARTHROSCOPY WITH ROTATOR CUFF REPAIR AND BICEPS TENODESIS;  Surgeon: Huel Cote, MD;  Location: [facility] OR;  Service: Orthopedics;  Laterality: Left;   TONSILLECTOMY      TOTAL HIP ARTHROPLASTY Right 04/26/2018   Procedure: RIGHT TOTAL HIP ARTHROPLASTY ANTERIOR APPROACH;  Surgeon: Tarry Kos, MD;  Location: [facility] OR;  Service: Orthopedics;  Laterality: Right;   TOTAL HIP ARTHROPLASTY Left 11/15/2018   TOTAL HIP ARTHROPLASTY Left 11/15/2018   Procedure: LEFT TOTAL HIP ARTHROPLASTY ANTERIOR APPROACH;  Surgeon: Tarry Kos, MD;  Location: [facility] OR;  Service: Orthopedics;  Laterality: Left;   Patient Active Problem List   Diagnosis Date Noted   Nontraumatic complete tear of left rotator cuff 07/21/2022   Biceps tendinitis of right upper extremity    Rotator cuff syndrome of right shoulder 10/16/2021   Other insomnia 04/30/2021   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   COVID-19 vaccine regimen to maintain immunity completed 04/27/2020   Statin intolerance 12/10/2018   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/15/2018   Chronic left shoulder pain 06/08/2018   Status post total hip replacement, right 04/26/2018   Mild dilation of ascending aorta (HCC) 12/26/2017   Dyslipidemia 12/26/2017   Bilateral carotid bruits 12/26/2017   Nocturnal hypoxemia 06/29/2017   Primary osteoarthritis of both hips 06/29/2017   Chronic diastolic heart failure (HCC)    Coronary artery disease involving native coronary artery of native heart without angina pectoris 03/31/2017   Ischemic cardiomyopathy 03/31/2017   Marijuana use 03/31/2017   Prediabetes 03/31/2017   Aortic valve insufficiency 12/28/2016  Mitral valve regurgitation 12/28/2016   Obesity 12/28/2016   Aortic valve stenosis, nonrheumatic    Essential hypertension      REFERRING PROVIDER: Steward Drone, MD  REFERRING DIAG: [phone] (ICD-10-CM) - Nontraumatic complete tear of left rotator cuff  S/p Lt RCR, biceps tenodesis  THERAPY DIAG:  Muscle weakness (generalized)  Stiffness of left shoulder, not elsewhere classified  Acute pain of left shoulder  Rationale for Evaluation and Treatment:  Rehabilitation  ONSET DATE: DOS 07/21/22  Days since surgery: 29   SUBJECTIVE:                                                                                                                                                                                      SUBJECTIVE STATEMENT: Doing ok overall, the left shoulder hurts.   Hand dominance: Right  PERTINENT HISTORY: Chronic shoulder pain, osteoarthritis, CHF  PAIN:  Are you having pain? Yes: NPRS scale: 6/10 Pain location: Lt shoulder Pain description: ache Aggravating factors: constant Relieving factors: ice  PRECAUTIONS: Shoulder  WEIGHT BEARING RESTRICTIONS:  POSTOPERATIVE PLAN: He will be nonweightbearing in a sling at all seen by physical therapy at which point we will begin passive range of motion.   FALLS:  Has patient fallen in last 6 months? No  LIVING ENVIRONMENT: Lives with: lives alone  OCCUPATION: retired  PLOF: Independent  PATIENT GOALS: reach the top shelf   OBJECTIVE:   PATIENT SURVEYS:  UEFS: 0  COGNITION: Overall cognitive status: Within functional limits for tasks assessed     SENSATION: WFL  POSTURE: EVAL: forward rounded shoulder as expected in sling  UPPER EXTREMITY ROM:   Passive ROM Left eval  Shoulder flexion 90  Shoulder extension   Shoulder abduction   Shoulder adduction   Shoulder internal rotation   Shoulder external rotation   Elbow flexion   Elbow extension   (Blank rows = not tested)  UPPER EXTREMITY MMT:  MMT    Shoulder flexion    Shoulder extension    Shoulder abduction    Shoulder adduction    Shoulder internal rotation    Shoulder external rotation    Middle trapezius    Lower trapezius    Elbow flexion    Elbow extension    Wrist flexion    Wrist extension    Wrist ulnar deviation    Wrist radial deviation    Wrist pronation    Wrist supination    Grip strength (lbs)    (Blank rows = not tested)     TODAY'S TREATMENT:      08/19/22  PROM to pt tolerance: 145 flexion, 90 ABD, IR 50, ER 35  Exercises -  Seated Scapular Retraction  - 5 x daily - 7 x weekly - 1 sets - 10 reps - Seated Elbow Flexion AAROM  - 3 x daily - 7 x weekly - 2 sets - 10 reps - Seated Shoulder Flexion AAROM with Pulley Behind  - 2 x daily - 7 x weekly - 1 sets - 1 reps - 2 min hold - Seated Shoulder Scaption AAROM with Pulley at Side  - 2 x daily - 7 x weekly - 1 sets - 1 reps - 2 min hold - Seated Shoulder Abduction AAROM with Pulley Behind  - 2 x daily - 7 x weekly - 1 sets - 1 reps - 2 min hold                                                                                                                                       Treatment                            EVAL 07/24/22:  Bandage changes- no s/s of infection Pendulum Discussed putty grip Scapular retraction Assisted elbow flexion/ext   PATIENT EDUCATION: Education details: Anatomy of condition, POC, HEP, exercise form/rationale, protocol limits Person educated: Patient Education method: Explanation, Demonstration, Tactile cues, Verbal cues, and Handouts Education comprehension: verbalized understanding, returned demonstration, verbal cues required, tactile cues required, and needs further education  HOME EXERCISE PROGRAM:  Access Code: [email or web address] URL: [email or web address] Date: 08/19/2022 Prepared by: Zebedee Iba  ASSESSMENT:  CLINICAL IMPRESSION: Pt returns to PT 4 wks post-op. Pt does have good ROM of the L shoulder without pain. Pt able to progress OH reaching motions today to protocol limits at 4+ wks phase with good tolerance. AAROM introduced for home. Plan to continue per protocol at next. HEP streamlined due to history of pt compliance. Pt would benefit from continued skilled therapy in order to reach goals and maximize functional L UE strength and ROM for full return to PLOF.   OBJECTIVE IMPAIRMENTS: decreased activity tolerance, decreased mobility,  decreased ROM, decreased strength, increased muscle spasms, impaired flexibility, impaired UE functional use, improper body mechanics, postural dysfunction, and pain.   ACTIVITY LIMITATIONS: carrying, lifting, sleeping, bathing, dressing, reach over head, and hygiene/grooming  PARTICIPATION LIMITATIONS: meal prep, cleaning, laundry, driving, shopping, community activity, and yard work  PERSONAL FACTORS: Time since onset of injury/illness/exacerbation and OA, CHF  are also affecting patient's functional outcome.   REHAB POTENTIAL: Good  CLINICAL DECISION MAKING: Stable/uncomplicated  EVALUATION COMPLEXITY: Low   GOALS: Goals reviewed with patient? Yes  SHORT TERM GOALS: Target date: 5/3  Passive flexion to 140 deg without increase in pain Baseline:90 Goal status: INITIAL  2.  Passive abd to 60 deg without increase in pain Baseline: 30 Goal status: INITIAL  3.  Passive ER at side to 40 deg without increase in pain Baseline: 0 Goal status: INITIAL  4.  Able to demo proper scapular retraction for proximal shoulder girdle support Baseline: began educating at eval Goal status: INITIAL   LONG TERM GOALS: Target date: 10/24/22  Able to demo AROM to shoulder height, against gravity, with good scapular control Baseline: unable at eval Goal status: INITIAL  Target date: 09/18/2022  8 weeks  2.  Will begin light resistance exercises pain <=3/10 Baseline: unable at eval Goal status: INITIAL Target date: 09/18/2022  8 weeks  3.  Full AROM within 10 deg of opp UE without increase in pain Baseline:  Goal status: unableINITIAL Target date: 10/16/2022  12 weeks  4.  Proper scapular control in proprioceptive and CKC strengthening Baseline: unable at eval Goal status: INITIAL Target date: 11/11/2022  12 weeks  5.  Able to lift cups/plates and other small objects into overhead cabinets without limitation by shoulder Baseline: unable Goal status: INITIAL Target date: 11/11/2022  12  weeks   PLAN:  PT FREQUENCY: 1-2x/week  PT DURATION: other: 13 weeks  PLANNED INTERVENTIONS: Therapeutic exercises, Therapeutic activity, Neuromuscular re-education, Patient/Family education, Self Care, Joint mobilization, Dry Needling, Electrical stimulation, Spinal mobilization, Cryotherapy, Moist heat, scar mobilization, Taping, Traction, Manual therapy, and Re-evaluation  PLAN FOR NEXT SESSION: continue per protocol  Zebedee Iba PT, DPT 08/19/22 2:39 PM   Check all possible CPT codes: 78295 - PT Re-evaluation, 97110- Therapeutic Exercise, 97112- Neuro Re-education, 97140 - Manual Therapy, 97530 - Therapeutic Activities, 97535 - Self Care, 97014 - Electrical stimulation (unattended), and [id number] - Aquatic therapy    Check all conditions that are expected to impact treatment: None of these apply   If treatment provided at initial evaluation, no treatment charged due to lack of authorization.

## 2022-08-26 ENCOUNTER — Encounter (HOSPITAL_BASED_OUTPATIENT_CLINIC_OR_DEPARTMENT_OTHER): Payer: Medicaid Other | Admitting: Physical Therapy

## 2022-08-31 ENCOUNTER — Other Ambulatory Visit: Payer: Self-pay | Admitting: Internal Medicine

## 2022-08-31 DIAGNOSIS — R0981 Nasal congestion: Secondary | ICD-10-CM

## 2022-09-01 ENCOUNTER — Encounter: Payer: Self-pay | Admitting: Cardiovascular Disease

## 2022-09-01 ENCOUNTER — Other Ambulatory Visit: Payer: Self-pay | Admitting: Pharmacist

## 2022-09-01 ENCOUNTER — Ambulatory Visit: Payer: Medicaid Other | Attending: Cardiovascular Disease | Admitting: Cardiovascular Disease

## 2022-09-01 VITALS — BP 124/70 | HR 67 | Ht 73.0 in | Wt 216.2 lb

## 2022-09-01 DIAGNOSIS — I350 Nonrheumatic aortic (valve) stenosis: Secondary | ICD-10-CM

## 2022-09-01 DIAGNOSIS — I10 Essential (primary) hypertension: Secondary | ICD-10-CM

## 2022-09-01 MED ORDER — AMLODIPINE BESYLATE 5 MG PO TABS
5.0000 mg | ORAL_TABLET | Freq: Every day | ORAL | 0 refills | Status: DC
Start: 2022-09-01 — End: 2022-12-30

## 2022-09-01 NOTE — Patient Instructions (Signed)
Medication Instructions:  NO CHANGES *If you need a refill on your cardiac medications before your next appointment, please call your pharmacy*   Lab Work: NONE   Testing/Procedures: DUE IN OCTOBER 2024 Your physician has requested that you have an echocardiogram. Echocardiography is a painless test that uses sound waves to create images of your heart. It provides your doctor with information about the size and shape of your heart and how well your heart's chambers and valves are working. This procedure takes approximately one hour. There are no restrictions for this procedure. Please do NOT wear cologne, perfume, aftershave, or lotions (deodorant is allowed). Please arrive 15 minutes prior to your appointment time.   Follow-Up: At [facility], you and your health needs are our priority.  As part of our continuing mission to provide you with exceptional heart care, we have created designated Provider Care Teams.  These Care Teams include your primary Cardiologist (physician) and Advanced Practice Providers (APPs -  Physician Assistants and Nurse Practitioners) who all work together to provide you with the care you need, when you need it.   Your next appointment:   6 month(s)  Provider:   Verne Carrow, MD

## 2022-09-01 NOTE — Progress Notes (Signed)
[facility] Consult Note  Chief Complaint  Patient presents with   New Patient (Initial Visit)    Severe aortic stenosis   History of Present Illness: 62 yo Joshua [name] with history of CAD, chronic systolic CHF, hyperlipidemia, HTN, obesity and aortic stenosis here today as a new consult, referred by Dr. Royann Shivers, for further discussion regarding his aortic stenosis. He presented with CHF in 2018 and was found to have severe LV systolic dysfunction with LVEF=25-30%. Cardiac cath with severe two vessel CAD. Drug eluting stents were placed in the LAD and RCA. LV function returned to normal following his revascularization. He has been followed for bicuspid aortic stenosis in our office by Dr. Royann Shivers. Echo 07/16/22 with LVEF=55-60%. Severe aortic stenosis with mean gradient 42 mmHg, peak gradient 64 mmHg, AVA 0.6 cm2, DI 0.18, SVI 23. The valve appears to be bicuspid.   He tells me today that he has no dyspnea, fatigue, chest pain or lower extremity edema. He is recovering from left shoulder surgery on April 8th but overall feels geat. He lives in [place], [place] by himself. 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 He has not seen a dentist lately. He has a broken tooth.   Primary Care Physician: Marcine Matar, MD Primary Cardiologist: Cro[place] Referring Cardiologist: Croitoru  Past Medical History:  Diagnosis Date   Aortic stenosis    CAD (coronary artery disease), native coronary artery 12/26/2016   DES LAD & RCA, EF 25-30%   CHF (congestive heart failure) (HCC)    Dyslipidemia    Hypertension    Obesity    Osteoarthritis    Pre-diabetes    a. prior A1C 5.6, states he was on meds for this at one point.    Past Surgical History:  Procedure Laterality Date   CORONARY STENT INTERVENTION N/A 12/29/2016   Procedure: CORONARY STENT INTERVENTION;  Surgeon: Corky Crafts, MD;  Location: [facility] INVASIVE CV LAB;  Service: Cardiovascular;  Laterality: N/A;   RIGHT/LEFT HEART CATH AND CORONARY  ANGIOGRAPHY N/A 12/29/2016   Procedure: RIGHT/LEFT HEART CATH AND CORONARY ANGIOGRAPHY;  Surgeon: Corky Crafts, MD;  Location: [facility] INVASIVE CV LAB;  Service: Cardiovascular;  Laterality: N/A;   SHOULDER ARTHROSCOPY WITH ROTATOR CUFF REPAIR AND OPEN BICEPS TENODESIS Right 11/28/2021   Procedure: RIGHT SHOULDER ARTHROSCOPY WITH ROTATOR CUFF REPAIR AND OPEN BICEPS TENODESIS;  Surgeon: Huel Cote, MD;  Location: [facility] OR;  Service: Orthopedics;  Laterality: Right;   SHOULDER ARTHROSCOPY WITH ROTATOR CUFF REPAIR AND OPEN BICEPS TENODESIS Left 07/21/2022   Procedure: LEFT SHOULDER ARTHROSCOPY WITH ROTATOR CUFF REPAIR AND BICEPS TENODESIS;  Surgeon: Huel Cote, MD;  Location: [facility] OR;  Service: Orthopedics;  Laterality: Left;   TONSILLECTOMY     TOTAL HIP ARTHROPLASTY Right 04/26/2018   Procedure: RIGHT TOTAL HIP ARTHROPLASTY ANTERIOR APPROACH;  Surgeon: Tarry Kos, MD;  Location: [facility] OR;  Service: Orthopedics;  Laterality: Right;   TOTAL HIP ARTHROPLASTY Left 11/15/2018   TOTAL HIP ARTHROPLASTY Left 11/15/2018   Procedure: LEFT TOTAL HIP ARTHROPLASTY ANTERIOR APPROACH;  Surgeon: Tarry Kos, MD;  Location: [facility] OR;  Service: Orthopedics;  Laterality: Left;    Current Outpatient Medications  Medication Sig Dispense Refill   amLODipine (NORVASC) 5 MG tablet Take 1 tablet (5 mg total) by mouth daily. Please make a PCP appointment for more refills. 30 tablet 0   aspirin EC 325 MG tablet Take 1 tablet (325 mg total) by mouth daily. 30 tablet 0   carvedilol (COREG) 3.125 MG tablet Take 1  tablet (3.125 mg total) by mouth 2 (two) times daily. 180 tablet 2   Evolocumab (REPATHA SURECLICK) 140 MG/ML SOAJ Inject 1 mL into the skin every 14 (fourteen) days. 6 mL 3   fluticasone (FLONASE) 50 MCG/ACT nasal spray PLACE 1 SPRAY INTO BOTH NOSTRILS DAILY AS NEEDED FOR ALLERGIES OR RHINITIS. 48 mL 1   lisinopril (ZESTRIL) 10 MG tablet Take 1 tablet (10 mg total) by mouth 2 (two) times daily. Please make a PCP  appointment. 60 tablet 0   loratadine (CLARITIN) 10 MG tablet Take 10 mg by mouth daily as needed for allergies.     methocarbamol (ROBAXIN) 500 MG tablet Take 1 tablet (500 mg total) by mouth 2 (two) times daily as needed for muscle spasms. 20 tablet 2   oxyCODONE (ROXICODONE) 5 MG immediate release tablet Take 1 tablet (5 mg total) by mouth every 4 (four) hours as needed for severe pain or breakthrough pain. 30 tablet 0   potassium chloride (KLOR-CON) 10 MEQ tablet Take 1 tablet (10 mEq total) by mouth daily. Please make PCP appointment. 30 tablet 0   No current facility-administered medications for this visit.    Allergies  Allergen Reactions   Statins Other (See Comments)    Elevated LFTs    Social History   Socioeconomic History   Marital status: Single    Spouse name: Not on file   Number of children: 0   Years of education: Not on file   Highest education level: Not on file  Occupational History   Occupation: Retired-Upholstery  Tobacco Use   Smoking status: Former    Packs/day: .5    Types: Cigarettes    Quit date: 08/18/2000    Years since quitting: 22.0    Passive exposure: Past   Smokeless tobacco: Never   Tobacco comments:    Smoked lightly for approx 20 years, quit 1990s  Vaping Use   Vaping Use: Never used  Substance and Sexual Activity   Alcohol use: Yes    Comment: occasionally   Drug use: Yes    Types: Marijuana    Comment: every night (to sleep)   Sexual activity: Not on file  Other Topics Concern   Not on file  Social History Narrative   Lives Home alone. Sister makes all health related decisions. Declined Advanced directive at this time   Social Determinants of Health   Financial Resource Strain: Not on file  Food Insecurity: Not on file  Transportation Needs: Not on file  Physical Activity: Not on file  Stress: Not on file  Social Connections: Not on file  Intimate Partner Violence: Not on file    Family History  Problem Relation Age of  Onset   CAD Father        MI/CABG age 90    Review of Systems:  As stated in the HPI and otherwise negative.   BP 124/70   Pulse 67   Ht 6\' 1"  (1.854 m)   Wt 98.1 kg   SpO2 99%   BMI 28.52 kg/m   Physical Examination: General: Well developed, well nourished, NAD  HEENT: OP clear, mucus membranes moist  SKIN: warm, dry. No rashes. Neuro: No focal deficits  Musculoskeletal: Muscle strength 5/5 all ext  Psychiatric: Mood and affect normal  Neck: No JVD, no carotid bruits, no thyromegaly, no lymphadenopathy.  Lungs:Clear bilaterally, no wheezes, rhonci, crackles Cardiovascular: Regular rate and rhythm. Loud, harsh, late peaking systolic murmur.  Abdomen:Soft. Bowel sounds present. Non-tender.  Extremities: No  lower extremity edema. Pulses are 2 + in the bilateral DP/PT.  EKG:  EKG is not ordered today. The ekg ordered today demonstrates   Echo 07/16/22:  1. The aortic valve is known bicuspid. There is severe calcifcation of  the aortic valve. There is severe thickening of the aortic valve. Aortic  valve regurgitation is mild. Severe aortic valve stenosis. Aortic valve  mean gradient measures 42.0 mmHg.  Aortic valve Vmax measures 4.00 m/s, AVA 0.61cm2, DI 0.2.   2. Left ventricular ejection fraction, by estimation, is 55 to 60%. The  left ventricle has normal function. The left ventricle has no regional  wall motion abnormalities. There is mild concentric left ventricular  hypertrophy. Left ventricular diastolic  parameters are consistent with Grade I diastolic dysfunction (impaired  relaxation). The average left ventricular global longitudinal strain is  -13.4 %. The global longitudinal strain is abnormal.   3. Right ventricular systolic function is normal. The right ventricular  size is normal.   4. Left atrial size was mildly dilated.   5. The mitral valve is normal in structure. Trivial mitral valve  regurgitation. No evidence of mitral stenosis.   6. The inferior vena  cava is normal in size with greater than 50%  respiratory variability, suggesting right atrial pressure of 3 mmHg.   Comparison(s): Previous echo 08/08/2021 aortic valve area by VTI measures  1.24cm. Aortic valve mean gradient measures 25.9 mmghg.   FINDINGS   Left Ventricle: Left ventricular ejection fraction, by estimation, is 55  to 60%. The left ventricle has normal function. The left ventricle has no  regional wall motion abnormalities. The average left ventricular global  longitudinal strain is -13.4 %.  The global longitudinal strain is abnormal. 3D ejection fraction reviewed  and evaluated as part of the interpretation. Alternate measurement of EF  is felt to be most reflective of LV function. The left ventricular  internal cavity size was normal in size.   There is mild concentric left ventricular hypertrophy. Left ventricular  diastolic parameters are consistent with Grade I diastolic dysfunction  (impaired relaxation).   Right Ventricle: The right ventricular size is normal. No increase in  right ventricular wall thickness. Right ventricular systolic function is  normal.   Left Atrium: Left atrial size was mildly dilated.   Right Atrium: Right atrial size was normal in size.   Pericardium: There is no evidence of pericardial effusion.   Mitral Valve: The mitral valve is normal in structure. Mild to moderate  mitral annular calcification. Trivial mitral valve regurgitation. No  evidence of mitral valve stenosis.   Tricuspid Valve: The tricuspid valve is normal in structure. Tricuspid  valve regurgitation is trivial.   Aortic Valve: AVA 0.61cm2, DI 0.2. The aortic valve is bicuspid. There is  severe calcifcation of the aortic valve. There is severe thickening of the  aortic valve. Aortic valve regurgitation is mild. Aortic regurgitation PHT  measures 351 msec. Severe  aortic stenosis is present. Aortic valve mean gradient measures 42.0 mmHg.  Aortic valve peak  gradient measures 64.0 mmHg. Aortic valve area, by VTI  measures 0.61 cm.   Pulmonic Valve: The pulmonic valve was normal in structure. Pulmonic valve  regurgitation is trivial.   Aorta: The aortic root is normal in size and structure.   Venous: The inferior vena cava is normal in size with greater than 50%  respiratory variability, suggesting right atrial pressure of 3 mmHg.   IAS/Shunts: The atrial septum is grossly normal.  LEFT VENTRICLE  PLAX 2D  LVIDd:         5.08 cm   Diastology  LVIDs:         3.55 cm   LV e' medial:    7.83 cm/s  LV PW:         1.71 cm   LV E/e' medial:  8.4  LV IVS:        1.30 cm   LV e' lateral:   7.40 cm/s  LVOT diam:     2.10 cm   LV E/e' lateral: 8.9  LV SV:         52  LV SV Index:   23        2D Longitudinal Strain  LVOT Area:     3.46 cm  2D Strain GLS Avg:     -13.4 %                             3D Volume EF:                           3D EF:        62 %                           LV EDV:       251 ml                           LV ESV:       96 ml                           LV SV:        155 ml   RIGHT VENTRICLE  RV Basal diam:  3.72 cm  RV Mid diam:    3.42 cm  RV S prime:     14.70 cm/s  TAPSE (M-mode): 1.9 cm   LEFT ATRIUM             Index        RIGHT ATRIUM           Index  LA Vol (A2C):   88.8 ml 40.10 ml/m  RA Area:     17.30 cm  LA Vol (A4C):   66.0 ml 29.80 ml/m  RA Volume:   42.20 ml  19.06 ml/m  LA Biplane Vol: 77.0 ml 34.77 ml/m   AORTIC VALVE  AV Area (Vmax):    0.55 cm  AV Area (Vmean):   0.55 cm  AV Area (VTI):     0.61 cm  AV Vmax:           400.00 cm/s  AV Vmean:          272.000 cm/s  AV VTI:            0.841 m  AV Peak Grad:      64.0 mmHg  AV Mean Grad:      42.0 mmHg  LVOT Vmax:         63.70 cm/s  LVOT Vmean:        43.300 cm/s  LVOT VTI:          0.149 m  LVOT/AV VTI ratio: 0.18  AI PHT:            351  msec    AORTA  Ao Root diam: 3.60 cm  Ao Asc diam:  3.30 cm   MITRAL VALVE                TRICUSPID VALVE  MV Area (PHT): 4.31 cm    TR Peak grad:   11.8 mmHg  MV Decel Time: 176 msec    TR Vmax:        172.00 cm/s  MV E velocity: 65.50 cm/s  MV A velocity: 68.70 cm/s  SHUNTS  MV E/A ratio:  0.95        Systemic VTI:  0.15 m                             Systemic Diam: 2.10 cm   Recent Labs: 09/10/2021: ALT 58 07/21/2022: BUN 19; Creatinine, Ser 0.79; Hemoglobin 14.8; Platelets 158; Potassium 3.9; Sodium 137   Lipid Panel    Component Value Date/Time   CHOL 114 09/10/2021 1132   TRIG 167 (H) 09/10/2021 1132   HDL 34 (L) 09/10/2021 1132   CHOLHDL 3.4 09/10/2021 1132   CHOLHDL 4.6 12/27/2016 0238   VLDL 18 12/27/2016 0238   LDLCALC 52 09/10/2021 1132     Wt Readings from Last 3 Encounters:  09/01/22 98.1 kg  07/21/22 99.8 kg  07/18/22 102 kg     Assessment and Plan:   1. Severe Aortic Valve Stenosis: He has severe stage C1 aortic valve stenosis. He has NYHA class 1 symptoms. I have personally reviewed the echo images. The aortic valve is thickened and calcified with limited leaflet mobility. I think he would benefit from AVR when he becomes symptomatic. He would be a candidate for surgical AVR or TAVR.  At this time, he is recovering from left shoulder surgery. He also needs to get into the dentist.   I have reviewed the natural history of aortic stenosis with the patient and their family members  who are present today. We have discussed the limitations of medical therapy and the poor prognosis associated with symptomatic aortic stenosis. We have reviewed potential treatment options, including palliative medical therapy, conventional surgical aortic valve replacement, and transcatheter aortic valve replacement. We discussed treatment options in the context of the patient's specific comorbid medical conditions.   I will plan to repeat his echo in 6 months and I will see him after the echo. In the meantime, he will call with any change in his clinical status. He will plan a  visit with the dentist now.      Labs/ tests ordered today include:   Orders Placed This Encounter  Procedures   ECHOCARDIOGRAM COMPLETE   Disposition:   F/U with me in 6 months post echo  Signed, Verne Carrow, MD, [facility] 09/01/2022 2:53 PM    [facility] [address] [place], [place], [place]  [address] Phone: [phone]; Fax: [phone]

## 2022-09-02 ENCOUNTER — Encounter (HOSPITAL_BASED_OUTPATIENT_CLINIC_OR_DEPARTMENT_OTHER): Payer: Medicaid Other | Admitting: Physical Therapy

## 2022-09-05 ENCOUNTER — Ambulatory Visit (HOSPITAL_BASED_OUTPATIENT_CLINIC_OR_DEPARTMENT_OTHER): Payer: Medicaid Other | Admitting: Physical Therapy

## 2022-09-05 ENCOUNTER — Encounter (HOSPITAL_BASED_OUTPATIENT_CLINIC_OR_DEPARTMENT_OTHER): Payer: Self-pay | Admitting: Physical Therapy

## 2022-09-05 ENCOUNTER — Ambulatory Visit (INDEPENDENT_AMBULATORY_CARE_PROVIDER_SITE_OTHER): Payer: Medicaid Other | Admitting: Orthopaedic Surgery

## 2022-09-05 DIAGNOSIS — M6281 Muscle weakness (generalized): Secondary | ICD-10-CM | POA: Diagnosis not present

## 2022-09-05 DIAGNOSIS — M25512 Pain in left shoulder: Secondary | ICD-10-CM

## 2022-09-05 DIAGNOSIS — M25612 Stiffness of left shoulder, not elsewhere classified: Secondary | ICD-10-CM

## 2022-09-05 DIAGNOSIS — M75122 Complete rotator cuff tear or rupture of left shoulder, not specified as traumatic: Secondary | ICD-10-CM

## 2022-09-05 NOTE — Progress Notes (Signed)
Post Operative Evaluation    Procedure/Date of Surgery: 07/21/22  Interval History:   Status post left shoulder arthroscopy with rotator cuff repair and biceps tenotomy.  Overall he is doing very well.  His overhead active range of motion is much improved.   PMH/PSH/Family History/Social History/Meds/Allergies:    Past Medical History:  Diagnosis Date   Aortic stenosis    CAD (coronary artery disease), native coronary artery 12/26/2016   DES LAD & RCA, EF 25-30%   CHF (congestive heart failure) (HCC)    Dyslipidemia    Hypertension    Obesity    Osteoarthritis    Pre-diabetes    a. prior A1C 5.6, states he was on meds for this at one point.   Past Surgical History:  Procedure Laterality Date   CORONARY STENT INTERVENTION N/A 12/29/2016   Procedure: CORONARY STENT INTERVENTION;  Surgeon: Corky Crafts, MD;  Location: [facility] INVASIVE CV LAB;  Service: Cardiovascular;  Laterality: N/A;   RIGHT/LEFT HEART CATH AND CORONARY ANGIOGRAPHY N/A 12/29/2016   Procedure: RIGHT/LEFT HEART CATH AND CORONARY ANGIOGRAPHY;  Surgeon: Corky Crafts, MD;  Location: [facility] INVASIVE CV LAB;  Service: Cardiovascular;  Laterality: N/A;   SHOULDER ARTHROSCOPY WITH ROTATOR CUFF REPAIR AND OPEN BICEPS TENODESIS Right 11/28/2021   Procedure: RIGHT SHOULDER ARTHROSCOPY WITH ROTATOR CUFF REPAIR AND OPEN BICEPS TENODESIS;  Surgeon: Huel Cote, MD;  Location: [facility] OR;  Service: Orthopedics;  Laterality: Right;   SHOULDER ARTHROSCOPY WITH ROTATOR CUFF REPAIR AND OPEN BICEPS TENODESIS Left 07/21/2022   Procedure: LEFT SHOULDER ARTHROSCOPY WITH ROTATOR CUFF REPAIR AND BICEPS TENODESIS;  Surgeon: Huel Cote, MD;  Location: [facility] OR;  Service: Orthopedics;  Laterality: Left;   TONSILLECTOMY     TOTAL HIP ARTHROPLASTY Right 04/26/2018   Procedure: RIGHT TOTAL HIP ARTHROPLASTY ANTERIOR APPROACH;  Surgeon: Tarry Kos, MD;  Location: [facility] OR;  Service: Orthopedics;  Laterality:  Right;   TOTAL HIP ARTHROPLASTY Left 11/15/2018   TOTAL HIP ARTHROPLASTY Left 11/15/2018   Procedure: LEFT TOTAL HIP ARTHROPLASTY ANTERIOR APPROACH;  Surgeon: Tarry Kos, MD;  Location: [facility] OR;  Service: Orthopedics;  Laterality: Left;   Social History   Socioeconomic History   Marital status: Single    Spouse name: Not on file   Number of children: 0   Years of education: Not on file   Highest education level: Not on file  Occupational History   Occupation: Retired-Upholstery  Tobacco Use   Smoking status: Former    Packs/day: .5    Types: Cigarettes    Quit date: 08/18/2000    Years since quitting: 22.0    Passive exposure: Past   Smokeless tobacco: Never   Tobacco comments:    Smoked lightly for approx 20 years, quit 1990s  Vaping Use   Vaping Use: Never used  Substance and Sexual Activity   Alcohol use: Yes    Comment: occasionally   Drug use: Yes    Types: Marijuana    Comment: every night (to sleep)   Sexual activity: Not on file  Other Topics Concern   Not on file  Social History Narrative   Lives Home alone. Sister makes all health related decisions. Declined Advanced directive at this time   Social Determinants of Health   Financial Resource Strain: Not on file  Food Insecurity: Not on  file  Transportation Needs: Not on file  Physical Activity: Not on file  Stress: Not on file  Social Connections: Not on file   Family History  Problem Relation Age of Onset   CAD Father        MI/CABG age 25   Allergies  Allergen Reactions   Statins Other (See Comments)    Elevated LFTs   Current Outpatient Medications  Medication Sig Dispense Refill   amLODipine (NORVASC) 5 MG tablet Take 1 tablet (5 mg total) by mouth daily. Please make a PCP appointment for more refills. 30 tablet 0   aspirin EC 325 MG tablet Take 1 tablet (325 mg total) by mouth daily. 30 tablet 0   carvedilol (COREG) 3.125 MG tablet Take 1 tablet (3.125 mg total) by mouth 2 (two) times daily.  180 tablet 2   Evolocumab (REPATHA SURECLICK) 140 MG/ML SOAJ Inject 1 mL into the skin every 14 (fourteen) days. 6 mL 3   fluticasone (FLONASE) 50 MCG/ACT nasal spray PLACE 1 SPRAY INTO BOTH NOSTRILS DAILY AS NEEDED FOR ALLERGIES OR RHINITIS. 48 mL 0   lisinopril (ZESTRIL) 10 MG tablet Take 1 tablet (10 mg total) by mouth 2 (two) times daily. Please make a PCP appointment. 60 tablet 0   loratadine (CLARITIN) 10 MG tablet Take 10 mg by mouth daily as needed for allergies.     methocarbamol (ROBAXIN) 500 MG tablet Take 1 tablet (500 mg total) by mouth 2 (two) times daily as needed for muscle spasms. 20 tablet 2   oxyCODONE (ROXICODONE) 5 MG immediate release tablet Take 1 tablet (5 mg total) by mouth every 4 (four) hours as needed for severe pain or breakthrough pain. 30 tablet 0   potassium chloride (KLOR-CON) 10 MEQ tablet Take 1 tablet (10 mEq total) by mouth daily. Please make PCP appointment. 30 tablet 0   No current facility-administered medications for this visit.   No results found.  Review of Systems:   A ROS was performed including pertinent positives and negatives as documented in the HPI.   Musculoskeletal Exam:    There were no vitals taken for this visit.  Left shoulder incisions are well-appearing without erythema or drainage.  He is able to forward elevate to 150 degrees passively in the sitting position with external rotation at the side to 50 degrees.  Internal rotation is to L1 today.  Distal neurosensory exam is intact  Imaging:      I personally reviewed and interpreted the radiographs.   Assessment:   6 weeks status post left shoulder rotator cuff repair and biceps tenotomy overall doing very well.  At this time he will begin active range of motion.  He will continue to follow the rotator cuff protocol.  I will plan to see him back in 6 weeks for final check  Plan :    -Return to clinic in 6 weeks for reassessment      I personally saw and evaluated the  patient, and participated in the management and treatment plan.  Huel Cote, MD Attending Physician, Orthopedic Surgery  This document was dictated using Dragon voice recognition software. A reasonable attempt at proof reading has been made to minimize errors.

## 2022-09-05 NOTE — Therapy (Signed)
OUTPATIENT PHYSICAL THERAPY SHOULDER EVALUATION   Patient Name: Joshua [name] MRN: [id number] DOB:[birth year], 62 y.o., male Today's Date: 08/19/2022  END OF SESSION:  PT End of Session - 08/19/22 1435     Visit Number 2    Number of Visits 14    Date for PT Re-Evaluation 10/24/22    Authorization Type [employer] MCD    PT Start Time 1400    PT Stop Time 1430    PT Time Calculation (min) 30 min    Activity Tolerance Patient tolerated treatment well    Behavior During Therapy WFL for tasks assessed/performed              Past Medical History:  Diagnosis Date   Aortic stenosis    CAD (coronary artery disease), native coronary artery 12/26/2016   DES LAD & RCA, EF 25-30%   CHF (congestive heart failure) (HCC)    Dyslipidemia    Hypertension    Obesity    Osteoarthritis    Pre-diabetes    a. prior A1C 5.6, states he was on meds for this at one point.   Past Surgical History:  Procedure Laterality Date   CORONARY STENT INTERVENTION N/A 12/29/2016   Procedure: CORONARY STENT INTERVENTION;  Surgeon: Corky Crafts, MD;  Location: [facility] INVASIVE CV LAB;  Service: Cardiovascular;  Laterality: N/A;   RIGHT/LEFT HEART CATH AND CORONARY ANGIOGRAPHY N/A 12/29/2016   Procedure: RIGHT/LEFT HEART CATH AND CORONARY ANGIOGRAPHY;  Surgeon: Corky Crafts, MD;  Location: [facility] INVASIVE CV LAB;  Service: Cardiovascular;  Laterality: N/A;   SHOULDER ARTHROSCOPY WITH ROTATOR CUFF REPAIR AND OPEN BICEPS TENODESIS Right 11/28/2021   Procedure: RIGHT SHOULDER ARTHROSCOPY WITH ROTATOR CUFF REPAIR AND OPEN BICEPS TENODESIS;  Surgeon: Huel Cote, MD;  Location: [facility] OR;  Service: Orthopedics;  Laterality: Right;   SHOULDER ARTHROSCOPY WITH ROTATOR CUFF REPAIR AND OPEN BICEPS TENODESIS Left 07/21/2022   Procedure: LEFT SHOULDER ARTHROSCOPY WITH ROTATOR CUFF REPAIR AND BICEPS TENODESIS;  Surgeon: Huel Cote, MD;  Location: [facility] OR;  Service: Orthopedics;  Laterality: Left;   TONSILLECTOMY      TOTAL HIP ARTHROPLASTY Right 04/26/2018   Procedure: RIGHT TOTAL HIP ARTHROPLASTY ANTERIOR APPROACH;  Surgeon: Tarry Kos, MD;  Location: [facility] OR;  Service: Orthopedics;  Laterality: Right;   TOTAL HIP ARTHROPLASTY Left 11/15/2018   TOTAL HIP ARTHROPLASTY Left 11/15/2018   Procedure: LEFT TOTAL HIP ARTHROPLASTY ANTERIOR APPROACH;  Surgeon: Tarry Kos, MD;  Location: [facility] OR;  Service: Orthopedics;  Laterality: Left;   Patient Active Problem List   Diagnosis Date Noted   Nontraumatic complete tear of left rotator cuff 07/21/2022   Biceps tendinitis of right upper extremity    Rotator cuff syndrome of right shoulder 10/16/2021   Other insomnia 04/30/2021   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   COVID-19 vaccine regimen to maintain immunity completed 04/27/2020   Statin intolerance 12/10/2018   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/15/2018   Chronic left shoulder pain 06/08/2018   Status post total hip replacement, right 04/26/2018   Mild dilation of ascending aorta (HCC) 12/26/2017   Dyslipidemia 12/26/2017   Bilateral carotid bruits 12/26/2017   Nocturnal hypoxemia 06/29/2017   Primary osteoarthritis of both hips 06/29/2017   Chronic diastolic heart failure (HCC)    Coronary artery disease involving native coronary artery of native heart without angina pectoris 03/31/2017   Ischemic cardiomyopathy 03/31/2017   Marijuana use 03/31/2017   Prediabetes 03/31/2017   Aortic valve insufficiency 12/28/2016  Mitral valve regurgitation 12/28/2016   Obesity 12/28/2016   Aortic valve stenosis, nonrheumatic    Essential hypertension      REFERRING PROVIDER: Steward Drone, MD  REFERRING DIAG: [phone] (ICD-10-CM) - Nontraumatic complete tear of left rotator cuff  S/p Lt RCR, biceps tenodesis  THERAPY DIAG:  Muscle weakness (generalized)  Stiffness of left shoulder, not elsewhere classified  Acute pain of left shoulder  Rationale for Evaluation and Treatment:  Rehabilitation  ONSET DATE: DOS 07/21/22  Days since surgery: 29   SUBJECTIVE:                                                                                                                                                                                      SUBJECTIVE STATEMENT: The patient has seen the MD. He has been taken out of the sling. He reports overall it is feeling better.  Hand dominance: Right  PERTINENT HISTORY: Chronic shoulder pain, osteoarthritis, CHF  PAIN:  Are you having pain? Yes: NPRS scale: 1-2/10 Pain location: Lt shoulder Pain description: ache Aggravating factors: constant Relieving factors: ice  PRECAUTIONS: Shoulder  WEIGHT BEARING RESTRICTIONS:  POSTOPERATIVE PLAN: He will be nonweightbearing in a sling at all seen by physical therapy at which point we will begin passive range of motion.   FALLS:  Has patient fallen in last 6 months? No  LIVING ENVIRONMENT: Lives with: lives alone  OCCUPATION: retired  PLOF: Independent  PATIENT GOALS: reach the top shelf   OBJECTIVE:   PATIENT SURVEYS:  UEFS: 0  COGNITION: Overall cognitive status: Within functional limits for tasks assessed     SENSATION: WFL  POSTURE: EVAL: forward rounded shoulder as expected in sling  UPPER EXTREMITY ROM:   Passive ROM Left eval  Shoulder flexion 90  Shoulder extension   Shoulder abduction   Shoulder adduction   Shoulder internal rotation   Shoulder external rotation   Elbow flexion   Elbow extension   (Blank rows = not tested)  UPPER EXTREMITY MMT:  MMT    Shoulder flexion    Shoulder extension    Shoulder abduction    Shoulder adduction    Shoulder internal rotation    Shoulder external rotation    Middle trapezius    Lower trapezius    Elbow flexion    Elbow extension    Wrist flexion    Wrist extension    Wrist ulnar deviation    Wrist radial deviation    Wrist pronation    Wrist supination    Grip strength (lbs)    (Blank  rows = not tested)     TODAY'S TREATMENT:    5/24 Manual: PROM into all  planes; trigger point release to upper trap\   08/19/22  PROM to pt tolerance: 145 flexion, 90 ABD, IR 50, ER 35  Exercises - Seated Scapular Retraction  - 5 x daily - 7 x weekly - 1 sets - 10 reps - Seated Elbow Flexion AAROM  - 3 x daily - 7 x weekly - 2 sets - 10 reps - Seated Shoulder Flexion AAROM with Pulley Behind  - 2 x daily - 7 x weekly - 1 sets - 1 reps - 2 min hold - Seated Shoulder Scaption AAROM with Pulley at Side  - 2 x daily - 7 x weekly - 1 sets - 1 reps - 2 min hold - Seated Shoulder Abduction AAROM with Pulley Behind  - 2 x daily - 7 x weekly - 1 sets - 1 reps - 2 min hold                                                                                                                                       Treatment                            EVAL 07/24/22:  Bandage changes- no s/s of infection Pendulum Discussed putty grip Scapular retraction Assisted elbow flexion/ext   PATIENT EDUCATION: Education details: Anatomy of condition, POC, HEP, exercise form/rationale, protocol limits Person educated: Patient Education method: Explanation, Demonstration, Tactile cues, Verbal cues, and Handouts Education comprehension: verbalized understanding, returned demonstration, verbal cues required, tactile cues required, and needs further education  HOME EXERCISE PROGRAM:  Access Code: [email or web address] URL: [email or web address] Date: 08/19/2022 Prepared by: Zebedee Iba  ASSESSMENT:  CLINICAL IMPRESSION: The patient is making excellent progress. He had full ROM after manual therapy. We were able to add light resistance to his scpaular exercises. He is still having some weakness on the left side as well so we did his exercises bilateral. We updated his HEP.   OBJECTIVE IMPAIRMENTS: decreased activity tolerance, decreased mobility, decreased ROM, decreased strength, increased muscle spasms,  impaired flexibility, impaired UE functional use, improper body mechanics, postural dysfunction, and pain.   ACTIVITY LIMITATIONS: carrying, lifting, sleeping, bathing, dressing, reach over head, and hygiene/grooming  PARTICIPATION LIMITATIONS: meal prep, cleaning, laundry, driving, shopping, community activity, and yard work  PERSONAL FACTORS: Time since onset of injury/illness/exacerbation and OA, CHF  are also affecting patient's functional outcome.   REHAB POTENTIAL: Good  CLINICAL DECISION MAKING: Stable/uncomplicated  EVALUATION COMPLEXITY: Low   GOALS: Goals reviewed with patient? Yes  SHORT TERM GOALS: Target date: 5/3  Passive flexion to 140 deg without increase in pain Baseline:90 Goal status: INITIAL  2.  Passive abd to 60 deg without increase in pain Baseline: 30 Goal status: INITIAL  3.  Passive ER at side to 40 deg without increase in pain Baseline: 0 Goal status: INITIAL  4.  Able to demo proper scapular  retraction for proximal shoulder girdle support Baseline: began educating at eval Goal status: INITIAL   LONG TERM GOALS: Target date: 10/24/22  Able to demo AROM to shoulder height, against gravity, with good scapular control Baseline: unable at eval Goal status: INITIAL  Target date: 09/18/2022  8 weeks  2.  Will begin light resistance exercises pain <=3/10 Baseline: unable at eval Goal status: INITIAL Target date: 09/18/2022  8 weeks  3.  Full AROM within 10 deg of opp UE without increase in pain Baseline:  Goal status: unableINITIAL Target date: 10/16/2022  12 weeks  4.  Proper scapular control in proprioceptive and CKC strengthening Baseline: unable at eval Goal status: INITIAL Target date: 11/11/2022  12 weeks  5.  Able to lift cups/plates and other small objects into overhead cabinets without limitation by shoulder Baseline: unable Goal status: INITIAL Target date: 11/11/2022  12 weeks   PLAN:  PT FREQUENCY: 1-2x/week  PT DURATION:  other: 13 weeks  PLANNED INTERVENTIONS: Therapeutic exercises, Therapeutic activity, Neuromuscular re-education, Patient/Family education, Self Care, Joint mobilization, Dry Needling, Electrical stimulation, Spinal mobilization, Cryotherapy, Moist heat, scar mobilization, Taping, Traction, Manual therapy, and Re-evaluation  PLAN FOR NEXT SESSION: continue per protocol  Lorayne Bender PT DPT  08/19/22 2:39 PM   Check all possible CPT codes: 16109 - PT Re-evaluation, 97110- Therapeutic Exercise, 97112- Neuro Re-education, 97140 - Manual Therapy, 97530 - Therapeutic Activities, 97535 - Self Care, 97014 - Electrical stimulation (unattended), and [id number] - Aquatic therapy    Check all conditions that are expected to impact treatment: None of these apply   If treatment provided at initial evaluation, no treatment charged due to lack of authorization.

## 2022-09-05 NOTE — Therapy (Deleted)
OUTPATIENT PHYSICAL THERAPY SHOULDER EVALUATION   Patient Name: Joshua [name] MRN: [id number] DOB:07/09/1960, 62 y.o., male Today's Date: 08/19/2022  END OF SESSION:  PT End of Session - 08/19/22 1435     Visit Number 2    Number of Visits 14    Date for PT Re-Evaluation 10/24/22    Authorization Type [employer] MCD    PT Start Time 1400    PT Stop Time 1430    PT Time Calculation (min) 30 min    Activity Tolerance Patient tolerated treatment well    Behavior During Therapy WFL for tasks assessed/performed              Past Medical History:  Diagnosis Date   Aortic stenosis    CAD (coronary artery disease), native coronary artery 12/26/2016   DES LAD & RCA, EF 25-30%   CHF (congestive heart failure) (HCC)    Dyslipidemia    Hypertension    Obesity    Osteoarthritis    Pre-diabetes    a. prior A1C 5.6, states he was on meds for this at one point.   Past Surgical History:  Procedure Laterality Date   CORONARY STENT INTERVENTION N/A 12/29/2016   Procedure: CORONARY STENT INTERVENTION;  Surgeon: Varanasi, Jayadeep S, MD;  Location: [facility] INVASIVE CV LAB;  Service: Cardiovascular;  Laterality: N/A;   RIGHT/LEFT HEART CATH AND CORONARY ANGIOGRAPHY N/A 12/29/2016   Procedure: RIGHT/LEFT HEART CATH AND CORONARY ANGIOGRAPHY;  Surgeon: Varanasi, Jayadeep S, MD;  Location: [facility] INVASIVE CV LAB;  Service: Cardiovascular;  Laterality: N/A;   SHOULDER ARTHROSCOPY WITH ROTATOR CUFF REPAIR AND OPEN BICEPS TENODESIS Right 11/28/2021   Procedure: RIGHT SHOULDER ARTHROSCOPY WITH ROTATOR CUFF REPAIR AND OPEN BICEPS TENODESIS;  Surgeon: Bokshan, Steven, MD;  Location: [facility] OR;  Service: Orthopedics;  Laterality: Right;   SHOULDER ARTHROSCOPY WITH ROTATOR CUFF REPAIR AND OPEN BICEPS TENODESIS Left 07/21/2022   Procedure: LEFT SHOULDER ARTHROSCOPY WITH ROTATOR CUFF REPAIR AND BICEPS TENODESIS;  Surgeon: Bokshan, Steven, MD;  Location: [facility] OR;  Service: Orthopedics;  Laterality: Left;   TONSILLECTOMY      TOTAL HIP ARTHROPLASTY Right 04/26/2018   Procedure: RIGHT TOTAL HIP ARTHROPLASTY ANTERIOR APPROACH;  Surgeon: Xu, Naiping M, MD;  Location: [facility] OR;  Service: Orthopedics;  Laterality: Right;   TOTAL HIP ARTHROPLASTY Left 11/15/2018   TOTAL HIP ARTHROPLASTY Left 11/15/2018   Procedure: LEFT TOTAL HIP ARTHROPLASTY ANTERIOR APPROACH;  Surgeon: Xu, Naiping M, MD;  Location: [facility] OR;  Service: Orthopedics;  Laterality: Left;   Patient Active Problem List   Diagnosis Date Noted   Nontraumatic complete tear of left rotator cuff 07/21/2022   Biceps tendinitis of right upper extremity    Rotator cuff syndrome of right shoulder 10/16/2021   Other insomnia 04/30/2021   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   COVID-19 vaccine regimen to maintain immunity completed 04/27/2020   Statin intolerance 12/10/2018   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/15/2018   Chronic left shoulder pain 06/08/2018   Status post total hip replacement, right 04/26/2018   Mild dilation of ascending aorta (HCC) 12/26/2017   Dyslipidemia 12/26/2017   Bilateral carotid bruits 12/26/2017   Nocturnal hypoxemia 06/29/2017   Primary osteoarthritis of both hips 06/29/2017   Chronic diastolic heart failure (HCC)    Coronary artery disease involving native coronary artery of native heart without angina pectoris 03/31/2017   Ischemic cardiomyopathy 03/31/2017   Marijuana use 03/31/2017   Prediabetes 03/31/2017   Aortic valve insufficiency 12/28/2016     Mitral valve regurgitation 12/28/2016   Obesity 12/28/2016   Aortic valve stenosis, nonrheumatic    Essential hypertension      REFERRING PROVIDER: Bokshan, MD  REFERRING DIAG: M75.122 (ICD-10-CM) - Nontraumatic complete tear of left rotator cuff  S/p Lt RCR, biceps tenodesis  THERAPY DIAG:  Muscle weakness (generalized)  Stiffness of left shoulder, not elsewhere classified  Acute pain of left shoulder  Rationale for Evaluation and Treatment:  Rehabilitation  ONSET DATE: DOS 07/21/22  Days since surgery: 29   SUBJECTIVE:                                                                                                                                                                                      SUBJECTIVE STATEMENT: Doing ok overall, the left shoulder hurts.   Hand dominance: Right  PERTINENT HISTORY: Chronic shoulder pain, osteoarthritis, CHF  PAIN:  Are you having pain? Yes: NPRS scale: 6/10 Pain location: Lt shoulder Pain description: ache Aggravating factors: constant Relieving factors: ice  PRECAUTIONS: Shoulder  WEIGHT BEARING RESTRICTIONS:  POSTOPERATIVE PLAN: He will be nonweightbearing in a sling at all seen by physical therapy at which point we will begin passive range of motion.   FALLS:  Has patient fallen in last 6 months? No  LIVING ENVIRONMENT: Lives with: lives alone  OCCUPATION: retired  PLOF: Independent  PATIENT GOALS: reach the top shelf   OBJECTIVE:   PATIENT SURVEYS:  UEFS: 0  COGNITION: Overall cognitive status: Within functional limits for tasks assessed     SENSATION: WFL  POSTURE: EVAL: forward rounded shoulder as expected in sling  UPPER EXTREMITY ROM:   Passive ROM Left eval  Shoulder flexion 90  Shoulder extension   Shoulder abduction   Shoulder adduction   Shoulder internal rotation   Shoulder external rotation   Elbow flexion   Elbow extension   (Blank rows = not tested)  UPPER EXTREMITY MMT:  MMT    Shoulder flexion    Shoulder extension    Shoulder abduction    Shoulder adduction    Shoulder internal rotation    Shoulder external rotation    Middle trapezius    Lower trapezius    Elbow flexion    Elbow extension    Wrist flexion    Wrist extension    Wrist ulnar deviation    Wrist radial deviation    Wrist pronation    Wrist supination    Grip strength (lbs)    (Blank rows = not tested)     TODAY'S TREATMENT:      08/19/22  PROM to pt tolerance: 145 flexion, 90 ABD, IR 50, ER 35  Exercises -   Seated Scapular Retraction  - 5 x daily - 7 x weekly - 1 sets - 10 reps - Seated Elbow Flexion AAROM  - 3 x daily - 7 x weekly - 2 sets - 10 reps - Seated Shoulder Flexion AAROM with Pulley Behind  - 2 x daily - 7 x weekly - 1 sets - 1 reps - 2 min hold - Seated Shoulder Scaption AAROM with Pulley at Side  - 2 x daily - 7 x weekly - 1 sets - 1 reps - 2 min hold - Seated Shoulder Abduction AAROM with Pulley Behind  - 2 x daily - 7 x weekly - 1 sets - 1 reps - 2 min hold                                                                                                                                       Treatment                            EVAL 07/24/22:  Bandage changes- no s/s of infection Pendulum Discussed putty grip Scapular retraction Assisted elbow flexion/ext   PATIENT EDUCATION: Education details: Anatomy of condition, POC, HEP, exercise form/rationale, protocol limits Person educated: Patient Education method: Explanation, Demonstration, Tactile cues, Verbal cues, and Handouts Education comprehension: verbalized understanding, returned demonstration, verbal cues required, tactile cues required, and needs further education  HOME EXERCISE PROGRAM:  Access Code: [email or web address] URL: [email or web address] Date: 08/19/2022 Prepared by: Alan Zhou  ASSESSMENT:  CLINICAL IMPRESSION: Pt returns to PT 4 wks post-op. Pt does have good ROM of the L shoulder without pain. Pt able to progress OH reaching motions today to protocol limits at 4+ wks phase with good tolerance. AAROM introduced for home. Plan to continue per protocol at next. HEP streamlined due to history of pt compliance. Pt would benefit from continued skilled therapy in order to reach goals and maximize functional L UE strength and ROM for full return to PLOF.   OBJECTIVE IMPAIRMENTS: decreased activity tolerance, decreased mobility,  decreased ROM, decreased strength, increased muscle spasms, impaired flexibility, impaired UE functional use, improper body mechanics, postural dysfunction, and pain.   ACTIVITY LIMITATIONS: carrying, lifting, sleeping, bathing, dressing, reach over head, and hygiene/grooming  PARTICIPATION LIMITATIONS: meal prep, cleaning, laundry, driving, shopping, community activity, and yard work  PERSONAL FACTORS: Time since onset of injury/illness/exacerbation and OA, CHF  are also affecting patient's functional outcome.   REHAB POTENTIAL: Good  CLINICAL DECISION MAKING: Stable/uncomplicated  EVALUATION COMPLEXITY: Low   GOALS: Goals reviewed with patient? Yes  SHORT TERM GOALS: Target date: 5/3  Passive flexion to 140 deg without increase in pain Baseline:90 Goal status: INITIAL  2.  Passive abd to 60 deg without increase in pain Baseline: 30 Goal status: INITIAL  3.  Passive ER at side to 40 deg without increase in pain Baseline: 0 Goal status: INITIAL    4.  Able to demo proper scapular retraction for proximal shoulder girdle support Baseline: began educating at eval Goal status: INITIAL   LONG TERM GOALS: Target date: 10/24/22  Able to demo AROM to shoulder height, against gravity, with good scapular control Baseline: unable at eval Goal status: INITIAL  Target date: 09/18/2022  8 weeks  2.  Will begin light resistance exercises pain <=3/10 Baseline: unable at eval Goal status: INITIAL Target date: 09/18/2022  8 weeks  3.  Full AROM within 10 deg of opp UE without increase in pain Baseline:  Goal status: unableINITIAL Target date: 10/16/2022  12 weeks  4.  Proper scapular control in proprioceptive and CKC strengthening Baseline: unable at eval Goal status: INITIAL Target date: 11/11/2022  12 weeks  5.  Able to lift cups/plates and other small objects into overhead cabinets without limitation by shoulder Baseline: unable Goal status: INITIAL Target date: 11/11/2022  12  weeks   PLAN:  PT FREQUENCY: 1-2x/week  PT DURATION: other: 13 weeks  PLANNED INTERVENTIONS: Therapeutic exercises, Therapeutic activity, Neuromuscular re-education, Patient/Family education, Self Care, Joint mobilization, Dry Needling, Electrical stimulation, Spinal mobilization, Cryotherapy, Moist heat, scar mobilization, Taping, Traction, Manual therapy, and Re-evaluation  PLAN FOR NEXT SESSION: continue per protocol  Alan Zhou PT, DPT 08/19/22 2:39 PM   Check all possible CPT codes: 97164 - PT Re-evaluation, 97110- Therapeutic Exercise, 97112- Neuro Re-education, 97140 - Manual Therapy, 97530 - Therapeutic Activities, 97535 - Self Care, 97014 - Electrical stimulation (unattended), and 97113 - Aquatic therapy    Check all conditions that are expected to impact treatment: None of these apply   If treatment provided at initial evaluation, no treatment charged due to lack of authorization.      

## 2022-09-09 ENCOUNTER — Ambulatory Visit (HOSPITAL_BASED_OUTPATIENT_CLINIC_OR_DEPARTMENT_OTHER): Payer: Medicaid Other | Admitting: Physical Therapy

## 2022-09-16 ENCOUNTER — Encounter (HOSPITAL_BASED_OUTPATIENT_CLINIC_OR_DEPARTMENT_OTHER): Payer: Self-pay | Admitting: Physical Therapy

## 2022-09-16 ENCOUNTER — Ambulatory Visit (HOSPITAL_BASED_OUTPATIENT_CLINIC_OR_DEPARTMENT_OTHER): Payer: Medicaid Other | Attending: Orthopaedic Surgery | Admitting: Physical Therapy

## 2022-09-16 DIAGNOSIS — M6281 Muscle weakness (generalized): Secondary | ICD-10-CM | POA: Insufficient documentation

## 2022-09-16 DIAGNOSIS — M25512 Pain in left shoulder: Secondary | ICD-10-CM | POA: Insufficient documentation

## 2022-09-16 DIAGNOSIS — M25612 Stiffness of left shoulder, not elsewhere classified: Secondary | ICD-10-CM | POA: Insufficient documentation

## 2022-09-16 NOTE — Therapy (Signed)
OUTPATIENT PHYSICAL THERAPY SHOULDER EVALUATION   Patient Name: Joshua [name] MRN: [id number] DOB:09-Oct-1960, 62 y.o., male Today's Date: 09/16/2022  END OF SESSION:  PT End of Session - 09/16/22 1325     Visit Number 4    Number of Visits 14    Date for PT Re-Evaluation 10/24/22    Authorization Type [employer] MCD    PT Start Time 1323    PT Stop Time 1355    PT Time Calculation (min) 32 min    Activity Tolerance Patient tolerated treatment well    Behavior During Therapy WFL for tasks assessed/performed              Past Medical History:  Diagnosis Date   Aortic stenosis    CAD (coronary artery disease), native coronary artery 12/26/2016   DES LAD & RCA, EF 25-30%   CHF (congestive heart failure) (HCC)    Dyslipidemia    Hypertension    Obesity    Osteoarthritis    Pre-diabetes    a. prior A1C 5.6, states he was on meds for this at one point.   Past Surgical History:  Procedure Laterality Date   CORONARY STENT INTERVENTION N/A 12/29/2016   Procedure: CORONARY STENT INTERVENTION;  Surgeon: Corky Crafts, MD;  Location: [facility] INVASIVE CV LAB;  Service: Cardiovascular;  Laterality: N/A;   RIGHT/LEFT HEART CATH AND CORONARY ANGIOGRAPHY N/A 12/29/2016   Procedure: RIGHT/LEFT HEART CATH AND CORONARY ANGIOGRAPHY;  Surgeon: Corky Crafts, MD;  Location: [facility] INVASIVE CV LAB;  Service: Cardiovascular;  Laterality: N/A;   SHOULDER ARTHROSCOPY WITH ROTATOR CUFF REPAIR AND OPEN BICEPS TENODESIS Right 11/28/2021   Procedure: RIGHT SHOULDER ARTHROSCOPY WITH ROTATOR CUFF REPAIR AND OPEN BICEPS TENODESIS;  Surgeon: Huel Cote, MD;  Location: [facility] OR;  Service: Orthopedics;  Laterality: Right;   SHOULDER ARTHROSCOPY WITH ROTATOR CUFF REPAIR AND OPEN BICEPS TENODESIS Left 07/21/2022   Procedure: LEFT SHOULDER ARTHROSCOPY WITH ROTATOR CUFF REPAIR AND BICEPS TENODESIS;  Surgeon: Huel Cote, MD;  Location: [facility] OR;  Service: Orthopedics;  Laterality: Left;   TONSILLECTOMY      TOTAL HIP ARTHROPLASTY Right 04/26/2018   Procedure: RIGHT TOTAL HIP ARTHROPLASTY ANTERIOR APPROACH;  Surgeon: Tarry Kos, MD;  Location: [facility] OR;  Service: Orthopedics;  Laterality: Right;   TOTAL HIP ARTHROPLASTY Left 11/15/2018   TOTAL HIP ARTHROPLASTY Left 11/15/2018   Procedure: LEFT TOTAL HIP ARTHROPLASTY ANTERIOR APPROACH;  Surgeon: Tarry Kos, MD;  Location: [facility] OR;  Service: Orthopedics;  Laterality: Left;   Patient Active Problem List   Diagnosis Date Noted   Nontraumatic complete tear of left rotator cuff 07/21/2022   Biceps tendinitis of right upper extremity    Rotator cuff syndrome of right shoulder 10/16/2021   Other insomnia 04/30/2021   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   COVID-19 vaccine regimen to maintain immunity completed 04/27/2020   Statin intolerance 12/10/2018   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/15/2018   Chronic left shoulder pain 06/08/2018   Status post total hip replacement, right 04/26/2018   Mild dilation of ascending aorta (HCC) 12/26/2017   Dyslipidemia 12/26/2017   Bilateral carotid bruits 12/26/2017   Nocturnal hypoxemia 06/29/2017   Primary osteoarthritis of both hips 06/29/2017   Chronic diastolic heart failure (HCC)    Coronary artery disease involving native coronary artery of native heart without angina pectoris 03/31/2017   Ischemic cardiomyopathy 03/31/2017   Marijuana use 03/31/2017   Prediabetes 03/31/2017   Aortic valve insufficiency 12/28/2016  Mitral valve regurgitation 12/28/2016   Obesity 12/28/2016   Aortic valve stenosis, nonrheumatic    Essential hypertension      REFERRING PROVIDER: Steward Drone, MD  REFERRING DIAG: [phone] (ICD-10-CM) - Nontraumatic complete tear of left rotator cuff  S/p Lt RCR, biceps tenodesis  THERAPY DIAG:  Muscle weakness (generalized)  Stiffness of left shoulder, not elsewhere classified  Acute pain of left shoulder  Rationale for Evaluation and Treatment:  Rehabilitation  ONSET DATE: DOS 07/21/22  Days since surgery: 57   SUBJECTIVE:                                                                                                                                                                                      SUBJECTIVE STATEMENT: The patient has seen the MD. He has been taken out of the sling. He reports overall it is feeling better.  Hand dominance: Right  PERTINENT HISTORY: Chronic shoulder pain, osteoarthritis, CHF  PAIN:  Are you having pain? Yes: NPRS scale: 1-2/10 Pain location: Lt shoulder Pain description: ache Aggravating factors: constant Relieving factors: ice  PRECAUTIONS: Shoulder  WEIGHT BEARING RESTRICTIONS:  POSTOPERATIVE PLAN: He will be nonweightbearing in a sling at all seen by physical therapy at which point we will begin passive range of motion.   FALLS:  Has patient fallen in last 6 months? No  LIVING ENVIRONMENT: Lives with: lives alone  OCCUPATION: retired  PLOF: Independent  PATIENT GOALS: reach the top shelf   OBJECTIVE:   PATIENT SURVEYS:  UEFS: 0  COGNITION: Overall cognitive status: Within functional limits for tasks assessed     SENSATION: WFL  POSTURE: EVAL: forward rounded shoulder as expected in sling  UPPER EXTREMITY ROM:   Passive ROM Left eval  Shoulder flexion 90  Shoulder extension   Shoulder abduction   Shoulder adduction   Shoulder internal rotation   Shoulder external rotation   Elbow flexion   Elbow extension   (Blank rows = not tested)  UPPER EXTREMITY MMT:  MMT    Shoulder flexion    Shoulder extension    Shoulder abduction    Shoulder adduction    Shoulder internal rotation    Shoulder external rotation    Middle trapezius    Lower trapezius    Elbow flexion    Elbow extension    Wrist flexion    Wrist extension    Wrist ulnar deviation    Wrist radial deviation    Wrist pronation    Wrist supination    Grip strength (lbs)    (Blank  rows = not tested)     TODAY'S TREATMENT:    6/4 Pulley's 2 min- ABD  and flexion Supine flexion 2x10 1lb Biceps curls 2lbs 3x10 S/L ER 3x10 GTB row/ext 20x GTB IR 20x UBE 3 min retro and fwd  5/24 Manual: PROM into all planes; trigger point release to upper trap\   08/19/22  PROM to pt tolerance: 145 flexion, 90 ABD, IR 50, ER 35  Exercises - Seated Scapular Retraction  - 5 x daily - 7 x weekly - 1 sets - 10 reps - Seated Elbow Flexion AAROM  - 3 x daily - 7 x weekly - 2 sets - 10 reps - Seated Shoulder Flexion AAROM with Pulley Behind  - 2 x daily - 7 x weekly - 1 sets - 1 reps - 2 min hold - Seated Shoulder Scaption AAROM with Pulley at Side  - 2 x daily - 7 x weekly - 1 sets - 1 reps - 2 min hold - Seated Shoulder Abduction AAROM with Pulley Behind  - 2 x daily - 7 x weekly - 1 sets - 1 reps - 2 min hold                                                                                                                                       Treatment                            EVAL 07/24/22:  Bandage changes- no s/s of infection Pendulum Discussed putty grip Scapular retraction Assisted elbow flexion/ext   PATIENT EDUCATION: Education details: Anatomy of condition, POC, HEP, exercise form/rationale, protocol limits Person educated: Patient Education method: Explanation, Demonstration, Tactile cues, Verbal cues, and Handouts Education comprehension: verbalized understanding, returned demonstration, verbal cues required, tactile cues required, and needs further education  HOME EXERCISE PROGRAM:  Access Code: [email or web address] URL: [email or web address] Date: 08/19/2022 Prepared by: Zebedee Iba  ASSESSMENT:  CLINICAL IMPRESSION: Pt session limited due to arrival time. However, pt able to progress strengthening at this time for direct cuff strengthening. Pt with very good AROM OH with near symmetrical reach. Pt does have weakness with long lever position. HEP updated  accordingly. Pt advised strongly against heavy lifting and OH lifting at this time with gardening/yardwork and to slowly progress L shoulder loading. Plan to continue with strength and consider CKC movements at next. Pt would benefit from continued skilled therapy in order to reach goals and maximize functional L UE strength and ROM for return to PLOF.   OBJECTIVE IMPAIRMENTS: decreased activity tolerance, decreased mobility, decreased ROM, decreased strength, increased muscle spasms, impaired flexibility, impaired UE functional use, improper body mechanics, postural dysfunction, and pain.   ACTIVITY LIMITATIONS: carrying, lifting, sleeping, bathing, dressing, reach over head, and hygiene/grooming  PARTICIPATION LIMITATIONS: meal prep, cleaning, laundry, driving, shopping, community activity, and yard work  PERSONAL FACTORS: Time since onset of injury/illness/exacerbation and OA, CHF  are also affecting patient's functional outcome.   REHAB POTENTIAL: Good  CLINICAL DECISION MAKING: Stable/uncomplicated  EVALUATION COMPLEXITY: Low   GOALS: Goals reviewed with patient? Yes  SHORT TERM GOALS: Target date: 5/3  Passive flexion to 140 deg without increase in pain Baseline:90 Goal status: MET  2.  Passive abd to 60 deg without increase in pain Baseline: 30 Goal status: MET  3.  Passive ER at side to 40 deg without increase in pain Baseline: 0 Goal status: MET  4.  Able to demo proper scapular retraction for proximal shoulder girdle support Baseline: began educating at eval Goal status: MET   LONG TERM GOALS: Target date: 10/24/22  Able to demo AROM to shoulder height, against gravity, with good scapular control Baseline: unable at eval Goal status: MET  Target date: 09/18/2022  8 weeks  2.  Will begin light resistance exercises pain <=3/10 Baseline: unable at eval Goal status: MET Target date: 09/18/2022  8 weeks  3.  Full AROM within 10 deg of opp UE without increase in  pain Baseline:  Goal status: ongoing Target date: 10/16/2022  12 weeks  4.  Proper scapular control in proprioceptive and CKC strengthening Baseline: unable at eval Goal status: INITIAL Target date: 12/09/2022  12 weeks  5.  Able to lift cups/plates and other small objects into overhead cabinets without limitation by shoulder Baseline: unable Goal status: INITIAL Target date: 12/09/2022  12 weeks   PLAN:  PT FREQUENCY: 1-2x/week  PT DURATION: other: 13 weeks  PLANNED INTERVENTIONS: Therapeutic exercises, Therapeutic activity, Neuromuscular re-education, Patient/Family education, Self Care, Joint mobilization, Dry Needling, Electrical stimulation, Spinal mobilization, Cryotherapy, Moist heat, scar mobilization, Taping, Traction, Manual therapy, and Re-evaluation  PLAN FOR NEXT SESSION: continue per protocol  Zebedee Iba PT, DPT 09/16/22 1:58 PM'  Check all possible CPT codes: 16109 - PT Re-evaluation, 97110- Therapeutic Exercise, [phone]- Neuro Re-education, 97140 - Manual Therapy, 97530 - Therapeutic Activities, 97535 - Self Care, 97014 - Electrical stimulation (unattended), and [id number] - Aquatic therapy    Check all conditions that are expected to impact treatment: None of these apply   If treatment provided at initial evaluation, no treatment charged due to lack of authorization.

## 2022-09-17 ENCOUNTER — Ambulatory Visit
Admission: EM | Admit: 2022-09-17 | Discharge: 2022-09-17 | Disposition: A | Discharge status: Home / Self Care | Payer: Medicaid Other | Attending: Physician Assistant | Admitting: Physician Assistant

## 2022-09-17 DIAGNOSIS — B86 Scabies: Secondary | ICD-10-CM

## 2022-09-17 MED ORDER — PERMETHRIN 5 % EX CREA: 1.0000 | TOPICAL_CREAM | Freq: Once | CUTANEOUS | 1 refills | Status: AC

## 2022-09-17 NOTE — ED Triage Notes (Signed)
Pt states rash to his arms and legs for 2 days.  States he thinks it might be some kind of bug bite since he has been working in the yard.

## 2022-09-17 NOTE — ED Provider Notes (Signed)
[facility]    CSN: [id number] Arrival date & time: 09/17/22  1502      History   Chief Complaint Chief Complaint  Patient presents with   Rash    HPI Joshua [name] is a 62 y.o. male.   Pt complains of a rash and itching.  Pt feels like something is in his skin   The history is provided by the patient. No language interpreter was used.  Rash Location:  Full body Severity:  Moderate Onset quality:  Gradual Timing:  Constant Chronicity:  New   Past Medical History:  Diagnosis Date   Aortic stenosis    CAD (coronary artery disease), native coronary artery 12/26/2016   DES LAD & RCA, EF 25-30%   CHF (congestive heart failure) (HCC)    Dyslipidemia    Hypertension    Obesity    Osteoarthritis    Pre-diabetes    a. prior A1C 5.6, states he was on meds for this at one point.    Patient Active Problem List   Diagnosis Date Noted   Nontraumatic complete tear of left rotator cuff 07/21/2022   Biceps tendinitis of right upper extremity    Rotator cuff syndrome of right shoulder 10/16/2021   Other insomnia 04/30/2021   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   COVID-19 vaccine regimen to maintain immunity completed 04/27/2020   Statin intolerance 12/10/2018   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/15/2018   Chronic left shoulder pain 06/08/2018   Status post total hip replacement, right 04/26/2018   Mild dilation of ascending aorta (HCC) 12/26/2017   Dyslipidemia 12/26/2017   Bilateral carotid bruits 12/26/2017   Nocturnal hypoxemia 06/29/2017   Primary osteoarthritis of both hips 06/29/2017   Chronic diastolic heart failure (HCC)    Coronary artery disease involving native coronary artery of native heart without angina pectoris 03/31/2017   Ischemic cardiomyopathy 03/31/2017   Marijuana use 03/31/2017   Prediabetes 03/31/2017   Aortic valve insufficiency 12/28/2016   Mitral valve regurgitation 12/28/2016   Obesity 12/28/2016    Aortic valve stenosis, nonrheumatic    Essential hypertension     Past Surgical History:  Procedure Laterality Date   CORONARY STENT INTERVENTION N/A 12/29/2016   Procedure: CORONARY STENT INTERVENTION;  Surgeon: Corky Crafts, MD;  Location: [facility] INVASIVE CV LAB;  Service: Cardiovascular;  Laterality: N/A;   RIGHT/LEFT HEART CATH AND CORONARY ANGIOGRAPHY N/A 12/29/2016   Procedure: RIGHT/LEFT HEART CATH AND CORONARY ANGIOGRAPHY;  Surgeon: Corky Crafts, MD;  Location: [facility] INVASIVE CV LAB;  Service: Cardiovascular;  Laterality: N/A;   SHOULDER ARTHROSCOPY WITH ROTATOR CUFF REPAIR AND OPEN BICEPS TENODESIS Right 11/28/2021   Procedure: RIGHT SHOULDER ARTHROSCOPY WITH ROTATOR CUFF REPAIR AND OPEN BICEPS TENODESIS;  Surgeon: Huel Cote, MD;  Location: [facility] OR;  Service: Orthopedics;  Laterality: Right;   SHOULDER ARTHROSCOPY WITH ROTATOR CUFF REPAIR AND OPEN BICEPS TENODESIS Left 07/21/2022   Procedure: LEFT SHOULDER ARTHROSCOPY WITH ROTATOR CUFF REPAIR AND BICEPS TENODESIS;  Surgeon: Huel Cote, MD;  Location: [facility] OR;  Service: Orthopedics;  Laterality: Left;   TONSILLECTOMY     TOTAL HIP ARTHROPLASTY Right 04/26/2018   Procedure: RIGHT TOTAL HIP ARTHROPLASTY ANTERIOR APPROACH;  Surgeon: Tarry Kos, MD;  Location: [facility] OR;  Service: Orthopedics;  Laterality: Right;   TOTAL HIP ARTHROPLASTY Left 11/15/2018   TOTAL HIP ARTHROPLASTY Left 11/15/2018   Procedure: LEFT TOTAL HIP ARTHROPLASTY ANTERIOR APPROACH;  Surgeon: Tarry Kos, MD;  Location: [facility] OR;  Service:  Orthopedics;  Laterality: Left;       Home Medications    Prior to Admission medications   Medication Sig Start Date End Date Taking? Authorizing Provider  permethrin (ELIMITE) 5 % cream Apply 1 Application topically once for 1 dose. 09/17/22 09/17/22 Yes Cheron Schaumann K, PA-C  amLODipine (NORVASC) 5 MG tablet Take 1 tablet (5 mg total) by mouth daily. Please make a PCP appointment for more refills. 09/01/22   Marcine Matar, MD  aspirin EC 325 MG tablet Take 1 tablet (325 mg total) by mouth daily. 07/21/22   Huel Cote, MD  carvedilol (COREG) 3.125 MG tablet Take 1 tablet (3.125 mg total) by mouth 2 (two) times daily. 09/10/21   Marcine Matar, MD  Evolocumab (REPATHA SURECLICK) 140 MG/ML SOAJ Inject 1 mL into the skin every 14 (fourteen) days. 12/05/21   Croitoru, Mihai, MD  fluticasone (FLONASE) 50 MCG/ACT nasal spray PLACE 1 SPRAY INTO BOTH NOSTRILS DAILY AS NEEDED FOR ALLERGIES OR RHINITIS. 09/01/22   Marcine Matar, MD  lisinopril (ZESTRIL) 10 MG tablet Take 1 tablet (10 mg total) by mouth 2 (two) times daily. Please make a PCP appointment. 07/29/22   Marcine Matar, MD  loratadine (CLARITIN) 10 MG tablet Take 10 mg by mouth daily as needed for allergies.    [provider]  methocarbamol (ROBAXIN) 500 MG tablet Take 1 tablet (500 mg total) by mouth 2 (two) times daily as needed for muscle spasms. 10/01/21   Cristie Hem, PA-C  oxyCODONE (ROXICODONE) 5 MG immediate release tablet Take 1 tablet (5 mg total) by mouth every 4 (four) hours as needed for severe pain or breakthrough pain. 07/21/22   Huel Cote, MD  potassium chloride (KLOR-CON) 10 MEQ tablet Take 1 tablet (10 mEq total) by mouth daily. Please make PCP appointment. 07/29/22   Marcine Matar, MD    Family History Family History  Problem Relation Age of Onset   CAD Father        MI/CABG age 72    Social History Social History   Tobacco Use   Smoking status: Former    Packs/day: .5    Types: Cigarettes    Quit date: 08/18/2000    Years since quitting: 22.0    Passive exposure: Past   Smokeless tobacco: Never   Tobacco comments:    Smoked lightly for approx 20 years, quit 1990s  Vaping Use   Vaping Use: Never used  Substance Use Topics   Alcohol use: Yes    Comment: occasionally   Drug use: Yes    Types: Marijuana    Comment: every night (to sleep)     Allergies   Statins   Review of Systems Review  of Systems  Skin:  Positive for rash.  All other systems reviewed and are negative.    Physical Exam Triage Vital Signs ED Triage Vitals [09/17/22 1517]  Enc Vitals Group     BP (!) 143/94     Pulse Rate 72     Resp 16     Temp 97.6 F (36.4 C)     Temp Source Oral     SpO2 96 %     Weight      Height      Head Circumference      Peak Flow      Pain Score 0     Pain Loc      Pain Edu?      Excl. in GC?  No data found.  Updated Vital Signs BP (!) 143/94 (BP Location: Right Arm)   Pulse 72   Temp 97.6 F (36.4 C) (Oral)   Resp 16   SpO2 96%   Visual Acuity Right Eye Distance:   Left Eye Distance:   Bilateral Distance:    Right Eye Near:   Left Eye Near:    Bilateral Near:     Physical Exam Vitals and nursing note reviewed.  Constitutional:      Appearance: He is well-developed.  HENT:     Head: Normocephalic.  Pulmonary:     Effort: Pulmonary effort is normal.  Abdominal:     General: There is no distension.  Musculoskeletal:        General: Normal range of motion.     Cervical back: Normal range of motion.  Skin:    Findings: Rash present.     Comments: Rash looks like burrows   Neurological:     Mental Status: He is alert and oriented to person, place, and time.      UC Treatments / Results  Labs (all labs ordered are listed, but only abnormal results are displayed) Labs Reviewed - No data to display  EKG   Radiology No results found.  Procedures Procedures (including critical care time)  Medications Ordered in UC Medications - No data to display  Initial Impression / Assessment and Plan / UC Course  I have reviewed the triage vital signs and the nursing notes.  Pertinent labs & imaging results that were available during my care of the patient were reviewed by me and considered in my medical decision making (see chart for details).      Final Clinical Impressions(s) / UC Diagnoses   Final diagnoses:  Scabies    Discharge Instructions   None    ED Prescriptions     Medication Sig Dispense Auth. Provider   permethrin (ELIMITE) 5 % cream Apply 1 Application topically once for 1 dose. 60 g Elson Areas, [place]      PDMP not reviewed this encounter. An After Visit Summary was printed and given to the patient.    Elson Areas, [place] 09/17/22 1541

## 2022-09-19 ENCOUNTER — Telehealth: Payer: Self-pay | Admitting: Internal Medicine

## 2022-09-19 NOTE — Telephone Encounter (Signed)
Copied from CRM [id number]. Topic: Appointment Scheduling - Scheduling Inquiry for Clinic >> Sep 19, 2022  8:21 AM Dondra Prader A wrote: Reason for CRM: Pt is calling requesting to speak with his PCP. Per pt he is wanting to speak with his PCP regarding an incident the other day at the outpatient hospital. Pt was wanting a telephone visit today with his PCP, per pt cannot do a virtual visit since his cell phone does not have a camera. Please advise.

## 2022-09-23 ENCOUNTER — Encounter (HOSPITAL_BASED_OUTPATIENT_CLINIC_OR_DEPARTMENT_OTHER): Payer: Medicaid Other | Admitting: Physical Therapy

## 2022-09-23 ENCOUNTER — Telehealth: Payer: Self-pay | Admitting: Internal Medicine

## 2022-09-23 MED ORDER — PERMETHRIN 5 % EX CREA: TOPICAL_CREAM | CUTANEOUS | 0 refills | Status: DC

## 2022-09-23 NOTE — Telephone Encounter (Signed)
Patient returned call and says he is needing this cream because the places on his legs where the scabies are have not completely healed and this cream has really helped. He says the tube says use once, so he used all at one time. He says he used the last Rx on 09/17/22, so there are no more refills left. Advised this message has already been sent to Dr. Laural Benes and as soon as she is able to review, she will make a recommendation either to be seen or she will refill it. He says he's sure she doesn't want to see him with what he has and spread it in the office, so if he could get a refill today, it would be great. Advised someone will call with her recommendation if not today, then tomorrow. He verbalized understanding.

## 2022-09-23 NOTE — Telephone Encounter (Signed)
Medication Refill - Medication: Elimite cream  Pt called in checking status of refill , gave him 48-72 hour turn around  Has the patient contacted their pharmacy? yes (Agent: If no, request that the patient contact the pharmacy for the refill. If patient does not wish to contact the pharmacy document the reason why and proceed with request.) (Agent: If yes, when and what did the pharmacy advise?)contact pcp  Preferred Pharmacy (with phone number or street name):  [id number] - [place],  - [address] Phone: [phone]  Fax: [phone]     Has the patient been seen for an appointment in the last year OR does the patient have an upcoming appointment? yes  Agent: Please be advised that RX refills may take up to 3 business days. We ask that you follow-up with your pharmacy.

## 2022-09-23 NOTE — Telephone Encounter (Signed)
Being addressed in a separate encounter on 09/19/22, addend and added notes from today, routed to provider for approval.

## 2022-09-23 NOTE — Telephone Encounter (Signed)
The patient states the medication has done pretty good. He still has a few spots that are still causing him problems. He feels like 1 more prescription should really do the job. He states the directions say to use the whole tube at 1 time. He uses   [id number] - [place], [place] - [address] Phone: [phone]  Fax: [phone]     Please assist patient further

## 2022-09-23 NOTE — Addendum Note (Signed)
Addended by: [name] Blue B on: 09/23/2022 06:05 PM   Modules accepted: Orders

## 2022-09-30 ENCOUNTER — Encounter (HOSPITAL_BASED_OUTPATIENT_CLINIC_OR_DEPARTMENT_OTHER): Payer: Medicaid Other | Admitting: Physical Therapy

## 2022-10-07 ENCOUNTER — Ambulatory Visit (HOSPITAL_BASED_OUTPATIENT_CLINIC_OR_DEPARTMENT_OTHER): Payer: Medicaid Other | Admitting: Physical Therapy

## 2022-10-10 ENCOUNTER — Other Ambulatory Visit: Payer: Self-pay | Admitting: Internal Medicine

## 2022-10-10 DIAGNOSIS — I10 Essential (primary) hypertension: Secondary | ICD-10-CM

## 2022-10-14 ENCOUNTER — Encounter (HOSPITAL_BASED_OUTPATIENT_CLINIC_OR_DEPARTMENT_OTHER): Payer: Medicaid Other | Admitting: Physical Therapy

## 2022-10-15 ENCOUNTER — Telehealth (HOSPITAL_BASED_OUTPATIENT_CLINIC_OR_DEPARTMENT_OTHER): Payer: Self-pay | Admitting: Physical Therapy

## 2022-10-15 ENCOUNTER — Ambulatory Visit (HOSPITAL_BASED_OUTPATIENT_CLINIC_OR_DEPARTMENT_OTHER): Payer: Medicaid Other | Admitting: Orthopaedic Surgery

## 2022-10-15 ENCOUNTER — Ambulatory Visit (HOSPITAL_BASED_OUTPATIENT_CLINIC_OR_DEPARTMENT_OTHER): Payer: Medicaid Other | Attending: Orthopaedic Surgery | Admitting: Physical Therapy

## 2022-10-15 DIAGNOSIS — M25612 Stiffness of left shoulder, not elsewhere classified: Secondary | ICD-10-CM | POA: Insufficient documentation

## 2022-10-15 DIAGNOSIS — M25512 Pain in left shoulder: Secondary | ICD-10-CM | POA: Insufficient documentation

## 2022-10-15 DIAGNOSIS — M6281 Muscle weakness (generalized): Secondary | ICD-10-CM | POA: Insufficient documentation

## 2022-10-15 NOTE — Telephone Encounter (Signed)
Attempted to call patient regarding missed visit this morning. Unable to leave voicemail.   Zebedee Iba PT, DPT 10/15/22 12:52 PM

## 2022-10-21 ENCOUNTER — Ambulatory Visit (HOSPITAL_BASED_OUTPATIENT_CLINIC_OR_DEPARTMENT_OTHER): Payer: Medicaid Other | Admitting: Physical Therapy

## 2022-10-21 ENCOUNTER — Ambulatory Visit: Payer: Medicaid Other | Admitting: Internal Medicine

## 2022-10-23 ENCOUNTER — Ambulatory Visit: Payer: Medicaid Other | Attending: Internal Medicine | Admitting: Internal Medicine

## 2022-10-23 ENCOUNTER — Emergency Department (HOSPITAL_COMMUNITY)
Admission: EM | Admit: 2022-10-23 | Discharge: 2022-10-23 | Disposition: A | Discharge status: Home / Self Care | Payer: Medicaid Other | Attending: Emergency Medicine | Admitting: Emergency Medicine

## 2022-10-23 ENCOUNTER — Other Ambulatory Visit: Payer: Self-pay

## 2022-10-23 ENCOUNTER — Encounter (HOSPITAL_COMMUNITY): Payer: Self-pay

## 2022-10-23 ENCOUNTER — Encounter: Payer: Self-pay | Admitting: Internal Medicine

## 2022-10-23 VITALS — BP 119/71 | HR 69 | Ht 73.0 in | Wt 215.8 lb

## 2022-10-23 DIAGNOSIS — L03012 Cellulitis of left finger: Secondary | ICD-10-CM | POA: Diagnosis not present

## 2022-10-23 DIAGNOSIS — Z7982 Long term (current) use of aspirin: Secondary | ICD-10-CM | POA: Insufficient documentation

## 2022-10-23 DIAGNOSIS — L089 Local infection of the skin and subcutaneous tissue, unspecified: Secondary | ICD-10-CM | POA: Diagnosis not present

## 2022-10-23 MED ORDER — DOXYCYCLINE HYCLATE 100 MG PO TABS
100.0000 mg | ORAL_TABLET | Freq: Once | ORAL | Status: AC
  Administered 2022-10-23: 100 mg via ORAL
  Filled 2022-10-23: qty 1

## 2022-10-23 MED ORDER — DOXYCYCLINE HYCLATE 100 MG PO CAPS: 100.0000 mg | ORAL_CAPSULE | Freq: Two times a day (BID) | ORAL | 0 refills | Status: DC

## 2022-10-23 NOTE — Progress Notes (Signed)
Patient ID: Joshua [name], male    DOB: 09/24/1960  MRN: [id number]  CC: Hospitalization Follow-up   Subjective: Joshua [name] is a 62 y.o. male who presents for His concerns today include:  Patient with history of HTN, OA BL hips s/p TKR, prediabetes, CAD [with stent to LAD, RCA], EF 55-60%%, bradycardia , bicuspid aortic valve, mod AS, mild dilated ascending aorta    Patient presents today complaining of redness and swelling of the left hand x 2 weeks.  Patient states that everything started about a month ago after he had trimmed some azalea bushes.  He developed some spots on the left forearm.  He was seen in the emergency room and told that he had scabies.  Since then he had developed lesions on the upper thigh right greater than left that have since crusted over.  He denies any insect bites that he is aware of.  Patient Active Problem List   Diagnosis Date Noted   Nontraumatic complete tear of left rotator cuff 07/21/2022   Biceps tendinitis of right upper extremity    Rotator cuff syndrome of right shoulder 10/16/2021   Other insomnia 04/30/2021   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   COVID-19 vaccine regimen to maintain immunity completed 04/27/2020   Statin intolerance 12/10/2018   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/15/2018   Chronic left shoulder pain 06/08/2018   Status post total hip replacement, right 04/26/2018   Mild dilation of ascending aorta (HCC) 12/26/2017   Dyslipidemia 12/26/2017   Bilateral carotid bruits 12/26/2017   Nocturnal hypoxemia 06/29/2017   Primary osteoarthritis of both hips 06/29/2017   Chronic diastolic heart failure (HCC)    Coronary artery disease involving native coronary artery of native heart without angina pectoris 03/31/2017   Ischemic cardiomyopathy 03/31/2017   Marijuana use 03/31/2017   Prediabetes 03/31/2017   Aortic valve insufficiency 12/28/2016   Mitral valve regurgitation 12/28/2016   Obesity  12/28/2016   Aortic valve stenosis, nonrheumatic    Essential hypertension      Current Outpatient Medications on File Prior to Visit  Medication Sig Dispense Refill   amLODipine (NORVASC) 5 MG tablet Take 1 tablet (5 mg total) by mouth daily. Please make a PCP appointment for more refills. 30 tablet 0   aspirin EC 325 MG tablet Take 1 tablet (325 mg total) by mouth daily. 30 tablet 0   carvedilol (COREG) 3.125 MG tablet Take 1 tablet (3.125 mg total) by mouth 2 (two) times daily. 180 tablet 2   Evolocumab (REPATHA SURECLICK) 140 MG/ML SOAJ Inject 1 mL into the skin every 14 (fourteen) days. 6 mL 3   fluticasone (FLONASE) 50 MCG/ACT nasal spray PLACE 1 SPRAY INTO BOTH NOSTRILS DAILY AS NEEDED FOR ALLERGIES OR RHINITIS. 48 mL 0   lisinopril (ZESTRIL) 10 MG tablet TAKE 1 TABLET (10 MG TOTAL) BY MOUTH 2 (TWO) TIMES DAILY. PLEASE MAKE A PCP APPOINTMENT. 60 tablet 0   loratadine (CLARITIN) 10 MG tablet Take 10 mg by mouth daily as needed for allergies.     methocarbamol (ROBAXIN) 500 MG tablet Take 1 tablet (500 mg total) by mouth 2 (two) times daily as needed for muscle spasms. 20 tablet 2   oxyCODONE (ROXICODONE) 5 MG immediate release tablet Take 1 tablet (5 mg total) by mouth every 4 (four) hours as needed for severe pain or breakthrough pain. 30 tablet 0   permethrin (ELIMITE) 5 % cream Apply from head to soles of the feet.  Leave  on for 8 hours before washing off. 30 g 0   potassium chloride (KLOR-CON) 10 MEQ tablet Take 1 tablet (10 mEq total) by mouth daily. Please make PCP appointment. 30 tablet 0   No current facility-administered medications on file prior to visit.    Allergies  Allergen Reactions   Statins Other (See Comments)    Elevated LFTs    Social History   Socioeconomic History   Marital status: Single    Spouse name: Not on file   Number of children: 0   Years of education: Not on file   Highest education level: Not on file  Occupational History   Occupation:  Retired-Upholstery  Tobacco Use   Smoking status: Former    Current packs/day: 0.00    Types: Cigarettes    Quit date: 08/18/2000    Years since quitting: 22.1    Passive exposure: Past   Smokeless tobacco: Never   Tobacco comments:    Smoked lightly for approx 20 years, quit 1990s  Vaping Use   Vaping status: Never Used  Substance and Sexual Activity   Alcohol use: Yes    Comment: occasionally   Drug use: Yes    Types: Marijuana    Comment: every night (to sleep)   Sexual activity: Not on file  Other Topics Concern   Not on file  Social History Narrative   Lives Home alone. Sister makes all health related decisions. Declined Advanced directive at this time   Social Determinants of Health   Financial Resource Strain: Not on file  Food Insecurity: Not on file  Transportation Needs: Not on file  Physical Activity: Not on file  Stress: Not on file  Social Connections: Not on file  Intimate Partner Violence: Not on file    Family History  Problem Relation Age of Onset   CAD Father        MI/CABG age 28    Past Surgical History:  Procedure Laterality Date   CORONARY STENT INTERVENTION N/A 12/29/2016   Procedure: CORONARY STENT INTERVENTION;  Surgeon: Corky Crafts, MD;  Location: [facility] INVASIVE CV LAB;  Service: Cardiovascular;  Laterality: N/A;   RIGHT/LEFT HEART CATH AND CORONARY ANGIOGRAPHY N/A 12/29/2016   Procedure: RIGHT/LEFT HEART CATH AND CORONARY ANGIOGRAPHY;  Surgeon: Corky Crafts, MD;  [facility] IN[facility];  Service: Cardiovascular;  Laterality: N/A;   SHOULDER ARTHROSCOPY WITH ROTATOR CUFF REPAIR AND OPEN BICEPS TENODESIS Right 11/28/2021   Procedure: RIGHT SHOULDER ARTHROSCOPY WITH ROTATOR CUFF REPAIR AND OPEN BICEPS TENODESIS;  Surgeon: Huel Cote, MD;  Location: [facility] OR;  Service: Orthopedics;  Laterality: Right;   SHOULDER ARTHROSCOPY WITH ROTATOR CUFF REPAIR AND OPEN BICEPS TENODESIS Left 07/21/2022   Procedure: LEFT SHOULDER ARTHROSCOPY  WITH ROTATOR CUFF REPAIR AND BICEPS TENODESIS;  Surgeon: Huel Cote, MD;  Location: [facility] OR;  Service: Orthopedics;  Laterality: Left;   TONSILLECTOMY     TOTAL HIP ARTHROPLASTY Right 04/26/2018   Procedure: RIGHT TOTAL HIP ARTHROPLASTY ANTERIOR APPROACH;  Surgeon: Tarry Kos, MD;  Location: [facility] OR;  Service: Orthopedics;  Laterality: Right;   TOTAL HIP ARTHROPLASTY Left 11/15/2018   TOTAL HIP ARTHROPLASTY Left 11/15/2018   Procedure: LEFT TOTAL HIP ARTHROPLASTY ANTERIOR APPROACH;  Surgeon: Tarry Kos, MD;  Location: [facility] OR;  Service: Orthopedics;  Laterality: Left;    ROS: Review of Systems Negative except as stated above  PHYSICAL EXAM: BP 119/71   Pulse 69   Ht 6\' 1"  (1.854 m)   Wt 215 lb  12.8 oz (97.9 kg)   SpO2 99%   BMI 28.47 kg/m   Physical Exam   General appearance - alert, well appearing, and in no distress Mental status -patient is very talkative and a poor historian Skin -patient has severe redness and swelling of the left index finger with some blistering noted.  Redness extends to the palm.  He has a crusted/scabbed lesion on the medial aspect of the third digit.  He has several scabbed over lesions on the right thigh.       Latest Ref Rng & Units 07/21/2022   10:59 AM 11/28/2021   11:00 AM 09/10/2021   11:32 AM  CMP  Glucose 70 - 99 mg/dL 027  253    BUN 8 - 23 mg/dL 19  13    Creatinine 6.64 - 1.24 mg/dL 4.03  4.74    Sodium 259 - 145 mmol/L 137  139    Potassium 3.5 - 5.1 mmol/L 3.9  4.1    Chloride 98 - 111 mmol/L 105  106    CO2 22 - 32 mmol/L 24  25    Calcium 8.9 - 10.3 mg/dL 8.5  9.4    Total Protein 6.0 - 8.5 g/dL   6.9   Total Bilirubin 0.0 - 1.2 mg/dL   0.4   Alkaline Phos 44 - 121 IU/L   142   AST 0 - 40 IU/L   21   ALT 0 - 44 IU/L   58    Lipid Panel     Component Value Date/Time   CHOL 114 09/10/2021 1132   TRIG 167 (H) 09/10/2021 1132   HDL 34 (L) 09/10/2021 1132   CHOLHDL 3.4 09/10/2021 1132   CHOLHDL 4.6 12/27/2016 0238   VLDL 18  12/27/2016 0238   LDLCALC 52 09/10/2021 1132    CBC    Component Value Date/Time   WBC 6.7 07/21/2022 1059   RBC 4.77 07/21/2022 1059   HGB 14.8 07/21/2022 1059   HGB 17.4 12/28/2020 0953   HCT 42.6 07/21/2022 1059   HCT 51.2 (H) 12/28/2020 0953   PLT 158 07/21/2022 1059   PLT 184 12/28/2020 0953   MCV 89.3 07/21/2022 1059   MCV 94 12/28/2020 0953   MCH 31.0 07/21/2022 1059   MCHC 34.7 07/21/2022 1059   RDW 13.2 07/21/2022 1059   RDW 13.3 12/28/2020 0953   LYMPHSABS 1.8 09/29/2019 1420   MONOABS 0.6 11/22/2018 1520   EOSABS 0.0 09/29/2019 1420   BASOSABS 0.0 09/29/2019 1420    ASSESSMENT AND PLAN: 1. Abscess around nail of left index finger Patient advised to be seen in the emergency room.  Looks like he will need I&D and intravenous antibiotics.     Patient was given the opportunity to ask questions.  Patient verbalized understanding of the plan and was able to repeat key elements of the plan.   This documentation was completed using Paediatric nurse.  Any transcriptional errors are unintentional.  No orders of the defined types were placed in this encounter.    Requested Prescriptions    No prescriptions requested or ordered in this encounter    No follow-ups on file.  [name] Blue, MD, FACP

## 2022-10-23 NOTE — ED Provider Notes (Signed)
[place] EMERGENCY DEPARTMENT AT [facility] Provider Note   CSN: [id number] Arrival date & time: 10/23/22  1738     History  Chief Complaint  Patient presents with   Wound Infection    Joshua [name] is a 62 y.o. male.  62 yo M with a chief complaint of infection to his finger.  The patient unfortunately has struggled with diffuse ulcerations that he thinks is due to scabies.  He developed significant swelling to his finger about 2 weeks ago.  He denies any fevers.  He went to see his family doctor today and they are concerned that he had a deep space infection that needed incision and drainage and IV antibiotics and was sent here for evaluation.  He thinks it started after he was trimming some bushes.        Home Medications Prior to Admission medications   Medication Sig Start Date End Date Taking? Authorizing Provider  doxycycline (VIBRAMYCIN) 100 MG capsule Take 1 capsule (100 mg total) by mouth 2 (two) times daily. One po bid x 7 days 10/23/22  Yes Melene Plan, DO  amLODipine (NORVASC) 5 MG tablet Take 1 tablet (5 mg total) by mouth daily. Please make a PCP appointment for more refills. 09/01/22   Marcine Matar, MD  aspirin EC 325 MG tablet Take 1 tablet (325 mg total) by mouth daily. 07/21/22   Huel Cote, MD  carvedilol (COREG) 3.125 MG tablet Take 1 tablet (3.125 mg total) by mouth 2 (two) times daily. 09/10/21   Marcine Matar, MD  Evolocumab (REPATHA SURECLICK) 140 MG/ML SOAJ Inject 1 mL into the skin every 14 (fourteen) days. 12/05/21   Croitoru, Mihai, MD  fluticasone (FLONASE) 50 MCG/ACT nasal spray PLACE 1 SPRAY INTO BOTH NOSTRILS DAILY AS NEEDED FOR ALLERGIES OR RHINITIS. 09/01/22   Marcine Matar, MD  lisinopril (ZESTRIL) 10 MG tablet TAKE 1 TABLET (10 MG TOTAL) BY MOUTH 2 (TWO) TIMES DAILY. PLEASE MAKE A PCP APPOINTMENT. 10/10/22   Marcine Matar, MD  loratadine (CLARITIN) 10 MG tablet Take 10 mg by mouth daily as needed for allergies.     [provider]  methocarbamol (ROBAXIN) 500 MG tablet Take 1 tablet (500 mg total) by mouth 2 (two) times daily as needed for muscle spasms. 10/01/21   Cristie Hem, PA-C  oxyCODONE (ROXICODONE) 5 MG immediate release tablet Take 1 tablet (5 mg total) by mouth every 4 (four) hours as needed for severe pain or breakthrough pain. 07/21/22   Huel Cote, MD  permethrin (ELIMITE) 5 % cream Apply from head to soles of the feet.  Leave on for 8 hours before washing off. 09/23/22   Marcine Matar, MD  potassium chloride (KLOR-CON) 10 MEQ tablet Take 1 tablet (10 mEq total) by mouth daily. Please make PCP appointment. 07/29/22   Marcine Matar, MD      Allergies    Statins    Review of Systems   Review of Systems  Physical Exam Updated Vital Signs BP 125/74 (BP Location: Left Arm)   Pulse 68   Temp 98.6 F (37 C) (Oral)   Resp 14   Ht 6\' 1"  (1.854 m)   Wt 97.5 kg   SpO2 96%   BMI 28.37 kg/m  Physical Exam Vitals and nursing note reviewed.  Constitutional:      Appearance: He is well-developed.  HENT:     Head: Normocephalic and atraumatic.  Eyes:     Pupils: Pupils  are equal, round, and reactive to light.  Neck:     Vascular: No JVD.  Cardiovascular:     Rate and Rhythm: Normal rate and regular rhythm.     Heart sounds: No murmur heard.    No friction rub. No gallop.  Pulmonary:     Effort: No respiratory distress.     Breath sounds: No wheezing.  Abdominal:     General: There is no distension.     Tenderness: There is no abdominal tenderness. There is no guarding or rebound.  Musculoskeletal:        General: Swelling present. Normal range of motion.     Cervical back: Normal range of motion and neck supple.     Comments: Significant edema to the left second digit.  He has an ulceration along the radial aspect of the finger.  There is some very mild fluctuance to the dorsal aspect of the finger.  No obvious pain along the flexor tendon.  Able to range  without significant issue.  Skin:    Coloration: Skin is not pale.     Findings: No rash.  Neurological:     Mental Status: He is alert and oriented to person, place, and time.  Psychiatric:        Behavior: Behavior normal.     ED Results / Procedures / Treatments   Labs (all labs ordered are listed, but only abnormal results are displayed) Labs Reviewed - No data to display  EKG None  Radiology No results found.  Procedures Procedures    Medications Ordered in ED Medications  doxycycline (VIBRA-TABS) tablet 100 mg (has no administration in time range)    ED Course/ Medical Decision Making/ A&P                             Medical Decision Making Risk Prescription drug management.   62 yo M with a chief complaints of finger pain and swelling.  Seen his PCP earlier today and she sent him here for evaluation.  I do not find an obvious area of fluctuance that would require I&D.  I discussed this with hand surgery on-call, Dr. Yehuda Budd.  He reviewed the images taken earlier in the PCPs office and recommended opening the blistering to the proximal aspect of the finger and having the patient perform warm soapy soaks starting him on antibiotics and he would see him in the office.  9:00 PM:  I have discussed the diagnosis/risks/treatment options with the patient.  Evaluation and diagnostic testing in the emergency department does not suggest an emergent condition requiring admission or immediate intervention beyond what has been performed at this time.  They will follow up with Hand, PCP. We also discussed returning to the ED immediately if new or worsening sx occur. We discussed the sx which are most concerning (e.g., sudden worsening pain, fever, inability to tolerate by mouth) that necessitate immediate return. Medications administered to the patient during their visit and any new prescriptions provided to the patient are listed below.  Medications given during this  visit Medications  doxycycline (VIBRA-TABS) tablet 100 mg (has no administration in time range)     The patient appears reasonably screen and/or stabilized for discharge and I doubt any other medical condition or other [facility] requiring further screening, evaluation, or treatment in the ED at this time prior to discharge.          Final Clinical Impression(s) / ED Diagnoses Final  diagnoses:  Finger infection    Rx / DC Orders ED Discharge Orders          Ordered    doxycycline (VIBRAMYCIN) 100 MG capsule  2 times daily        10/23/22 2058              Melene Plan, DO 10/23/22 2100

## 2022-10-23 NOTE — ED Triage Notes (Signed)
Pt has infection to left index finger, has been dealing with this problem since June. Finger is swollen and red. Pt denies fever, chills or significant pain

## 2022-10-23 NOTE — Discharge Instructions (Signed)
Warm soapy soaks at least 4 times a day.  Take the antibiotics as prescribed.  Please call the hand surgeon tomorrow to try and set up an appointment.  Please return for rapid spreading redness or if you develop a fever.

## 2022-10-24 ENCOUNTER — Other Ambulatory Visit: Payer: Self-pay | Admitting: Internal Medicine

## 2022-10-24 ENCOUNTER — Telehealth: Payer: Self-pay | Admitting: *Deleted

## 2022-10-24 DIAGNOSIS — I10 Essential (primary) hypertension: Secondary | ICD-10-CM

## 2022-10-24 NOTE — Transitions of Care (Post Inpatient/ED Visit) (Signed)
   10/24/2022  Name: Joshua [name] MRN: [id number] DOB: Mar 28, 1961  Today's TOC FU Call Status: Today's TOC FU Call Status:: Unsuccessul Call (1st Attempt) Unsuccessful Call (1st Attempt) Date: 10/24/22  Attempted to reach the patient regarding the most recent Inpatient/ED visit.  Follow Up Plan: Additional outreach attempts will be made to reach the patient to complete the Transitions of Care (Post Inpatient/ED visit) call.   Estanislado Emms RN, BSN [place]  Managed [facility] RN Care Coordinator [phone]

## 2022-10-29 DIAGNOSIS — L089 Local infection of the skin and subcutaneous tissue, unspecified: Secondary | ICD-10-CM | POA: Diagnosis not present

## 2022-11-03 ENCOUNTER — Other Ambulatory Visit: Payer: Self-pay

## 2022-11-03 DIAGNOSIS — I350 Nonrheumatic aortic (valve) stenosis: Secondary | ICD-10-CM

## 2022-11-03 DIAGNOSIS — I5032 Chronic diastolic (congestive) heart failure: Secondary | ICD-10-CM

## 2022-11-04 DIAGNOSIS — L089 Local infection of the skin and subcutaneous tissue, unspecified: Secondary | ICD-10-CM | POA: Diagnosis not present

## 2022-11-07 ENCOUNTER — Other Ambulatory Visit: Payer: Self-pay | Admitting: Internal Medicine

## 2022-11-07 NOTE — Telephone Encounter (Signed)
Requested medication (s) are due for refill today - provider review   Requested medication (s) are on the active medication list -yes  Future visit scheduled -no  Last refill: 09/23/22 30g  Notes to clinic: off protocol- provider review   Requested Prescriptions  Pending Prescriptions Disp Refills   permethrin (ELIMITE) 5 % cream [Pharmacy Med Name: PERMETHRIN 5% CREAM] 60 g     Sig: Apply from head to soles of the feet.  Leave on for 8 hours before washing off.     Off-Protocol Failed - 11/07/2022 12:34 PM      Failed - Medication not assigned to a protocol, review manually.      Passed - Valid encounter within last 12 months    Recent Outpatient Visits           2 weeks ago Abscess around nail of left index finger   [place] [facility] Marcine Matar, MD   10 months ago Essential hypertension   [place] [facility] Marcine Matar, MD   1 year ago Essential hypertension   [place] [facility] Marcine Matar, MD   1 year ago Essential hypertension   [place] [facility] Marcine Matar, MD   1 year ago Essential hypertension   [place] [facility] Marcine Matar, MD       Future Appointments             In 1 month Croitoru, Rachelle Hora, MD [place] [facility]   In 3 months [place] James, Nile Dear, MD [facility]               Requested Prescriptions  Pending Prescriptions Disp Refills   permethrin (ELIMITE) 5 % cream [Pharmacy Med Name: PERMETHRIN 5% CREAM] 60 g     Sig: Apply from head to soles of the feet.  Leave on for 8 hours before washing off.     Off-Protocol Failed - 11/07/2022 12:34 PM      Failed - Medication not assigned to a protocol, review manually.      Passed - Valid encounter within last 12 months    Recent Outpatient Visits           2  weeks ago Abscess around nail of left index finger   [place] [facility] Marcine Matar, MD   10 months ago Essential hypertension   [place] [facility] Marcine Matar, MD   1 year ago Essential hypertension   [place] [facility] Marcine Matar, MD   1 year ago Essential hypertension   [place] [facility] Marcine Matar, MD   1 year ago Essential hypertension    [facility] Marcine Matar, MD       Future Appointments             In 1 month Croitoru, Rachelle Hora, MD [facility]   In 3 months McAlhany, Nile Dear, MD [facility]

## 2022-11-10 ENCOUNTER — Telehealth: Payer: Self-pay

## 2022-11-10 ENCOUNTER — Other Ambulatory Visit: Payer: Self-pay | Admitting: Cardiovascular Disease

## 2022-11-10 ENCOUNTER — Other Ambulatory Visit (HOSPITAL_COMMUNITY): Payer: Self-pay

## 2022-11-10 DIAGNOSIS — I2510 Atherosclerotic heart disease of native coronary artery without angina pectoris: Secondary | ICD-10-CM

## 2022-11-10 DIAGNOSIS — I350 Nonrheumatic aortic (valve) stenosis: Secondary | ICD-10-CM

## 2022-11-10 DIAGNOSIS — E785 Hyperlipidemia, unspecified: Secondary | ICD-10-CM

## 2022-11-10 NOTE — Telephone Encounter (Signed)
Pharmacy Patient Advocate Encounter   Received notification from CoverMyMeds that prior authorization for REPATHA is required/requested.   Insurance verification completed.   The patient is insured through [facility] .   Per test claim: PA required; PA submitted to [facility] via CoverMyMeds Key/confirmation #/EOC [id number]       Status is pending

## 2022-11-12 DIAGNOSIS — L089 Local infection of the skin and subcutaneous tissue, unspecified: Secondary | ICD-10-CM | POA: Diagnosis not present

## 2022-11-12 NOTE — Telephone Encounter (Signed)
Pharmacy Patient Advocate Encounter  Received notification from [facility] that Prior Authorization for [employer] has been  Approved  through 7.29.25

## 2022-11-17 ENCOUNTER — Ambulatory Visit
Admission: RE | Admit: 2022-11-17 | Discharge: 2022-11-17 | Disposition: A | Discharge status: Home / Self Care | Payer: Medicaid Other | Source: Ambulatory Visit | Attending: Cardiovascular Disease | Admitting: Cardiovascular Disease

## 2022-11-17 DIAGNOSIS — I5032 Chronic diastolic (congestive) heart failure: Secondary | ICD-10-CM

## 2022-11-17 DIAGNOSIS — I2510 Atherosclerotic heart disease of native coronary artery without angina pectoris: Secondary | ICD-10-CM | POA: Diagnosis not present

## 2022-11-17 DIAGNOSIS — I350 Nonrheumatic aortic (valve) stenosis: Secondary | ICD-10-CM

## 2022-11-17 DIAGNOSIS — D3502 Benign neoplasm of left adrenal gland: Secondary | ICD-10-CM | POA: Diagnosis not present

## 2022-11-17 DIAGNOSIS — I700 Atherosclerosis of aorta: Secondary | ICD-10-CM | POA: Diagnosis not present

## 2022-11-18 ENCOUNTER — Other Ambulatory Visit: Payer: Self-pay | Admitting: Internal Medicine

## 2022-11-26 ENCOUNTER — Other Ambulatory Visit: Payer: Self-pay | Admitting: Internal Medicine

## 2022-11-28 ENCOUNTER — Telehealth: Payer: Self-pay | Admitting: Physician Assistant

## 2022-11-28 NOTE — Telephone Encounter (Signed)
Patient called needing a Rx for Amoxicillin. Patient said he is trying to make a dental appointment today if he can get the medication.  The number to contact patient is [phone]

## 2022-11-29 ENCOUNTER — Other Ambulatory Visit: Payer: Self-pay | Admitting: Internal Medicine

## 2022-11-29 DIAGNOSIS — I2510 Atherosclerotic heart disease of native coronary artery without angina pectoris: Secondary | ICD-10-CM

## 2022-11-29 DIAGNOSIS — I10 Essential (primary) hypertension: Secondary | ICD-10-CM

## 2022-12-01 NOTE — Telephone Encounter (Signed)
He doesn't need it for the hip replacements anymore.

## 2022-12-02 NOTE — Telephone Encounter (Signed)
Requested Prescriptions  Pending Prescriptions Disp Refills   carvedilol (COREG) 3.125 MG tablet [Pharmacy Med Name: CARVEDILOL 3.125 MG TABLET] 180 tablet 0    Sig: TAKE 1 TABLET BY MOUTH 2 TIMES DAILY.     Cardiovascular: Beta Blockers 3 Failed - 11/29/2022  5:37 PM      Failed - AST in normal range and within 360 days    AST  Date Value Ref Range Status  09/10/2021 21 0 - 40 IU/L Final         Failed - ALT in normal range and within 360 days    ALT  Date Value Ref Range Status  09/10/2021 58 (H) 0 - 44 IU/L Final         Passed - Cr in normal range and within 360 days    Creatinine, Ser  Date Value Ref Range Status  07/21/2022 0.79 0.61 - 1.24 mg/dL Final         Passed - Last BP in normal range    BP Readings from Last 1 Encounters:  10/23/22 138/85         Passed - Last Heart Rate in normal range    Pulse Readings from Last 1 Encounters:  10/23/22 75         Passed - Valid encounter within last 6 months    Recent Outpatient Visits           1 month ago Abscess around nail of left index finger   [place] [facility] Marcine Matar, MD   10 months ago Essential hypertension   [place] [facility] Marcine Matar, MD   1 year ago Essential hypertension   [place] [facility] Marcine Matar, MD   1 year ago Essential hypertension   [place] [facility] Marcine Matar, MD   1 year ago Essential hypertension   [place] [facility] Marcine Matar, MD       Future Appointments             In 1 month Croitoru, Rachelle Hora, MD [place] [facility]   In 2 months McAlhany, Nile Dear, MD [facility]

## 2022-12-04 ENCOUNTER — Other Ambulatory Visit: Payer: Self-pay | Admitting: Internal Medicine

## 2022-12-04 ENCOUNTER — Ambulatory Visit: Payer: Medicaid Other | Attending: Internal Medicine | Admitting: Internal Medicine

## 2022-12-04 ENCOUNTER — Ambulatory Visit: Payer: Self-pay

## 2022-12-04 ENCOUNTER — Encounter: Payer: Self-pay | Admitting: Internal Medicine

## 2022-12-04 VITALS — BP 109/62 | HR 79 | Ht 73.0 in | Wt 224.2 lb

## 2022-12-04 DIAGNOSIS — L309 Dermatitis, unspecified: Secondary | ICD-10-CM

## 2022-12-04 MED ORDER — AMOXICILLIN-POT CLAVULANATE 500-125 MG PO TABS: 1.0000 | ORAL_TABLET | Freq: Three times a day (TID) | ORAL | 0 refills | Status: DC

## 2022-12-04 MED ORDER — MUPIROCIN CALCIUM 2 % EX CREA
1.0000 | TOPICAL_CREAM | Freq: Every day | CUTANEOUS | 1 refills | Status: DC
Start: 2022-12-04 — End: 2022-12-05

## 2022-12-04 NOTE — Telephone Encounter (Signed)
Summary: Scabies, seeking appt sooner than what is available   Pt called reporting that he needs to be seen sooner than what is available, he says his ointment is not providing him relief. Scabies  Best contact: [phone]         Chief Complaint: Reports he still has scabies rash to thighs. Took "some antibiotics I had and it helped. I need more medicine." "I need the antibiotic refilled I take for dental work." Symptoms: Itching Frequency: Months Pertinent Negatives: Patient denies  Disposition: [] ED /[] Urgent Care (no appt availability in office) / [] Appointment(In office/virtual)/ []  [facility]/ [] Home Care/ [] Refused Recommended Disposition /[] [facility]/ [x]  Follow-up with PCP Additional Notes: Please advise pt.  Reason for Disposition  [1] SEVERE itching (interferes with sleep or activities) AND [2] not improved after 48 hours of over-the-counter antihistamines and steroid cream  Answer Assessment - Initial Assessment Questions 1. DIAGNOSIS CONFIRMATION: "Who diagnosed your scabies?" "When?"      Dr. Laural Benes 2. ONSET:  "When did the scabies rash begin?"     Months ago 3. LOCATION: "Where do you have the scabies rash?"     Thighs 4. SYMPTOMS: "What symptoms are you most concerned about today?"       Pain 5. ITCHING: "How bad is the itching?   - MILD - doesn't interfere with normal activities   - MODERATE-SEVERE: interferes with work, school, sleep, or other activities      Moderate 6. INFECTION: "Does the rash look infected?" (e.g., spreading redness, tenderness, pus)      N/a 7. TREATMENT: "What medicine(s) were you prescribed?" "What's happened since you took or applied the medicine?"     Ointment 8. CLOSE CONTACTS: "Does any person who lives with you or has had close contact with you have itching?" If Yes, ask: "Was the person(s) treated for scabies?"     Unsure  Protocols used: Scabies Follow-up Call-A-AH

## 2022-12-04 NOTE — Progress Notes (Signed)
Patient ID: Joshua [name], male    DOB: 05-06-1960  MRN: [id number]  CC: Sarcoptes Scabiei   Subjective: Joshua [name] is a 62 y.o. male who presents for UC visit His concerns today include:  Patient with history of HTN, OA BL hips s/p TKR, prediabetes, CAD [with stent to LAD, RCA], EF 55-60%%, bradycardia , bicuspid aortic valve, mod AS, mild dilated ascending aorta    Patient presents today requesting antibiotics for scabies.  He has some lesions on his right thigh that was noted on last visit with me 10/23/2022 when he was sent to the emergency room for abscess of the left hand.  One of them became larger and more red over the weekend with some drainage.  He had 4 amoxicillin on hand and took all 4 of them.  States that it decreased in size, redness decreased and no longer drains.  He thinks all of the lesions are due to scabies and has been using scabies cream which he states helps a little.  He has small area on the left thigh and had 1 on the crown of his head that has healed over.  He tells me this all started over the weekend but I see from my last office visit, I made note On physical exam that he had several scabbed over lesions on the right thigh.  He has not had any recent long distance travel or travel outside of the [place].  Denies any fever. Patient Active Problem List   Diagnosis Date Noted   Nontraumatic complete tear of left rotator cuff 07/21/2022   Biceps tendinitis of right upper extremity    Rotator cuff syndrome of right shoulder 10/16/2021   Other insomnia 04/30/2021   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   COVID-19 vaccine regimen to maintain immunity completed 04/27/2020   Statin intolerance 12/10/2018   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/15/2018   Chronic left shoulder pain 06/08/2018   Status post total hip replacement, right 04/26/2018   Mild dilation of ascending aorta (HCC) 12/26/2017   Dyslipidemia 12/26/2017   Bilateral carotid  bruits 12/26/2017   Nocturnal hypoxemia 06/29/2017   Primary osteoarthritis of both hips 06/29/2017   Chronic diastolic heart failure (HCC)    Coronary artery disease involving native coronary artery of native heart without angina pectoris 03/31/2017   Ischemic cardiomyopathy 03/31/2017   Marijuana use 03/31/2017   Prediabetes 03/31/2017   Aortic valve insufficiency 12/28/2016   Mitral valve regurgitation 12/28/2016   Obesity 12/28/2016   Aortic valve stenosis, nonrheumatic    Essential hypertension      Current Outpatient Medications on File Prior to Visit  Medication Sig Dispense Refill   amLODipine (NORVASC) 5 MG tablet Take 1 tablet (5 mg total) by mouth daily. Please make a PCP appointment for more refills. 30 tablet 0   aspirin EC 325 MG tablet Take 1 tablet (325 mg total) by mouth daily. 30 tablet 0   carvedilol (COREG) 3.125 MG tablet TAKE 1 TABLET BY MOUTH 2 TIMES DAILY. 180 tablet 0   doxycycline (VIBRAMYCIN) 100 MG capsule Take 1 capsule (100 mg total) by mouth 2 (two) times daily. One po bid x 7 days 14 capsule 0   Evolocumab (REPATHA SURECLICK) 140 MG/ML SOAJ INJECT 1 ML INTO THE SKIN EVERY 14 (FOURTEEN) DAYS. 6 mL 3   fluticasone (FLONASE) 50 MCG/ACT nasal spray PLACE 1 SPRAY INTO BOTH NOSTRILS DAILY AS NEEDED FOR ALLERGIES OR RHINITIS. 48 mL 0   lisinopril (  ZESTRIL) 10 MG tablet Take 1 tablet (10 mg total) by mouth 2 (two) times daily. 180 tablet 1   loratadine (CLARITIN) 10 MG tablet Take 10 mg by mouth daily as needed for allergies.     methocarbamol (ROBAXIN) 500 MG tablet Take 1 tablet (500 mg total) by mouth 2 (two) times daily as needed for muscle spasms. 20 tablet 2   oxyCODONE (ROXICODONE) 5 MG immediate release tablet Take 1 tablet (5 mg total) by mouth every 4 (four) hours as needed for severe pain or breakthrough pain. 30 tablet 0   permethrin (ELIMITE) 5 % cream APPLY FROM HEAD TO SOLES OF THE FEET. LEAVE ON FOR 8 HOURS BEFORE WASHING OFF. 60 g 0   potassium  chloride (KLOR-CON) 10 MEQ tablet Take 1 tablet (10 mEq total) by mouth daily. Please make PCP appointment. 30 tablet 0   No current facility-administered medications on file prior to visit.    Allergies  Allergen Reactions   Statins Other (See Comments)    Elevated LFTs    Social History   Socioeconomic History   Marital status: Single    Spouse name: Not on file   Number of children: 0   Years of education: Not on file   Highest education level: Not on file  Occupational History   Occupation: Retired-Upholstery  Tobacco Use   Smoking status: Former    Current packs/day: 0.00    Types: Cigarettes    Quit date: 08/18/2000    Years since quitting: 22.3    Passive exposure: Past   Smokeless tobacco: Never   Tobacco comments:    Smoked lightly for approx 20 years, quit 1990s  Vaping Use   Vaping status: Never Used  Substance and Sexual Activity   Alcohol use: Yes    Comment: occasionally   Drug use: Yes    Types: Marijuana    Comment: every night (to sleep)   Sexual activity: Not on file  Other Topics Concern   Not on file  Social History Narrative   Lives Home alone. Sister makes all health related decisions. Declined Advanced directive at this time   Social Determinants of Health   Financial Resource Strain: Not on file  Food Insecurity: Not on file  Transportation Needs: Not on file  Physical Activity: Not on file  Stress: Not on file  Social Connections: Not on file  Intimate Partner Violence: Not on file    Family History  Problem Relation Age of Onset   CAD Father        MI/CABG age 72    Past Surgical History:  Procedure Laterality Date   CORONARY STENT INTERVENTION N/A 12/29/2016   Procedure: CORONARY STENT INTERVENTION;  Surgeon: Corky Crafts, MD;  Location: [facility] INVASIVE CV LAB;  Service: Cardiovascular;  Laterality: N/A;   RIGHT/LEFT HEART CATH AND CORONARY ANGIOGRAPHY N/A 12/29/2016   Procedure: RIGHT/LEFT HEART CATH AND CORONARY  ANGIOGRAPHY;  Surgeon: Corky Crafts, MD;  Location: [facility] INVASIVE CV LAB;  Service: Cardiovascular;  Laterality: N/A;   SHOULDER ARTHROSCOPY WITH ROTATOR CUFF REPAIR AND OPEN BICEPS TENODESIS Right 11/28/2021   Procedure: RIGHT SHOULDER ARTHROSCOPY WITH ROTATOR CUFF REPAIR AND OPEN BICEPS TENODESIS;  Surgeon: Huel Cote, MD;  Location: [facility] OR;  Service: Orthopedics;  Laterality: Right;   SHOULDER ARTHROSCOPY WITH ROTATOR CUFF REPAIR AND OPEN BICEPS TENODESIS Left 07/21/2022   Procedure: LEFT SHOULDER ARTHROSCOPY WITH ROTATOR CUFF REPAIR AND BICEPS TENODESIS;  Surgeon: Huel Cote, MD;  Location: [facility] OR;  Service:  Orthopedics;  Laterality: Left;   TONSILLECTOMY     TOTAL HIP ARTHROPLASTY Right 04/26/2018   Procedure: RIGHT TOTAL HIP ARTHROPLASTY ANTERIOR APPROACH;  Surgeon: Tarry Kos, MD;  Location: [facility] OR;  Service: Orthopedics;  Laterality: Right;   TOTAL HIP ARTHROPLASTY Left 11/15/2018   TOTAL HIP ARTHROPLASTY Left 11/15/2018   Procedure: LEFT TOTAL HIP ARTHROPLASTY ANTERIOR APPROACH;  Surgeon: Tarry Kos, MD;  Location: [facility] OR;  Service: Orthopedics;  Laterality: Left;    ROS: Review of Systems Negative except as stated above  PHYSICAL EXAM: BP 109/62 (BP Location: Right Arm, Patient Position: Sitting, Cuff Size: Normal)   Pulse 79   Ht 6\' 1"  (1.854 m)   Wt 224 lb 3.2 oz (101.7 kg)   SpO2 97%   BMI 29.58 kg/m   Physical Exam   General appearance - alert, well appearing, and in no distress Mental status -patient is a difficult historian.  He is tangential in his thoughts and has to be redirected.  Continuously talks Skin -patient has some crossed over macular areas on the right upper thigh and the right inner knee.  There is surrounding erythema.  No tenderness on palpation.  No pus expressed.  Few areas noted on the left thigh.  No inguinal lymphadenopathy.   The above 2 images were taken of the right leg/thigh      Latest Ref Rng & Units 07/21/2022   10:59 AM  11/28/2021   11:00 AM 09/10/2021   11:32 AM  CMP  Glucose 70 - 99 mg/dL 562  130    BUN 8 - 23 mg/dL 19  13    Creatinine 8.65 - 1.24 mg/dL 7.84  6.96    Sodium 295 - 145 mmol/L 137  139    Potassium 3.5 - 5.1 mmol/L 3.9  4.1    Chloride 98 - 111 mmol/L 105  106    CO2 22 - 32 mmol/L 24  25    Calcium 8.9 - 10.3 mg/dL 8.5  9.4    Total Protein 6.0 - 8.5 g/dL   6.9   Total Bilirubin 0.0 - 1.2 mg/dL   0.4   Alkaline Phos 44 - 121 IU/L   142   AST 0 - 40 IU/L   21   ALT 0 - 44 IU/L   58    Lipid Panel     Component Value Date/Time   CHOL 114 09/10/2021 1132   TRIG 167 (H) 09/10/2021 1132   HDL 34 (L) 09/10/2021 1132   CHOLHDL 3.4 09/10/2021 1132   CHOLHDL 4.6 12/27/2016 0238   VLDL 18 12/27/2016 0238   LDLCALC 52 09/10/2021 1132    CBC    Component Value Date/Time   WBC 6.7 07/21/2022 1059   RBC 4.77 07/21/2022 1059   HGB 14.8 07/21/2022 1059   HGB 17.4 12/28/2020 0953   HCT 42.6 07/21/2022 1059   HCT 51.2 (H) 12/28/2020 0953   PLT 158 07/21/2022 1059   PLT 184 12/28/2020 0953   MCV 89.3 07/21/2022 1059   MCV 94 12/28/2020 0953   MCH 31.0 07/21/2022 1059   MCHC 34.7 07/21/2022 1059   RDW 13.2 07/21/2022 1059   RDW 13.3 12/28/2020 0953   LYMPHSABS 1.8 09/29/2019 1420   MONOABS 0.6 11/22/2018 1520   EOSABS 0.0 09/29/2019 1420   BASOSABS 0.0 09/29/2019 1420    ASSESSMENT AND PLAN:  1. Dermatitis of lower extremity I do not think this is scabies and I have told the patient so. I  recommend referral to dermatology to have 1 of these areas biopsied.  Will also refer him to the wound care center.  Will check some baseline labs.  Our lab was already closed for the afternoon so he will come back tomorrow.  We will placed him on Augmentin for 7 days.  Recommend doing some dressing changes with Bactroban ointment daily then applying clean 2 x 2 gauze to the area. - Ambulatory referral to Dermatology - AMB referral to wound care center - Cryoglobulin; Future - Sedimentation  Rate; Future - C-reactive protein; Future - amoxicillin-clavulanate (AUGMENTIN) 500-125 MG tablet; Take 1 tablet by mouth 3 (three) times daily.  Dispense: 14 tablet; Refill: 0 - mupirocin cream (BACTROBAN) 2 %; Apply 1 Application topically daily.  Dispense: 30 g; Refill: 1 - CBC; Future - Comprehensive metabolic panel; Future - HIV antibody (with reflex); Future    Patient was given the opportunity to ask questions.  Patient verbalized understanding of the plan and was able to repeat key elements of the plan.   This documentation was completed using Paediatric nurse.  Any transcriptional errors are unintentional.  No orders of the defined types were placed in this encounter.    Requested Prescriptions    No prescriptions requested or ordered in this encounter    No follow-ups on file.  [name] Blue, MD, FACP

## 2022-12-04 NOTE — Telephone Encounter (Signed)
Patient scheduled today with PCP.

## 2022-12-05 ENCOUNTER — Ambulatory Visit: Payer: Medicaid Other | Admitting: Internal Medicine

## 2022-12-05 MED ORDER — MUPIROCIN 2 % EX OINT: 1.0000 | TOPICAL_OINTMENT | Freq: Two times a day (BID) | CUTANEOUS | 2 refills | Status: DC

## 2022-12-09 ENCOUNTER — Ambulatory Visit: Payer: Self-pay | Admitting: *Deleted

## 2022-12-09 ENCOUNTER — Encounter (HOSPITAL_BASED_OUTPATIENT_CLINIC_OR_DEPARTMENT_OTHER): Payer: Medicaid Other | Attending: Internal Medicine | Admitting: Internal Medicine

## 2022-12-09 ENCOUNTER — Other Ambulatory Visit: Payer: Medicaid Other

## 2022-12-09 DIAGNOSIS — L309 Dermatitis, unspecified: Secondary | ICD-10-CM

## 2022-12-09 NOTE — Telephone Encounter (Signed)
Summary: still experiecing symptoms   Patient called and stated he is out of medication and needs a refill - that his scabies have not cleared up, they are still there.He said there are a few new spots, he said medication worked about 75% for his scabies, he needs a refill that this has been the best he has used.  mupirocin ointment (BACTROBAN) 2 %   amoxicillin-clavulanate (AUGMENTIN) 500-125 MG tablet    Patients callback #[phone]      Chief Complaint: Medication Symptoms: NA Frequency: NA Pertinent Negatives: Patient denies NA Disposition: [] ED /[] Urgent Care (no appt availability in office) / [] Appointment(In office/virtual)/ []  [facility]/ [] Home Care/ [] Refused Recommended Disposition /[] [facility]/ [x]  Follow-up with PCP Additional Notes:  Pt requesting "More of that medicine she gave me, only thing that has worked." Referring to mupirocin ointment (BACTROBAN) 2 % amoxicillin-clavulanate (AUGMENTIN) 500-125 MG tablet States "Almost cleared up, but 2 new spots." States "They were really acting up last night, maybe shedding." Took last ATB at noon. States "Please tell the doctor this is really helping and I want more, want to knock it out." Please advise Reason for Disposition  [1] Caller has URGENT medicine question about med that PCP or specialist prescribed AND [2] triager unable to answer question  Answer Assessment - Initial Assessment Questions 1. NAME of MEDICINE: "What medicine(s) are you calling about?"     ATB and Mupirocin 2. QUESTION: "What is your question?" (e.g., double dose of medicine, side effect)     Need more 3. PRESCRIBER: "Who prescribed the medicine?" Reason: if prescribed by specialist, call should be referred to that group.     PCP 4. SYMPTOMS: "Do you have any symptoms?" If Yes, ask: "What symptoms are you having?"  "How bad are the symptoms (e.g., mild, moderate, severe)     2 new spots  Protocols used: Medication  Question Call-A-AH

## 2022-12-10 MED ORDER — PERMETHRIN 5 % EX CREA: 1.0000 | TOPICAL_CREAM | Freq: Once | CUTANEOUS | 1 refills | Status: AC

## 2022-12-10 MED ORDER — MUPIROCIN 2 % EX OINT: 1.0000 | TOPICAL_OINTMENT | Freq: Two times a day (BID) | CUTANEOUS | 2 refills | Status: DC

## 2022-12-10 MED ORDER — AMOXICILLIN-POT CLAVULANATE 500-125 MG PO TABS
1.0000 | ORAL_TABLET | Freq: Three times a day (TID) | ORAL | 0 refills | Status: DC
Start: 2022-12-10 — End: 2022-12-30

## 2022-12-10 NOTE — Telephone Encounter (Signed)
I just reviewed PCP notes and she did not think he had Scabies. She referred him to Dermatology for a biopsy. I have sent Augmentin to his Pharmacy.

## 2022-12-10 NOTE — Telephone Encounter (Signed)
I sent a Rx for Permethrin which is used to treat scabies to his pharmacy. Augmentin is not used to treat scabies. If he feels he needs Augmentin please direct him to the mobile unit For evaluation as his PCP is out of the office and I am out sickThanks.

## 2022-12-10 NOTE — Telephone Encounter (Addendum)
Call placed to patient unable to reach unable to leave message as VM not set up.

## 2022-12-10 NOTE — Telephone Encounter (Addendum)
Spoke with patient. patient said that he missed his appointment with Dermatology. Stress the importance of follow-up with Dermatology. Advised patient that PCP advised that they did not think he had scabies. Patient advised that there were medications sent to his pharmacy. Contact number of Dermatology given to patient and reinforced importance of rescheduling his appointment. Patient voiced understanding.

## 2022-12-17 ENCOUNTER — Telehealth: Payer: Self-pay | Admitting: Nurse Practitioner

## 2022-12-17 ENCOUNTER — Other Ambulatory Visit: Payer: Self-pay | Admitting: Family Medicine

## 2022-12-17 ENCOUNTER — Ambulatory Visit: Payer: Self-pay | Admitting: *Deleted

## 2022-12-17 DIAGNOSIS — L309 Dermatitis, unspecified: Secondary | ICD-10-CM

## 2022-12-17 DIAGNOSIS — L303 Infective dermatitis: Secondary | ICD-10-CM

## 2022-12-17 NOTE — Telephone Encounter (Signed)
Summary: Scabies not geting better   Patient states the the scabies is still not getting better. Please advise.      Chief Complaint: Scabies Symptoms: "Went away for a while on Augmentin, (8/28) Came back, "Shedding."  Frequency: Ongoing Pertinent Negatives: Patient denies  Disposition: [] ED /[] Urgent Care (no appt availability in office) / [] Appointment(In office/virtual)/ []  [facility]/ [] Home Care/ [] Refused Recommended Disposition /[] [facility]/ [x]  Follow-up with PCP Additional Notes:   Reviewed all previous messages regarding meds and diagnosis. States he completed course of Augmentin Rx from 12/10/22, requesting more. Also reports dermatologist he got in touch with does not do biopsies. Did advise Mobile [facility], "They told me not to come back, to see my doctor."   Please advise. Reason for Disposition  [1] Caller has NON-URGENT medicine question about med that PCP prescribed AND [2] triager unable to answer question  Answer Assessment - Initial Assessment Questions 1. NAME of MEDICINE: "What medicine(s) are you calling about?"     Augmentin. 2. QUESTION: "What is your question?" (e.g., double dose of medicine, side effect)     Would like more.  Protocols used: Medication Question Call-A-AH

## 2022-12-17 NOTE — Telephone Encounter (Signed)
iNSTRUCTED Joshua [name] that he would need to keep his appt with Dr Laural Benes on the 17th however he needs to follow up with Dermatology as soon as possible. He states the dermatologist office told him they will not do a biopsy on his skin. He is requesting an addtional round of abx which I have declined to prescribe tonight. He states he is spitting out and urinating scabies and he needs the antibiotics to treat this. I recommended if he wanted to be seen sooner than the 17th he would need to call the office during regular business hours and request an appt with the mobile until

## 2022-12-18 NOTE — Telephone Encounter (Signed)
Noted  

## 2022-12-18 NOTE — Telephone Encounter (Signed)
Called patient to inform of message from Dr. Laural Benes. Made aware of referral to Derm and ID.  Reminded of appts with Dr. Laural Benes and Cardiology.

## 2022-12-22 NOTE — Telephone Encounter (Signed)
Called & spoke to the patient. Verified name & DOB. Infomed patient that referral to [facility] has been sent. Patient expressed verbal understanding. Reminded patient to come in to the lab for additional blood work. Patient stated that he is aware and will come in office for blood work. Declined to make an appointment. No further questions at this time.

## 2022-12-30 ENCOUNTER — Ambulatory Visit: Payer: Medicaid Other | Attending: Internal Medicine | Admitting: Internal Medicine

## 2022-12-30 ENCOUNTER — Encounter: Payer: Self-pay | Admitting: Internal Medicine

## 2022-12-30 VITALS — BP 115/72 | HR 63 | Ht 73.0 in | Wt 227.0 lb

## 2022-12-30 DIAGNOSIS — L309 Dermatitis, unspecified: Secondary | ICD-10-CM | POA: Diagnosis not present

## 2022-12-30 DIAGNOSIS — Z23 Encounter for immunization: Secondary | ICD-10-CM | POA: Diagnosis not present

## 2022-12-30 DIAGNOSIS — I2510 Atherosclerotic heart disease of native coronary artery without angina pectoris: Secondary | ICD-10-CM

## 2022-12-30 DIAGNOSIS — I10 Essential (primary) hypertension: Secondary | ICD-10-CM

## 2022-12-30 MED ORDER — PREDNISONE 20 MG PO TABS: ORAL_TABLET | ORAL | 0 refills | Status: DC

## 2022-12-30 MED ORDER — POTASSIUM CHLORIDE ER 10 MEQ PO TBCR
10.0000 meq | EXTENDED_RELEASE_TABLET | Freq: Every day | ORAL | 1 refills | Status: DC
Start: 2022-12-30 — End: 2023-01-07

## 2022-12-30 MED ORDER — AMLODIPINE BESYLATE 5 MG PO TABS
5.0000 mg | ORAL_TABLET | Freq: Every day | ORAL | 1 refills | Status: DC
Start: 2022-12-30 — End: 2023-01-07

## 2022-12-30 NOTE — Progress Notes (Signed)
Patient ID: KOHLTON [name], male    DOB: 09-Mar-1961  MRN: [id number]  CC: Dermatitis (Dermatitis f/u - reports no changes. Med refill - robaxin/Requesting medication to help with dermatitis on R knee/Flu vax administered 12/30/22 - C.A.)   Subjective: Joshua [name] is a 62 y.o. male who presents for chronic ds management. His concerns today include:  Patient with history of HTN, OA BL hips s/p TKR, prediabetes, CAD [with stent to LAD, RCA], EF 55-60%%, bradycardia , bicuspid aortic valve, mod AS, mild dilated ascending aorta   Visit 12/04/2022: Patient presents today requesting antibiotics for scabies.  He has some lesions on his right thigh that was noted on last visit with me 10/23/2022 when he was sent to the emergency room for abscess of the left hand.  One of them became larger and more red over the weekend with some drainage.  He had 4 amoxicillin on hand and took all 4 of them.  States that it decreased in size, redness decreased and no longer drains.  He thinks all of the lesions are due to scabies and has been using scabies cream which he states helps a little.  He has small area on the left thigh and had 1 on the crown of his head that has healed over.  He tells me this all started over the weekend but I see from my last office visit, I made note On physical exam that he had several scabbed over lesions on the right thigh.  He has not had any recent long distance travel or travel outside of the [place].  Denies any fever.  Patient was prescribed Augmentin antibiotics.  Since then, he called back requesting more antibiotics from Dr. Alvis Lemmings in my absence which was given.  Today: Patient reports that Abx helped but "did not kill it." Still has a red spot on the medial aspect of the right knee. Referred to dermatology on last visit.  It was sent to [facility] medical dermatology.  Patient thinks he was called and was told that if it is an infectious dermatitis, they do not deal with that.   He has an appointment scheduled with infectious disease later this week.  He has not gotten in with wound care as yet.   HTN/CAD:  compliant with Norvasc 5 mg, Coreg 3.125 mg BID, Lisinopril 10 mg daily and Repatha Q2wks (on it x 1 yr) Checks BP occasionally Denies any chest pains, shortness of breath, lower extremity edema  Patient Active Problem List   Diagnosis Date Noted   Nontraumatic complete tear of left rotator cuff 07/21/2022   Biceps tendinitis of right upper extremity    Rotator cuff syndrome of right shoulder 10/16/2021   Other insomnia 04/30/2021   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   COVID-19 vaccine regimen to maintain immunity completed 04/27/2020   Statin intolerance 12/10/2018   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/15/2018   Chronic left shoulder pain 06/08/2018   Status post total hip replacement, right 04/26/2018   Mild dilation of ascending aorta (HCC) 12/26/2017   Dyslipidemia 12/26/2017   Bilateral carotid bruits 12/26/2017   Nocturnal hypoxemia 06/29/2017   Primary osteoarthritis of both hips 06/29/2017   Chronic diastolic heart failure (HCC)    Coronary artery disease involving native coronary artery of native heart without angina pectoris 03/31/2017   Ischemic cardiomyopathy 03/31/2017   Marijuana use 03/31/2017   Prediabetes 03/31/2017   Aortic valve insufficiency 12/28/2016   Mitral valve regurgitation 12/28/2016  Obesity 12/28/2016   Aortic valve stenosis, nonrheumatic    Essential hypertension      Current Outpatient Medications on File Prior to Visit  Medication Sig Dispense Refill   amLODipine (NORVASC) 5 MG tablet Take 1 tablet (5 mg total) by mouth daily. Please make a PCP appointment for more refills. 30 tablet 0   amoxicillin-clavulanate (AUGMENTIN) 500-125 MG tablet Take 1 tablet by mouth 3 (three) times daily. 15 tablet 0   aspirin EC 325 MG tablet Take 1 tablet (325 mg total) by mouth daily. 30 tablet 0   carvedilol  (COREG) 3.125 MG tablet TAKE 1 TABLET BY MOUTH 2 TIMES DAILY. 180 tablet 0   Evolocumab (REPATHA SURECLICK) 140 MG/ML SOAJ INJECT 1 ML INTO THE SKIN EVERY 14 (FOURTEEN) DAYS. 6 mL 3   fluticasone (FLONASE) 50 MCG/ACT nasal spray PLACE 1 SPRAY INTO BOTH NOSTRILS DAILY AS NEEDED FOR ALLERGIES OR RHINITIS. 48 mL 0   lisinopril (ZESTRIL) 10 MG tablet Take 1 tablet (10 mg total) by mouth 2 (two) times daily. 180 tablet 1   loratadine (CLARITIN) 10 MG tablet Take 10 mg by mouth daily as needed for allergies.     methocarbamol (ROBAXIN) 500 MG tablet Take 1 tablet (500 mg total) by mouth 2 (two) times daily as needed for muscle spasms. 20 tablet 2   mupirocin ointment (BACTROBAN) 2 % Apply 1 Application topically 2 (two) times daily. 22 g 2   oxyCODONE (ROXICODONE) 5 MG immediate release tablet Take 1 tablet (5 mg total) by mouth every 4 (four) hours as needed for severe pain or breakthrough pain. 30 tablet 0   potassium chloride (KLOR-CON) 10 MEQ tablet Take 1 tablet (10 mEq total) by mouth daily. Please make PCP appointment. 30 tablet 0   No current facility-administered medications on file prior to visit.    Allergies  Allergen Reactions   Statins Other (See Comments)    Elevated LFTs    Social History   Socioeconomic History   Marital status: Single    Spouse name: Not on file   Number of children: 0   Years of education: Not on file   Highest education level: Not on file  Occupational History   Occupation: Retired-Upholstery  Tobacco Use   Smoking status: Former    Current packs/day: 0.00    Types: Cigarettes    Quit date: 08/18/2000    Years since quitting: 22.3    Passive exposure: Past   Smokeless tobacco: Never   Tobacco comments:    Smoked lightly for approx 20 years, quit 1990s  Vaping Use   Vaping status: Never Used  Substance and Sexual Activity   Alcohol use: Yes    Comment: occasionally   Drug use: Yes    Types: Marijuana    Comment: every night (to sleep)    Sexual activity: Not on file  Other Topics Concern   Not on file  Social History Narrative   Lives Home alone. Sister makes all health related decisions. Declined Advanced directive at this time   Social Determinants of Health   Financial Resource Strain: Not on file  Food Insecurity: Not on file  Transportation Needs: Not on file  Physical Activity: Not on file  Stress: Not on file  Social Connections: Not on file  Intimate Partner Violence: Not on file    Family History  Problem Relation Age of Onset   CAD Father        MI/CABG age 63    Past Surgical  History:  Procedure Laterality Date   CORONARY STENT INTERVENTION N/A 12/29/2016   Procedure: CORONARY STENT INTERVENTION;  Surgeon: Corky Crafts, MD;  Location: [facility] INVASIVE CV LAB;  Service: Cardiovascular;  Laterality: N/A;   RIGHT/LEFT HEART CATH AND CORONARY ANGIOGRAPHY N/A 12/29/2016   Procedure: RIGHT/LEFT HEART CATH AND CORONARY ANGIOGRAPHY;  Surgeon: Corky Crafts, MD;  Location: [facility] INVASIVE CV LAB;  Service: Cardiovascular;  Laterality: N/A;   SHOULDER ARTHROSCOPY WITH ROTATOR CUFF REPAIR AND OPEN BICEPS TENODESIS Right 11/28/2021   Procedure: RIGHT SHOULDER ARTHROSCOPY WITH ROTATOR CUFF REPAIR AND OPEN BICEPS TENODESIS;  Surgeon: Huel Cote, MD;  Location: [facility] OR;  Service: Orthopedics;  Laterality: Right;   SHOULDER ARTHROSCOPY WITH ROTATOR CUFF REPAIR AND OPEN BICEPS TENODESIS Left 07/21/2022   Procedure: LEFT SHOULDER ARTHROSCOPY WITH ROTATOR CUFF REPAIR AND BICEPS TENODESIS;  Surgeon: Huel Cote, MD;  Location: [facility] OR;  Service: Orthopedics;  Laterality: Left;   TONSILLECTOMY     TOTAL HIP ARTHROPLASTY Right 04/26/2018   Procedure: RIGHT TOTAL HIP ARTHROPLASTY ANTERIOR APPROACH;  Surgeon: Tarry Kos, MD;  Location: [facility] OR;  Service: Orthopedics;  Laterality: Right;   TOTAL HIP ARTHROPLASTY Left 11/15/2018   TOTAL HIP ARTHROPLASTY Left 11/15/2018   Procedure: LEFT TOTAL HIP ARTHROPLASTY ANTERIOR  APPROACH;  Surgeon: Tarry Kos, MD;  Location: [facility] OR;  Service: Orthopedics;  Laterality: Left;    ROS: Review of Systems Negative except as stated above  PHYSICAL EXAM: BP 115/72 (BP Location: Left Arm, Patient Position: Sitting, Cuff Size: Normal)   Pulse 63   Ht 6\' 1"  (1.854 m)   Wt 227 lb (103 kg)   SpO2 96%   BMI 29.95 kg/m   Physical Exam  General appearance - alert, well appearing, and in no distress Mental status - normal mood, behavior, speech, dress, motor activity, and thought processes Chest - clear to auscultation, no wheezes, rales or rhonchi, symmetric air entry Heart - normal rate, regular rhythm, normal S1, S2, no murmurs, rubs, clicks or gallops Extremities - peripheral pulses normal, no pedal edema, no clubbing or cyanosis Skin -lesions seen on the right thigh and left thigh on last visit have dried up with scabs.  The 1 lesion on the medial aspect of the right knee is still there but erythema has decreased.  It is crusted over in the center.  No drainage noted.           Latest Ref Rng & Units 07/21/2022   10:59 AM 11/28/2021   11:00 AM 09/10/2021   11:32 AM  CMP  Glucose 70 - 99 mg/dL 427  062    BUN 8 - 23 mg/dL 19  13    Creatinine 3.76 - 1.24 mg/dL 2.83  1.51    Sodium 761 - 145 mmol/L 137  139    Potassium 3.5 - 5.1 mmol/L 3.9  4.1    Chloride 98 - 111 mmol/L 105  106    CO2 22 - 32 mmol/L 24  25    Calcium 8.9 - 10.3 mg/dL 8.5  9.4    Total Protein 6.0 - 8.5 g/dL   6.9   Total Bilirubin 0.0 - 1.2 mg/dL   0.4   Alkaline Phos 44 - 121 IU/L   142   AST 0 - 40 IU/L   21   ALT 0 - 44 IU/L   58    Lipid Panel     Component Value Date/Time   CHOL 114 09/10/2021 1132   TRIG 167 (  H) 09/10/2021 1132   HDL 34 (L) 09/10/2021 1132   CHOLHDL 3.4 09/10/2021 1132   CHOLHDL 4.6 12/27/2016 0238   VLDL 18 12/27/2016 0238   LDLCALC 52 09/10/2021 1132    CBC    Component Value Date/Time   WBC 6.7 07/21/2022 1059   RBC 4.77 07/21/2022 1059   HGB  14.8 07/21/2022 1059   HGB 17.4 12/28/2020 0953   HCT 42.6 07/21/2022 1059   HCT 51.2 (H) 12/28/2020 0953   PLT 158 07/21/2022 1059   PLT 184 12/28/2020 0953   MCV 89.3 07/21/2022 1059   MCV 94 12/28/2020 0953   MCH 31.0 07/21/2022 1059   MCHC 34.7 07/21/2022 1059   RDW 13.2 07/21/2022 1059   RDW 13.3 12/28/2020 0953   LYMPHSABS 1.8 09/29/2019 1420   MONOABS 0.6 11/22/2018 1520   EOSABS 0.0 09/29/2019 1420   BASOSABS 0.0 09/29/2019 1420    ASSESSMENT AND PLAN: 1. Dermatitis of lower extremity I still think that a biopsy is needed.  Message sent to our referral coordinator inquiring about the dermatology referral.  Advised to keep appointment with ID later this week.  I will try him with a short course of prednisone. - CBC With Diff/Platelet - HIV antibody (with reflex) - C-reactive protein - Sedimentation Rate - Cryoglobulin  2. Essential hypertension At goal.  Continue carvedilol 3.125 mg twice a day, lisinopril 10 mg daily and amlodipine 5 mg daily. - amLODipine (NORVASC) 5 MG tablet; Take 1 tablet (5 mg total) by mouth daily. Please make a PCP appointment for more refills.  Dispense: 90 tablet; Refill: 1 - potassium chloride (KLOR-CON) 10 MEQ tablet; Take 1 tablet (10 mEq total) by mouth daily. Please make PCP appointment.  Dispense: 90 tablet; Refill: 1 - Comprehensive metabolic panel  3. Coronary artery disease involving native coronary artery of native heart without angina pectoris Stable.  Continue aspirin, carvedilol.  He gets Repatha through his cardiologist.  4. Encounter for immunization - Flu vaccine trivalent PF, 6mos and older(Flulaval,Afluria,Fluarix,Fluzone)  Patient was given the opportunity to ask questions.  Patient verbalized understanding of the plan and was able to repeat key elements of the plan.   This documentation was completed using Paediatric nurse.  Any transcriptional errors are unintentional.  Orders Placed This Encounter   Procedures   Flu vaccine trivalent PF, 6mos and older(Flulaval,Afluria,Fluarix,Fluzone)     Requested Prescriptions   Pending Prescriptions Disp Refills   amLODipine (NORVASC) 5 MG tablet 90 tablet 1    Sig: Take 1 tablet (5 mg total) by mouth daily. Please make a PCP appointment for more refills.   potassium chloride (KLOR-CON) 10 MEQ tablet 90 tablet 1    Sig: Take 1 tablet (10 mEq total) by mouth daily. Please make PCP appointment.    No follow-ups on file.  [name] Blue, MD, FACP

## 2023-01-01 ENCOUNTER — Ambulatory Visit: Payer: Medicaid Other | Attending: Internal Medicine

## 2023-01-01 ENCOUNTER — Ambulatory Visit: Payer: Medicaid Other | Admitting: Internal Medicine

## 2023-01-01 DIAGNOSIS — L309 Dermatitis, unspecified: Secondary | ICD-10-CM | POA: Diagnosis not present

## 2023-01-01 DIAGNOSIS — I10 Essential (primary) hypertension: Secondary | ICD-10-CM | POA: Diagnosis not present

## 2023-01-01 NOTE — Progress Notes (Deleted)
Patient: Joshua [name]  DOB: [birth year] MRN: [id number] PCP: Marcine Matar, MD  Referring Provider: ***  No chief complaint on file.    Patient Active Problem List   Diagnosis Date Noted   Nontraumatic complete tear of left rotator cuff 07/21/2022   Biceps tendinitis of right upper extremity    Rotator cuff syndrome of right shoulder 10/16/2021   Other insomnia 04/30/2021   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   COVID-19 vaccine regimen to maintain immunity completed 04/27/2020   Statin intolerance 12/10/2018   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/15/2018   Chronic left shoulder pain 06/08/2018   Status post total hip replacement, right 04/26/2018   Mild dilation of ascending aorta (HCC) 12/26/2017   Dyslipidemia 12/26/2017   Bilateral carotid bruits 12/26/2017   Nocturnal hypoxemia 06/29/2017   Primary osteoarthritis of both hips 06/29/2017   Chronic diastolic heart failure (HCC)    Coronary artery disease involving native coronary artery of native heart without angina pectoris 03/31/2017   Ischemic cardiomyopathy 03/31/2017   Marijuana use 03/31/2017   Prediabetes 03/31/2017   Aortic valve insufficiency 12/28/2016   Mitral valve regurgitation 12/28/2016   Obesity 12/28/2016   Aortic valve stenosis, nonrheumatic    Essential hypertension      Subjective:  Joshua [name] is a 62 y.o. M ROS  Past Medical History:  Diagnosis Date   Aortic stenosis    CAD (coronary artery disease), native coronary artery 12/26/2016   DES LAD & RCA, EF 25-30%   CHF (congestive heart failure) (HCC)    Dyslipidemia    Hypertension    Obesity    Osteoarthritis    Pre-diabetes    a. prior A1C 5.6, states he was on meds for this at one point.    Outpatient Medications Prior to Visit  Medication Sig Dispense Refill   amLODipine (NORVASC) 5 MG tablet Take 1 tablet (5 mg total) by mouth daily. Please make a PCP appointment for more refills. 90 tablet  1   aspirin EC 325 MG tablet Take 1 tablet (325 mg total) by mouth daily. 30 tablet 0   carvedilol (COREG) 3.125 MG tablet TAKE 1 TABLET BY MOUTH 2 TIMES DAILY. 180 tablet 0   Evolocumab (REPATHA SURECLICK) 140 MG/ML SOAJ INJECT 1 ML INTO THE SKIN EVERY 14 (FOURTEEN) DAYS. 6 mL 3   fluticasone (FLONASE) 50 MCG/ACT nasal spray PLACE 1 SPRAY INTO BOTH NOSTRILS DAILY AS NEEDED FOR ALLERGIES OR RHINITIS. 48 mL 0   lisinopril (ZESTRIL) 10 MG tablet Take 1 tablet (10 mg total) by mouth 2 (two) times daily. 180 tablet 1   loratadine (CLARITIN) 10 MG tablet Take 10 mg by mouth daily as needed for allergies.     methocarbamol (ROBAXIN) 500 MG tablet Take 1 tablet (500 mg total) by mouth 2 (two) times daily as needed for muscle spasms. 20 tablet 2   mupirocin ointment (BACTROBAN) 2 % Apply 1 Application topically 2 (two) times daily. 22 g 2   oxyCODONE (ROXICODONE) 5 MG immediate release tablet Take 1 tablet (5 mg total) by mouth every 4 (four) hours as needed for severe pain or breakthrough pain. 30 tablet 0   potassium chloride (KLOR-CON) 10 MEQ tablet Take 1 tablet (10 mEq total) by mouth daily. Please make PCP appointment. 90 tablet 1   predniSONE (DELTASONE) 20 MG tablet 1  tab PO daily x 5 days then 1/2 tab PO daily x 4 7 tablet 0  No facility-administered medications prior to visit.     Allergies  Allergen Reactions   Statins Other (See Comments)    Elevated LFTs    Social History   Tobacco Use   Smoking status: Former    Current packs/day: 0.00    Types: Cigarettes    Quit date: 08/18/2000    Years since quitting: 22.3    Passive exposure: Past   Smokeless tobacco: Never   Tobacco comments:    Smoked lightly for approx 20 years, quit 1990s  Vaping Use   Vaping status: Never Used  Substance Use Topics   Alcohol use: Yes    Comment: occasionally   Drug use: Yes    Types: Marijuana    Comment: every night (to sleep)    Family History  Problem Relation Age of Onset   CAD Father         MI/CABG age 48    Objective:  There were no vitals filed for this visit. There is no height or weight on file to calculate BMI.  Physical Exam  Lab Results: Lab Results  Component Value Date   WBC 6.7 07/21/2022   HGB 14.8 07/21/2022   HCT 42.6 07/21/2022   MCV 89.3 07/21/2022   PLT 158 07/21/2022    Lab Results  Component Value Date   CREATININE 0.79 07/21/2022   BUN 19 07/21/2022   NA 137 07/21/2022   K 3.9 07/21/2022   CL 105 07/21/2022   CO2 24 07/21/2022    Lab Results  Component Value Date   ALT 58 (H) 09/10/2021   AST 21 09/10/2021   ALKPHOS 142 (H) 09/10/2021   BILITOT 0.4 09/10/2021     Assessment & Plan:    Danelle Earthly, MD [facility]   01/01/23  10:58 AM

## 2023-01-02 ENCOUNTER — Ambulatory Visit: Payer: Medicaid Other | Attending: Cardiovascular Disease | Admitting: Cardiovascular Disease

## 2023-01-02 ENCOUNTER — Encounter: Payer: Self-pay | Admitting: Cardiovascular Disease

## 2023-01-02 VITALS — BP 124/72 | HR 75 | Ht 73.0 in | Wt 226.0 lb

## 2023-01-02 DIAGNOSIS — I10 Essential (primary) hypertension: Secondary | ICD-10-CM

## 2023-01-02 DIAGNOSIS — E785 Hyperlipidemia, unspecified: Secondary | ICD-10-CM

## 2023-01-02 DIAGNOSIS — I350 Nonrheumatic aortic (valve) stenosis: Secondary | ICD-10-CM

## 2023-01-02 DIAGNOSIS — I2510 Atherosclerotic heart disease of native coronary artery without angina pectoris: Secondary | ICD-10-CM | POA: Diagnosis not present

## 2023-01-02 DIAGNOSIS — I5032 Chronic diastolic (congestive) heart failure: Secondary | ICD-10-CM

## 2023-01-02 MED ORDER — AMOXICILLIN 500 MG PO TABS
2000.0000 mg | ORAL_TABLET | Freq: Once | ORAL | Status: DC | PRN
Start: 2023-01-02 — End: 2023-01-30

## 2023-01-02 NOTE — Patient Instructions (Signed)
Medication Instructions:  No changes *If you need a refill on your cardiac medications before your next appointment, please call your pharmacy*   Lab Work: Lipid panel- today If you have labs (blood work) drawn today and your tests are completely normal, you will receive your results only by: MyChart Message (if you have MyChart) OR A paper copy in the mail If you have any lab test that is abnormal or we need to change your treatment, we will call you to review the results.   Follow-Up: At [facility], you and your health needs are our priority.  As part of our continuing mission to provide you with exceptional heart care, we have created designated Provider Care Teams.  These Care Teams include your primary Cardiologist (physician) and Advanced Practice Providers (APPs -  Physician Assistants and Nurse Practitioners) who all work together to provide you with the care you need, when you need it.  We recommend signing up for the patient portal called "MyChart".  Sign up information is provided on this After Visit Summary.  MyChart is used to connect with patients for Virtual Visits (Telemedicine).  Patients are able to view lab/test results, encounter notes, upcoming appointments, etc.  Non-urgent messages can be sent to your provider as well.   To learn more about what you can do with MyChart, go to [email or web address].    Your next appointment:    May 2025  Provider:   Thurmon Fair, MD

## 2023-01-02 NOTE — Progress Notes (Signed)
Cardiology Office Note:    Date:  01/02/2023   ID:  DRAELYN SERVICE, DOB 07/11/1960, MRN [id number]  PCP:  Marcine Matar, MD  Cardiologist:  Thurmon Fair, MD   Referring MD: Marcine Matar, MD   Chief Complaint  Patient presents with   Aortic Stenosis    History of Present Illness:    Joshua [name] is a 62 y.o. male who presented with decompensated heart failure and severe cardiomyopathy (EF 25-30 %) in September 2018 and was identified as having severe 2 vessel coronary artery disease, with drug-eluting stents placed to the LAD and RCA. Following revascularization he has had remarkable improvement in LV systolic function which is now normal (EF 55-60%). He has a bicuspid aortic valve, severe aortic stenosis (per echo 07/16/2022 mean gradient 42 mmHg, dimensionless index 0.2) and very mild aortic root enlargement (estimated to be 39 mm by echo 2023, but normal at 36 mm on the current echo).  He is scheduled to see Dr. Clifton James in clinic in November to discuss timing of AVR, after having another echocardiogram performed on 02/09/2023.  He continues to be asymptomatic.  He mows his own lawn.  He has not had any exertional angina/dyspnea/syncope.  He has managed to keep off the weight that he lost last year and his BMI is just under 30.  Lipid parameters have not been rechecked since last May and we will check those today.  He is on Repatha due to history of elevated liver function test on statins, recurred on rechallenge with atorvastatin.  His blood pressure is well-controlled (He has not tolerated higher doses of carvedilol due to bradycardia, BP is better when he takes lisinopril as a twice daily prescription).  Past Medical History:  Diagnosis Date   Aortic stenosis    CAD (coronary artery disease), native coronary artery 12/26/2016   DES LAD & RCA, EF 25-30%   CHF (congestive heart failure) (HCC)    Dyslipidemia    Hypertension    Obesity    Osteoarthritis     Pre-diabetes    a. prior A1C 5.6, states he was on meds for this at one point.    Past Surgical History:  Procedure Laterality Date   CORONARY STENT INTERVENTION N/A 12/29/2016   Procedure: CORONARY STENT INTERVENTION;  Surgeon: Corky Crafts, MD;  Location: [facility] INVASIVE CV LAB;  Service: Cardiovascular;  Laterality: N/A;   RIGHT/LEFT HEART CATH AND CORONARY ANGIOGRAPHY N/A 12/29/2016   Procedure: RIGHT/LEFT HEART CATH AND CORONARY ANGIOGRAPHY;  Surgeon: Corky Crafts, MD;  Location: [facility] INVASIVE CV LAB;  Service: Cardiovascular;  Laterality: N/A;   SHOULDER ARTHROSCOPY WITH ROTATOR CUFF REPAIR AND OPEN BICEPS TENODESIS Right 11/28/2021   Procedure: RIGHT SHOULDER ARTHROSCOPY WITH ROTATOR CUFF REPAIR AND OPEN BICEPS TENODESIS;  Surgeon: Huel Cote, MD;  Location: [facility] OR;  Service: Orthopedics;  Laterality: Right;   SHOULDER ARTHROSCOPY WITH ROTATOR CUFF REPAIR AND OPEN BICEPS TENODESIS Left 07/21/2022   Procedure: LEFT SHOULDER ARTHROSCOPY WITH ROTATOR CUFF REPAIR AND BICEPS TENODESIS;  Surgeon: Huel Cote, MD;  Location: [facility] OR;  Service: Orthopedics;  Laterality: Left;   TONSILLECTOMY     TOTAL HIP ARTHROPLASTY Right 04/26/2018   Procedure: RIGHT TOTAL HIP ARTHROPLASTY ANTERIOR APPROACH;  Surgeon: Tarry Kos, MD;  Location: [facility] OR;  Service: Orthopedics;  Laterality: Right;   TOTAL HIP ARTHROPLASTY Left 11/15/2018   TOTAL HIP ARTHROPLASTY Left 11/15/2018   Procedure: LEFT TOTAL HIP ARTHROPLASTY ANTERIOR APPROACH;  Surgeon: Tarry Kos, MD;  Location: [facility] OR;  Service: Orthopedics;  Laterality: Left;    Current Medications: Current Meds  Medication Sig   amLODipine (NORVASC) 5 MG tablet Take 1 tablet (5 mg total) by mouth daily. Please make a PCP appointment for more refills.   ASPIRIN 81 PO Take 1 tablet by mouth daily.   carvedilol (COREG) 3.125 MG tablet TAKE 1 TABLET BY MOUTH 2 TIMES DAILY.   Evolocumab (REPATHA SURECLICK) 140 MG/ML SOAJ INJECT 1 ML INTO THE SKIN EVERY  14 (FOURTEEN) DAYS.   fluticasone (FLONASE) 50 MCG/ACT nasal spray PLACE 1 SPRAY INTO BOTH NOSTRILS DAILY AS NEEDED FOR ALLERGIES OR RHINITIS.   lisinopril (ZESTRIL) 10 MG tablet Take 1 tablet (10 mg total) by mouth 2 (two) times daily.   loratadine (CLARITIN) 10 MG tablet Take 10 mg by mouth daily as needed for allergies.   methocarbamol (ROBAXIN) 500 MG tablet Take 1 tablet (500 mg total) by mouth 2 (two) times daily as needed for muscle spasms.   mupirocin cream (BACTROBAN) 2 % Apply 1 Application topically daily.   mupirocin ointment (BACTROBAN) 2 % Apply 1 Application topically 2 (two) times daily.   oxyCODONE (ROXICODONE) 5 MG immediate release tablet Take 1 tablet (5 mg total) by mouth every 4 (four) hours as needed for severe pain or breakthrough pain.   potassium chloride (KLOR-CON) 10 MEQ tablet Take 1 tablet (10 mEq total) by mouth daily. Please make PCP appointment.   predniSONE (DELTASONE) 20 MG tablet 1  tab PO daily x 5 days then 1/2 tab PO daily x 4     Allergies:   Statins   Social History   Socioeconomic History   Marital status: Single    Spouse name: Not on file   Number of children: 0   Years of education: Not on file   Highest education level: Not on file  Occupational History   Occupation: Retired-Upholstery  Tobacco Use   Smoking status: Former    Current packs/day: 0.00    Types: Cigarettes    Quit date: 08/18/2000    Years since quitting: 22.3    Passive exposure: Past   Smokeless tobacco: Never   Tobacco comments:    Smoked lightly for approx 20 years, quit 1990s  Vaping Use   Vaping status: Never Used  Substance and Sexual Activity   Alcohol use: Yes    Comment: occasionally   Drug use: Yes    Types: Marijuana    Comment: every night (to sleep)   Sexual activity: Not on file  Other Topics Concern   Not on file  Social History Narrative   Lives Home alone. Sister makes all health related decisions. Declined Advanced directive at this time    Social Determinants of Health   Financial Resource Strain: Not on file  Food Insecurity: Not on file  Transportation Needs: Not on file  Physical Activity: Not on file  Stress: Not on file  Social Connections: Not on file     Family History: The patient's family history includes CAD in his father.  ROS:   Please see the history of present illness.    All other systems are reviewed and are negative.   EKGs/Labs/Other Studies Reviewed:    EKG:  EKG is ordered today.  It shows sinus rhythm with frequent PVCs in a pattern of trigeminy, borderline first-degree AV block (PR interval 200 ms), nonspecific minor IVCD (QRS 160 ms) now with borderline left axis deviation again (this has been present on and off on  previous ECGs).  QTc 466 ms  ECHO 07/16/2022:    1. The aortic valve is known bicuspid. There is severe calcifcation of the aortic valve. There is severe thickening of the aortic valve. Aortic valve regurgitation is mild. Severe aortic valve stenosis. Aortic valve mean gradient measures 42.0 mmHg. Aortic valve Vmax measures 4.00 m/s, AVA 0.61cm2, DI 0.2.   2. Left ventricular ejection fraction, by estimation, is 55 to 60%. The left ventricle has normal function. The left ventricle has no regional wall motion abnormalities. There is mild concentric left ventricular hypertrophy. Left ventricular diastolic parameters are consistent with Grade I diastolic dysfunction (impaired relaxation). The average left ventricular global longitudinal strain is -13.4 %. The global longitudinal strain is abnormal.   3. Right ventricular systolic function is normal. The right ventricular size is normal.   4. Left atrial size was mildly dilated.   5. The mitral valve is normal in structure. Trivial mitral valve regurgitation. No evidence of mitral stenosis.   6. The inferior vena cava is normal in size with greater than 50% respiratory variability, suggesting right atrial pressure of 3 mmHg.    Comparison(s): Previous echo 08/08/2021 aortic valve area by VTI measures 1.24cm. Aortic valve mean gradient measures 25.9 mmghg.   AV Area (VTI):     0.61 cm  AV Vmax:           400.00 cm/s  AV Peak Grad:      64.0 mmHg  LVOT/AV VTI ratio: 0.18  AI PHT:            351 msec   Recent Labs: 01/01/2023: ALT 22; BUN 20; Creatinine, Ser 0.90; Hemoglobin 15.9; Platelets 153; Potassium 4.9; Sodium 146  Recent Lipid Panel    Component Value Date/Time   CHOL 114 09/10/2021 1132   TRIG 167 (H) 09/10/2021 1132   HDL 34 (L) 09/10/2021 1132   CHOLHDL 3.4 09/10/2021 1132   CHOLHDL 4.6 12/27/2016 0238   VLDL 18 12/27/2016 0238   LDLCALC 52 09/10/2021 1132    Physical Exam:    VS:  BP 124/72 (BP Location: Left Arm, Patient Position: Sitting, Cuff Size: Large)   Pulse 75   Ht 6\' 1"  (1.854 m)   Wt 226 lb (102.5 kg)   SpO2 97%   BMI 29.82 kg/m     Wt Readings from Last 3 Encounters:  01/02/23 226 lb (102.5 kg)  12/30/22 227 lb (103 kg)  12/04/22 224 lb 3.2 oz (101.7 kg)      General: Alert, oriented x3, no distress, overweight Head: no evidence of trauma, PERRL, EOMI, no exophtalmos or lid lag, no myxedema, no xanthelasma; normal ears, nose and oropharynx Neck: normal jugular venous pulsations and no hepatojugular reflux; carotid pulses are delayed and he has loud bilateral carotid bruits  Chest: clear to auscultation, no signs of consolidation by percussion or palpation, normal fremitus, symmetrical and full respiratory excursions Cardiovascular: normal position and quality of the apical impulse, regular rhythm, normal first and absent A2 component of the second heart sound, mid peaking 3/6 aortic ejection murmur, no diastolic murmurs, rubs or gallops Abdomen: no tenderness or distention, no masses by palpation, no abnormal pulsatility or arterial bruits, normal bowel sounds, no hepatosplenomegaly Extremities: no clubbing, cyanosis or edema; 2+ radial, ulnar and brachial pulses  bilaterally; 2+ right femoral, posterior tibial and dorsalis pedis pulses; 2+ left femoral, posterior tibial and dorsalis pedis pulses; no subclavian or femoral bruits Neurological: grossly nonfocal Psych: Normal mood and affect   ASSESSMENT:  1. Chronic diastolic heart failure (HCC)   2. Coronary artery disease involving native coronary artery of native heart without angina pectoris   3. Aortic valve stenosis, nonrheumatic   4. Essential hypertension   5. Dyslipidemia (high LDL; low HDL)      PLAN:    In order of problems listed above:  CHF: Remains NYHA functional class I, euvolemic without need for diuretics. Presented with severely reduced LVEF of 25-30% due to ischemic cardiomyopathy, dyspnea rather than angina.  Systolic dysfunction resolved completely after revascularization.   CAD: Asymptomatic (he has never had angina pectoris).  Two-vessel disease treated with percutaneous revascularization in 2018.  No clinical evidence of recurrent disease since then. AS: Has a bicuspid aortic valve that has progressed to severe arctic stenosis and has mild dilation of the ascending aorta.  Remains asymptomatic, but he is likely to have symptoms in the next few months.  He is almost 62 years old and is definitely a candidate for a tissue prosthesis, discussed options for TAVR versus surgical aortic valve replacement.  He will require right and left heart catheterization and coronary angiography and CT angiography of the entire aorta before we decide on the most appropriate approach.   Again asked her to make sure he takes care of all his dental problems before AVR.  He also needs prophylaxis because of his prosthetic hip. Bilateral carotid bruits: Bruits radiating from his aortic valve, no evidence for obstructive lesions on duplex ultrasound September 2019. HTN: Well-controlled.  Does not tolerate higher beta-blocker dose.  Lisinopril works better for him when he takes it twice daily. HLP: Him  to recheck his lipid profile today.  Repeated challenges with statin have led to liver test abnormalities.  Excellent response to Repatha.  LDL well within target of less than 70. Metabolic syndrome: A1c in prediabetes range.  Low HDL, mildly elevated triglycerides. At increased risk of progression of vascular disorders and development of full-blown diabetes.  Needs to try to lose a little more weight and remain physically active. 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  This has not worsened on echo (maximum aortic root diameter 3.9 cm on echo in 2023, but measured at 3.6 cm on echo this year).  We will reevaluate w CT.       Medication Adjustments/Labs and Tests Ordered: Current medicines are reviewed at length with the patient today.  Concerns regarding medicines are outlined above.  Orders Placed This Encounter  Procedures   Lipid panel   EKG 12-Lead   No orders of the defined types were placed in this encounter.   Signed, Thurmon Fair, MD  01/02/2023 9:18 AM    [facility]

## 2023-01-03 LAB — LIPID PANEL
Chol/HDL Ratio: 1.7 ratio (ref 0.0–5.0)
Cholesterol, Total: 103 mg/dL (ref 100–199)
HDL: 62 mg/dL (ref >39)
LDL Chol Calc (NIH): 26 mg/dL (ref 0–99)
Triglycerides: 69 mg/dL (ref 0–149)
VLDL Cholesterol Cal: 15 mg/dL (ref 5–40)

## 2023-01-06 ENCOUNTER — Encounter: Payer: Self-pay | Admitting: Infectious Disease

## 2023-01-06 DIAGNOSIS — L989 Disorder of the skin and subcutaneous tissue, unspecified: Secondary | ICD-10-CM | POA: Insufficient documentation

## 2023-01-06 HISTORY — DX: Disorder of the skin and subcutaneous tissue, unspecified: L98.9

## 2023-01-07 ENCOUNTER — Other Ambulatory Visit: Payer: Self-pay

## 2023-01-07 ENCOUNTER — Ambulatory Visit (INDEPENDENT_AMBULATORY_CARE_PROVIDER_SITE_OTHER): Payer: Medicaid Other | Admitting: Infectious Disease

## 2023-01-07 ENCOUNTER — Encounter: Payer: Self-pay | Admitting: Infectious Disease

## 2023-01-07 ENCOUNTER — Other Ambulatory Visit (HOSPITAL_COMMUNITY)
Admission: RE | Admit: 2023-01-07 | Discharge: 2023-01-07 | Disposition: A | Discharge status: Home / Self Care | Payer: Medicaid Other | Source: Ambulatory Visit | Attending: Internal Medicine | Admitting: Internal Medicine

## 2023-01-07 ENCOUNTER — Telehealth: Payer: Self-pay

## 2023-01-07 VITALS — BP 102/63 | HR 68 | Resp 16 | Ht 73.0 in | Wt 221.0 lb

## 2023-01-07 DIAGNOSIS — L309 Dermatitis, unspecified: Secondary | ICD-10-CM

## 2023-01-07 DIAGNOSIS — I255 Ischemic cardiomyopathy: Secondary | ICD-10-CM | POA: Diagnosis not present

## 2023-01-07 DIAGNOSIS — I10 Essential (primary) hypertension: Secondary | ICD-10-CM

## 2023-01-07 DIAGNOSIS — I5040 Unspecified combined systolic (congestive) and diastolic (congestive) heart failure: Secondary | ICD-10-CM | POA: Diagnosis not present

## 2023-01-07 DIAGNOSIS — L989 Disorder of the skin and subcutaneous tissue, unspecified: Secondary | ICD-10-CM

## 2023-01-07 HISTORY — DX: Dermatitis, unspecified: L30.9

## 2023-01-07 MED ORDER — COVID-19 MRNA VAC-TRIS(PFIZER) 30 MCG/0.3ML IM SUSY
0.3000 mL | PREFILLED_SYRINGE | Freq: Once | INTRAMUSCULAR | 0 refills | Status: AC
Start: 2023-01-07 — End: 2023-01-08
  Filled 2023-01-07: qty 0.3, 1d supply, fill #0

## 2023-01-07 MED ORDER — POTASSIUM CHLORIDE ER 10 MEQ PO TBCR: 10.0000 meq | EXTENDED_RELEASE_TABLET | Freq: Every day | ORAL | 1 refills | Status: DC

## 2023-01-07 MED ORDER — PREDNISONE 20 MG PO TABS: ORAL_TABLET | ORAL | 0 refills | Status: DC

## 2023-01-07 MED ORDER — AMLODIPINE BESYLATE 5 MG PO TABS: 5.0000 mg | ORAL_TABLET | Freq: Every day | ORAL | 1 refills | Status: DC

## 2023-01-07 NOTE — Telephone Encounter (Signed)
Patient came in stating [facility] didn't receive orders for medications on 9/17 due to power outage. Patient requesting for it to be resent.

## 2023-01-07 NOTE — Telephone Encounter (Signed)
Called & spoke to a [facility] staff member. Staff member stated that there was a power outage on 12/30/2022 thereby all prescription orders were not received and prescription order will need to be resent. Prednisone, amlodipine, and potassium [facility] on 01/07/2023. Called patient but no answer. LVM informing that [facility] orders have [facility].

## 2023-01-07 NOTE — Progress Notes (Signed)
Reason for infectious disease consult dermatitis and skin lesions:  Requesting physician [name] Blue, MD  Subjective:      Patient ID: Joshua [name], male    DOB: 1960/07/14, 62 y.o.   MRN: [id number]  HPI  Perkins is a 62 year old man with multiple medical problems including coronary artery disease and ischemic cardiomyopathy who has had lesions on his fingers and lower extremities since June 2024 if not before then that have been treated with multiple courses of  permerthrin.  By my count I see least 5 dispenses.  He also has been given doxycycline earlier in there and more recently Augmentin.  I do not know why scabies was entertained as a diagnosis since lives alone and has had essentially not any physical contact with other human beings over the past several months.  Apparently at 1 point he developed a bacterial superinfection in his hand that required I&D in the ER by ER physician.    His last had relations earlier in January of this year but did not actually have penile vaginal intercourse.  He is tested negative for HIV with recent labs.  He is scheduled to see dermatology.  He did show me something that he collected in the test tube that he found in his bathtub.  This had the appearance of dead skin but he seemed to think it might be a "critter."  He seemed to think that there was also a worm under his skin on left arm at one point in time.  He was prescribed prednisone by his primary care physician but seem to think it was not available at the pharmacy.  He spent some time during the clinic visit calling a pharmacy to check on these prescriptions.      Past Medical History:  Diagnosis Date   Aortic stenosis    CAD (coronary artery disease), native coronary artery 12/26/2016   DES LAD & RCA, EF 25-30%   CHF (congestive heart failure) (HCC)    Dyslipidemia    Hypertension    Obesity    Osteoarthritis    Pre-diabetes    a. prior A1C 5.6, states he  was on meds for this at one point.   Skin lesions 01/06/2023    Past Surgical History:  Procedure Laterality Date   CORONARY STENT INTERVENTION N/A 12/29/2016   Procedure: CORONARY STENT INTERVENTION;  Surgeon: Corky Crafts, MD;  Location: [facility] INVASIVE CV LAB;  Service: Cardiovascular;  Laterality: N/A;   RIGHT/LEFT HEART CATH AND CORONARY ANGIOGRAPHY N/A 12/29/2016   Procedure: RIGHT/LEFT HEART CATH AND CORONARY ANGIOGRAPHY;  Surgeon: Corky Crafts, MD;  Location: [facility] INVASIVE CV LAB;  Service: Cardiovascular;  Laterality: N/A;   SHOULDER ARTHROSCOPY WITH ROTATOR CUFF REPAIR AND OPEN BICEPS TENODESIS Right 11/28/2021   Procedure: RIGHT SHOULDER ARTHROSCOPY WITH ROTATOR CUFF REPAIR AND OPEN BICEPS TENODESIS;  Surgeon: Huel Cote, MD;  Location: [facility] OR;  Service: Orthopedics;  Laterality: Right;   SHOULDER ARTHROSCOPY WITH ROTATOR CUFF REPAIR AND OPEN BICEPS TENODESIS Left 07/21/2022   Procedure: LEFT SHOULDER ARTHROSCOPY WITH ROTATOR CUFF REPAIR AND BICEPS TENODESIS;  Surgeon: Huel Cote, MD;  Location: [facility] OR;  Service: Orthopedics;  Laterality: Left;   TONSILLECTOMY     TOTAL HIP ARTHROPLASTY Right 04/26/2018   Procedure: RIGHT TOTAL HIP ARTHROPLASTY ANTERIOR APPROACH;  Surgeon: Tarry Kos, MD;  Location: [facility] OR;  Service: Orthopedics;  Laterality: Right;   TOTAL HIP ARTHROPLASTY Left 11/15/2018   TOTAL HIP ARTHROPLASTY Left 11/15/2018   Procedure: LEFT  TOTAL HIP ARTHROPLASTY ANTERIOR APPROACH;  Surgeon: Tarry Kos, MD;  Location: [facility] OR;  Service: Orthopedics;  Laterality: Left;    Family History  Problem Relation Age of Onset   CAD Father        MI/CABG age 23      Social History   Socioeconomic History   Marital status: Single    Spouse name: Not on file   Number of children: 0   Years of education: Not on file   Highest education level: Not on file  Occupational History   Occupation: Retired-Upholstery  Tobacco Use   Smoking status: Former    Current  packs/day: 0.00    Types: Cigarettes    Quit date: 08/18/2000    Years since quitting: 22.4    Passive exposure: Past   Smokeless tobacco: Never   Tobacco comments:    Smoked lightly for approx 20 years, quit 1990s  Vaping Use   Vaping status: Never Used  Substance and Sexual Activity   Alcohol use: Yes    Comment: occasionally   Drug use: Yes    Types: Marijuana    Comment: every night (to sleep)   Sexual activity: Not on file  Other Topics Concern   Not on file  Social History Narrative   Lives Home alone. Sister makes all health related decisions. Declined Advanced directive at this time   Social Determinants of Health   Financial Resource Strain: Not on file  Food Insecurity: Not on file  Transportation Needs: Not on file  Physical Activity: Not on file  Stress: Not on file  Social Connections: Not on file    Allergies  Allergen Reactions   Statins Other (See Comments)    Elevated LFTs     Current Outpatient Medications:    amLODipine (NORVASC) 5 MG tablet, Take 1 tablet (5 mg total) by mouth daily. Please make a PCP appointment for more refills., Disp: 90 tablet, Rfl: 1   amoxicillin (AMOXIL) 500 MG tablet, Take 4 tablets (2,000 mg total) by mouth once as needed for up to 1 dose (Take one hour prior to dental procedure)., Disp: , Rfl:    ASPIRIN 81 PO, Take 1 tablet by mouth daily., Disp: , Rfl:    carvedilol (COREG) 3.125 MG tablet, TAKE 1 TABLET BY MOUTH 2 TIMES DAILY., Disp: 180 tablet, Rfl: 0   Evolocumab (REPATHA SURECLICK) 140 MG/ML SOAJ, INJECT 1 ML INTO THE SKIN EVERY 14 (FOURTEEN) DAYS., Disp: 6 mL, Rfl: 3   fluticasone (FLONASE) 50 MCG/ACT nasal spray, PLACE 1 SPRAY INTO BOTH NOSTRILS DAILY AS NEEDED FOR ALLERGIES OR RHINITIS., Disp: 48 mL, Rfl: 0   lisinopril (ZESTRIL) 10 MG tablet, Take 1 tablet (10 mg total) by mouth 2 (two) times daily., Disp: 180 tablet, Rfl: 1   loratadine (CLARITIN) 10 MG tablet, Take 10 mg by mouth daily as needed for allergies.,  Disp: , Rfl:    methocarbamol (ROBAXIN) 500 MG tablet, Take 1 tablet (500 mg total) by mouth 2 (two) times daily as needed for muscle spasms., Disp: 20 tablet, Rfl: 2   mupirocin cream (BACTROBAN) 2 %, Apply 1 Application topically daily., Disp: , Rfl:    mupirocin ointment (BACTROBAN) 2 %, Apply 1 Application topically 2 (two) times daily., Disp: 22 g, Rfl: 2   oxyCODONE (ROXICODONE) 5 MG immediate release tablet, Take 1 tablet (5 mg total) by mouth every 4 (four) hours as needed for severe pain or breakthrough pain., Disp: 30 tablet, Rfl: 0  potassium chloride (KLOR-CON) 10 MEQ tablet, Take 1 tablet (10 mEq total) by mouth daily. Please make PCP appointment., Disp: 90 tablet, Rfl: 1   predniSONE (DELTASONE) 20 MG tablet, 1  tab PO daily x 5 days then 1/2 tab PO daily x 4, Disp: 7 tablet, Rfl: 0    Review of Systems  Constitutional:  Negative for activity change, appetite change, chills, diaphoresis, fatigue, fever and unexpected weight change.  HENT:  Negative for congestion, rhinorrhea, sinus pressure, sneezing, sore throat and trouble swallowing.   Eyes:  Negative for photophobia and visual disturbance.  Respiratory:  Negative for cough, chest tightness, shortness of breath, wheezing and stridor.   Cardiovascular:  Negative for chest pain, palpitations and leg swelling.  Gastrointestinal:  Negative for abdominal distention, abdominal pain, anal bleeding, blood in stool, constipation, diarrhea, nausea and vomiting.  Genitourinary:  Negative for difficulty urinating, dysuria, flank pain and hematuria.  Musculoskeletal:  Negative for arthralgias, back pain, gait problem, joint swelling and myalgias.  Skin:  Positive for rash and wound. Negative for color change and pallor.  Neurological:  Negative for dizziness, tremors, weakness and light-headedness.  Hematological:  Negative for adenopathy. Does not bruise/bleed easily.  Psychiatric/Behavioral:  Negative for agitation, behavioral problems,  confusion, decreased concentration, dysphoric mood and sleep disturbance.        Objective:   Physical Exam Constitutional:      Appearance: He is well-developed.  HENT:     Head: Normocephalic and atraumatic.  Eyes:     Conjunctiva/sclera: Conjunctivae normal.  Cardiovascular:     Rate and Rhythm: Normal rate and regular rhythm.  Pulmonary:     Effort: Pulmonary effort is normal. No respiratory distress.     Breath sounds: No wheezing.  Abdominal:     General: There is no distension.     Palpations: Abdomen is soft.  Musculoskeletal:        General: No tenderness. Normal range of motion.     Cervical back: Normal range of motion and neck supple.  Skin:    General: Skin is warm and dry.     Coloration: Skin is not pale.     Findings: No erythema or rash.  Neurological:     General: No focal deficit present.     Mental Status: He is alert and oriented to person, place, and time.  Psychiatric:        Mood and Affect: Mood normal.        Behavior: Behavior normal.        Thought Content: Thought content normal.        Judgment: Judgment normal.           Assessment & Plan:   Multiple skin lesions:  These are not infectious in etiology.  Certainly nontuberculous mycobacterial skin infections could persist for months and months and be refractory to antibacterial therapy but I do understand why he would have such diffuse pathology and disseminated infection in the absence of immunosuppression.  I will order some autoimmune labs and send off a syphilis test.  He clearly needs to be seen by dermatology.  I do think a trial of steroids would be potentially informative if it helps.  I wonder given some of the things he mentioned if he could have delusional parasitosis and be picking at these areas which are all areas that he would be capable of picking, scratching  Screening for STIs and viral hepatides:  In addition we will screen for gonorrhea chlamydia trichomonas  and  urine.  When I told him I wanted to screen him for syphilis as well as viral hepatitis panel he asked me to "screen him for everything.  In terms of risk for HIV does not sound to be at high risk.  He does not have frequent sexual partners and when he has they have been in typically monogamous relationships.  No hx of sex with CSW or MSM.  I have personally spent 62 minutes involved in face-to-face and non-face-to-face activities for this patient on the day of the visit. Professional time spent includes the following activities: Preparing to see the patient (review of tests), Obtaining and/or reviewing separately obtained history (admission/discharge record), Performing a medically appropriate examination and/or evaluation , Ordering medications/tests/procedures, referring and communicating with other health care professionals, Documenting clinical information in the EMR, Independently interpreting results (not separately reported), Communicating results to the patient/family/caregiver, Counseling and educating the patient/family/caregiver and Care coordination (not separately reported).

## 2023-01-08 LAB — COMPREHENSIVE METABOLIC PANEL
ALT: 22 IU/L (ref 0–44)
AST: 19 IU/L (ref 0–40)
Albumin: 4.4 g/dL (ref 3.9–4.9)
Alkaline Phosphatase: 114 IU/L (ref 44–121)
BUN/Creatinine Ratio: 22 (ref 10–24)
BUN: 20 mg/dL (ref 8–27)
Bilirubin Total: 0.4 mg/dL (ref 0.0–1.2)
CO2: 22 mmol/L (ref 20–29)
Calcium: 9.7 mg/dL (ref 8.6–10.2)
Chloride: 108 mmol/L — ABNORMAL HIGH (ref 96–106)
Creatinine, Ser: 0.9 mg/dL (ref 0.76–1.27)
Globulin, Total: 2.6 g/dL (ref 1.5–4.5)
Glucose: 135 mg/dL — ABNORMAL HIGH (ref 70–99)
Potassium: 4.9 mmol/L (ref 3.5–5.2)
Sodium: 146 mmol/L — ABNORMAL HIGH (ref 134–144)
Total Protein: 7 g/dL (ref 6.0–8.5)
eGFR: 95 mL/min/{1.73_m2} (ref >59)

## 2023-01-08 LAB — CBC WITH DIFF/PLATELET
Basophils Absolute: 0 10*3/uL (ref 0.0–0.2)
Basos: 1 %
EOS (ABSOLUTE): 0.1 10*3/uL (ref 0.0–0.4)
Eos: 2 %
Hematocrit: 48.4 % (ref 37.5–51.0)
Hemoglobin: 15.9 g/dL (ref 13.0–17.7)
Immature Grans (Abs): 0 10*3/uL (ref 0.0–0.1)
Immature Granulocytes: 0 %
Lymphocytes Absolute: 1.6 10*3/uL (ref 0.7–3.1)
Lymphs: 26 %
MCH: 30.6 pg (ref 26.6–33.0)
MCHC: 32.9 g/dL (ref 31.5–35.7)
MCV: 93 fL (ref 79–97)
Monocytes Absolute: 0.5 10*3/uL (ref 0.1–0.9)
Monocytes: 8 %
Neutrophils Absolute: 4 10*3/uL (ref 1.4–7.0)
Neutrophils: 63 %
Platelets: 153 10*3/uL (ref 150–450)
RBC: 5.19 x10E6/uL (ref 4.14–5.80)
RDW: 13.4 % (ref 11.6–15.4)
WBC: 6.2 10*3/uL (ref 3.4–10.8)

## 2023-01-08 LAB — URINE CYTOLOGY ANCILLARY ONLY
Chlamydia: NEGATIVE
Comment: NEGATIVE
Comment: NEGATIVE
Comment: NORMAL
Neisseria Gonorrhea: NEGATIVE
Trichomonas: NEGATIVE

## 2023-01-08 LAB — SJOGRENS SYNDROME-B EXTRACTABLE NUCLEAR ANTIBODY: SSB (La) (ENA) Antibody, IgG: <1 AI

## 2023-01-08 LAB — HEPATITIS B SURFACE ANTIGEN: Hepatitis B Surface Ag: NONREACTIVE

## 2023-01-08 LAB — HEPATITIS C AB W/RFL RNA, PCR + GENO: Hepatitis C Ab: NONREACTIVE

## 2023-01-08 LAB — HEPATITIS B SURFACE ANTIBODY, QUANTITATIVE: Hep B S AB Quant (Post): <5 m[IU]/mL — ABNORMAL LOW (ref >=10)

## 2023-01-08 LAB — HEPATITIS A ANTIBODY, TOTAL: Hepatitis A AB,Total: NONREACTIVE

## 2023-01-08 LAB — RPR: RPR Ser Ql: NONREACTIVE

## 2023-01-08 LAB — SEDIMENTATION RATE: Sed Rate: 2 mm/hr (ref 0–30)

## 2023-01-08 LAB — CRYOGLOBULIN

## 2023-01-08 LAB — C-REACTIVE PROTEIN: CRP: <1 mg/L (ref 0–10)

## 2023-01-08 LAB — SJOGRENS SYNDROME-A EXTRACTABLE NUCLEAR ANTIBODY: SSA (Ro) (ENA) Antibody, IgG: <1 AI

## 2023-01-08 LAB — HIV ANTIBODY (ROUTINE TESTING W REFLEX): HIV Screen 4th Generation wRfx: NONREACTIVE

## 2023-01-13 LAB — PAN-ANCA
Myeloperoxidase Abs: NEGATIVE AI
Serine Protease 3: <1 AI (ref <1.0)
Serine Protease 3: <1 AI (ref <1.0)

## 2023-01-13 LAB — ANA SCREEN,IFA,REFLEX TITER/PATTERN,REFLEX MPLX 11 AB CASCADE
Anti Nuclear Antibody (ANA): NEGATIVE
Cyclic Citrullin Peptide Ab: <16 U
MUTATED CITRULLINATED VIMENTIN (MCV) AB: <20 U/mL (ref <20)
Rheumatoid fact SerPl-aCnc: <10 [IU]/mL (ref <14)

## 2023-01-14 ENCOUNTER — Telehealth: Payer: Self-pay | Admitting: Internal Medicine

## 2023-01-14 NOTE — Telephone Encounter (Signed)
-----   Message from Sealy Blas sent at 01/13/2023  4:14 PM EDT ----- Regarding: RE: Anselmo Pickler Afternoon  Referral was  sent  to [facility]   I contact  them   Pt has been called x1 and will receive another call and 2 texts. ----- Message ----- From: Marcine Matar, MD Sent: 01/11/2023   9:05 AM EDT To: Dionne Bucy Subject: Derm                                           Were you able to get this pt in with dermatology?  Needs to be seen ASAP.

## 2023-01-30 ENCOUNTER — Telehealth: Payer: Self-pay | Admitting: Cardiovascular Disease

## 2023-01-30 MED ORDER — AMOXICILLIN 500 MG PO TABS: 2000.0000 mg | ORAL_TABLET | Freq: Once | ORAL | 3 refills | Status: DC | PRN

## 2023-01-30 NOTE — Telephone Encounter (Signed)
*  STAT* If patient is at the pharmacy, call can be transferred to refill team.   1. Which medications need to be refilled? (please list name of each medication and dose if known) amoxicillin (AMOXIL) 500 MG tablet   2. Which pharmacy/location (including street and city if local pharmacy) is medication to be sent to? [id number] - [place], [place] - [address]   3. Do they need a 30 day or 90 day supply? 4

## 2023-01-30 NOTE — Telephone Encounter (Signed)
Pt's medication was sent to pt's pharmacy as requested. Confirmation received.  °

## 2023-02-09 ENCOUNTER — Ambulatory Visit (HOSPITAL_COMMUNITY): Payer: Medicaid Other | Attending: Cardiology

## 2023-02-09 DIAGNOSIS — I350 Nonrheumatic aortic (valve) stenosis: Secondary | ICD-10-CM | POA: Insufficient documentation

## 2023-02-09 LAB — ECHOCARDIOGRAM COMPLETE
AR max vel: 0.74 cm2
AV Area VTI: 0.75 cm2
AV Area mean vel: 0.66 cm2
AV Mean grad: 53 mm[Hg]
AV Peak grad: 65.5 mm[Hg]
Ao pk vel: 4.05 m/s
Area-P 1/2: 3.5 cm2
Est EF: 55
P 1/2 time: 235 ms
S' Lateral: 4.3 cm

## 2023-02-10 ENCOUNTER — Ambulatory Visit: Payer: Medicaid Other | Admitting: Internal Medicine

## 2023-02-18 ENCOUNTER — Encounter: Payer: Self-pay | Admitting: Cardiovascular Disease

## 2023-02-18 ENCOUNTER — Ambulatory Visit: Payer: Medicaid Other | Attending: Cardiovascular Disease | Admitting: Cardiovascular Disease

## 2023-02-18 VITALS — BP 118/80 | HR 76 | Ht 73.0 in | Wt 233.2 lb

## 2023-02-18 DIAGNOSIS — I350 Nonrheumatic aortic (valve) stenosis: Secondary | ICD-10-CM

## 2023-02-18 NOTE — Progress Notes (Signed)
Structural Heart Clinic Note  Chief Complaint  Patient presents with   Follow-up    Severe aortic stenosis   History of Present Illness: 62 yo male with history of CAD, chronic systolic CHF, hyperlipidemia, HTN, obesity and aortic stenosis who is here today for follow up. I saw him as a new consult in May 2024 for further discussion regarding his aortic stenosis. He presented with CHF in 2018 and was found to have severe LV systolic dysfunction with LVEF=25-30%. Cardiac cath with severe two vessel CAD. Drug eluting stents were placed in the LAD and RCA. LV function returned to normal following his revascularization. He has been followed for bicuspid aortic stenosis in our office by Dr. Royann [name]. Echo 07/16/22 with LVEF=55-60%. Severe aortic stenosis with mean gradient 42 mmHg, peak gradient 64 mmHg, AVA 0.6 cm2, DI 0.18, SVI 23. The valve appears to be bicuspid. When I met him in May 2024 he had no complaints so we elected to follow his aortic stenosis. Echo 02/09/23 with LVEF=55%, mild LVH. Bicuspid aortic valve with severe stenosis with mean gradient of 53 mmHg, AVA 0.75 cm2, SVI 28, DI 0.17.   He is here today for follow up. The patient denies any chest pain, dyspnea, palpitations, lower extremity edema, orthopnea, PND, dizziness, near syncope or syncope. He is very active in his yard. He is now deer hunting.    He lives in [place], [place] by himself. 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 He has not seen a dentist lately. He has a broken tooth.   Primary Care Physician: Joshua Matar, MD Primary Cardiologist: Joshua [name] Referring Cardiologist: Joshua [name]  Past Medical History:  Diagnosis Date   Aortic stenosis    CAD (coronary artery disease), native coronary artery 12/26/2016   DES LAD & RCA, EF 25-30%   CHF (congestive heart failure) (HCC)    Dermatitis 01/07/2023   Dyslipidemia    Hypertension    Obesity    Osteoarthritis    Pre-diabetes    a. prior A1C 5.6, states he was on meds for  this at one point.   Skin lesions 01/06/2023    Past Surgical History:  Procedure Laterality Date   CORONARY STENT INTERVENTION N/A 12/29/2016   Procedure: CORONARY STENT INTERVENTION;  Surgeon: Joshua Crafts, MD;  Location: [facility] INVASIVE CV LAB;  Service: Cardiovascular;  Laterality: N/A;   RIGHT/LEFT HEART CATH AND CORONARY ANGIOGRAPHY N/A 12/29/2016   Procedure: RIGHT/LEFT HEART CATH AND CORONARY ANGIOGRAPHY;  Surgeon: Joshua Crafts, MD;  Location: [facility] INVASIVE CV LAB;  Service: Cardiovascular;  Laterality: N/A;   SHOULDER ARTHROSCOPY WITH ROTATOR CUFF REPAIR AND OPEN BICEPS TENODESIS Right 11/28/2021   Procedure: RIGHT SHOULDER ARTHROSCOPY WITH ROTATOR CUFF REPAIR AND OPEN BICEPS TENODESIS;  Surgeon: Joshua Cote, MD;  Location: [facility] OR;  Service: Orthopedics;  Laterality: Right;   SHOULDER ARTHROSCOPY WITH ROTATOR CUFF REPAIR AND OPEN BICEPS TENODESIS Left 07/21/2022   Procedure: LEFT SHOULDER ARTHROSCOPY WITH ROTATOR CUFF REPAIR AND BICEPS TENODESIS;  Surgeon: Joshua Cote, MD;  Location: [facility] OR;  Service: Orthopedics;  Laterality: Left;   TONSILLECTOMY     TOTAL HIP ARTHROPLASTY Right 04/26/2018   Procedure: RIGHT TOTAL HIP ARTHROPLASTY ANTERIOR APPROACH;  Surgeon: Joshua Kos, MD;  Location: [facility] OR;  Service: Orthopedics;  Laterality: Right;   TOTAL HIP ARTHROPLASTY Left 11/15/2018   TOTAL HIP ARTHROPLASTY Left 11/15/2018   Procedure: LEFT TOTAL HIP ARTHROPLASTY ANTERIOR APPROACH;  Surgeon: Joshua Kos, MD;  Location: [facility] OR;  Service: Orthopedics;  Laterality: Left;  Current Outpatient Medications  Medication Sig Dispense Refill   amLODipine (NORVASC) 5 MG tablet Take 1 tablet (5 mg total) by mouth daily. Please make a PCP appointment for more refills. 90 tablet 1   amoxicillin (AMOXIL) 500 MG tablet Take 4 tablets (2,000 mg total) by mouth once as needed for up to 1 dose (Take one hour prior to dental procedure). 4 tablet 3   ASPIRIN 81 PO Take 1 tablet by mouth daily.      carvedilol (COREG) 3.125 MG tablet TAKE 1 TABLET BY MOUTH 2 TIMES DAILY. 180 tablet 0   Evolocumab (REPATHA SURECLICK) 140 MG/ML SOAJ INJECT 1 ML INTO THE SKIN EVERY 14 (FOURTEEN) DAYS. 6 mL 3   fluticasone (FLONASE) 50 MCG/ACT nasal spray PLACE 1 SPRAY INTO BOTH NOSTRILS DAILY AS NEEDED FOR ALLERGIES OR RHINITIS. 48 mL 0   lisinopril (ZESTRIL) 10 MG tablet Take 1 tablet (10 mg total) by mouth 2 (two) times daily. 180 tablet 1   loratadine (CLARITIN) 10 MG tablet Take 10 mg by mouth daily as needed for allergies.     methocarbamol (ROBAXIN) 500 MG tablet Take 1 tablet (500 mg total) by mouth 2 (two) times daily as needed for muscle spasms. 20 tablet 2   potassium chloride (KLOR-CON) 10 MEQ tablet Take 1 tablet (10 mEq total) by mouth daily. Please make PCP appointment. 90 tablet 1   No current facility-administered medications for this visit.    Allergies  Allergen Reactions   Statins Other (See Comments)    Elevated LFTs    Social History   Socioeconomic History   Marital status: Single    Spouse name: Not on file   Number of children: 0   Years of education: Not on file   Highest education level: Not on file  Occupational History   Occupation: Retired-Upholstery  Tobacco Use   Smoking status: Former    Current packs/day: 0.00    Types: Cigarettes    Quit date: 08/18/2000    Years since quitting: 22.5    Passive exposure: Past   Smokeless tobacco: Never   Tobacco comments:    Smoked lightly for approx 20 years, quit 1990s  Vaping Use   Vaping status: Never Used  Substance and Sexual Activity   Alcohol use: Yes    Comment: occasionally   Drug use: Yes    Types: Marijuana    Comment: every night (to sleep)   Sexual activity: Not on file  Other Topics Concern   Not on file  Social History Narrative   Lives Home alone. Sister makes all health related decisions. Declined Advanced directive at this time   Social Determinants of Health   Financial Resource Strain: Not  on file  Food Insecurity: Not on file  Transportation Needs: Not on file  Physical Activity: Not on file  Stress: Not on file  Social Connections: Not on file  Intimate Partner Violence: Not on file    Family History  Problem Relation Age of Onset   CAD Father        MI/CABG age 76    Review of Systems:  As stated in the HPI and otherwise negative.   BP 118/80   Pulse 76   Ht 6\' 1"  (1.854 m)   Wt 105.8 kg   SpO2 93%   BMI 30.77 kg/m   Physical Examination: General: Well developed, well nourished, NAD  HEENT: OP clear, mucus membranes moist  SKIN: warm, dry. No rashes. Neuro: No focal deficits  Musculoskeletal: Muscle strength 5/5 all ext  Psychiatric: Mood and affect normal  Neck: No JVD, no carotid bruits, no thyromegaly, no lymphadenopathy.  Lungs:Clear bilaterally, no wheezes, rhonci, crackles Cardiovascular: Regular rate and rhythm. Harsh systolic murmur.  Abdomen:Soft. Bowel sounds present. Non-tender.  Extremities: No lower extremity edema. Pulses are 2 + in the bilateral DP/PT.  EKG:  EKG is not ordered today. The ekg ordered today demonstrates   Echo 02/09/23:  1. Left ventricular ejection fraction, by estimation, is 55%. The left  ventricle has normal function. The left ventricle has no regional wall  motion abnormalities. There is mild concentric left ventricular  hypertrophy. Left ventricular diastolic  parameters are consistent with Grade II diastolic dysfunction  (pseudonormalization).   2. Right ventricular systolic function is normal. The right ventricular  size is mildly enlarged. Tricuspid regurgitation signal is inadequate for  assessing PA pressure.   3. Left atrial size was mildly dilated.   4. The mitral valve is normal in structure. Trivial mitral valve  regurgitation. No evidence of mitral stenosis.   5. The aortic valve is bicuspid. There is moderate calcification of the  aortic valve. Aortic valve regurgitation is mild. Severe aortic  valve  stenosis. Aortic valve area, by VTI measures 0.75 cm. Aortic valve mean  gradient measures 53.0 mmHg.   6. Aortic dilatation noted. There is mild dilatation of the ascending  aorta, measuring 38 mm.   7. The inferior vena cava is normal in size with greater than 50%  respiratory variability, suggesting right atrial pressure of 3 mmHg.   FINDINGS   Left Ventricle: Left ventricular ejection fraction, by estimation, is  55%. The left ventricle has normal function. The left ventricle has no  regional wall motion abnormalities. The left ventricular internal cavity  size was normal in size. There is mild  concentric left ventricular hypertrophy. Left ventricular diastolic  parameters are consistent with Grade II diastolic dysfunction  (pseudonormalization).   Right Ventricle: The right ventricular size is mildly enlarged. No  increase in right ventricular wall thickness. Right ventricular systolic  function is normal. Tricuspid regurgitation signal is inadequate for  assessing PA pressure.   Left Atrium: Left atrial size was mildly dilated.   Right Atrium: Right atrial size was normal in size.   Pericardium: There is no evidence of pericardial effusion.   Mitral Valve: The mitral valve is normal in structure. There is mild  calcification of the mitral valve leaflet(s). Mild mitral annular  calcification. Trivial mitral valve regurgitation. No evidence of mitral  valve stenosis.   Tricuspid Valve: The tricuspid valve is normal in structure. Tricuspid  valve regurgitation is trivial.   Aortic Valve: The aortic valve is bicuspid. There is moderate  calcification of the aortic valve. Aortic valve regurgitation is mild.  Aortic regurgitation PHT measures 235 msec. Severe aortic stenosis is  present. Aortic valve mean gradient measures 53.0  mmHg. Aortic valve peak gradient measures 65.5 mmHg. Aortic valve area, by  VTI measures 0.75 cm.   Pulmonic Valve: The pulmonic valve  was normal in structure. Pulmonic valve  regurgitation is not visualized.   Aorta: The aortic root is normal in size and structure and aortic  dilatation noted. There is mild dilatation of the ascending aorta,  measuring 38 mm.   Venous: The inferior vena cava is normal in size with greater than 50%  respiratory variability, suggesting right atrial pressure of 3 mmHg.   IAS/Shunts: No atrial level shunt detected by color flow Doppler.  LEFT VENTRICLE  PLAX 2D  LVIDd:         5.60 cm   Diastology  LVIDs:         4.30 cm   LV e' medial:    7.07 cm/s  LV PW:         1.00 cm   LV E/e' medial:  16.7  LV IVS:        1.00 cm   LV e' lateral:   9.80 cm/s  LVOT diam:     2.40 cm   LV E/e' lateral: 12.0  LV SV:         64  LV SV Index:   28  LVOT Area:     4.52 cm     RIGHT VENTRICLE             IVC  RV Basal diam:  4.80 cm     IVC diam: 1.60 cm  RV S prime:     15.33 cm/s  TAPSE (M-mode): 2.1 cm   LEFT ATRIUM            Index        RIGHT ATRIUM           Index  LA diam:      5.40 cm  2.41 cm/m   RA Area:     13.10 cm  LA Vol (A2C): 57.6 ml  25.66 ml/m  RA Volume:   31.90 ml  14.21 ml/m  LA Vol (A4C): 102.0 ml 45.44 ml/m   AORTIC VALVE  AV Area (Vmax):    0.74 cm  AV Area (Vmean):   0.66 cm  AV Area (VTI):     0.75 cm  AV Vmax:           404.60 cm/s  AV Vmean:          304.200 cm/s  AV VTI:            0.852 m  AV Peak Grad:      65.5 mmHg  AV Mean Grad:      53.0 mmHg  LVOT Vmax:         66.40 cm/s  LVOT Vmean:        44.200 cm/s  LVOT VTI:          0.141 m  LVOT/AV VTI ratio: 0.17  AI PHT:            235 msec    AORTA  Ao Root diam: 3.60 cm  Ao Asc diam:  3.80 cm   MITRAL VALVE  MV Area (PHT): 3.50 cm     SHUNTS  MV Decel Time: 217 msec     Systemic VTI:  0.14 m  MV E velocity: 118.00 cm/s  Systemic Diam: 2.40 cm  MV A velocity: 90.15 cm/s  MV E/A ratio:  1.31    Recent Labs: 01/01/2023: ALT 22; BUN 20; Creatinine, Ser 0.90; Hemoglobin 15.9; Platelets  153; Potassium 4.9; Sodium 146   Lipid Panel    Component Value Date/Time   CHOL 103 01/02/2023 0915   TRIG 69 01/02/2023 0915   HDL 62 01/02/2023 0915   CHOLHDL 1.7 01/02/2023 0915   CHOLHDL 4.6 12/27/2016 0238   VLDL 18 12/27/2016 0238   LDLCALC 26 01/02/2023 0915     Wt Readings from Last 3 Encounters:  02/18/23 105.8 kg  01/07/23 100.2 kg  01/02/23 102.5 kg     Assessment and Plan:   1. Severe Aortic Valve Stenosis: He  has severe stage C1 aortic valve stenosis with a bicuspid valve. NYHA class 1 symptoms. I have personally reviewed the echo images. The aortic valve leaflets are thickened with limited leaflet mobility. I think he would benefit from AVR when he becomes symptomatic. He would be a candidate for surgical AVR or TAVR.    I have reviewed the natural history of aortic stenosis with the patient and their family members  who are present today. We have discussed the limitations of medical therapy and the poor prognosis associated with symptomatic aortic stenosis. We have reviewed potential treatment options, including palliative medical therapy, conventional surgical aortic valve replacement, and transcatheter aortic valve replacement. We discussed treatment options in the context of the patient's specific comorbid medical conditions.   I suspect that he will become symptomatic soon. Right now he feels great. I will repeat his echo in 6 months. He will schedule his dental visit as advised at the last visit.  He will call back with any symptoms.     Labs/ tests ordered today include:   Orders Placed This Encounter  Procedures   ECHOCARDIOGRAM COMPLETE   Disposition:   F/U with me in 6 months post echo  Signed, Verne Carrow, MD, [facility] 02/18/2023 10:09 AM    [facility] [address] [place], [place], [place]  [address] Phone: [phone]; Fax: [phone]

## 2023-02-18 NOTE — Patient Instructions (Signed)
Medication Instructions:  No changes *If you need a refill on your cardiac medications before your next appointment, please call your pharmacy*   Lab Work: None today   Testing/Procedures: ECHO DUE IN MAY 2025 Your physician has requested that you have an echocardiogram. Echocardiography is a painless test that uses sound waves to create images of your heart. It provides your doctor with information about the size and shape of your heart and how well your heart's chambers and valves are working. This procedure takes approximately one hour. There are no restrictions for this procedure. Please do NOT wear cologne, perfume, aftershave, or lotions (deodorant is allowed). Please arrive 15 minutes prior to your appointment time.  Please note: We ask at that you not bring children with you during ultrasound (echo/ vascular) testing. Due to room size and safety concerns, children are not allowed in the ultrasound rooms during exams. Our front office staff cannot provide observation of children in our lobby area while testing is being conducted. An adult accompanying a patient to their appointment will only be allowed in the ultrasound room at the discretion of the ultrasound technician under special circumstances. We apologize for any inconvenience.    Follow-Up: At [facility], you and your health needs are our priority.  As part of our continuing mission to provide you with exceptional heart care, we have created designated Provider Care Teams.  These Care Teams include your primary Cardiologist (physician) and Advanced Practice Providers (APPs -  Physician Assistants and Nurse Practitioners) who all work together to provide you with the care you need, when you need it.   Your next appointment:   6 month(s)  Provider:   Verne Carrow, MD

## 2023-02-27 ENCOUNTER — Other Ambulatory Visit: Payer: Self-pay | Admitting: Internal Medicine

## 2023-02-27 DIAGNOSIS — I10 Essential (primary) hypertension: Secondary | ICD-10-CM

## 2023-04-20 ENCOUNTER — Ambulatory Visit: Payer: Self-pay

## 2023-04-20 ENCOUNTER — Other Ambulatory Visit: Payer: Self-pay | Admitting: Internal Medicine

## 2023-04-20 DIAGNOSIS — I2510 Atherosclerotic heart disease of native coronary artery without angina pectoris: Secondary | ICD-10-CM

## 2023-04-20 DIAGNOSIS — I10 Essential (primary) hypertension: Secondary | ICD-10-CM

## 2023-04-20 NOTE — Telephone Encounter (Signed)
 Message being to referral coordinator to f/u on Dermatology referral.   Message per referral coordinator [place] Blas.:   I Talked to Tobi Bastos she said they call him lvm to call back to schedule . she told me that she was going to contact patient one more time .

## 2023-04-20 NOTE — Telephone Encounter (Signed)
 Summary: hip infection?   Pt states that he was diagnosed with scabies, and is still having issues with his hip. Pt states that he does not think he has scabies, he think it is from the cats. Pt states that his hip is infected and he took some antibiotics that was left over from his teeth. The antibiotics from his teeth helped his hip some but he is out of the antibiotics and his him is back infected. He states that it is from the mold in his house and cat litter that is causing the infection from his hip. Pt states that what his PCP gave him is not strong enough and the cream is not strong enough and is wanting antibiotics prescribed for him. Pt is wanting amoxicillin  (AMOXIL ) 500 MG tablet sent in for him. Pt states that he is having worms in his spit and bowl movements. Pt states that he has to have his teeth work done and they will not see him until the infection is cleared out of his body. Please advise.      Left message to call back.

## 2023-04-20 NOTE — Telephone Encounter (Signed)
    Chief Complaint: :I have infections and cat worms in my skin, I need the Amoxicillin  refilled. Declines appointment. Symptoms: About Frequency: Weeks Pertinent Negatives: Patient denies  Disposition: [] ED /[] Urgent Care (no appt availability in office) / [] Appointment(In office/virtual)/ []  [place] [facility]/ [] Home Care/ [x] Refused Recommended Disposition /[] [place] [facility]/ [x]  Follow-up with PCP Additional Notes: Please advise pt.  Reason for Disposition  Caller can't describe it clearly  Answer Assessment - Initial Assessment Questions 1. APPEARANCE of LESION: What does it look like?      Mole and worm 2. SIZE: How big is it? (e.g., inches, cm; or compare to size of pinhead, tip of pen, eraser, coin, pea, grape, ping pong ball)      Quarter size 3. COLOR: What color is it? Is there more than one color?       No 4. SHAPE: What shape is it? (e.g., round, irregular)     Irregular and the worm gets 2 inches long 5. RAISED: Does it stick up above the skin or is it flat? (e.g., raised or elevated)     raised 6. TENDER: Does it hurt when you touch it?  (Scale 1-10; or mild, moderate, severe)     Mild 7. LOCATION: Where is it located?      Right hip 8. ONSET: When did it first appear?      Weeks 9. NUMBER: Is there just one? or Are there others?     Many 10. CAUSE: What do you think it is?       Worms 11. OTHER SYMPTOMS: Do you have any other symptoms? (e.g., fever)       No 12. PREGNANCY: Is there any chance you are pregnant? When was your last menstrual period?       N/a  Protocols used: Skin Lesion - Moles or Growths-A-AH

## 2023-04-20 NOTE — Telephone Encounter (Signed)
 Medication Refill -  Most Recent Primary Care Visit:  Provider: [facility]  Department: [facility]  Visit Type: LAB  Date: 01/01/2023  Medication:  carvedilol  (COREG ) 3.125 MG tablet Requesting 90 tablets   Has the patient contacted their pharmacy? Yes Pharmacy advised pt that they have reached out to his PCP several times and no response.    Is this the correct pharmacy for this prescription? Yes  This is the patient's preferred pharmacy:  [id number] [place], [place] - [address] [place] [address] Phone: [phone] Fax: [phone]    Has the prescription been filled recently? No  Is the patient out of the medication? Yes Pt has been out of medication since 03/29/2023.   Has the patient been seen for an appointment in the last year OR does the patient have an upcoming appointment? Yes  Can we respond through MyChart? No  Agent: Please be advised that Rx refills may take up to 3 business days. We ask that you follow-up with your pharmacy.

## 2023-04-20 NOTE — Telephone Encounter (Signed)
 Medication Refill -  Most Recent Primary Care Visit:  Provider: [facility]  Department[facility]  Visit Type: LAB  Date: 01/01/2023  Medication: carvedilol  (COREG ) 3.125 MG tablet   Has the patient contacted their pharmacy? Yes   Is this the correct pharmacy for this prescription? Yes  This is the patient's preferred pharmacy:   [id number] [place], [place] - [address] [place] [address] Phone: [phone] Fax: [phone]  Has the prescription been filled recently? Yes  Is the patient out of the medication? Yes  Has the patient been seen for an appointment in the last year OR does the patient have an upcoming appointment? Yes  Can we respond through MyChart? No  Agent: Please be advised that Rx refills may take up to 3 business days. We ask that you follow-up with your pharmacy.

## 2023-04-20 NOTE — Telephone Encounter (Signed)
 Give in person appt with any available provider. Also let pt know that he needs to see the dermatologist as I have discussed with him several times in the past already.  According to the referral coordinator, dermatology has called and left LVM for him.  He needs to call them back.

## 2023-04-21 NOTE — Telephone Encounter (Signed)
 Left message on voicemail to return call.  Attempt to call and relay message per Dr. Vicci:    Give in person appt with any available provider. Also let pt know that he needs to see the dermatologist as I have discussed with him several times in the past already.  According to the referral coordinator, dermatology has called and left LVM for him.  He needs to call them back.

## 2023-04-22 ENCOUNTER — Ambulatory Visit: Payer: Self-pay

## 2023-04-22 MED ORDER — CARVEDILOL 3.125 MG PO TABS: 3.1250 mg | ORAL_TABLET | Freq: Two times a day (BID) | ORAL | 0 refills | Status: DC

## 2023-04-22 NOTE — Telephone Encounter (Signed)
 Requested Prescriptions  Pending Prescriptions Disp Refills   carvedilol  (COREG ) 3.125 MG tablet 180 tablet 0    Sig: Take 1 tablet (3.125 mg total) by mouth 2 (two) times daily.     Cardiovascular: Beta Blockers 3 Passed - 04/22/2023 12:30 PM      Passed - Cr in normal range and within 360 days    Creatinine, Ser  Date Value Ref Range Status  01/01/2023 0.90 0.76 - 1.27 mg/dL Final         Passed - AST in normal range and within 360 days    AST  Date Value Ref Range Status  01/01/2023 19 0 - 40 IU/L Final         Passed - ALT in normal range and within 360 days    ALT  Date Value Ref Range Status  01/01/2023 22 0 - 44 IU/L Final         Passed - Last BP in normal range    BP Readings from Last 1 Encounters:  02/18/23 118/80         Passed - Last Heart Rate in normal range    Pulse Readings from Last 1 Encounters:  02/18/23 76         Passed - Valid encounter within last 6 months    Recent Outpatient Visits           3 months ago Dermatitis of lower extremity   [facility] [place]. [facility] Vicci Barnie NOVAK, MD   4 months ago Dermatitis of lower extremity   [facility] [place]. [facility] Vicci Barnie B, MD   6 months ago Abscess around nail of left index finger   [facility] Dept Of [place]. [facility] Vicci Barnie NOVAK, MD   1 year ago Essential hypertension   [facility] [place] [facility] Of [place]. [facility] Vicci Barnie NOVAK, MD   1 year ago Essential hypertension   [facility] [place] - [facility] Dept Of [place]. [facility] Vicci Barnie NOVAK, MD

## 2023-04-22 NOTE — Telephone Encounter (Signed)
 Patient called, left VM to return the call to the office to speak to the NT.    Summary: tooth problem,requesting antibiotic   Pt called in about getting, antibiotic for his tooth he had amoxicillin  form a different. Pt told me that Dr said that Dr Vicci would have to prescribe it. Dr, I let him know he would have to have an appt to get antibiotic from Dr Vicci

## 2023-04-22 NOTE — Telephone Encounter (Signed)
 Message from Victoria B sent at 04/22/2023 11:36 AM EST  Summary: tooth problem,requesting antibiotic   Pt called in about getting, antibiotic for his tooth he had amoxicillin  form a different. Pt told me that Dr said that Dr Vicci would have to prescribe it. Dr, I let him know he would have to have an appt to get antibiotic from Dr Vicci           called pt a third attempt and LM on VM. ROuting to office. Reason for Disposition . Third attempt to contact caller AND no contact made. Phone number verified.  Protocols used: No Contact or Duplicate Contact Call-A-AH

## 2023-04-22 NOTE — Telephone Encounter (Signed)
 2nd attempt. Patient called, left VM to return the call to the office to speak to the NT.    Summary: tooth problem,requesting antibiotic   Pt called in about getting, antibiotic for his tooth he had amoxicillin  form a different. Pt told me that Dr said that Dr Vicci would have to prescribe it. Dr, I let him know he would have to have an appt to get antibiotic from Dr Vicci

## 2023-04-22 NOTE — Telephone Encounter (Signed)
 Duplicate request, Rx reordered 04/22/23 #180, 0RF Requested Prescriptions  Pending Prescriptions Disp Refills   carvedilol  (COREG ) 3.125 MG tablet 180 tablet 0    Sig: Take 1 tablet (3.125 mg total) by mouth 2 (two) times daily.     Cardiovascular: Beta Blockers 3 Passed - 04/22/2023  4:16 PM      Passed - Cr in normal range and within 360 days    Creatinine, Ser  Date Value Ref Range Status  01/01/2023 0.90 0.76 - 1.27 mg/dL Final         Passed - AST in normal range and within 360 days    AST  Date Value Ref Range Status  01/01/2023 19 0 - 40 IU/L Final         Passed - ALT in normal range and within 360 days    ALT  Date Value Ref Range Status  01/01/2023 22 0 - 44 IU/L Final         Passed - Last BP in normal range    BP Readings from Last 1 Encounters:  02/18/23 118/80         Passed - Last Heart Rate in normal range    Pulse Readings from Last 1 Encounters:  02/18/23 76         Passed - Valid encounter within last 6 months    Recent Outpatient Visits           3 months ago Dermatitis of lower extremity   [facility] [place] Of [place]. [facility] Vicci Barnie NOVAK, MD   4 months ago Dermatitis of lower extremity   [facility] Of [place]. [facility] Vicci Barnie B, MD   6 months ago Abscess around nail of left index finger   [facility] [place] [facility] Dept Of [place]. [facility] Vicci Barnie NOVAK, MD   1 year ago Essential hypertension   [facility] [place] [facility] Of [place]. [facility] Vicci Barnie NOVAK, MD   1 year ago Essential hypertension   [facility] [place] [facility] Of [place]. [facility] Vicci Barnie NOVAK, MD

## 2023-04-23 NOTE — Telephone Encounter (Signed)
 Needs appt if he calls back.

## 2023-04-24 NOTE — Telephone Encounter (Signed)
 Call placed to patient unable to reach message left on VM.    My chart message sentGood morning,   We received you Message. Please be advised that your PCP is requiring to that you be scheduled for  a visit. Please call to schedule an appointment.    Thank you,    Calton Daring, RN

## 2023-05-13 ENCOUNTER — Telehealth: Payer: Self-pay | Admitting: Cardiovascular Disease

## 2023-05-13 NOTE — Telephone Encounter (Signed)
Pt said that his sister is a Charity fundraiser and said that pt was starting to have a grayish color to him which has raised some concerns, pt said he feels ok for the most part but requesting cb

## 2023-05-13 NOTE — Telephone Encounter (Signed)
Returned Pts call. Pt states that his sister told him he looks gray in color. Asked pt if he was having any symptoms patient stated he was not but wanted to be seen. Asked if he had contacted his PCP office he said no and that he was unhappy with that office and did not want to call them. At some point within the conversion he said he felt like his O2 level may be was low. I asked him to check it he refused but then later checked it and said it was 71%. I asked if that was possibly his HR and he said no he was looking at it. I told him he needed to call 911 and Pt started yelling that he was not going to the ED. Asked patient if he would check again and just insure it was o2 or Spo2. He did and stated it was 92%. Asked pt if he felt he was swollen or had recently gained weight he said he had possibly gained weight but no swelling in feet legs or ankles. Pt could not give me a BP. Pt stated he had been feeling weaker in the past few weeks at some point during our conversion.  Pt has appointment on 5/15. No appointment with Dr Clifton James available currently before that time. Told him I would forward this to Dr Clifton James and his nurse for review and instruction.

## 2023-05-13 NOTE — Telephone Encounter (Signed)
Left message to call back

## 2023-05-14 ENCOUNTER — Telehealth: Payer: Self-pay | Admitting: Cardiovascular Disease

## 2023-05-14 NOTE — Telephone Encounter (Signed)
Marland Kitchen  patient is returning phone call

## 2023-05-15 ENCOUNTER — Encounter (HOSPITAL_COMMUNITY): Payer: Self-pay | Admitting: Pharmacy Technician

## 2023-05-15 ENCOUNTER — Other Ambulatory Visit: Payer: Self-pay

## 2023-05-15 ENCOUNTER — Inpatient Hospital Stay (HOSPITAL_COMMUNITY)
Admission: EM | Admit: 2023-05-15 | Discharge: 2023-05-29 | DRG: 216 | Disposition: A | Discharge status: Home / Self Care | Payer: Medicaid Other | Attending: Surgery | Admitting: Surgery

## 2023-05-15 ENCOUNTER — Emergency Department (HOSPITAL_COMMUNITY): Payer: Medicaid Other

## 2023-05-15 DIAGNOSIS — E669 Obesity, unspecified: Secondary | ICD-10-CM | POA: Diagnosis not present

## 2023-05-15 DIAGNOSIS — Z79899 Other long term (current) drug therapy: Secondary | ICD-10-CM

## 2023-05-15 DIAGNOSIS — I442 Atrioventricular block, complete: Secondary | ICD-10-CM | POA: Diagnosis present

## 2023-05-15 DIAGNOSIS — I350 Nonrheumatic aortic (valve) stenosis: Secondary | ICD-10-CM | POA: Diagnosis not present

## 2023-05-15 DIAGNOSIS — I493 Ventricular premature depolarization: Secondary | ICD-10-CM | POA: Diagnosis not present

## 2023-05-15 DIAGNOSIS — I5033 Acute on chronic diastolic (congestive) heart failure: Secondary | ICD-10-CM | POA: Diagnosis not present

## 2023-05-15 DIAGNOSIS — Z96643 Presence of artificial hip joint, bilateral: Secondary | ICD-10-CM | POA: Diagnosis not present

## 2023-05-15 DIAGNOSIS — Z8249 Family history of ischemic heart disease and other diseases of the circulatory system: Secondary | ICD-10-CM

## 2023-05-15 DIAGNOSIS — Z888 Allergy status to other drugs, medicaments and biological substances status: Secondary | ICD-10-CM | POA: Diagnosis not present

## 2023-05-15 DIAGNOSIS — I5023 Acute on chronic systolic (congestive) heart failure: Secondary | ICD-10-CM | POA: Diagnosis present

## 2023-05-15 DIAGNOSIS — I472 Ventricular tachycardia, unspecified: Secondary | ICD-10-CM | POA: Diagnosis not present

## 2023-05-15 DIAGNOSIS — I340 Nonrheumatic mitral (valve) insufficiency: Secondary | ICD-10-CM | POA: Diagnosis not present

## 2023-05-15 DIAGNOSIS — I351 Nonrheumatic aortic (valve) insufficiency: Secondary | ICD-10-CM | POA: Diagnosis not present

## 2023-05-15 DIAGNOSIS — Y831 Surgical operation with implant of artificial internal device as the cause of abnormal reaction of the patient, or of later complication, without mention of misadventure at the time of the procedure: Secondary | ICD-10-CM | POA: Diagnosis present

## 2023-05-15 DIAGNOSIS — Z7982 Long term (current) use of aspirin: Secondary | ICD-10-CM

## 2023-05-15 DIAGNOSIS — I4891 Unspecified atrial fibrillation: Secondary | ICD-10-CM | POA: Diagnosis not present

## 2023-05-15 DIAGNOSIS — R0902 Hypoxemia: Secondary | ICD-10-CM | POA: Diagnosis not present

## 2023-05-15 DIAGNOSIS — I10 Essential (primary) hypertension: Secondary | ICD-10-CM | POA: Diagnosis present

## 2023-05-15 DIAGNOSIS — I110 Hypertensive heart disease with heart failure: Secondary | ICD-10-CM | POA: Diagnosis present

## 2023-05-15 DIAGNOSIS — I2510 Atherosclerotic heart disease of native coronary artery without angina pectoris: Secondary | ICD-10-CM | POA: Diagnosis not present

## 2023-05-15 DIAGNOSIS — J90 Pleural effusion, not elsewhere classified: Secondary | ICD-10-CM | POA: Diagnosis not present

## 2023-05-15 DIAGNOSIS — I5043 Acute on chronic combined systolic (congestive) and diastolic (congestive) heart failure: Secondary | ICD-10-CM | POA: Diagnosis present

## 2023-05-15 DIAGNOSIS — Z87891 Personal history of nicotine dependence: Secondary | ICD-10-CM | POA: Diagnosis not present

## 2023-05-15 DIAGNOSIS — R7303 Prediabetes: Secondary | ICD-10-CM

## 2023-05-15 DIAGNOSIS — Q2381 Bicuspid aortic valve: Secondary | ICD-10-CM | POA: Diagnosis not present

## 2023-05-15 DIAGNOSIS — T82855A Stenosis of coronary artery stent, initial encounter: Secondary | ICD-10-CM | POA: Diagnosis present

## 2023-05-15 DIAGNOSIS — I5082 Biventricular heart failure: Secondary | ICD-10-CM | POA: Diagnosis not present

## 2023-05-15 DIAGNOSIS — Z6831 Body mass index (BMI) 31.0-31.9, adult: Secondary | ICD-10-CM | POA: Diagnosis not present

## 2023-05-15 DIAGNOSIS — Z953 Presence of xenogenic heart valve: Secondary | ICD-10-CM

## 2023-05-15 DIAGNOSIS — Z1152 Encounter for screening for COVID-19: Secondary | ICD-10-CM

## 2023-05-15 DIAGNOSIS — Z955 Presence of coronary angioplasty implant and graft: Secondary | ICD-10-CM | POA: Diagnosis not present

## 2023-05-15 DIAGNOSIS — E785 Hyperlipidemia, unspecified: Secondary | ICD-10-CM | POA: Diagnosis present

## 2023-05-15 DIAGNOSIS — I509 Heart failure, unspecified: Secondary | ICD-10-CM | POA: Diagnosis not present

## 2023-05-15 DIAGNOSIS — Z01810 Encounter for preprocedural cardiovascular examination: Secondary | ICD-10-CM | POA: Diagnosis not present

## 2023-05-15 DIAGNOSIS — I5032 Chronic diastolic (congestive) heart failure: Secondary | ICD-10-CM | POA: Diagnosis not present

## 2023-05-15 LAB — CBC WITH DIFFERENTIAL/PLATELET
Abs Immature Granulocytes: 0.02 10*3/uL (ref 0.00–0.07)
Basophils Absolute: 0 10*3/uL (ref 0.0–0.1)
Basophils Relative: 0 %
Eosinophils Absolute: 0 10*3/uL (ref 0.0–0.5)
Eosinophils Relative: 0 %
HCT: 51.3 % (ref 39.0–52.0)
Hemoglobin: 16.7 g/dL (ref 13.0–17.0)
Immature Granulocytes: 0 %
Lymphocytes Relative: 9 %
Lymphs Abs: 0.8 10*3/uL (ref 0.7–4.0)
MCH: 30.1 pg (ref 26.0–34.0)
MCHC: 32.6 g/dL (ref 30.0–36.0)
MCV: 92.6 fL (ref 80.0–100.0)
Monocytes Absolute: 0.5 10*3/uL (ref 0.1–1.0)
Monocytes Relative: 6 %
Neutro Abs: 7.4 10*3/uL (ref 1.7–7.7)
Neutrophils Relative %: 85 %
Platelets: 169 10*3/uL (ref 150–400)
RBC: 5.54 MIL/uL (ref 4.22–5.81)
RDW: 13.9 % (ref 11.5–15.5)
WBC: 8.7 10*3/uL (ref 4.0–10.5)
nRBC: 0 % (ref 0.0–0.2)

## 2023-05-15 LAB — BASIC METABOLIC PANEL
Anion gap: 10 (ref 5–15)
BUN: 12 mg/dL (ref 8–23)
CO2: 26 mmol/L (ref 22–32)
Calcium: 8.8 mg/dL — ABNORMAL LOW (ref 8.9–10.3)
Chloride: 101 mmol/L (ref 98–111)
Creatinine, Ser: 0.78 mg/dL (ref 0.61–1.24)
GFR, Estimated: >60 mL/min (ref >60)
Glucose, Bld: 165 mg/dL — ABNORMAL HIGH (ref 70–99)
Potassium: 3.9 mmol/L (ref 3.5–5.1)
Sodium: 137 mmol/L (ref 135–145)

## 2023-05-15 LAB — RESP PANEL BY RT-PCR (RSV, FLU A&B, COVID)  RVPGX2
Influenza A by PCR: NEGATIVE
Influenza B by PCR: NEGATIVE
Resp Syncytial Virus by PCR: NEGATIVE
SARS Coronavirus 2 by RT PCR: NEGATIVE

## 2023-05-15 LAB — TROPONIN I (HIGH SENSITIVITY)
Troponin I (High Sensitivity): 34 ng/L — ABNORMAL HIGH (ref <18)
Troponin I (High Sensitivity): 32 ng/L — ABNORMAL HIGH (ref <18)

## 2023-05-15 LAB — MAGNESIUM: Magnesium: 2.2 mg/dL (ref 1.7–2.4)

## 2023-05-15 LAB — BRAIN NATRIURETIC PEPTIDE: B Natriuretic Peptide: 2270.9 pg/mL — ABNORMAL HIGH (ref 0.0–100.0)

## 2023-05-15 MED ORDER — CARVEDILOL 3.125 MG PO TABS
3.1250 mg | ORAL_TABLET | Freq: Two times a day (BID) | ORAL | Status: DC
  Administered 2023-05-16 – 2023-05-18 (×5): 3.125 mg via ORAL
  Filled 2023-05-15 (×5): qty 1

## 2023-05-15 MED ORDER — ACETAMINOPHEN 650 MG RE SUPP: 650.0000 mg | Freq: Four times a day (QID) | RECTAL | Status: DC | PRN

## 2023-05-15 MED ORDER — FUROSEMIDE 10 MG/ML IJ SOLN
60.0000 mg | Freq: Once | INTRAMUSCULAR | Status: AC
  Administered 2023-05-15: 60 mg via INTRAVENOUS
  Filled 2023-05-15: qty 6

## 2023-05-15 MED ORDER — LISINOPRIL 10 MG PO TABS
10.0000 mg | ORAL_TABLET | Freq: Two times a day (BID) | ORAL | Status: DC
  Administered 2023-05-16: 10 mg via ORAL
  Filled 2023-05-15: qty 1

## 2023-05-15 MED ORDER — POLYETHYLENE GLYCOL 3350 17 G PO PACK: 17.0000 g | PACK | Freq: Every day | ORAL | Status: DC | PRN

## 2023-05-15 MED ORDER — ASPIRIN 81 MG PO TBEC
81.0000 mg | DELAYED_RELEASE_TABLET | Freq: Every day | ORAL | Status: DC
  Administered 2023-05-16 – 2023-05-24 (×9): 81 mg via ORAL
  Filled 2023-05-15 (×9): qty 1

## 2023-05-15 MED ORDER — ACETAMINOPHEN 325 MG PO TABS
650.0000 mg | ORAL_TABLET | Freq: Four times a day (QID) | ORAL | Status: DC | PRN
  Administered 2023-05-17 – 2023-05-24 (×13): 650 mg via ORAL
  Filled 2023-05-15 (×13): qty 2

## 2023-05-15 MED ORDER — AMLODIPINE BESYLATE 5 MG PO TABS
5.0000 mg | ORAL_TABLET | Freq: Every day | ORAL | Status: DC
  Administered 2023-05-16: 5 mg via ORAL
  Filled 2023-05-15: qty 1

## 2023-05-15 MED ORDER — SODIUM CHLORIDE 0.9% FLUSH
3.0000 mL | Freq: Two times a day (BID) | INTRAVENOUS | Status: DC
  Administered 2023-05-16 – 2023-05-24 (×16): 3 mL via INTRAVENOUS

## 2023-05-15 NOTE — Telephone Encounter (Signed)
 See other telephone note.

## 2023-05-15 NOTE — ED Triage Notes (Signed)
Pt BIB [facility] from d/t resp distress over the last few days that became acutely worse the last hr before arrival. A/Ox4, does have Hx of CHF, initial RA sats were 70%, then 90% with NRB & then CPAP applied while en route to ED. EMS reports with CPAP he was 93% and labored breathing became better. EKG did show some elevation in lead 2 & 3. 22g Rt hand, 160/100, 80 bpm.

## 2023-05-15 NOTE — Telephone Encounter (Signed)
 Left message to call back

## 2023-05-15 NOTE — H&P (Signed)
History and Physical   Joshua [name] JYN:[id number] DOB: November 05, 1960 DOA: 05/15/2023  PCP: Marcine Matar, MD   Patient coming from: Home  Chief Complaint: Shortness of breath  HPI: Joshua [name] is a 63 y.o. male with medical history significant of hypertension, hyperlipidemia, CAD status post tenting, chronic diastolic CHF, aortic stenosis presenting with worsening shortness of breath.  Patient reports some worsening shortness of breath for the past 3 days with significant worsening over the last hour prior to presenting to the ED.  EMS was called and initially saturating around 70% which improved to 90% on nonrebreather, transition to CPAP by EMS due to increased work of breathing with good saturations.  Denies fevers, chills, chest pain, abdominal pain, constipation, diarrhea, nausea, vomiting.  ED Course: Vital signs in the ED notable blood pressure in the 110s to 140 systolic.  Placed on BiPAP to maintain saturations and for increased work of breathing.  Lab workup included BMP with glucose 165, calcium 8.8.  CBC within normal limits.  BNP elevated to 2270.  Troponin mildly elevated at 34, repeat pending.  Rester panel negative for flu COVID and RSV.  Chest x-ray showed low lung volumes with some changes suspicious for pulmonary vascular ingestion and small left pleural effusion.  Patient received 60 mg IV Lasix in the ED.  I have requested cardiology involvement given his severe aortic stenosis and concerned that that may be the driver of this exacerbation, has been considered for TAVR in the past.  Review of Systems: As per HPI otherwise all other systems reviewed and are negative.  Past Medical History:  Diagnosis Date   Aortic stenosis    CAD (coronary artery disease), native coronary artery 12/26/2016   DES LAD & RCA, EF 25-30%   CHF (congestive heart failure) (HCC)    COVID-19 vaccine regimen to maintain immunity completed 04/27/2020   Dermatitis 01/07/2023    Dyslipidemia    Hypertension    Obesity    Osteoarthritis    Pre-diabetes    a. prior A1C 5.6, states he was on meds for this at one point.   Skin lesions 01/06/2023    Past Surgical History:  Procedure Laterality Date   CORONARY STENT INTERVENTION N/A 12/29/2016   Procedure: CORONARY STENT INTERVENTION;  Surgeon: Corky Crafts, MD;  Location: [facility] INVASIVE CV LAB;  Service: Cardiovascular;  Laterality: N/A;   RIGHT/LEFT HEART CATH AND CORONARY ANGIOGRAPHY N/A 12/29/2016   Procedure: RIGHT/LEFT HEART CATH AND CORONARY ANGIOGRAPHY;  Surgeon: Corky Crafts, MD;  Location: [facility] INVASIVE CV LAB;  Service: Cardiovascular;  Laterality: N/A;   SHOULDER ARTHROSCOPY WITH ROTATOR CUFF REPAIR AND OPEN BICEPS TENODESIS Right 11/28/2021   Procedure: RIGHT SHOULDER ARTHROSCOPY WITH ROTATOR CUFF REPAIR AND OPEN BICEPS TENODESIS;  Surgeon: Huel Cote, MD;  Location: [facility] OR;  Service: Orthopedics;  Laterality: Right;   SHOULDER ARTHROSCOPY WITH ROTATOR CUFF REPAIR AND OPEN BICEPS TENODESIS Left 07/21/2022   Procedure: LEFT SHOULDER ARTHROSCOPY WITH ROTATOR CUFF REPAIR AND BICEPS TENODESIS;  Surgeon: Huel Cote, MD;  Location: [facility] OR;  Service: Orthopedics;  Laterality: Left;   TONSILLECTOMY     TOTAL HIP ARTHROPLASTY Right 04/26/2018   Procedure: RIGHT TOTAL HIP ARTHROPLASTY ANTERIOR APPROACH;  Surgeon: Tarry Kos, MD;  Location: [facility] OR;  Service: Orthopedics;  Laterality: Right;   TOTAL HIP ARTHROPLASTY Left 11/15/2018   TOTAL HIP ARTHROPLASTY Left 11/15/2018   Procedure: LEFT TOTAL HIP ARTHROPLASTY ANTERIOR APPROACH;  Surgeon: Tarry Kos, MD;  Location: [facility] OR;  Service: Orthopedics;  Laterality: Left;    Social History  reports that he quit smoking about 22 years ago. His smoking use included cigarettes. He has been exposed to tobacco smoke. He has never used smokeless tobacco. He reports current alcohol use. He reports current drug use. Drug: Marijuana.  Allergies  Allergen Reactions    Statins Other (See Comments)    Elevated LFTs    Family History  Problem Relation Age of Onset   CAD Father        MI/CABG age 30  Reviewed in remission  Prior to Admission medications   Medication Sig Start Date End Date Taking? Authorizing Provider  amLODipine (NORVASC) 5 MG tablet Take 1 tablet (5 mg total) by mouth daily. Please make a PCP appointment for more refills. 01/07/23   Marcine Matar, MD  amoxicillin (AMOXIL) 500 MG tablet Take 4 tablets (2,000 mg total) by mouth once as needed for up to 1 dose (Take one hour prior to dental procedure). 01/30/23   Croitoru, Mihai, MD  ASPIRIN 81 PO Take 1 tablet by mouth daily.    [provider]  carvedilol (COREG) 3.125 MG tablet Take 1 tablet (3.125 mg total) by mouth 2 (two) times daily. 04/22/23   Marcine Matar, MD  Evolocumab (REPATHA SURECLICK) 140 MG/ML SOAJ INJECT 1 ML INTO THE SKIN EVERY 14 (FOURTEEN) DAYS. 11/10/22   Croitoru, Mihai, MD  fluticasone (FLONASE) 50 MCG/ACT nasal spray PLACE 1 SPRAY INTO BOTH NOSTRILS DAILY AS NEEDED FOR ALLERGIES OR RHINITIS. 09/01/22   Marcine Matar, MD  lisinopril (ZESTRIL) 10 MG tablet TAKE 1 TABLET BY MOUTH TWICE A DAY 02/27/23   Marcine Matar, MD  loratadine (CLARITIN) 10 MG tablet Take 10 mg by mouth daily as needed for allergies.    [provider]  methocarbamol (ROBAXIN) 500 MG tablet Take 1 tablet (500 mg total) by mouth 2 (two) times daily as needed for muscle spasms. 10/01/21   Cristie Hem, PA-C  potassium chloride (KLOR-CON) 10 MEQ tablet Take 1 tablet (10 mEq total) by mouth daily. Please make PCP appointment. 01/07/23   Marcine Matar, MD    Physical Exam: Vitals:   05/15/23 1445 05/15/23 1545 05/15/23 1600  BP: (!) 135/91 (!) 128/93 (!) 146/89  Pulse: 68 66 66  Resp: (!) 27 20 20   SpO2: 99% 98% 98%    Physical Exam Constitutional:      General: He is not in acute distress.    Comments: Drowsy, on BiPAP when seen, arouses to voice.   HENT:     Head: Normocephalic and atraumatic.     Mouth/Throat:     Mouth: Mucous membranes are moist.     Pharynx: Oropharynx is clear.  Eyes:     Extraocular Movements: Extraocular movements intact.     Pupils: Pupils are equal, round, and reactive to light.  Cardiovascular:     Rate and Rhythm: Normal rate and regular rhythm.     Pulses: Normal pulses.     Heart sounds: Murmur heard.  Pulmonary:     Effort: Pulmonary effort is normal. No respiratory distress.     Breath sounds: Rales (Trace) present.  Abdominal:     General: Bowel sounds are normal. There is no distension.     Palpations: Abdomen is soft.     Tenderness: There is no abdominal tenderness.  Musculoskeletal:        General: No swelling or deformity.     Right lower leg: Edema  present.     Left lower leg: Edema present.  Skin:    General: Skin is warm and dry.  Neurological:     General: No focal deficit present.     Mental Status: Mental status is at baseline.    Labs on Admission: I have personally reviewed following labs and imaging studies  CBC: Recent Labs  Lab 05/15/23 1604  WBC 8.7  NEUTROABS 7.4  HGB 16.7  HCT 51.3  MCV 92.6  PLT 169    Basic Metabolic Panel: Recent Labs  Lab 05/15/23 1604  NA 137  K 3.9  CL 101  CO2 26  GLUCOSE 165*  BUN 12  CREATININE 0.78  CALCIUM 8.8*    GFR: CrCl cannot be calculated (Unknown ideal weight.).  Liver Function Tests: No results for input(s): "AST", "ALT", "ALKPHOS", "BILITOT", "PROT", "ALBUMIN" in the last 168 hours.  Urine analysis:    Component Value Date/Time   COLORURINE COLORLESS (A) 11/22/2018 1900   APPEARANCEUR CLEAR 11/22/2018 1900   LABSPEC 1.002 (L) 11/22/2018 1900   PHURINE 6.0 11/22/2018 1900   GLUCOSEU NEGATIVE 11/22/2018 1900   HGBUR NEGATIVE 11/22/2018 1900   BILIRUBINUR NEGATIVE 11/22/2018 1900   KETONESUR NEGATIVE 11/22/2018 1900   PROTEINUR NEGATIVE 11/22/2018 1900   NITRITE NEGATIVE 11/22/2018 1900    LEUKOCYTESUR NEGATIVE 11/22/2018 1900    Radiological Exams on Admission: DG Chest Portable 1 View Result Date: 05/15/2023 CLINICAL DATA:  Shortness of breath EXAM: PORTABLE CHEST - 1 VIEW COMPARISON:  11/22/2018 FINDINGS: Low lung volumes. Probable pulmonary vascular congestion. Crowding of bronchovascular structures most marked in the lung bases, can not exclude mild edema or interstitial infiltrates. Blunting of left lateral costophrenic angle suggesting small effusion. Heart size and mediastinal contours are within normal limits. Aortic Atherosclerosis (ICD10-170.0). Visualized bones unremarkable. IMPRESSION: Low lung volumes with probable pulmonary vascular congestion and small left effusion. Electronically Signed   By: Corlis Leak M.D.   On: 05/15/2023 15:26   EKG: Independently reviewed.  Sinus rhythm at 74 bpm.  Intraventricular conduction delay consistent with left bundle branch block, QRS 124.  Nonspecific T wave changes.  QRS and morphology similar to bundle branch block similar to previous.  Assessment/Plan Principal Problem:   Acute on chronic diastolic (congestive) heart failure (HCC) Active Problems:   Essential hypertension   Aortic valve stenosis, nonrheumatic   Mitral valve regurgitation   Coronary artery disease involving native coronary artery of native heart without angina pectoris   Dyslipidemia   Chronic diastolic CHF Severe aortic stenosis > Last echo in October 2024 with EF recovered to 55%, G2 DD, normal RV function.  Severe aortic stenosis. > Not currently on diuretics for CHF.  Due to severity of aortic stenosis has been considered a candidate for possible aortic valve replacement if symptomatic. > Several days of worsening shortness of breath acutely worsening over the hour prior to presenting today.  Due to increased work of breathing and hypoxia transition to CPAP by EMS and then BiPAP in the ED, unable to tolerate coming off BiPAP on recent trial in ED. >  Concerned that his aortic stenosis may be the driver of this exacerbation as he is not on diuretic per outpatient records.  BNP 2270, troponin 34, repeat pending.  Trace rales and minimal edema on exam. > EDP has agreed to reach out to cardiology. - Monitor on progressive overnight - Appreciate cardiology recommendations and assistance - Continue BiPAP, wean as tolerated - Hold off on further Lasix, pending initial response and  cardiology evaluation - Check magnesium - Trend renal function and electrolytes - Further workup per cardiology recommendations  Hypertension - Continue amlodipine, carvedilol, lisinopril  Hyperlipidemia - On Repatha outpatient  CAD > Status stenting x 2 in 2018 - Continue ASA, carvedilol, lisinopril - On [facility]atha outpatient as above  DVT prophylaxis: SCDs for now pending cardiology input Code Status:   Full Family Communication:  Updated at bedside Disposition Plan:   Patient is from:  Home  Anticipated DC to:  Home  Anticipated DC date:  2 - 10 days  Anticipated DC barriers: None  Consults called:  Cardiology, consult pending. Admission status:  Inpatient, compressive  Severity of Illness: The appropriate patient status for this patient is INPATIENT. Inpatient status is judged to be reasonable and necessary in order to provide the required intensity of service to ensure the patient's safety. The patient's presenting symptoms, physical exam findings, and initial radiographic and laboratory data in the context of their chronic comorbidities is felt to place them at high risk for further clinical deterioration. Furthermore, it is not anticipated that the patient will be medically stable for discharge from the hospital within 2 midnights of admission.   * I certify that at the point of admission it is my clinical judgment that the patient will require inpatient hospital care spanning beyond 2 midnights from the point of admission due to high intensity of  service, high risk for further deterioration and high frequency of surveillance required.Synetta Fail MD [facility]  How to contact the [facility] Attending or Consulting provider 7A - 7P or covering provider during after hours 7P -7A, for this patient?   Check the care team in [facility] and look for a) attending/consulting [facility] provider listed and b) the [facility] team listed Log into [email or web address] and use [place]'s universal password to access. If you do not have the password, please contact the hospital operator. Locate the [facility] provider you are looking for under [facility] and page to a number that you can be directly reached. If you still have difficulty reaching the provider, please page the [facility] (Director on Call) for the Hospitalists listed on amion for assistance.  05/15/2023, 6:49 PM

## 2023-05-15 NOTE — Telephone Encounter (Signed)
The pt's sister contacted the office to arrange appointment for the patient to see Dr Clifton James.  She states that the pt is very difficult to contact by phone.  I scheduled the patient for appointment on 2/6 with Dr Clifton James for evaluation.  The pt's sister states that she has noticed that his color hasn't been good for a few weeks and he is weak.  I advised her that if the patient develops new or worsening symptoms he should proceed to the ER.  I also asked the to check with the patient and see if he has had any bleeding issues or noticed any blood in bowel movements.

## 2023-05-15 NOTE — ED Provider Notes (Signed)
[facility] Provider Note   CSN: [id number] Arrival date & time: 05/15/23  1435     History  Chief Complaint  Patient presents with   Respiratory Distress    Joshua [name] is a 63 y.o. male presenting to the ED with abrupt onset shortness of breath.  History of heart failure.  Breathing worsening today.  No history of smoking per patient report. 70% on room air per EMS< arrives on CPAP  He has a history of 2 cardiac stents in the past.  He also history of severe aortic stenosis.  Per my review of medical records including November cardiology evaluation, he is a likely candidate for aortic valve replacement if he becomes symptomatic with this.  HPI     Home Medications Prior to Admission medications   Medication Sig Start Date End Date Taking? Authorizing Provider  amLODipine (NORVASC) 5 MG tablet Take 1 tablet (5 mg total) by mouth daily. Please make a PCP appointment for more refills. 01/07/23   Marcine Matar, MD  amoxicillin (AMOXIL) 500 MG tablet Take 4 tablets (2,000 mg total) by mouth once as needed for up to 1 dose (Take one hour prior to dental procedure). 01/30/23   Croitoru, Mihai, MD  ASPIRIN 81 PO Take 1 tablet by mouth daily.    [provider]  carvedilol (COREG) 3.125 MG tablet Take 1 tablet (3.125 mg total) by mouth 2 (two) times daily. 04/22/23   Marcine Matar, MD  Evolocumab (REPATHA SURECLICK) 140 MG/ML SOAJ INJECT 1 ML INTO THE SKIN EVERY 14 (FOURTEEN) DAYS. 11/10/22   Croitoru, Mihai, MD  fluticasone (FLONASE) 50 MCG/ACT nasal spray PLACE 1 SPRAY INTO BOTH NOSTRILS DAILY AS NEEDED FOR ALLERGIES OR RHINITIS. 09/01/22   Marcine Matar, MD  lisinopril (ZESTRIL) 10 MG tablet TAKE 1 TABLET BY MOUTH TWICE A DAY 02/27/23   Marcine Matar, MD  loratadine (CLARITIN) 10 MG tablet Take 10 mg by mouth daily as needed for allergies.    [provider]  methocarbamol (ROBAXIN) 500 MG tablet Take  1 tablet (500 mg total) by mouth 2 (two) times daily as needed for muscle spasms. 10/01/21   Cristie Hem, PA-C  potassium chloride (KLOR-CON) 10 MEQ tablet Take 1 tablet (10 mEq total) by mouth daily. Please make PCP appointment. 01/07/23   Marcine Matar, MD      Allergies    Statins    Review of Systems   Review of Systems  Physical Exam Updated Vital Signs BP (!) 135/91   Pulse 68   Resp (!) 27   SpO2 99%  Physical Exam Constitutional:      General: He is not in acute distress. HENT:     Head: Normocephalic and atraumatic.  Eyes:     Conjunctiva/sclera: Conjunctivae normal.     Pupils: Pupils are equal, round, and reactive to light.  Cardiovascular:     Rate and Rhythm: Regular rhythm. Tachycardia present.  Pulmonary:     Effort: Pulmonary effort is normal. No respiratory distress.     Comments: Patient on CPAP machine, rhonchi bilaterally, speaking in short sentences Abdominal:     General: There is no distension.     Tenderness: There is no abdominal tenderness.  Skin:    General: Skin is warm and dry.  Neurological:     General: No focal deficit present.     Mental Status: He is alert. Mental status is at baseline.  Psychiatric:  Mood and Affect: Mood normal.        Behavior: Behavior normal.     ED Results / Procedures / Treatments   Labs (all labs ordered are listed, but only abnormal results are displayed) Labs Reviewed  RESP PANEL BY RT-PCR (RSV, FLU A&B, COVID)  [id number]  BASIC METABOLIC PANEL  CBC WITH DIFFERENTIAL/PLATELET  BRAIN NATRIURETIC PEPTIDE  TROPONIN I (HIGH SENSITIVITY)    EKG EKG Interpretation Date/Time:  Friday May 15 2023 15:28:04 EST Ventricular Rate:  64 PR Interval:  196 QRS Duration:  124 QT Interval:  462 QTC Calculation: 477 R Axis:   28  Text Interpretation: Sinus rhythm IVCD, consider atypical LBBB pvcs have resolved Confirmed by Benjiman Core [phone] on 05/15/2023 3:31:19 PM  Radiology DG Chest  Portable 1 View Result Date: 05/15/2023 CLINICAL DATA:  Shortness of breath EXAM: PORTABLE CHEST - 1 VIEW COMPARISON:  11/22/2018 FINDINGS: Low lung volumes. Probable pulmonary vascular congestion. Crowding of bronchovascular structures most marked in the lung bases, can not exclude mild edema or interstitial infiltrates. Blunting of left lateral costophrenic angle suggesting small effusion. Heart size and mediastinal contours are within normal limits. Aortic Atherosclerosis (ICD10-170.0). Visualized bones unremarkable. IMPRESSION: Low lung volumes with probable pulmonary vascular congestion and small left effusion. Electronically Signed   By: Corlis Leak M.D.   On: 05/15/2023 15:26    Procedures .Critical Care  Performed by: Terald Sleeper, MD Authorized by: Terald Sleeper, MD   Critical care provider statement:    Critical care time (minutes):  30   Critical care time was exclusive of:  Separately billable procedures and treating other patients   Critical care was necessary to treat or prevent imminent or life-threatening deterioration of the following conditions:  Respiratory failure   Critical care was time spent personally by me on the following activities:  Ordering and performing treatments and interventions, ordering and review of laboratory studies, ordering and review of radiographic studies, pulse oximetry, review of old charts, examination of patient and evaluation of patient's response to treatment     Medications Ordered in ED Medications  furosemide (LASIX) injection 60 mg (60 mg Intravenous Given 05/15/23 1528)    ED Course/ Medical Decision Making/ A&P                                 Medical Decision Making Amount and/or Complexity of Data Reviewed Labs: ordered. Radiology: ordered. ECG/medicine tests: ordered.  Risk Prescription drug management.   This patient presents to the ED with concern for acute onset SOB. This involves an extensive number of treatment  options, and is a complaint that carries with it a high risk of complications and morbidity.  The differential diagnosis includes pulmonary edema vs CHF vs pleural effusion vs PNA vs other  Co-morbidities that complicate the patient evaluation: hx of AS at risk of cardiogenic complications  Additional history obtained from EMS  External records from outside source obtained and reviewed including last echo, cardiology office evaluation  I ordered and personally interpreted labs.  The pertinent results include:  pending at signout  I ordered imaging studies including dg chest I independently visualized and interpreted imaging which showed pulmonary congestion I agree with the radiologist interpretation    After the interventions noted above, I reevaluated the patient and found that they have: improved  Dispostion:  Patient signed out to Dr Rubin Payor EDP pending follow up on imaging, labs.  Stable  on bipap         Final Clinical Impression(s) / ED Diagnoses Final diagnoses:  None    Rx / DC Orders ED Discharge Orders     None         Terald Sleeper, MD 05/15/23 [phone]

## 2023-05-15 NOTE — Telephone Encounter (Signed)
Richelle Ito, RN  [facility] [phone] minutes ago (7:57 AM)    Triage call I returned the other day. Was sent to me on day off. Can one of you call back?  (See phone note already in triage) Thanks!   Marilynn Rail, RN routed conversation to Richelle Ito, [place] hours ago (4:11 PM)   Georgette Dover, Aneta Mins routed conversation to [employer] hours ago (3:52 PM)   Pough, Phillip16 hours ago (3:52 PM)   PP patient is returning phone call

## 2023-05-15 NOTE — ED Provider Notes (Signed)
  Physical Exam  BP (!) 146/89   Pulse 66   Resp 20   SpO2 98%   Physical Exam  Procedures  Procedures  ED Course / MDM    Medical Decision Making Amount and/or Complexity of Data Reviewed Labs: ordered. Radiology: ordered. ECG/medicine tests: ordered.  Risk Prescription drug management.  Received in signout.  Shortness of breath.  Reported sats down to 70s.  History of severe aortic stenosis.  May need valve replacement.  Requiring BiPAP at this time.  X-ray does show some volume overload.  Has urinated around 2 L so far.  Still requiring BiPAP.  Will admit.        Benjiman Core, MD 05/15/23 1806

## 2023-05-16 DIAGNOSIS — I5033 Acute on chronic diastolic (congestive) heart failure: Secondary | ICD-10-CM | POA: Diagnosis not present

## 2023-05-16 LAB — COMPREHENSIVE METABOLIC PANEL
ALT: 60 U/L — ABNORMAL HIGH (ref 0–44)
AST: 28 U/L (ref 15–41)
Albumin: 3.2 g/dL — ABNORMAL LOW (ref 3.5–5.0)
Alkaline Phosphatase: 95 U/L (ref 38–126)
Anion gap: 10 (ref 5–15)
BUN: 11 mg/dL (ref 8–23)
CO2: 27 mmol/L (ref 22–32)
Calcium: 8.8 mg/dL — ABNORMAL LOW (ref 8.9–10.3)
Chloride: 105 mmol/L (ref 98–111)
Creatinine, Ser: 0.93 mg/dL (ref 0.61–1.24)
GFR, Estimated: >60 mL/min (ref >60)
Glucose, Bld: 121 mg/dL — ABNORMAL HIGH (ref 70–99)
Potassium: 4 mmol/L (ref 3.5–5.1)
Sodium: 142 mmol/L (ref 135–145)
Total Bilirubin: 1.2 mg/dL (ref 0.0–1.2)
Total Protein: 5.9 g/dL — ABNORMAL LOW (ref 6.5–8.1)

## 2023-05-16 LAB — CBC
HCT: 47.3 % (ref 39.0–52.0)
Hemoglobin: 15.4 g/dL (ref 13.0–17.0)
MCH: 30.1 pg (ref 26.0–34.0)
MCHC: 32.6 g/dL (ref 30.0–36.0)
MCV: 92.4 fL (ref 80.0–100.0)
Platelets: 157 10*3/uL (ref 150–400)
RBC: 5.12 MIL/uL (ref 4.22–5.81)
RDW: 13.6 % (ref 11.5–15.5)
WBC: 7.7 10*3/uL (ref 4.0–10.5)
nRBC: 0 % (ref 0.0–0.2)

## 2023-05-16 MED ORDER — FUROSEMIDE 10 MG/ML IJ SOLN
40.0000 mg | Freq: Two times a day (BID) | INTRAMUSCULAR | Status: DC
  Administered 2023-05-16 – 2023-05-17 (×3): 40 mg via INTRAVENOUS
  Filled 2023-05-16 (×3): qty 4

## 2023-05-16 MED ORDER — ENOXAPARIN SODIUM 40 MG/0.4ML IJ SOSY
40.0000 mg | PREFILLED_SYRINGE | INTRAMUSCULAR | Status: DC
Start: 2023-05-17 — End: 2023-05-18
  Administered 2023-05-17 – 2023-05-18 (×2): 40 mg via SUBCUTANEOUS
  Filled 2023-05-16 (×3): qty 0.4

## 2023-05-16 MED ORDER — ORAL CARE MOUTH RINSE: 15.0000 mL | OROMUCOSAL | Status: DC | PRN

## 2023-05-16 MED ORDER — POTASSIUM CHLORIDE CRYS ER 20 MEQ PO TBCR
40.0000 meq | EXTENDED_RELEASE_TABLET | Freq: Once | ORAL | Status: AC
  Administered 2023-05-16: 40 meq via ORAL
  Filled 2023-05-16: qty 2

## 2023-05-16 NOTE — Plan of Care (Signed)

## 2023-05-16 NOTE — Consult Note (Signed)
Cardiology Consultation   Patient ID: Joshua [name] MRN: [id number]; DOB: May 09, 1960  Admit date: 05/15/2023 Date of Consult: 05/16/2023  PCP:  Marcine Matar, MD   [facility] Providers Cardiologist:  Thurmon Fair, MD        Patient Profile:   Joshua [name] is a 63 y.o. male with a hx of hypertension, hyperlipidemia, coronary artery disease, diastolic heart failure, severe aortic stenosis who is being seen 05/16/2023 for the evaluation of shortness of breath at the request of Zannie Cove.  History of Present Illness:   [name] has a past history as above.  He has severe aortic stenosis.  Over the last 10 days, he has had increasing shortness of breath.  He has not had lower extremity edema, but has felt abdominal bloating.  He has had no chest pain.  More acutely, over the last day or 2 prior to admission, he has been unable to lie flat.  He has been sleeping in a chair, and thus he presented to the hospital.  He had been previously evaluated for his aortic stenosis with thoughts of potentially needing open AVR versus TAVR.   Past Medical History:  Diagnosis Date   Aortic stenosis    CAD (coronary artery disease), native coronary artery 12/26/2016   DES LAD & RCA, EF 25-30%   CHF (congestive heart failure) (HCC)    COVID-19 vaccine regimen to maintain immunity completed 04/27/2020   Dermatitis 01/07/2023   Dyslipidemia    Hypertension    Obesity    Osteoarthritis    Pre-diabetes    a. prior A1C 5.6, states he was on meds for this at one point.   Skin lesions 01/06/2023    Past Surgical History:  Procedure Laterality Date   CORONARY STENT INTERVENTION N/A 12/29/2016   Procedure: CORONARY STENT INTERVENTION;  Surgeon: Corky Crafts, MD;  Location: [facility] INVASIVE CV LAB;  Service: Cardiovascular;  Laterality: N/A;   RIGHT/LEFT HEART CATH AND CORONARY ANGIOGRAPHY N/A 12/29/2016   Procedure: RIGHT/LEFT HEART CATH AND CORONARY  ANGIOGRAPHY;  Surgeon: Corky Crafts, MD;  Location: [facility] INVASIVE CV LAB;  Service: Cardiovascular;  Laterality: N/A;   SHOULDER ARTHROSCOPY WITH ROTATOR CUFF REPAIR AND OPEN BICEPS TENODESIS Right 11/28/2021   Procedure: RIGHT SHOULDER ARTHROSCOPY WITH ROTATOR CUFF REPAIR AND OPEN BICEPS TENODESIS;  Surgeon: Huel Cote, MD;  Location: [facility] OR;  Service: Orthopedics;  Laterality: Right;   SHOULDER ARTHROSCOPY WITH ROTATOR CUFF REPAIR AND OPEN BICEPS TENODESIS Left 07/21/2022   Procedure: LEFT SHOULDER ARTHROSCOPY WITH ROTATOR CUFF REPAIR AND BICEPS TENODESIS;  Surgeon: Huel Cote, MD;  Location: [facility] OR;  Service: Orthopedics;  Laterality: Left;   TONSILLECTOMY     TOTAL HIP ARTHROPLASTY Right 04/26/2018   Procedure: RIGHT TOTAL HIP ARTHROPLASTY ANTERIOR APPROACH;  Surgeon: Tarry Kos, MD;  Location: [facility] OR;  Service: Orthopedics;  Laterality: Right;   TOTAL HIP ARTHROPLASTY Left 11/15/2018   TOTAL HIP ARTHROPLASTY Left 11/15/2018   Procedure: LEFT TOTAL HIP ARTHROPLASTY ANTERIOR APPROACH;  Surgeon: Tarry Kos, MD;  Location: [facility] OR;  Service: Orthopedics;  Laterality: Left;     Home Medications:  Prior to Admission medications   Medication Sig Start Date End Date Taking? Authorizing Provider  amLODipine (NORVASC) 5 MG tablet Take 1 tablet (5 mg total) by mouth daily. Please make a PCP appointment for more refills. 01/07/23   Marcine Matar, MD  amoxicillin (AMOXIL) 500 MG tablet Take 4 tablets (2,000 mg total) by mouth once  as needed for up to 1 dose (Take one hour prior to dental procedure). 01/30/23   Croitoru, Mihai, MD  ASPIRIN 81 PO Take 1 tablet by mouth daily.    [provider]  carvedilol (COREG) 3.125 MG tablet Take 1 tablet (3.125 mg total) by mouth 2 (two) times daily. 04/22/23   Marcine Matar, MD  Evolocumab (REPATHA SURECLICK) 140 MG/ML SOAJ INJECT 1 ML INTO THE SKIN EVERY 14 (FOURTEEN) DAYS. 11/10/22   Croitoru, Mihai, MD  fluticasone (FLONASE) 50 MCG/ACT  nasal spray PLACE 1 SPRAY INTO BOTH NOSTRILS DAILY AS NEEDED FOR ALLERGIES OR RHINITIS. 09/01/22   Marcine Matar, MD  lisinopril (ZESTRIL) 10 MG tablet TAKE 1 TABLET BY MOUTH TWICE A DAY 02/27/23   Marcine Matar, MD  loratadine (CLARITIN) 10 MG tablet Take 10 mg by mouth daily as needed for allergies.    [provider]  methocarbamol (ROBAXIN) 500 MG tablet Take 1 tablet (500 mg total) by mouth 2 (two) times daily as needed for muscle spasms. 10/01/21   Cristie Hem, PA-C  potassium chloride (KLOR-CON) 10 MEQ tablet Take 1 tablet (10 mEq total) by mouth daily. Please make PCP appointment. 01/07/23   Marcine Matar, MD    Inpatient Medications: Scheduled Meds:  aspirin EC  81 mg Oral Daily   carvedilol  3.125 mg Oral BID   furosemide  40 mg Intravenous BID   sodium chloride flush  3 mL Intravenous Q12H   Continuous Infusions:  PRN Meds: acetaminophen **OR** acetaminophen, polyethylene glycol  Allergies:    Allergies  Allergen Reactions   Statins Other (See Comments)    Elevated LFTs    Social History:   Social History   Socioeconomic History   Marital status: Single    Spouse name: Not on file   Number of children: 0   Years of education: Not on file   Highest education level: Not on file  Occupational History   Occupation: Retired-Upholstery  Tobacco Use   Smoking status: Former    Current packs/day: 0.00    Types: Cigarettes    Quit date: 08/18/2000    Years since quitting: 22.7    Passive exposure: Past   Smokeless tobacco: Never   Tobacco comments:    Smoked lightly for approx 20 years, quit 1990s  Vaping Use   Vaping status: Never Used  Substance and Sexual Activity   Alcohol use: Yes    Comment: occasionally   Drug use: Yes    Types: Marijuana    Comment: every night (to sleep)   Sexual activity: Not on file  Other Topics Concern   Not on file  Social History Narrative   Lives Home alone. Sister makes all health related decisions.  Declined Advanced directive at this time   Social Drivers of Corporate investment banker Strain: Not on file  Food Insecurity: Not on file  Transportation Needs: Not on file  Physical Activity: Not on file  Stress: Not on file  Social Connections: Not on file  Intimate Partner Violence: Not on file    Family History:    Family History  Problem Relation Age of Onset   CAD Father        MI/CABG age 48     ROS:  Please see the history of present illness.   All other ROS reviewed and negative.     Physical Exam/Data:   Vitals:   05/16/23 1000 05/16/23 1015 05/16/23 1045 05/16/23 1050  BP:  105/76 102/76 108/74   Pulse: 65 67 64   Resp: (!) 27 20 (!) 22   Temp:    97.6 F (36.4 C)  TempSrc:    Temporal  SpO2: 98% 99% 97%     Intake/Output Summary (Last 24 hours) at 05/16/2023 1116 Last data filed at 05/16/2023 1050 Gross per 24 hour  Intake --  Output 2700 ml  Net -2700 ml      02/18/2023    9:43 AM 01/07/2023    2:02 PM 01/02/2023    8:45 AM  Last 3 Weights  Weight (lbs) 233 lb 3.2 oz 221 lb 226 lb  Weight (kg) 105.779 kg 100.245 kg 102.513 kg     There is no height or weight on file to calculate BMI.  General:  Well nourished, well developed, in no acute distress HEENT: normal Neck: no JVD Vascular: No carotid bruits; Distal pulses 2+ bilaterally Cardiac:  normal S1, S2; RRR; 2/6 systolic murmur at the apex Lungs:  clear to auscultation bilaterally, no wheezing, rhonchi or rales  Abd: soft, nontender, no hepatomegaly  Ext: no edema Musculoskeletal:  No deformities, BUE and BLE strength normal and equal Skin: warm and dry  Neuro:  CNs 2-12 intact, no focal abnormalities noted Psych:  Normal affect   EKG:  The EKG was personally reviewed and demonstrates:  sinus rhythm Telemetry:  Telemetry was personally reviewed and demonstrates:  sinus rhythm  Relevant CV Studies: TTE 10/24  1. Left ventricular ejection fraction, by estimation, is 55%. The left   ventricle has normal function. The left ventricle has no regional wall  motion abnormalities. There is mild concentric left ventricular  hypertrophy. Left ventricular diastolic  parameters are consistent with Grade II diastolic dysfunction  (pseudonormalization).   2. Right ventricular systolic function is normal. The right ventricular  size is mildly enlarged. Tricuspid regurgitation signal is inadequate for  assessing PA pressure.   3. Left atrial size was mildly dilated.   4. The mitral valve is normal in structure. Trivial mitral valve  regurgitation. No evidence of mitral stenosis.   5. The aortic valve is bicuspid. There is moderate calcification of the  aortic valve. Aortic valve regurgitation is mild. Severe aortic valve  stenosis. Aortic valve area, by VTI measures 0.75 cm. Aortic valve mean  gradient measures 53.0 mmHg.   6. Aortic dilatation noted. There is mild dilatation of the ascending  aorta, measuring 38 mm.   7. The inferior vena cava is normal in size with greater than 50%  respiratory variability, suggesting right atrial pressure of 3 mmHg.   Laboratory Data:  High Sensitivity Troponin:   Recent Labs  Lab 05/15/23 1604 05/15/23 1816  TROPONINIHS 34* 32*     Chemistry Recent Labs  Lab 05/15/23 1604 05/15/23 1816 05/16/23 0455  NA 137  --  142  K 3.9  --  4.0  CL 101  --  105  CO2 26  --  27  GLUCOSE 165*  --  121*  BUN 12  --  11  CREATININE 0.78  --  0.93  CALCIUM 8.8*  --  8.8*  MG  --  2.2  --   GFRNONAA >60  --  >60  ANIONGAP 10  --  10    Recent Labs  Lab 05/16/23 0455  PROT 5.9*  ALBUMIN 3.2*  AST 28  ALT 60*  ALKPHOS 95  BILITOT 1.2   Lipids No results for input(s): "CHOL", "TRIG", "HDL", "LABVLDL", "LDLCALC", "CHOLHDL" in the  last 168 hours.  Hematology Recent Labs  Lab 05/15/23 1604 05/16/23 0455  WBC 8.7 7.7  RBC 5.54 5.12  HGB 16.7 15.4  HCT 51.3 47.3  MCV 92.6 92.4  MCH 30.1 30.1  MCHC 32.6 32.6  RDW 13.9 13.6   PLT 169 157   Thyroid No results for input(s): "TSH", "FREET4" in the last 168 hours.  BNP Recent Labs  Lab 05/15/23 1604  BNP 2,270.9*    DDimer No results for input(s): "DDIMER" in the last 168 hours.   Radiology/Studies:  DG Chest Portable 1 View Result Date: 05/15/2023 CLINICAL DATA:  Shortness of breath EXAM: PORTABLE CHEST - 1 VIEW COMPARISON:  11/22/2018 FINDINGS: Low lung volumes. Probable pulmonary vascular congestion. Crowding of bronchovascular structures most marked in the lung bases, can not exclude mild edema or interstitial infiltrates. Blunting of left lateral costophrenic angle suggesting small effusion. Heart size and mediastinal contours are within normal limits. Aortic Atherosclerosis (ICD10-170.0). Visualized bones unremarkable. IMPRESSION: Low lung volumes with probable pulmonary vascular congestion and small left effusion. Electronically Signed   By: Corlis Leak M.D.   On: 05/15/2023 15:26     Assessment and Plan:   Acute on chronic diastolic heart failure: Patient was severely volume overloaded.  Has received IV Lasix with improvement in respiratory status.  He has been weaned off of BiPAP.  Janilah Hojnacki continue his carvedilol.  Jru Pense continue IV Lasix today and potentially transition to p.o. tomorrow. Severe aortic stenosis: Was asymptomatic at his prior visit.  With his heart failure exacerbation, Taige Housman likely need aortic valve addressed this admission.  Sequoyah Counterman have him seen by the valve team on Monday. Coronary artery disease: Post PCI to the LAD and RCA.  Continue aspirin and Coreg. Hypertension: Continue carvedilol and Lasix   Risk Assessment/Risk Scores:        New York Heart Association (NYHA) Functional Class NYHA Class III        For questions or updates, please contact  [facility] Please consult [email or web address] for contact info under    Signed, Maha Fischel Jorja Loa, MD  05/16/2023 11:16 AM

## 2023-05-16 NOTE — ED Notes (Signed)
Please update sister [phone]

## 2023-05-16 NOTE — Progress Notes (Signed)
   05/16/23 0806  Therapy Vitals  Pulse Rate 83  Resp (!) 23  MEWS Score/Color  MEWS Score 1  MEWS Score Color Green  Oxygen Therapy/Pulse Ox  O2 Device (S)  Nasal Cannula  O2 Therapy Oxygen  O2 Flow Rate (L/min) 1 L/min  SpO2 99 %

## 2023-05-16 NOTE — Progress Notes (Addendum)
PROGRESS NOTE    Joshua [name]  ZOX:[id number] DOB: 1960-06-17 DOA: 05/15/2023 PCP: Marcine Matar, MD  62/M with history of CAD, prior PCI and stenting of LAD and RCA in 2018, recently diagnosed severe aortic stenosis, seen by structural heart team in November, however was asymptomatic from this standpoint and recommended follow-up.  Presented to the ED 1/31 with progressive shortness of breath, orthopnea and edema, symptoms started a week ago, significantly worse the day of admission.  In the ER he was hypoxic to 70%, placed on a nonrebreather and then BiPAP.  Workup noted BNP 2270, troponin 34, chest x-ray with pulmonary vascular congestion and small left pleural effusion.  Subjective: -Feels better this morning, off BiPAP  Assessment and Plan:  Acute on chronic diastolic CHF Last echo 10/24 with EF 55%, grade 2 DD, normal RV, severe aortic stenosis -Likely driven by severe aortic stenosis -Continue IV Lasix today -Weaned off BiPAP -Continue carvedilol, discontinue lisinopril  Severe aortic stenosis -Seen by structural heart team Dr. Clifton James in 11/24, was asymptomatic at the time -Will request cardiology input, discussed with Dr. Elberta Fortis  History of CAD -Prior PCI and stenting LAD and RCA in 2018 -Continue aspirin, carvedilol  Hypertension -Continue carvedilol, diuretics as above, discontinue amlodipine and lisinopril  Hyperglycemia -Check HbA1c  Daily cannabis use -Counseled  DVT prophylaxis: Add Lovenox Code Status: Full code Family Communication: No family at bedside Disposition Plan: Home pending above workup  Consultants:    Procedures:   Antimicrobials:    Objective: Vitals:   05/16/23 0750 05/16/23 0806 05/16/23 0900 05/16/23 0915  BP:   114/84 113/80  Pulse:  83 70 88  Resp:  (!) 23 (!) 30 19  Temp: 97.7 F (36.5 C)     TempSrc: Temporal     SpO2:  99% 98% 97%    Intake/Output Summary (Last 24 hours) at 05/16/2023 0926 Last data filed  at 05/16/2023 0630 Gross per 24 hour  Intake --  Output 1900 ml  Net -1900 ml   There were no vitals filed for this visit.  Examination:  General exam: AAOx3, no distress HEENT: Positive JVD CVS: S1-S2, regular rhythm, systolic murmur Lungs: Few basilar Rales Abdomen: Soft, nontender, bowel sounds present Extremities: 1+ edema Skin: No rashes Psychiatry:  Mood & affect appropriate.     Data Reviewed:   CBC: Recent Labs  Lab 05/15/23 1604 05/16/23 0455  WBC 8.7 7.7  NEUTROABS 7.4  --   HGB 16.7 15.4  HCT 51.3 47.3  MCV 92.6 92.4  PLT 169 157   Basic Metabolic Panel: Recent Labs  Lab 05/15/23 1604 05/15/23 1816 05/16/23 0455  NA 137  --  142  K 3.9  --  4.0  CL 101  --  105  CO2 26  --  27  GLUCOSE 165*  --  121*  BUN 12  --  11  CREATININE 0.78  --  0.93  CALCIUM 8.8*  --  8.8*  MG  --  2.2  --    GFR: CrCl cannot be calculated (Unknown ideal weight.). Liver Function Tests: Recent Labs  Lab 05/16/23 0455  AST 28  ALT 60*  ALKPHOS 95  BILITOT 1.2  PROT 5.9*  ALBUMIN 3.2*   No results for input(s): "LIPASE", "AMYLASE" in the last 168 hours. No results for input(s): "AMMONIA" in the last 168 hours. Coagulation Profile: No results for input(s): "INR", "PROTIME" in the last 168 hours. Cardiac Enzymes: No results for input(s): "CKTOTAL", "CKMB", "CKMBINDEX", "TROPONINI"  in the last 168 hours. BNP (last 3 results) No results for input(s): "PROBNP" in the last 8760 hours. HbA1C: No results for input(s): "HGBA1C" in the last 72 hours. CBG: No results for input(s): "GLUCAP" in the last 168 hours. Lipid Profile: No results for input(s): "CHOL", "HDL", "LDLCALC", "TRIG", "CHOLHDL", "LDLDIRECT" in the last 72 hours. Thyroid Function Tests: No results for input(s): "TSH", "T4TOTAL", "FREET4", "T3FREE", "THYROIDAB" in the last 72 hours. Anemia Panel: No results for input(s): "VITAMINB12", "FOLATE", "FERRITIN", "TIBC", "IRON", "RETICCTPCT" in the last 72  hours. Urine analysis:    Component Value Date/Time   COLORURINE COLORLESS (A) 11/22/2018 1900   APPEARANCEUR CLEAR 11/22/2018 1900   LABSPEC 1.002 (L) 11/22/2018 1900   PHURINE 6.0 11/22/2018 1900   GLUCOSEU NEGATIVE 11/22/2018 1900   HGBUR NEGATIVE 11/22/2018 1900   BILIRUBINUR NEGATIVE 11/22/2018 1900   KETONESUR NEGATIVE 11/22/2018 1900   PROTEINUR NEGATIVE 11/22/2018 1900   NITRITE NEGATIVE 11/22/2018 1900   LEUKOCYTESUR NEGATIVE 11/22/2018 1900   Sepsis Labs: @LABRCNTIP (procalcitonin:4,lacticidven:4)  ) Recent Results (from the past 240 hours)  Resp panel by RT-PCR (RSV, Flu A&B, Covid) Anterior Nasal Swab     Status: None   Collection Time: 05/15/23  2:43 PM   Specimen: Anterior Nasal Swab  Result Value Ref Range Status   SARS Coronavirus 2 by RT PCR NEGATIVE NEGATIVE Final   Influenza A by PCR NEGATIVE NEGATIVE Final   Influenza B by PCR NEGATIVE NEGATIVE Final    Comment: (NOTE) The Xpert Xpress SARS-CoV-2/FLU/RSV plus assay is intended as an aid in the diagnosis of influenza from Nasopharyngeal swab specimens and should not be used as a sole basis for treatment. Nasal washings and aspirates are unacceptable for Xpert Xpress SARS-CoV-2/FLU/RSV testing.  Fact Sheet for Patients: BloggerCourse.com  Fact Sheet for Healthcare Providers: SeriousBroker.it  This test is not yet approved or cleared by the [place] FDA and has been authorized for detection and/or diagnosis of SARS-CoV-2 by FDA under an Emergency Use Authorization (EUA). This EUA will remain in effect (meaning this test can be used) for the duration of the COVID-19 declaration under Section 564(b)(1) of the Act, 21 U.S.C. section 360bbb-3(b)(1), unless the authorization is terminated or revoked.     Resp Syncytial Virus by PCR NEGATIVE NEGATIVE Final    Comment: (NOTE) Fact Sheet for Patients: BloggerCourse.com  Fact  Sheet for Healthcare Providers: SeriousBroker.it  This test is not yet approved or cleared by the [place] FDA and has been authorized for detection and/or diagnosis of SARS-CoV-2 by FDA under an Emergency Use Authorization (EUA). This EUA will remain in effect (meaning this test can be used) for the duration of the COVID-19 declaration under Section 564(b)(1) of the Act, 21 U.S.C. section 360bbb-3(b)(1), unless the authorization is terminated or revoked.  Performed at [facility], [address] [place], [place] [address]      Radiology Studies: DG Chest Portable 1 View Result Date: 05/15/2023 CLINICAL DATA:  Shortness of breath EXAM: PORTABLE CHEST - 1 VIEW COMPARISON:  11/22/2018 FINDINGS: Low lung volumes. Probable pulmonary vascular congestion. Crowding of bronchovascular structures most marked in the lung bases, can not exclude mild edema or interstitial infiltrates. Blunting of left lateral costophrenic angle suggesting small effusion. Heart size and mediastinal contours are within normal limits. Aortic Atherosclerosis (ICD10-170.0). Visualized bones unremarkable. IMPRESSION: Low lung volumes with probable pulmonary vascular congestion and small left effusion. Electronically Signed   By: Corlis Leak M.D.   On: 05/15/2023 15:26  Scheduled Meds:  amLODipine  5 mg Oral Daily   aspirin EC  81 mg Oral Daily   carvedilol  3.125 mg Oral BID   lisinopril  10 mg Oral BID   sodium chloride flush  3 mL Intravenous Q12H   Continuous Infusions:   LOS: 1 day    Time spent:    Zannie Cove, MD [facility]   05/16/2023, 9:26 AM

## 2023-05-16 NOTE — ED Notes (Signed)
RN took pt off bipap, RN placed pt pn 2L. Pt showing no signs of distress at this time

## 2023-05-16 NOTE — Plan of Care (Addendum)
Patient remains on MC-3E at time of writing. Patient still requiring 3 L / min of supplemental O2 via [place].  Patient's admission profile completed by this RN overnight. SCDs initiated by this RN overnight. MEWS warning triggered errantly overnight; VS rechecked.    Problem: Education: Goal: Knowledge of General Education information will improve Description: Including pain rating scale, medication(s)/side effects and non-pharmacologic comfort measures Outcome: Progressing   Problem: Health Behavior/Discharge Planning: Goal: Ability to manage health-related needs will improve Outcome: Progressing   Problem: Clinical Measurements: Goal: Ability to maintain clinical measurements within normal limits will improve Outcome: Progressing Goal: Will remain free from infection Outcome: Progressing Goal: Diagnostic test results will improve Outcome: Progressing Goal: Respiratory complications will improve Outcome: Progressing Goal: Cardiovascular complication will be avoided Outcome: Progressing   Problem: Activity: Goal: Risk for activity intolerance will decrease Outcome: Progressing   Problem: Nutrition: Goal: Adequate nutrition will be maintained Outcome: Progressing   Problem: Coping: Goal: Level of anxiety will decrease Outcome: Progressing   Problem: Elimination: Goal: Will not experience complications related to bowel motility Outcome: Progressing Goal: Will not experience complications related to urinary retention Outcome: Progressing   Problem: Pain Managment: Goal: General experience of comfort will improve and/or be controlled Outcome: Progressing   Problem: Safety: Goal: Ability to remain free from injury will improve Outcome: Progressing   Problem: Skin Integrity: Goal: Risk for impaired skin integrity will decrease Outcome: Progressing   Problem: Education: Goal: Ability to demonstrate management of disease process will improve Outcome: Progressing Goal:  Ability to verbalize understanding of medication therapies will improve Outcome: Progressing Goal: Individualized Educational Video(s) Outcome: Progressing   Problem: Activity: Goal: Capacity to carry out activities will improve Outcome: Progressing   Problem: Cardiac: Goal: Ability to achieve and maintain adequate cardiopulmonary perfusion will improve Outcome: Progressing

## 2023-05-17 DIAGNOSIS — I5033 Acute on chronic diastolic (congestive) heart failure: Secondary | ICD-10-CM | POA: Diagnosis not present

## 2023-05-17 LAB — CBC
HCT: 48.8 % (ref 39.0–52.0)
Hemoglobin: 15.8 g/dL (ref 13.0–17.0)
MCH: 30.5 pg (ref 26.0–34.0)
MCHC: 32.4 g/dL (ref 30.0–36.0)
MCV: 94.2 fL (ref 80.0–100.0)
Platelets: 172 10*3/uL (ref 150–400)
RBC: 5.18 MIL/uL (ref 4.22–5.81)
RDW: 13.6 % (ref 11.5–15.5)
WBC: 8.7 10*3/uL (ref 4.0–10.5)
nRBC: 0 % (ref 0.0–0.2)

## 2023-05-17 LAB — COMPREHENSIVE METABOLIC PANEL
ALT: 47 U/L — ABNORMAL HIGH (ref 0–44)
AST: 23 U/L (ref 15–41)
Albumin: 3.2 g/dL — ABNORMAL LOW (ref 3.5–5.0)
Alkaline Phosphatase: 93 U/L (ref 38–126)
Anion gap: 9 (ref 5–15)
BUN: 13 mg/dL (ref 8–23)
CO2: 28 mmol/L (ref 22–32)
Calcium: 8.5 mg/dL — ABNORMAL LOW (ref 8.9–10.3)
Chloride: 104 mmol/L (ref 98–111)
Creatinine, Ser: 1 mg/dL (ref 0.61–1.24)
GFR, Estimated: >60 mL/min (ref >60)
Glucose, Bld: 165 mg/dL — ABNORMAL HIGH (ref 70–99)
Potassium: 3.6 mmol/L (ref 3.5–5.1)
Sodium: 141 mmol/L (ref 135–145)
Total Bilirubin: 0.8 mg/dL (ref 0.0–1.2)
Total Protein: 6 g/dL — ABNORMAL LOW (ref 6.5–8.1)

## 2023-05-17 LAB — BASIC METABOLIC PANEL
Anion gap: 11 (ref 5–15)
BUN: 15 mg/dL (ref 8–23)
CO2: 29 mmol/L (ref 22–32)
Calcium: 8.6 mg/dL — ABNORMAL LOW (ref 8.9–10.3)
Chloride: 101 mmol/L (ref 98–111)
Creatinine, Ser: 1.29 mg/dL — ABNORMAL HIGH (ref 0.61–1.24)
GFR, Estimated: >60 mL/min (ref >60)
Glucose, Bld: 128 mg/dL — ABNORMAL HIGH (ref 70–99)
Potassium: 4.2 mmol/L (ref 3.5–5.1)
Sodium: 141 mmol/L (ref 135–145)

## 2023-05-17 LAB — MAGNESIUM: Magnesium: 2.1 mg/dL (ref 1.7–2.4)

## 2023-05-17 LAB — HEMOGLOBIN A1C
Hgb A1c MFr Bld: 6 % — ABNORMAL HIGH (ref 4.8–5.6)
Mean Plasma Glucose: 125.5 mg/dL

## 2023-05-17 MED ORDER — FUROSEMIDE 10 MG/ML IJ SOLN: 40.0000 mg | Freq: Once | INTRAMUSCULAR | Status: DC

## 2023-05-17 MED ORDER — POTASSIUM CHLORIDE CRYS ER 20 MEQ PO TBCR
40.0000 meq | EXTENDED_RELEASE_TABLET | Freq: Once | ORAL | Status: AC
  Administered 2023-05-17: 40 meq via ORAL
  Filled 2023-05-17: qty 2

## 2023-05-17 MED ORDER — CYCLOBENZAPRINE HCL 5 MG PO TABS
5.0000 mg | ORAL_TABLET | Freq: Three times a day (TID) | ORAL | Status: DC | PRN
  Administered 2023-05-17 – 2023-05-24 (×13): 5 mg via ORAL
  Filled 2023-05-17 (×13): qty 1

## 2023-05-17 MED ORDER — FUROSEMIDE 40 MG PO TABS
40.0000 mg | ORAL_TABLET | Freq: Every day | ORAL | Status: DC
Start: 2023-05-18 — End: 2023-05-18

## 2023-05-17 NOTE — Progress Notes (Signed)
Rounding Note    Patient Name: Joshua [name] Date of Encounter: 05/17/2023  [facility] Cardiologist: Joshua Fair, MD   Subjective   Patient feeling much improved.  Less shortness of breath.  Continues to have fatigue.  Inpatient Medications    Scheduled Meds:  aspirin EC  81 mg Oral Daily   carvedilol  3.125 mg Oral BID   enoxaparin (LOVENOX) injection  40 mg Subcutaneous Q24H   furosemide  40 mg Intravenous BID   sodium chloride flush  3 mL Intravenous Q12H   Continuous Infusions:  PRN Meds: acetaminophen **OR** acetaminophen, mouth rinse, polyethylene glycol   Vital Signs    Vitals:   05/17/23 0031 05/17/23 0415 05/17/23 0739 05/17/23 0900  BP: 116/73 111/75 (!) 123/90   Pulse: 64 71 66   Resp: 20 20 19    Temp: (!) 97.5 F (36.4 C) 97.7 F (36.5 C) 98.7 F (37.1 C)   TempSrc: Oral Oral Oral   SpO2: 97% 95% 96% 92%  Weight:  105.2 kg    Height:        Intake/Output Summary (Last 24 hours) at 05/17/2023 1036 Last data filed at 05/17/2023 1024 Gross per 24 hour  Intake 720 ml  Output 4025 ml  Net -3305 ml      05/17/2023    4:15 AM 05/16/2023    8:00 PM 02/18/2023    9:43 AM  Last 3 Weights  Weight (lbs) 231 lb 14.8 oz 234 lb 9.1 oz 233 lb 3.2 oz  Weight (kg) 105.2 kg 106.4 kg 105.779 kg      Telemetry    Sinus rhythm, overnight complete heart block- Personally Reviewed  ECG    None new- Personally Reviewed  Physical Exam   GEN: No acute distress.   Neck: No JVD Cardiac: RRR, 2/6 systolic murmur at the base.  Respiratory: Clear to auscultation bilaterally. GI: Soft, nontender, non-distended  MS: No edema; No deformity. Neuro:  Nonfocal  Psych: Normal affect   Labs    High Sensitivity Troponin:   Recent Labs  Lab 05/15/23 1604 05/15/23 1816  TROPONINIHS 34* 32*     Chemistry Recent Labs  Lab 05/15/23 1604 05/15/23 1816 05/16/23 0455 05/17/23 0250  NA 137  --  142 141  K 3.9  --  4.0 3.6  CL 101  --  105 104   CO2 26  --  27 28  GLUCOSE 165*  --  121* 165*  BUN 12  --  11 13  CREATININE 0.78  --  0.93 1.00  CALCIUM 8.8*  --  8.8* 8.5*  MG  --  2.2  --   --   PROT  --   --  5.9* 6.0*  ALBUMIN  --   --  3.2* 3.2*  AST  --   --  28 23  ALT  --   --  60* 47*  ALKPHOS  --   --  95 93  BILITOT  --   --  1.2 0.8  GFRNONAA >60  --  >60 >60  ANIONGAP 10  --  10 9    Lipids No results for input(s): "CHOL", "TRIG", "HDL", "LABVLDL", "LDLCALC", "CHOLHDL" in the last 168 hours.  Hematology Recent Labs  Lab 05/15/23 1604 05/16/23 0455 05/17/23 0250  WBC 8.7 7.7 8.7  RBC 5.54 5.12 5.18  HGB 16.7 15.4 15.8  HCT 51.3 47.3 48.8  MCV 92.6 92.4 94.2  MCH 30.1 30.1 30.5  MCHC 32.6 32.6 32.4  RDW 13.9 13.6 13.6  PLT 169 157 172   Thyroid No results for input(s): "TSH", "FREET4" in the last 168 hours.  BNP Recent Labs  Lab 05/15/23 1604  BNP 2,270.9*    DDimer No results for input(s): "DDIMER" in the last 168 hours.   Radiology    DG Chest Portable 1 View Result Date: 05/15/2023 CLINICAL DATA:  Shortness of breath EXAM: PORTABLE CHEST - 1 VIEW COMPARISON:  11/22/2018 FINDINGS: Low lung volumes. Probable pulmonary vascular congestion. Crowding of bronchovascular structures most marked in the lung bases, can not exclude mild edema or interstitial infiltrates. Blunting of left lateral costophrenic angle suggesting small effusion. Heart size and mediastinal contours are within normal limits. Aortic Atherosclerosis (ICD10-170.0). Visualized bones unremarkable. IMPRESSION: Low lung volumes with probable pulmonary vascular congestion and small left effusion. Electronically Signed   By: Corlis Leak M.D.   On: 05/15/2023 15:26    Cardiac Studies   TTE 10/24  1. Left ventricular ejection fraction, by estimation, is 55%. The left  ventricle has normal function. The left ventricle has no regional wall  motion abnormalities. There is mild concentric left ventricular  hypertrophy. Left ventricular  diastolic  parameters are consistent with Grade II diastolic dysfunction  (pseudonormalization).   2. Right ventricular systolic function is normal. The right ventricular  size is mildly enlarged. Tricuspid regurgitation signal is inadequate for  assessing PA pressure.   3. Left atrial size was mildly dilated.   4. The mitral valve is normal in structure. Trivial mitral valve  regurgitation. No evidence of mitral stenosis.   5. The aortic valve is bicuspid. There is moderate calcification of the  aortic valve. Aortic valve regurgitation is mild. Severe aortic valve  stenosis. Aortic valve area, by VTI measures 0.75 cm. Aortic valve mean  gradient measures 53.0 mmHg.   6. Aortic dilatation noted. There is mild dilatation of the ascending  aorta, measuring 38 mm.   7. The inferior vena cava is normal in size with greater than 50%  respiratory variability, suggesting right atrial pressure of 3 mmHg.   Patient Profile     63 y.o. male with a history of aortic stenosis presented to the hospital with acute diastolic heart failure  Assessment & Plan    1.  Acute on chronic diastolic heart failure: Patient was initially on BiPAP.  Currently on IV Lasix with good urine output.  Creatinine has remained stable.  He is feeling improved.  Joshua [name] give 1 further dose of IV Lasix today and transition to p.o. Lasix.  2.  Severe aortic stenosis: With heart failure exacerbation, Joshua [name] need AVR.  Joshua [name] have him evaluated by the valve team on Monday  3.  Coronary artery disease: Post PCI to the LAD and RCA.  Continue aspirin and Coreg  4.  Hypertension: Well-controlled     For questions or updates, please contact [name] Please consult [email or web address] for contact info under        Signed, Joshua Dragon Jorja Loa, MD  05/17/2023, 10:36 AM

## 2023-05-17 NOTE — Plan of Care (Signed)

## 2023-05-17 NOTE — Progress Notes (Signed)
PROGRESS NOTE    Joshua [name]  WNU:[id number] DOB: 1960/11/25 DOA: 05/15/2023 PCP: Marcine Matar, MD  62/M with history of CAD, prior PCI and stenting of LAD and RCA in 2018, recently diagnosed severe aortic stenosis, seen by structural heart team in November, however was asymptomatic from this standpoint and recommended follow-up.  Presented to the ED 1/31 with progressive shortness of breath, orthopnea and edema, symptoms started a week ago, significantly worse the day of admission.  In the ER he was hypoxic to 70%, placed on a nonrebreather and then BiPAP.  Workup noted BNP 2270, troponin 34, chest x-ray with pulmonary vascular congestion and small left pleural effusion.  Subjective: -Feels better this morning, off BiPAP  Assessment and Plan:  Acute on chronic diastolic CHF Last echo 10/24 with EF 55%, grade 2 DD, normal RV, severe aortic stenosis -Likely driven by severe aortic stenosis, required BiPAP on admission -Improving with diuresis, 5.3 L negative, remains volume overloaded, continue IV Lasix today, continue carvedilol, avoid excessive GDMT with severe AS -Wean O2, increase activity  Severe aortic stenosis -Seen by structural heart team Dr. Clifton James in 11/24, was asymptomatic at the time -Cards following, plan for structural heart team eval  History of CAD -Prior PCI and stenting LAD and RCA in 2018 -Continue aspirin, carvedilol  Hypertension -Continue carvedilol, diuretics as above, discontinue amlodipine and lisinopril  Hyperglycemia, borderline diabetes -A1 C6.0,  Daily cannabis use -Counseled  DVT prophylaxis: Lovenox Code Status: Full code Family Communication: No family at bedside Disposition Plan: Home pending above workup  Consultants:    Procedures:   Antimicrobials:    Objective: Vitals:   05/17/23 0031 05/17/23 0415 05/17/23 0739 05/17/23 0900  BP: 116/73 111/75 (!) 123/90   Pulse: 64 71 66   Resp: 20 20 19    Temp: (!) 97.5 F  (36.4 C) 97.7 F (36.5 C) 98.7 F (37.1 C)   TempSrc: Oral Oral Oral   SpO2: 97% 95% 96% 92%  Weight:  105.2 kg    Height:        Intake/Output Summary (Last 24 hours) at 05/17/2023 1048 Last data filed at 05/17/2023 1024 Gross per 24 hour  Intake 720 ml  Output 4025 ml  Net -3305 ml   Filed Weights   05/16/23 2000 05/17/23 0415  Weight: 106.4 kg 105.2 kg    Examination:  General exam: AAOx3, no distress HEENT: Positive JVD CVS: S1-S2, systolic murmur Lungs: Few basilar Rales Abdomen: Soft, nontender, bowel sounds present Extremities: 1-2+ edema Skin: No rashes Psychiatry:  Mood & affect appropriate.     Data Reviewed:   CBC: Recent Labs  Lab 05/15/23 1604 05/16/23 0455 05/17/23 0250  WBC 8.7 7.7 8.7  NEUTROABS 7.4  --   --   HGB 16.7 15.4 15.8  HCT 51.3 47.3 48.8  MCV 92.6 92.4 94.2  PLT 169 157 172   Basic Metabolic Panel: Recent Labs  Lab 05/15/23 1604 05/15/23 1816 05/16/23 0455 05/17/23 0250  NA 137  --  142 141  K 3.9  --  4.0 3.6  CL 101  --  105 104  CO2 26  --  27 28  GLUCOSE 165*  --  121* 165*  BUN 12  --  11 13  CREATININE 0.78  --  0.93 1.00  CALCIUM 8.8*  --  8.8* 8.5*  MG  --  2.2  --   --    GFR: Estimated Creatinine Clearance: 95 mL/min (by C-G formula based on SCr of  1 mg/dL). Liver Function Tests: Recent Labs  Lab 05/16/23 0455 05/17/23 0250  AST 28 23  ALT 60* 47*  ALKPHOS 95 93  BILITOT 1.2 0.8  PROT 5.9* 6.0*  ALBUMIN 3.2* 3.2*   No results for input(s): "LIPASE", "AMYLASE" in the last 168 hours. No results for input(s): "AMMONIA" in the last 168 hours. Coagulation Profile: No results for input(s): "INR", "PROTIME" in the last 168 hours. Cardiac Enzymes: No results for input(s): "CKTOTAL", "CKMB", "CKMBINDEX", "TROPONINI" in the last 168 hours. BNP (last 3 results) No results for input(s): "PROBNP" in the last 8760 hours. HbA1C: Recent Labs    05/17/23 0250  HGBA1C 6.0*   CBG: No results for input(s):  "GLUCAP" in the last 168 hours. Lipid Profile: No results for input(s): "CHOL", "HDL", "LDLCALC", "TRIG", "CHOLHDL", "LDLDIRECT" in the last 72 hours. Thyroid Function Tests: No results for input(s): "TSH", "T4TOTAL", "FREET4", "T3FREE", "THYROIDAB" in the last 72 hours. Anemia Panel: No results for input(s): "VITAMINB12", "FOLATE", "FERRITIN", "TIBC", "IRON", "RETICCTPCT" in the last 72 hours. Urine analysis:    Component Value Date/Time   COLORURINE COLORLESS (A) 11/22/2018 1900   APPEARANCEUR CLEAR 11/22/2018 1900   LABSPEC 1.002 (L) 11/22/2018 1900   PHURINE 6.0 11/22/2018 1900   GLUCOSEU NEGATIVE 11/22/2018 1900   HGBUR NEGATIVE 11/22/2018 1900   BILIRUBINUR NEGATIVE 11/22/2018 1900   KETONESUR NEGATIVE 11/22/2018 1900   PROTEINUR NEGATIVE 11/22/2018 1900   NITRITE NEGATIVE 11/22/2018 1900   LEUKOCYTESUR NEGATIVE 11/22/2018 1900   Sepsis Labs: @LABRCNTIP (procalcitonin:4,lacticidven:4)  ) Recent Results (from the past 240 hours)  Resp panel by RT-PCR (RSV, Flu A&B, Covid) Anterior Nasal Swab     Status: None   Collection Time: 05/15/23  2:43 PM   Specimen: Anterior Nasal Swab  Result Value Ref Range Status   SARS Coronavirus 2 by RT PCR NEGATIVE NEGATIVE Final   Influenza A by PCR NEGATIVE NEGATIVE Final   Influenza B by PCR NEGATIVE NEGATIVE Final    Comment: (NOTE) The Xpert Xpress SARS-CoV-2/FLU/RSV plus assay is intended as an aid in the diagnosis of influenza from Nasopharyngeal swab specimens and should not be used as a sole basis for treatment. Nasal washings and aspirates are unacceptable for Xpert Xpress SARS-CoV-2/FLU/RSV testing.  Fact Sheet for Patients: BloggerCourse.com  Fact Sheet for Healthcare Providers: SeriousBroker.it  This test is not yet approved or cleared by the [place] FDA and has been authorized for detection and/or diagnosis of SARS-CoV-2 by FDA under an Emergency Use Authorization  (EUA). This EUA will remain in effect (meaning this test can be used) for the duration of the COVID-19 declaration under Section 564(b)(1) of the Act, 21 U.S.C. section 360bbb-3(b)(1), unless the authorization is terminated or revoked.     Resp Syncytial Virus by PCR NEGATIVE NEGATIVE Final    Comment: (NOTE) Fact Sheet for Patients: BloggerCourse.com  Fact Sheet for Healthcare Providers: SeriousBroker.it  This test is not yet approved or cleared by the [place] FDA and has been authorized for detection and/or diagnosis of SARS-CoV-2 by FDA under an Emergency Use Authorization (EUA). This EUA will remain in effect (meaning this test can be used) for the duration of the COVID-19 declaration under Section 564(b)(1) of the Act, 21 U.S.C. section 360bbb-3(b)(1), unless the authorization is terminated or revoked.  Performed at [facility], [address] [place], [place] [address]      Radiology Studies: DG Chest Portable 1 View Result Date: 05/15/2023 CLINICAL DATA:  Shortness of breath EXAM: PORTABLE CHEST -  1 VIEW COMPARISON:  11/22/2018 FINDINGS: Low lung volumes. Probable pulmonary vascular congestion. Crowding of bronchovascular structures most marked in the lung bases, can not exclude mild edema or interstitial infiltrates. Blunting of left lateral costophrenic angle suggesting small effusion. Heart size and mediastinal contours are within normal limits. Aortic Atherosclerosis (ICD10-170.0). Visualized bones unremarkable. IMPRESSION: Low lung volumes with probable pulmonary vascular congestion and small left effusion. Electronically Signed   By: Corlis Leak M.D.   On: 05/15/2023 15:26     Scheduled Meds:  aspirin EC  81 mg Oral Daily   carvedilol  3.125 mg Oral BID   enoxaparin (LOVENOX) injection  40 mg Subcutaneous Q24H   furosemide  40 mg Intravenous Once   [START ON 05/18/2023] furosemide  40 mg Oral Daily    sodium chloride flush  3 mL Intravenous Q12H   Continuous Infusions:   LOS: 2 days    Time spent:    Zannie Cove, MD [facility]   05/17/2023, 10:48 AM

## 2023-05-17 NOTE — Progress Notes (Addendum)
Patient is complaining about bilateral thigh cramping.  Per chart review patient has been admitted for acute on chronic CHF currently on IV Lasix which can be causing electrolyte derangement provoking muscle cramp. Checking stat BMP and mag level.  Continue Tylenol and Flexeril as needed.  Tereasa Coop, MD [facility] 05/17/2023, 7:38 PM

## 2023-05-18 ENCOUNTER — Other Ambulatory Visit (HOSPITAL_COMMUNITY): Payer: Medicaid Other

## 2023-05-18 ENCOUNTER — Inpatient Hospital Stay (HOSPITAL_COMMUNITY): Payer: Medicaid Other

## 2023-05-18 ENCOUNTER — Encounter (HOSPITAL_COMMUNITY)
Admission: EM | Disposition: A | Discharge status: Home / Self Care | Payer: Self-pay | Source: Home / Self Care | Attending: Internal Medicine

## 2023-05-18 DIAGNOSIS — I5033 Acute on chronic diastolic (congestive) heart failure: Secondary | ICD-10-CM | POA: Diagnosis not present

## 2023-05-18 DIAGNOSIS — I350 Nonrheumatic aortic (valve) stenosis: Secondary | ICD-10-CM | POA: Diagnosis not present

## 2023-05-18 DIAGNOSIS — I10 Essential (primary) hypertension: Secondary | ICD-10-CM | POA: Diagnosis not present

## 2023-05-18 DIAGNOSIS — J90 Pleural effusion, not elsewhere classified: Secondary | ICD-10-CM | POA: Diagnosis not present

## 2023-05-18 DIAGNOSIS — I351 Nonrheumatic aortic (valve) insufficiency: Secondary | ICD-10-CM | POA: Diagnosis not present

## 2023-05-18 HISTORY — PX: RIGHT HEART CATH AND CORONARY ANGIOGRAPHY: CATH118264

## 2023-05-18 LAB — ECHOCARDIOGRAM COMPLETE
AR max vel: 1.27 cm2
AV Area VTI: 1.15 cm2
AV Area mean vel: 1.21 cm2
AV Mean grad: 50 mm[Hg]
AV Peak grad: 80 mm[Hg]
Ao pk vel: 4.47 m/s
Area-P 1/2: 5.13 cm2
Calc EF: 33.5 %
Height: 73 in
MV VTI: 2.96 cm2
S' Lateral: 4.7 cm
Single Plane A2C EF: 39.7 %
Single Plane A4C EF: 29.7 %
Weight: 3696.67 [oz_av]

## 2023-05-18 LAB — POCT I-STAT 7, (LYTES, BLD GAS, ICA,H+H)
Acid-Base Excess: 5 mmol/L — ABNORMAL HIGH (ref 0.0–2.0)
Bicarbonate: 31.6 mmol/L — ABNORMAL HIGH (ref 20.0–28.0)
Calcium, Ion: 1.17 mmol/L (ref 1.15–1.40)
HCT: 48 % (ref 39.0–52.0)
Hemoglobin: 16.3 g/dL (ref 13.0–17.0)
O2 Saturation: 84 %
Potassium: 3.9 mmol/L (ref 3.5–5.1)
Sodium: 141 mmol/L (ref 135–145)
TCO2: 33 mmol/L — ABNORMAL HIGH (ref 22–32)
pCO2 arterial: 52.2 mm[Hg] — ABNORMAL HIGH (ref 32–48)
pH, Arterial: 7.391 (ref 7.35–7.45)
pO2, Arterial: 50 mm[Hg] — ABNORMAL LOW (ref 83–108)

## 2023-05-18 LAB — POCT I-STAT EG7
Acid-Base Excess: 2 mmol/L (ref 0.0–2.0)
Bicarbonate: 30.6 mmol/L — ABNORMAL HIGH (ref 20.0–28.0)
Calcium, Ion: 1.11 mmol/L — ABNORMAL LOW (ref 1.15–1.40)
HCT: 48 % (ref 39.0–52.0)
Hemoglobin: 16.3 g/dL (ref 13.0–17.0)
O2 Saturation: 45 %
Potassium: 3.6 mmol/L (ref 3.5–5.1)
Sodium: 133 mmol/L — ABNORMAL LOW (ref 135–145)
TCO2: 32 mmol/L (ref 22–32)
pCO2, Ven: 60.8 mm[Hg] — ABNORMAL HIGH (ref 44–60)
pH, Ven: 7.309 (ref 7.25–7.43)
pO2, Ven: 28 mm[Hg] — CL (ref 32–45)

## 2023-05-18 SURGERY — RIGHT HEART CATH AND CORONARY ANGIOGRAPHY
Anesthesia: LOCAL

## 2023-05-18 MED ORDER — FUROSEMIDE 10 MG/ML IJ SOLN
40.0000 mg | Freq: Two times a day (BID) | INTRAMUSCULAR | Status: DC
  Filled 2023-05-18 (×2): qty 4

## 2023-05-18 MED ORDER — MIDAZOLAM HCL 2 MG/2ML IJ SOLN
INTRAMUSCULAR | Status: DC | PRN
  Administered 2023-05-18 (×2): 1 mg via INTRAVENOUS

## 2023-05-18 MED ORDER — HEPARIN SODIUM (PORCINE) 1000 UNIT/ML IJ SOLN
INTRAMUSCULAR | Status: DC | PRN
  Administered 2023-05-18: 5000 [IU] via INTRAVENOUS

## 2023-05-18 MED ORDER — LIDOCAINE HCL (PF) 1 % IJ SOLN
INTRAMUSCULAR | Status: DC | PRN
  Administered 2023-05-18: 5 mL

## 2023-05-18 MED ORDER — SODIUM CHLORIDE 0.9 % IV SOLN: 250.0000 mL | INTRAVENOUS | Status: AC | PRN

## 2023-05-18 MED ORDER — HEPARIN SODIUM (PORCINE) 1000 UNIT/ML IJ SOLN
INTRAMUSCULAR | Status: AC
  Filled 2023-05-18: qty 10

## 2023-05-18 MED ORDER — VERAPAMIL HCL 2.5 MG/ML IV SOLN
INTRAVENOUS | Status: DC | PRN
  Administered 2023-05-18: 10 mL via INTRA_ARTERIAL

## 2023-05-18 MED ORDER — FUROSEMIDE 10 MG/ML IJ SOLN
40.0000 mg | Freq: Two times a day (BID) | INTRAMUSCULAR | Status: DC
  Administered 2023-05-18 – 2023-05-21 (×6): 40 mg via INTRAVENOUS
  Filled 2023-05-18 (×5): qty 4

## 2023-05-18 MED ORDER — SODIUM CHLORIDE 0.9 % IV SOLN: INTRAVENOUS | Status: DC

## 2023-05-18 MED ORDER — ENOXAPARIN SODIUM 40 MG/0.4ML IJ SOSY
40.0000 mg | PREFILLED_SYRINGE | INTRAMUSCULAR | Status: DC
Start: 2023-05-19 — End: 2023-05-19
  Administered 2023-05-19: 40 mg via SUBCUTANEOUS
  Filled 2023-05-18: qty 0.4

## 2023-05-18 MED ORDER — FUROSEMIDE 40 MG PO TABS
40.0000 mg | ORAL_TABLET | Freq: Every day | ORAL | Status: DC
Start: 2023-05-19 — End: 2023-05-18

## 2023-05-18 MED ORDER — HEPARIN (PORCINE) IN NACL 2000-0.9 UNIT/L-% IV SOLN
INTRAVENOUS | Status: DC | PRN
  Administered 2023-05-18: 1000 mL

## 2023-05-18 MED ORDER — SODIUM CHLORIDE 0.9% FLUSH
3.0000 mL | Freq: Two times a day (BID) | INTRAVENOUS | Status: DC
  Administered 2023-05-18 – 2023-05-24 (×14): 3 mL via INTRAVENOUS

## 2023-05-18 MED ORDER — MIDAZOLAM HCL 2 MG/2ML IJ SOLN
INTRAMUSCULAR | Status: AC
  Filled 2023-05-18: qty 2

## 2023-05-18 MED ORDER — FENTANYL CITRATE (PF) 100 MCG/2ML IJ SOLN
INTRAMUSCULAR | Status: DC | PRN
  Administered 2023-05-18 (×2): 25 ug via INTRAVENOUS

## 2023-05-18 MED ORDER — FENTANYL CITRATE (PF) 100 MCG/2ML IJ SOLN
INTRAMUSCULAR | Status: AC
  Filled 2023-05-18: qty 2

## 2023-05-18 MED ORDER — MUPIROCIN CALCIUM 2 % EX CREA
TOPICAL_CREAM | Freq: Every day | CUTANEOUS | Status: DC
  Filled 2023-05-18: qty 15

## 2023-05-18 MED ORDER — IOHEXOL 350 MG/ML SOLN
INTRAVENOUS | Status: DC | PRN
  Administered 2023-05-18: 45 mL

## 2023-05-18 MED ORDER — SODIUM CHLORIDE 0.9% FLUSH: 3.0000 mL | INTRAVENOUS | Status: DC | PRN

## 2023-05-18 MED ORDER — HYDRALAZINE HCL 20 MG/ML IJ SOLN: 10.0000 mg | INTRAMUSCULAR | Status: AC | PRN

## 2023-05-18 MED ORDER — VERAPAMIL HCL 2.5 MG/ML IV SOLN
INTRAVENOUS | Status: AC
  Filled 2023-05-18: qty 2

## 2023-05-18 SURGICAL SUPPLY — 12 items
CATH 5FR JL3.5 JR4 ANG PIG MP (CATHETERS) IMPLANT
CATH BALLN WEDGE 5F 110CM (CATHETERS) IMPLANT
DEVICE RAD COMP TR BAND LRG (VASCULAR PRODUCTS) IMPLANT
ELECT DEFIB PAD ADLT CADENCE (PAD) IMPLANT
GLIDESHEATH SLEND SS 6F .021 (SHEATH) IMPLANT
GUIDEWIRE INQWIRE 1.5J.035X260 (WIRE) IMPLANT
INQWIRE 1.5J .035X260CM (WIRE) ×1
KIT SINGLE USE MANIFOLD (KITS) IMPLANT
PACK CARDIAC CATHETERIZATION (CUSTOM PROCEDURE TRAY) ×1 IMPLANT
SET ATX-X65L (MISCELLANEOUS) IMPLANT
SHEATH GLIDE SLENDER 4/5FR (SHEATH) IMPLANT
SHEATH PROBE COVER 6X72 (BAG) IMPLANT

## 2023-05-18 NOTE — Plan of Care (Signed)
  Problem: Education: Goal: Knowledge of General Education information will improve Description: Including pain rating scale, medication(s)/side effects and non-pharmacologic comfort measures Outcome: Progressing   Problem: Clinical Measurements: Goal: Ability to maintain clinical measurements within normal limits will improve Outcome: Progressing   Problem: Education: Goal: Understanding of CV disease, CV risk reduction, and recovery process will improve Outcome: Progressing   Problem: Cardiovascular: Goal: Vascular access site(s) Level 0-1 will be maintained Outcome: Progressing

## 2023-05-18 NOTE — Progress Notes (Signed)
Heart Failure Navigator Progress Note  Assessed for Heart & Vascular TOC clinic readiness.  Patient does not meet criteria due to EF 55%, No HF TOC per Dr. Jomarie Longs, has a scheduled [facility] appointment 05/21/23  Navigator will sign off at this time.   Rhae Hammock, BSN, Scientist, clinical (histocompatibility and immunogenetics) Only

## 2023-05-18 NOTE — Care Management Important Message (Signed)
Important Message  Patient Details  Name: Joshua [name] MRN: [id number] Date of Birth: [birth year]   Important Message Given:  Yes - Medicare IM     Renie Ora 05/18/2023, 9:50 AM

## 2023-05-18 NOTE — Interval H&P Note (Signed)
History and Physical Interval Note:  05/18/2023 10:58 AM  Joshua [name]  has presented today for surgery, with the diagnosis of aortic stenosis and HFpEF.  The various methods of treatment have been discussed with the patient and family. After consideration of risks, benefits and other options for treatment, the patient has consented to  Procedure(s): RIGHT/LEFT HEART CATH AND CORONARY ANGIOGRAPHY (N/A) as a surgical intervention.  The patient's history has been reviewed, patient examined, no change in status, stable for surgery.  I have reviewed the patient's chart and labs.  Questions were answered to the patient's satisfaction.    Cath Lab Visit (complete for each Cath Lab visit)  Clinical Evaluation Leading to the Procedure:   ACS: No.  Non-ACS:    Anginal/Heart Failure Classification: NYHA class IV  Anti-ischemic medical therapy: Maximal Therapy (2 or more classes of medications)  Non-Invasive Test Results: No non-invasive testing performed  Prior CABG: No previous CABG  Wolf Boulay

## 2023-05-18 NOTE — Progress Notes (Signed)
PROGRESS NOTE    Joshua [name]  ZOX:[id number] DOB: 1960-11-28 DOA: 05/15/2023 PCP: Marcine Matar, MD  62/M with history of CAD, prior PCI and stenting of LAD and RCA in 2018, recently diagnosed severe aortic stenosis, seen by structural heart team in November, however was asymptomatic from this standpoint and recommended follow-up.  Presented to the ED 1/31 with progressive shortness of breath, orthopnea and edema, symptoms started a week ago, significantly worse the day of admission.  In the ER he was hypoxic to 70%, placed on a nonrebreather and then BiPAP.  Workup noted BNP 2270, troponin 34, chest x-ray with pulmonary vascular congestion and small left pleural effusion.  Subjective: -Feels better overall, breathing improving, still on oxygen  Assessment and Plan:  Acute on chronic diastolic CHF Last echo 10/24 with EF 55%, grade 2 DD, normal RV, severe aortic stenosis -Likely driven by severe aortic stenosis, required BiPAP on admission -Improving with diuresis, 6 L negative, appears close to euvolemic, switch to oral Lasix, continue carvedilol, avoid excessive GDMT with severe AS -Wean O2, increase activity  Severe aortic stenosis -Seen by structural heart team Dr. Clifton James in 11/24, was asymptomatic at the time -Cards following, structural heart team to evaluate today -Plan for left heart cath  History of CAD -Prior PCI and stenting LAD and RCA in 2018 -Continue aspirin, carvedilol  Complete heart block Asymptomatic during sleep, single episode, carvedilol dose decreased  Hypertension -Decreased carvedilol, diuretics as above, discontinued amlodipine and lisinopril  Hyperglycemia, borderline diabetes -A1 C6.0,  Daily cannabis use -Counseled  DVT prophylaxis: Lovenox Code Status: Full code Family Communication: No family at bedside Disposition Plan: Home pending above workup  Consultants:    Procedures:   Antimicrobials:    Objective: Vitals:    05/18/23 1129 05/18/23 1134 05/18/23 1139 05/18/23 1144  BP: (!) 127/90 (!) 127/96 110/82 (!) 128/100  Pulse: 76 74 70 72  Resp: 19 (!) 26 (!) 22 (!) 21  Temp:      TempSrc:      SpO2: (!) 89% 90% 91% 91%  Weight:      Height:        Intake/Output Summary (Last 24 hours) at 05/18/2023 1200 Last data filed at 05/18/2023 8119 Gross per 24 hour  Intake 1074 ml  Output 2200 ml  Net -1126 ml   Filed Weights   05/16/23 2000 05/17/23 0415 05/18/23 0558  Weight: 106.4 kg 105.2 kg 104.8 kg    Examination:  General exam: AAOx3, no distress HEENT: No JVD CVS: S1-S2, systolic murmur Lungs: Decreased sounds at the bases Abdomen: Soft, nontender, bowel sounds present Extremities: Trace edema Skin: No rashes Psychiatry:  Mood & affect appropriate.     Data Reviewed:   CBC: Recent Labs  Lab 05/15/23 1604 05/16/23 0455 05/17/23 0250  WBC 8.7 7.7 8.7  NEUTROABS 7.4  --   --   HGB 16.7 15.4 15.8  HCT 51.3 47.3 48.8  MCV 92.6 92.4 94.2  PLT 169 157 172   Basic Metabolic Panel: Recent Labs  Lab 05/15/23 1604 05/15/23 1816 05/16/23 0455 05/17/23 0250 05/17/23 2009  NA 137  --  142 141 141  K 3.9  --  4.0 3.6 4.2  CL 101  --  105 104 101  CO2 26  --  27 28 29   GLUCOSE 165*  --  121* 165* 128*  BUN 12  --  11 13 15   CREATININE 0.78  --  0.93 1.00 1.29*  CALCIUM 8.8*  --  8.8* 8.5* 8.6*  MG  --  2.2  --   --  2.1   GFR: Estimated Creatinine Clearance: 73.6 mL/min (A) (by C-G formula based on SCr of 1.29 mg/dL (H)). Liver Function Tests: Recent Labs  Lab 05/16/23 0455 05/17/23 0250  AST 28 23  ALT 60* 47*  ALKPHOS 95 93  BILITOT 1.2 0.8  PROT 5.9* 6.0*  ALBUMIN 3.2* 3.2*   No results for input(s): "LIPASE", "AMYLASE" in the last 168 hours. No results for input(s): "AMMONIA" in the last 168 hours. Coagulation Profile: No results for input(s): "INR", "PROTIME" in the last 168 hours. Cardiac Enzymes: No results for input(s): "CKTOTAL", "CKMB", "CKMBINDEX",  "TROPONINI" in the last 168 hours. BNP (last 3 results) No results for input(s): "PROBNP" in the last 8760 hours. HbA1C: Recent Labs    05/17/23 0250  HGBA1C 6.0*   CBG: No results for input(s): "GLUCAP" in the last 168 hours. Lipid Profile: No results for input(s): "CHOL", "HDL", "LDLCALC", "TRIG", "CHOLHDL", "LDLDIRECT" in the last 72 hours. Thyroid Function Tests: No results for input(s): "TSH", "T4TOTAL", "FREET4", "T3FREE", "THYROIDAB" in the last 72 hours. Anemia Panel: No results for input(s): "VITAMINB12", "FOLATE", "FERRITIN", "TIBC", "IRON", "RETICCTPCT" in the last 72 hours. Urine analysis:    Component Value Date/Time   COLORURINE COLORLESS (A) 11/22/2018 1900   APPEARANCEUR CLEAR 11/22/2018 1900   LABSPEC 1.002 (L) 11/22/2018 1900   PHURINE 6.0 11/22/2018 1900   GLUCOSEU NEGATIVE 11/22/2018 1900   HGBUR NEGATIVE 11/22/2018 1900   BILIRUBINUR NEGATIVE 11/22/2018 1900   KETONESUR NEGATIVE 11/22/2018 1900   PROTEINUR NEGATIVE 11/22/2018 1900   NITRITE NEGATIVE 11/22/2018 1900   LEUKOCYTESUR NEGATIVE 11/22/2018 1900   Sepsis Labs: @LABRCNTIP (procalcitonin:4,lacticidven:4)  ) Recent Results (from the past 240 hours)  Resp panel by RT-PCR (RSV, Flu A&B, Covid) Anterior Nasal Swab     Status: None   Collection Time: 05/15/23  2:43 PM   Specimen: Anterior Nasal Swab  Result Value Ref Range Status   SARS Coronavirus 2 by RT PCR NEGATIVE NEGATIVE Final   Influenza A by PCR NEGATIVE NEGATIVE Final   Influenza B by PCR NEGATIVE NEGATIVE Final    Comment: (NOTE) The Xpert Xpress SARS-CoV-2/FLU/RSV plus assay is intended as an aid in the diagnosis of influenza from Nasopharyngeal swab specimens and should not be used as a sole basis for treatment. Nasal washings and aspirates are unacceptable for Xpert Xpress SARS-CoV-2/FLU/RSV testing.  Fact Sheet for Patients: BloggerCourse.com  Fact Sheet for Healthcare  Providers: SeriousBroker.it  This test is not yet approved or cleared by the [place] FDA and has been authorized for detection and/or diagnosis of SARS-CoV-2 by FDA under an Emergency Use Authorization (EUA). This EUA will remain in effect (meaning this test can be used) for the duration of the COVID-19 declaration under Section 564(b)(1) of the Act, 21 U.S.C. section 360bbb-3(b)(1), unless the authorization is terminated or revoked.     Resp Syncytial Virus by PCR NEGATIVE NEGATIVE Final    Comment: (NOTE) Fact Sheet for Patients: BloggerCourse.com  Fact Sheet for Healthcare Providers: SeriousBroker.it  This test is not yet approved or cleared by the [place] FDA and has been authorized for detection and/or diagnosis of SARS-CoV-2 by FDA under an Emergency Use Authorization (EUA). This EUA will remain in effect (meaning this test can be used) for the duration of the COVID-19 declaration under Section 564(b)(1) of the Act, 21 U.S.C. section 360bbb-3(b)(1), unless the authorization is terminated or revoked.  Performed at [facility]  Lab, [address], [place], [place] [address]      Radiology Studies: No results found.    Scheduled Meds:  [MAR Hold] aspirin EC  81 mg Oral Daily   [MAR Hold] carvedilol  3.125 mg Oral BID   [MAR Hold] enoxaparin (LOVENOX) injection  40 mg Subcutaneous Q24H   [MAR Hold] furosemide  40 mg Oral Daily   [MAR Hold] mupirocin cream   Topical Daily   [MAR Hold] sodium chloride flush  3 mL Intravenous Q12H   Continuous Infusions:  sodium chloride       LOS: 3 days    Time spent:    Zannie Cove, MD [facility]   05/18/2023, 12:00 PM

## 2023-05-18 NOTE — Progress Notes (Signed)
Pt from home with c/o shortness of breath. Pt had cath done today and is on Oxygen at 2 L [place]. Watch for home O2 need.

## 2023-05-18 NOTE — Progress Notes (Signed)
  Echocardiogram 2D Echocardiogram has been performed.  Joshua [name] 05/18/2023, 3:11 PM

## 2023-05-18 NOTE — H&P (View-Only) (Signed)
Patient Name: Joshua [name] Date of Encounter: 05/18/2023 [facility] Cardiologist: Thurmon Fair, MD   Interval Summary  .    Feeling well.  Notes that he does not chronically use supplemental oxygen.  Notes that Dr. Clifton James was rate and that he is in agreement that he now needs his valve replaced.  Vital Signs .    Vitals:   05/18/23 0002 05/18/23 0431 05/18/23 0558 05/18/23 0753  BP: (!) 128/93 (!) 117/95  126/88  Pulse: 65 70  70  Resp: 20 (!) 21  18  Temp: 98 F (36.7 C) 98 F (36.7 C)  97.8 F (36.6 C)  TempSrc: Oral Oral  Oral  SpO2: 96% 93%  98%  Weight:   104.8 kg   Height:        Intake/Output Summary (Last 24 hours) at 05/18/2023 0837 Last data filed at 05/18/2023 0729 Gross per 24 hour  Intake 1194 ml  Output 2200 ml  Net -1006 ml      05/18/2023    5:58 AM 05/17/2023    4:15 AM 05/16/2023    8:00 PM  Last 3 Weights  Weight (lbs) 231 lb 0.7 oz 231 lb 14.8 oz 234 lb 9.1 oz  Weight (kg) 104.8 kg 105.2 kg 106.4 kg      Telemetry/ECG    Sinus rhythm.  PVCs.  NSVT.  Complete heart block approximately 9 PM 05/17/2023- Personally Reviewed  Echo 01/2023:  1. Left ventricular ejection fraction, by estimation, is 55%. The left  ventricle has normal function. The left ventricle has no regional wall  motion abnormalities. There is mild concentric left ventricular  hypertrophy. Left ventricular diastolic  parameters are consistent with Grade II diastolic dysfunction  (pseudonormalization).   2. Right ventricular systolic function is normal. The right ventricular  size is mildly enlarged. Tricuspid regurgitation signal is inadequate for  assessing PA pressure.   3. Left atrial size was mildly dilated.   4. The mitral valve is normal in structure. Trivial mitral valve  regurgitation. No evidence of mitral stenosis.   5. The aortic valve is bicuspid. There is moderate calcification of the  aortic valve. Aortic valve regurgitation is mild. Severe aortic  valve  stenosis. Aortic valve area, by VTI measures 0.75 cm. Aortic valve mean  gradient measures 53.0 mmHg.   6. Aortic dilatation noted. There is mild dilatation of the ascending  aorta, measuring 38 mm.   7. The inferior vena cava is normal in size with greater than 50%  respiratory variability, suggesting right atrial pressure of 3 mmHg.   Physical Exam .    VS:  BP 126/88 (BP Location: Left Arm)   Pulse 70   Temp 97.8 F (36.6 C) (Oral)   Resp 18   Ht 6\' 1"  (1.854 m)   Wt 104.8 kg   SpO2 98%   BMI 30.48 kg/m  , BMI Body mass index is 30.48 kg/m. GENERAL:  Well appearing.  No acute distress HEENT: Pupils equal round and reactive, fundi not visualized, oral mucosa unremarkable NECK:  No jugular venous distention, waveform within normal limits, carotid upstroke brisk and symmetric, no bruits, no thyromegaly LUNGS:  Clear to auscultation bilaterally HEART:  RRR.  PMI not displaced or sustained,S1 and S2 within normal limits, no S3, no S4, no clicks, no rubs, late-peaking III/VI systolic murmur at the left upper sternal border. ABD:  Flat, positive bowel sounds normal in frequency in pitch, no bruits, no rebound, no guarding, no midline pulsatile  mass, no hepatomegaly, no splenomegaly EXT:  2 plus pulses throughout, no edema, no cyanosis no clubbing SKIN:  No rashes no nodules NEURO:  Cranial nerves II through XII grossly intact, motor grossly intact throughout PSYCH:  Cognitively intact, oriented to person place and time   Assessment & Plan .     56M with severe aortic stenosis, hypertension, and CAD status post PCI admitted with acute on chronic diastolic heart failure.  # Acute on chronic diastolic heart failure: History of systolic heart failure in 2018.  He had severe two-vessel disease and underwent PCI of the LAD and RCA at that time.  Systolic function recovered.  Now admitted with heart failure exacerbation.  LVEF was 55% 01/2023.  Has known severe aortic stenosis.   Creatinine is increased from 1-1.29.  Lasix has been switched to oral.  He appears to be euvolemic on exam.  Will get right heart catheterization today and preparation for his evaluation for valve replacement.  # Severe aortic stenosis: Echo 01/2023 revealed a bicuspid aortic valve with a mean gradient of 53 mmHg.  He last saw Dr. Clifton James 02/2023.  At that time he was asymptomatic.  Given that he is now being admitted with heart failure exacerbation needs the valve replaced.  Will alert the structural heart team that he is here.  Repeat echocardiogram pending.  # CAD: Status post LAD and RCA PCI as above.  Continue aspirin and carvedilol.  He has a statin allergy.  Lipids are well-controlled on Repatha.  Will get left heart catheterization in preparation for evaluation for valve surgery.  Informed Consent   Shared Decision Making/Informed Consent The risks [stroke (1 in 1000), death (1 in 1000), kidney failure [usually temporary] (1 in 500), bleeding (1 in 200), allergic reaction [possibly serious] (1 in 200)], benefits (diagnostic support and management of coronary artery disease) and alternatives of a cardiac catheterization (LHC/RHC) were discussed in detail with [name] and he is willing to proceed.     # Hypertension:  Blood pressure controlled on carvedilol.  Home lisinopril is on hold.  Will reduce carvedilol due to episode of complete heart block while sleeping overnight.  # Complete heart block: Patient was asymptomatic and had just fallen asleep.  However he did have an episode of complete heart block.  Will reduce carvedilol to 3.125 mg as above.  Continue to monitor on telemetry.  This does raise his risk of needing pacemaker after a TAVR or SAVR.  For questions or updates, please contact [facility] Please consult [email or web address] for contact info under        Signed, Chilton Si, MD

## 2023-05-18 NOTE — Consult Note (Signed)
WOC Nurse Consult Note: Reason for Consult: Consult requested for right leg, head and right ear.  Pt has 3 areas of partial thickness skin loss from various injuries prior to hospital stay.  Top of head with yellow moist partial thickness abrasion, .5X.5X.1cm, small amt yellow drainage Left posterior ear crease with partial thickness abrasion, .5X.5X.1cm, 50% yellow, 50% red, small amt yellow drainage Right anterior calf with partial; thickness abrasion; .8X.8X.1cm, yellow and moist, small amt yellow drainage.  Dressing procedure/placement/frequency: Topical treatment orders provided for bedside nurses to perform as follows: 1. Apply Bactroban to left posterior ear and head wound Q day, may leave open to air 2. Foam dressing to right leg wound, change Q 3 days or PRN soiling. Please re-consult if further assistance is needed.  Thank-you,  Cammie Mcgee MSN, RN, CWOCN, [place], CNS [phone]

## 2023-05-18 NOTE — Progress Notes (Addendum)
Patient Name: Joshua [name] Date of Encounter: 05/18/2023 [facility] Cardiologist: Joshua Fair, MD   Interval Summary  .    Feeling well.  Notes that he does not chronically use supplemental oxygen.  Notes that Dr. Clifton [name] was rate and that he is in agreement that he now needs his valve replaced.  Vital Signs .    Vitals:   05/18/23 0002 05/18/23 0431 05/18/23 0558 05/18/23 0753  BP: (!) 128/93 (!) 117/95  126/88  Pulse: 65 70  70  Resp: 20 (!) 21  18  Temp: 98 F (36.7 C) 98 F (36.7 C)  97.8 F (36.6 C)  TempSrc: Oral Oral  Oral  SpO2: 96% 93%  98%  Weight:   104.8 kg   Height:        Intake/Output Summary (Last 24 hours) at 05/18/2023 0837 Last data filed at 05/18/2023 0729 Gross per 24 hour  Intake 1194 ml  Output 2200 ml  Net -1006 ml      05/18/2023    5:58 AM 05/17/2023    4:15 AM 05/16/2023    8:00 PM  Last 3 Weights  Weight (lbs) 231 lb 0.7 oz 231 lb 14.8 oz 234 lb 9.1 oz  Weight (kg) 104.8 kg 105.2 kg 106.4 kg      Telemetry/ECG    Sinus rhythm.  PVCs.  NSVT.  Complete heart block approximately 9 PM 05/17/2023- Personally Reviewed  Echo 01/2023:  1. Left ventricular ejection fraction, by estimation, is 55%. The left  ventricle has normal function. The left ventricle has no regional wall  motion abnormalities. There is mild concentric left ventricular  hypertrophy. Left ventricular diastolic  parameters are consistent with Grade II diastolic dysfunction  (pseudonormalization).   2. Right ventricular systolic function is normal. The right ventricular  size is mildly enlarged. Tricuspid regurgitation signal is inadequate for  assessing PA pressure.   3. Left atrial size was mildly dilated.   4. The mitral valve is normal in structure. Trivial mitral valve  regurgitation. No evidence of mitral stenosis.   5. The aortic valve is bicuspid. There is moderate calcification of the  aortic valve. Aortic valve regurgitation is mild. Severe aortic  valve  stenosis. Aortic valve area, by VTI measures 0.75 cm. Aortic valve mean  gradient measures 53.0 mmHg.   6. Aortic dilatation noted. There is mild dilatation of the ascending  aorta, measuring 38 mm.   7. The inferior vena cava is normal in size with greater than 50%  respiratory variability, suggesting right atrial pressure of 3 mmHg.   Physical Exam .    VS:  BP 126/88 (BP Location: Left Arm)   Pulse 70   Temp 97.8 F (36.6 C) (Oral)   Resp 18   Ht 6\' 1"  (1.854 m)   Wt 104.8 kg   SpO2 98%   BMI 30.48 kg/m  , BMI Body mass index is 30.48 kg/m. GENERAL:  Well appearing.  No acute distress HEENT: Pupils equal round and reactive, fundi not visualized, oral mucosa unremarkable NECK:  No jugular venous distention, waveform within normal limits, carotid upstroke brisk and symmetric, no bruits, no thyromegaly LUNGS:  Clear to auscultation bilaterally HEART:  RRR.  PMI not displaced or sustained,S1 and S2 within normal limits, no S3, no S4, no clicks, no rubs, late-peaking III/VI systolic murmur at the left upper sternal border. ABD:  Flat, positive bowel sounds normal in frequency in pitch, no bruits, no rebound, no guarding, no midline pulsatile  mass, no hepatomegaly, no splenomegaly EXT:  2 plus pulses throughout, no edema, no cyanosis no clubbing SKIN:  No rashes no nodules NEURO:  Cranial nerves II through XII grossly intact, motor grossly intact throughout PSYCH:  Cognitively intact, oriented to person place and time   Assessment & Plan .     56M with severe aortic stenosis, hypertension, and CAD status post PCI admitted with acute on chronic diastolic heart failure.  # Acute on chronic diastolic heart failure: History of systolic heart failure in 2018.  He had severe two-vessel disease and underwent PCI of the LAD and RCA at that time.  Systolic function recovered.  Now admitted with heart failure exacerbation.  LVEF was 55% 01/2023.  Has known severe aortic stenosis.   Creatinine is increased from 1-1.29.  Lasix has been switched to oral.  He appears to be euvolemic on exam.  Will get right heart catheterization today and preparation for his evaluation for valve replacement.  # Severe aortic stenosis: Echo 01/2023 revealed a bicuspid aortic valve with a mean gradient of 53 mmHg.  He last saw Dr. Clifton [name] 02/2023.  At that time he was asymptomatic.  Given that he is now being admitted with heart failure exacerbation needs the valve replaced.  Will alert the structural heart team that he is here.  Repeat echocardiogram pending.  # CAD: Status post LAD and RCA PCI as above.  Continue aspirin and carvedilol.  He has a statin allergy.  Lipids are well-controlled on Repatha.  Will get left heart catheterization in preparation for evaluation for valve surgery.  Informed Consent   Shared Decision Making/Informed Consent The risks [stroke (1 in 1000), death (1 in 1000), kidney failure [usually temporary] (1 in 500), bleeding (1 in 200), allergic reaction [possibly serious] (1 in 200)], benefits (diagnostic support and management of coronary artery disease) and alternatives of a cardiac catheterization (LHC/RHC) were discussed in detail with Joshua [name] and he is willing to proceed.     # Hypertension:  Blood pressure controlled on carvedilol.  Home lisinopril is on hold.  Will reduce carvedilol due to episode of complete heart block while sleeping overnight.  # Complete heart block: Patient was asymptomatic and had just fallen asleep.  However he did have an episode of complete heart block.  Will reduce carvedilol to 3.125 mg as above.  Continue to monitor on telemetry.  This does raise his risk of needing pacemaker after a TAVR or SAVR.  For questions or updates, please contact [facility] Please consult [email or web address] for contact info under        Signed, Joshua Si, MD

## 2023-05-18 NOTE — Progress Notes (Signed)
TR band removed, no sign of bleeding, no hematoma. Sterile transparent dressing applied, will continue to monitor.

## 2023-05-19 ENCOUNTER — Encounter (HOSPITAL_COMMUNITY): Payer: Self-pay | Admitting: Internal Medicine

## 2023-05-19 ENCOUNTER — Inpatient Hospital Stay (HOSPITAL_COMMUNITY): Payer: Medicaid Other

## 2023-05-19 ENCOUNTER — Other Ambulatory Visit: Payer: Self-pay | Admitting: Physician Assistant

## 2023-05-19 DIAGNOSIS — I350 Nonrheumatic aortic (valve) stenosis: Secondary | ICD-10-CM

## 2023-05-19 DIAGNOSIS — I509 Heart failure, unspecified: Secondary | ICD-10-CM

## 2023-05-19 DIAGNOSIS — I10 Essential (primary) hypertension: Secondary | ICD-10-CM | POA: Diagnosis not present

## 2023-05-19 DIAGNOSIS — J90 Pleural effusion, not elsewhere classified: Secondary | ICD-10-CM | POA: Diagnosis not present

## 2023-05-19 DIAGNOSIS — I5033 Acute on chronic diastolic (congestive) heart failure: Secondary | ICD-10-CM | POA: Diagnosis not present

## 2023-05-19 HISTORY — PX: IR THORACENTESIS ASP PLEURAL SPACE W/IMG GUIDE: IMG5380

## 2023-05-19 LAB — BASIC METABOLIC PANEL
Anion gap: 12 (ref 5–15)
BUN: 11 mg/dL (ref 8–23)
CO2: 29 mmol/L (ref 22–32)
Calcium: 8.9 mg/dL (ref 8.9–10.3)
Chloride: 101 mmol/L (ref 98–111)
Creatinine, Ser: 0.86 mg/dL (ref 0.61–1.24)
GFR, Estimated: >60 mL/min (ref >60)
Glucose, Bld: 124 mg/dL — ABNORMAL HIGH (ref 70–99)
Potassium: 4 mmol/L (ref 3.5–5.1)
Sodium: 142 mmol/L (ref 135–145)

## 2023-05-19 LAB — BODY FLUID CELL COUNT WITH DIFFERENTIAL
Eos, Fluid: 0 %
Lymphs, Fluid: 62 %
Monocyte-Macrophage-Serous Fluid: 28 % — ABNORMAL LOW (ref 50–90)
Neutrophil Count, Fluid: 10 % (ref 0–25)
Total Nucleated Cell Count, Fluid: 555 uL (ref 0–1000)

## 2023-05-19 LAB — LACTATE DEHYDROGENASE: LDH: 287 U/L — ABNORMAL HIGH (ref 98–192)

## 2023-05-19 LAB — PROTEIN, TOTAL: Total Protein: 6.3 g/dL — ABNORMAL LOW (ref 6.5–8.1)

## 2023-05-19 LAB — PROTEIN, PLEURAL OR PERITONEAL FLUID: Total protein, fluid: <3 g/dL

## 2023-05-19 MED ORDER — LIDOCAINE HCL 1 % IJ SOLN
INTRAMUSCULAR | Status: AC
  Filled 2023-05-19: qty 20

## 2023-05-19 MED ORDER — IOHEXOL 350 MG/ML SOLN
95.0000 mL | Freq: Once | INTRAVENOUS | Status: AC | PRN
  Administered 2023-05-19: 95 mL via INTRAVENOUS

## 2023-05-19 MED ORDER — LIDOCAINE HCL (PF) 1 % IJ SOLN: 10.0000 mL | Freq: Once | INTRAMUSCULAR | Status: DC

## 2023-05-19 NOTE — Procedures (Signed)
 PROCEDURE SUMMARY:  Successful US  guided right thoracentesis. Yielded 1.3 L of redish fluid. Patient tolerated procedure well. No immediate complications. EBL = trace  Specimen was sent for labs.  Post procedure chest X-ray reveals no pneumothorax  Domingo Fuson S Jayceion Lisenby PA-C 05/19/2023 2:01 PM

## 2023-05-19 NOTE — Progress Notes (Signed)
   05/19/23 0016  BiPAP/CPAP/SIPAP  Reason BIPAP/CPAP not in use Non-compliant (Refused)

## 2023-05-19 NOTE — Progress Notes (Addendum)
 Patient Name: Joshua [name] Date of Encounter: 05/19/2023 [facility] Cardiologist: Jerel Balding, MD   Interval Summary  .    Feeling well.  Breathing improving.  Denies CP.   Vital Signs .    Vitals:   05/18/23 2300 05/18/23 2314 05/19/23 0406 05/19/23 0754  BP:  126/85 118/86 (!) 120/91  Pulse:  72  84  Resp: 18 18 (!) 25 20  Temp:  97.7 F (36.5 C) 98 F (36.7 C) 98 F (36.7 C)  TempSrc:  Oral Oral Oral  SpO2:  97% 96% 96%  Weight:      Height:        Intake/Output Summary (Last 24 hours) at 05/19/2023 0949 Last data filed at 05/19/2023 0935 Gross per 24 hour  Intake 1360 ml  Output 5350 ml  Net -3990 ml      05/18/2023    5:58 AM 05/17/2023    4:15 AM 05/16/2023    8:00 PM  Last 3 Weights  Weight (lbs) 231 lb 0.7 oz 231 lb 14.8 oz 234 lb 9.1 oz  Weight (kg) 104.8 kg 105.2 kg 106.4 kg      Telemetry/ECG    Sinus rhythm.  PVCs.  - Personally Reviewed  Echo 01/2023:  1. Left ventricular ejection fraction, by estimation, is 55%. The left  ventricle has normal function. The left ventricle has no regional wall  motion abnormalities. There is mild concentric left ventricular  hypertrophy. Left ventricular diastolic  parameters are consistent with Grade II diastolic dysfunction  (pseudonormalization).   2. Right ventricular systolic function is normal. The right ventricular  size is mildly enlarged. Tricuspid regurgitation signal is inadequate for  assessing PA pressure.   3. Left atrial size was mildly dilated.   4. The mitral valve is normal in structure. Trivial mitral valve  regurgitation. No evidence of mitral stenosis.   5. The aortic valve is bicuspid. There is moderate calcification of the  aortic valve. Aortic valve regurgitation is mild. Severe aortic valve  stenosis. Aortic valve area, by VTI measures 0.75 cm. Aortic valve mean  gradient measures 53.0 mmHg.   6. Aortic dilatation noted. There is mild dilatation of the ascending   aorta, measuring 38 mm.   7. The inferior vena cava is normal in size with greater than 50%  respiratory variability, suggesting right atrial pressure of 3 mmHg.   Echo 05/18/2023: 1. Left ventricular ejection fraction, by estimation, is 30 to 35%. The  left ventricle has moderately decreased function. The left ventricle  demonstrates global hypokinesis. There is mild concentric left ventricular  hypertrophy. Indeterminate diastolic  filling due to E-A fusion. Elevated left ventricular end-diastolic  pressure.   2. Right ventricular systolic function is low normal. The right  ventricular size is normal.   3. Left atrial size was moderately dilated.   4. Right atrial size was mildly dilated.   5. Moderate pleural effusion in both left and right lateral regions.   6. The mitral valve is normal in structure. Trivial mitral valve  regurgitation. No evidence of mitral stenosis.   7. The aortic valve is bicuspid. There is severe calcifcation of the  aortic valve. There is moderate thickening of the aortic valve. Aortic  valve regurgitation is mild. Severe aortic valve stenosis. Aortic valve  mean gradient measures 50.0 mmHg. Aortic  valve Vmax measures 4.47 m/s.   8. The inferior vena cava is dilated in size with <50% respiratory  variability, suggesting right atrial pressure of 15  mmHg.   LHC/RHC 05/18/23: Conclusions: Mild, non-obstructive coronary artery disease with 20% proximal RCA stenosis and mild luminal irregularities in the LAD and LCx. Patent mid LAD and proximal RCA stents with mild (~10%) in-stent restenosis. Severe elevated left heart, right heart, and pulmonary artery pressures (see details below). Severely reduced Fick cardiac output/index.   Recommendations: Escalate diuresis; will transition back to furosemide  40 mg IV BID. Hold carvedilol  in the setting of severely reduced cardiac output and decompensated heart failure. Proceed with workup for aortic valve intervention  per structural heart team. Continue secondary prevention of coronary artery disease.    Physical Exam .    VS:  BP (!) 120/91 (BP Location: Left Arm)   Pulse 84   Temp 98 F (36.7 C) (Oral)   Resp 20   Ht 6' 1 (1.854 m)   Wt 104.8 kg   SpO2 96%   BMI 30.48 kg/m  , BMI Body mass index is 30.48 kg/m. GENERAL:  Well appearing.  No acute distress HEENT: Pupils equal round and reactive, fundi not visualized, oral mucosa unremarkable NECK:  No jugular venous distention, waveform within normal limits, carotid upstroke brisk and symmetric, no bruits, no thyromegaly LUNGS:  Clear to auscultation bilaterally HEART:  RRR.  PMI not displaced or sustained,S1 within normal limits.  Absent S2.  No S3, no S4, no clicks, no rubs, late-peaking III/VI systolic murmur at the left upper sternal border. ABD:  Flat, positive bowel sounds normal in frequency in pitch, no bruits, no rebound, no guarding, no midline pulsatile mass, no hepatomegaly, no splenomegaly EXT:  2 plus pulses throughout, no edema, no cyanosis no clubbing SKIN:  No rashes no nodules NEURO:  Cranial nerves II through XII grossly intact, motor grossly intact throughout PSYCH:  Cognitively intact, oriented to person place and time   Assessment & Plan .     49M with severe aortic stenosis, hypertension, and CAD status post PCI admitted with acute on chronic diastolic heart failure.  # Acute systolic and diastolic heart failure: History of systolic heart failure in 2018.  He had severe two-vessel disease and underwent PCI of the LAD and RCA at that time.  Systolic function recovered.  Now admitted with heart failure exacerbation.  LVEF was 55% 01/2023.  This admission LVEF is again reduced to 30-35% with global hypokinesis.  Has known critical aortic stenosis.  Mean gradient 50 mmHg.  Creatinine is increased from 1-1.29.  Lasix  was resumed at 40 mg IV twice daily.  He was net -2.7 L yesterday.  -1.5 L so far today and -10 L this  admission.  Renal function improving with diuresis.  There is concern for pleural effusion.  Chest x-ray pending for possible thoracentesis.  His home amlodipine  was discontinued.  Given his critical aortic stenosis will not titrate GDMT due to concern for inducing hypotension.  Will need to escalate GDMT after his valve is replaced.  # Severe aortic stenosis: Echo 01/2023 revealed a bicuspid aortic valve with a mean gradient of 53 mmHg.  50 mmHg this admission and systolic function is now reduced.  He last saw Dr. Verlin 02/2023.  At that time he was asymptomatic.  Given that he is now being admitted with heart failure exacerbation needs the valve replaced.  Structural heart team will see him today.  # CAD: Status post LAD and RCA PCI as above.  Cath revealed mild nonobstructive disease yesterday.  Carvedilol  was discontinued given his acute heart failure saturation.  He has a statin  allergy.  Lipids are well-controlled on Repatha .  Will get left heart catheterization in preparation for evaluation for valve surgery.  # Hypertension:  Home antihypertensives on hold as above.  # Complete heart block: Patient was asymptomatic and had just fallen asleep.  However he did have an episode of complete heart block.  Carvedilol  on hold.  Continue to monitor on telemetry.  This does raise his risk of needing pacemaker after a TAVR or SAVR.  For questions or updates, please contact [facility] Please consult [email or web address] for contact info under        Signed, Annabella Scarce, MD

## 2023-05-19 NOTE — Progress Notes (Signed)
 PROGRESS NOTE    Joshua [name]  FMW:[id number] DOB: [birth year] DOA: 05/15/2023 PCP: Vicci Barnie NOVAK, MD  62/M with history of CAD, prior PCI and stenting of LAD and RCA in 2018, recently diagnosed severe aortic stenosis, seen by structural heart team in November, however was asymptomatic from this standpoint and recommended follow-up.  Presented to the ED 1/31 with progressive shortness of breath, orthopnea and edema, symptoms started a week ago, significantly worse the day of admission.  In the ER he was hypoxic to 70%, placed on a nonrebreather and then BiPAP.  Workup noted BNP 2270, troponin 34, chest x-ray with pulmonary vascular congestion and small left pleural effusion. -Right and left heart cath with minimal nonobstructive CAD, elevated filling pressures, depressed cardiac output/index  Subjective: -Feels better overall, breathing improving, still on oxygen  Assessment and Plan:  Acute systolic and diastolic CHF Bilateral moderate pleural effusions Last echo 10/24 with EF 55%, grade 2 DD, normal RV, severe aortic stenosis -Likely driven by severe aortic stenosis, required BiPAP on admission -Repeat echo with EF down to 30-35%, global hypokinesis, known critical aortic stenosis -Improving with diuresis, 8.8 L negative, right heart cath with elevated filling pressures continue carvedilol , avoid excessive GDMT with severe AS -Right thoracentesis today, has moderate pleural effusions -Wean O2, increase activity  Severe aortic stenosis -Seen by structural heart team Dr. Verlin in 11/24, was asymptomatic at the time -structural heart team now following -Cath with mild nonobstructive CAD  History of CAD -Prior PCI and stenting LAD and RCA in 2018 -Continue aspirin , carvedilol   Complete heart block Asymptomatic during sleep, single episode, carvedilol  dose decreased  Hypertension -Decreased carvedilol , diuretics as above, discontinued amlodipine  and  lisinopril   Hyperglycemia, borderline diabetes -A1 C6.0,  Daily cannabis use -Counseled  DVT prophylaxis: Lovenox -hold Lovenox  for thoracentesis Code Status: Full code Family Communication: No family at bedside Disposition Plan: Home pending above workup  Consultants:    Procedures:   Antimicrobials:    Objective: Vitals:   05/18/23 2300 05/18/23 2314 05/19/23 0406 05/19/23 0754  BP:  126/85 118/86 (!) 120/91  Pulse:  72  84  Resp: 18 18 (!) 25 20  Temp:  97.7 F (36.5 C) 98 F (36.7 C) 98 F (36.7 C)  TempSrc:  Oral Oral Oral  SpO2:  97% 96% 96%  Weight:      Height:        Intake/Output Summary (Last 24 hours) at 05/19/2023 1008 Last data filed at 05/19/2023 0935 Gross per 24 hour  Intake 1360 ml  Output 5350 ml  Net -3990 ml   Filed Weights   05/16/23 2000 05/17/23 0415 05/18/23 0558  Weight: 106.4 kg 105.2 kg 104.8 kg    Examination:  General exam: AAOx3, no distress HEENT: + JVD CVS: S1-S2, systolic murmur Lungs: Decreased sounds at the bases Abdomen: Soft, nontender, bowel sounds present Extremities: Trace edema Skin: No rashes Psychiatry:  Mood & affect appropriate.     Data Reviewed:   CBC: Recent Labs  Lab 05/15/23 1604 05/16/23 0455 05/17/23 0250 05/18/23 1126 05/18/23 1128  WBC 8.7 7.7 8.7  --   --   NEUTROABS 7.4  --   --   --   --   HGB 16.7 15.4 15.8 16.3 16.3  HCT 51.3 47.3 48.8 48.0 48.0  MCV 92.6 92.4 94.2  --   --   PLT 169 157 172  --   --    Basic Metabolic Panel: Recent Labs  Lab 05/15/23 1604 05/15/23 1816  05/16/23 0455 05/17/23 0250 05/17/23 2009 05/18/23 1126 05/18/23 1128 05/19/23 0246  NA 137  --  142 141 141 133* 141 142  K 3.9  --  4.0 3.6 4.2 3.6 3.9 4.0  CL 101  --  105 104 101  --   --  101  CO2 26  --  27 28 29   --   --  29  GLUCOSE 165*  --  121* 165* 128*  --   --  124*  BUN 12  --  11 13 15   --   --  11  CREATININE 0.78  --  0.93 1.00 1.29*  --   --  0.86  CALCIUM  8.8*  --  8.8* 8.5* 8.6*   --   --  8.9  MG  --  2.2  --   --  2.1  --   --   --    GFR: Estimated Creatinine Clearance: 110.3 mL/min (by C-G formula based on SCr of 0.86 mg/dL). Liver Function Tests: Recent Labs  Lab 05/16/23 0455 05/17/23 0250  AST 28 23  ALT 60* 47*  ALKPHOS 95 93  BILITOT 1.2 0.8  PROT 5.9* 6.0*  ALBUMIN  3.2* 3.2*   No results for input(s): LIPASE, AMYLASE in the last 168 hours. No results for input(s): AMMONIA in the last 168 hours. Coagulation Profile: No results for input(s): INR, PROTIME in the last 168 hours. Cardiac Enzymes: No results for input(s): CKTOTAL, CKMB, CKMBINDEX, TROPONINI in the last 168 hours. BNP (last 3 results) No results for input(s): PROBNP in the last 8760 hours. HbA1C: Recent Labs    05/17/23 0250  HGBA1C 6.0*   CBG: No results for input(s): GLUCAP in the last 168 hours. Lipid Profile: No results for input(s): CHOL, HDL, LDLCALC, TRIG, CHOLHDL, LDLDIRECT in the last 72 hours. Thyroid Function Tests: No results for input(s): TSH, T4TOTAL, FREET4, T3FREE, THYROIDAB in the last 72 hours. Anemia Panel: No results for input(s): VITAMINB12, FOLATE, FERRITIN, TIBC, IRON, RETICCTPCT in the last 72 hours. Urine analysis:    Component Value Date/Time   COLORURINE COLORLESS (A) 11/22/2018 1900   APPEARANCEUR CLEAR 11/22/2018 1900   LABSPEC 1.002 (L) 11/22/2018 1900   PHURINE 6.0 11/22/2018 1900   GLUCOSEU NEGATIVE 11/22/2018 1900   HGBUR NEGATIVE 11/22/2018 1900   BILIRUBINUR NEGATIVE 11/22/2018 1900   KETONESUR NEGATIVE 11/22/2018 1900   PROTEINUR NEGATIVE 11/22/2018 1900   NITRITE NEGATIVE 11/22/2018 1900   LEUKOCYTESUR NEGATIVE 11/22/2018 1900   Sepsis Labs: @LABRCNTIP (procalcitonin:4,lacticidven:4)  ) Recent Results (from the past 240 hours)  Resp panel by RT-PCR (RSV, Flu A&B, Covid) Anterior Nasal Swab     Status: None   Collection Time: 05/15/23  2:43 PM   Specimen: Anterior Nasal  Swab  Result Value Ref Range Status   SARS Coronavirus 2 by RT PCR NEGATIVE NEGATIVE Final   Influenza A by PCR NEGATIVE NEGATIVE Final   Influenza B by PCR NEGATIVE NEGATIVE Final    Comment: (NOTE) The Xpert Xpress SARS-CoV-2/FLU/RSV plus assay is intended as an aid in the diagnosis of influenza from Nasopharyngeal swab specimens and should not be used as a sole basis for treatment. Nasal washings and aspirates are unacceptable for Xpert Xpress SARS-CoV-2/FLU/RSV testing.  Fact Sheet for Patients: bloggercourse.com  Fact Sheet for Healthcare Providers: seriousbroker.it  This test is not yet approved or cleared by the [place]  FDA and has been authorized for detection and/or diagnosis of SARS-CoV-2 by FDA under an Emergency Use Authorization (EUA). This EUA will  remain in effect (meaning this test can be used) for the duration of the COVID-19 declaration under Section 564(b)(1) of the Act, 21 U.S.C. section 360bbb-3(b)(1), unless the authorization is terminated or revoked.     Resp Syncytial Virus by PCR NEGATIVE NEGATIVE Final    Comment: (NOTE) Fact Sheet for Patients: bloggercourse.com  Fact Sheet for Healthcare Providers: seriousbroker.it  This test is not yet approved or cleared by the [place]  FDA and has been authorized for detection and/or diagnosis of SARS-CoV-2 by FDA under an Emergency Use Authorization (EUA). This EUA will remain in effect (meaning this test can be used) for the duration of the COVID-19 declaration under Section 564(b)(1) of the Act, 21 U.S.C. section 360bbb-3(b)(1), unless the authorization is terminated or revoked.  Performed at [facility], [address], [place], [place] [address]      Radiology Studies: CT ANGIO CHEST AORTA W/CM & OR WO/CM Result Date: 05/19/2023 CLINICAL DATA:  Preoperative, TAVR radiology over-read  EXAM: CTA CHEST ABDOMEN AND PELVIS WITHOUT AND WITH CONTRAST TECHNIQUE: Multidetector CT imaging of the chest, abdomen and pelvis was performed using the standard protocol during bolus administration of intravenous contrast. Multiplanar reconstructed images and MIPs were obtained and reviewed to evaluate the vascular anatomy. RADIATION DOSE REDUCTION: This exam was performed according to the departmental dose-optimization program which includes automated exposure control, adjustment of the mA and/or kV according to patient size and/or use of iterative reconstruction technique. CONTRAST:  95mL OMNIPAQUE  IOHEXOL  350 MG/ML SOLN COMPARISON:  CT chest, 11/17/2022 FINDINGS: CTA CHEST FINDINGS VASCULAR Aorta: Satisfactory opacification of the aorta dense aortic valve calcifications. Normal contour and caliber of the thoracic aorta. No evidence of aneurysm, dissection, or other acute aortic pathology. Mild thoracic aortic atherosclerosis. Cardiovascular: No evidence of obvious pulmonary embolism on limited non-tailored examination with no meaningful opacification of the pulmonary arteries. Enlargement of the main pulmonary artery measuring up to 3.8 cm in caliber. Cardiomegaly. Three-vessel coronary artery calcifications and or stents. No pericardial effusion. Review of the MIP images confirms the above findings. NON VASCULAR Mediastinum/Nodes: No enlarged mediastinal, hilar, or axillary lymph nodes. Thyroid gland, trachea, and esophagus demonstrate no significant findings. Lungs/Pleura: Moderate bilateral pleural effusions and associated atelectasis or consolidation. Interlobular septal thickening and mosaic ground-glass airspace attenuation throughout the lungs. Musculoskeletal: No chest wall abnormality. No acute osseous findings. Review of the MIP images confirms the above findings. CTA ABDOMEN AND PELVIS FINDINGS VASCULAR Normal contour and caliber of the abdominal aorta with no significant tortuosity of the abdominal  aorta and mild tortuosity of the iliac systems. Mild mixed calcific atherosclerosis. No evidence of aneurysm, dissection, or other acute aortic pathology. Standard branching pattern of the abdominal aorta with solitary bilateral renal arteries. Review of the MIP images confirms the above findings. NON-VASCULAR Hepatobiliary: No solid liver abnormality is seen. No gallstones, gallbladder wall thickening, or biliary dilatation. Pancreas: Unremarkable. No pancreatic ductal dilatation or surrounding inflammatory changes. Spleen: Normal in size without significant abnormality. Adrenals/Urinary Tract: Unchanged, definitively benign macroscopic fat containing left adrenal adenoma, for which no further follow-up or characterization is required. Kidneys are normal, without renal calculi, solid lesion, or hydronephrosis. Bladder is unremarkable. Stomach/Bowel: Stomach is within normal limits. Appendix appears normal. No evidence of bowel wall thickening, distention, or inflammatory changes. Occasional sigmoid diverticula. Lymphatic: No enlarged abdominal or pelvic lymph nodes. Reproductive: No obvious abnormality. Evaluation of the low pelvis very limited by dense metallic streak artifact. Other: No abdominal wall hernia or abnormality. No ascites. Musculoskeletal: No acute  osseous findings. Status post bilateral hip total arthroplasty with associated dense metallic streak artifact, which limits evaluation of the low pelvis. Please note that definitively benign incidental findings, functional findings, and anatomic variants may have been intentionally omitted from this report in the interest of brevity and clarity. If not specifically noted and recommended in the impression, no further follow-up or characterization is required for incidental findings. IMPRESSION: 1. Dense aortic valve calcifications in keeping with aortic stenosis. 2. Normal contour and caliber of the thoracic and abdominal aorta with no significant  tortuosity of the abdominal aorta and mild tortuosity of the iliac systems. Generally mild mixed calcific atherosclerosis. No evidence of aneurysm, dissection, or other acute aortic pathology. Standard branching pattern of the abdominal aorta. 3. Please see separately provided cardiology report for detailed measurements and parameters relevant to TAVR procedure. 4. Pulmonary edema with moderate bilateral pleural effusions and associated atelectasis or consolidation. 5. Cardiomegaly and coronary artery disease. 6. Enlargement of the main pulmonary artery, as can be seen in pulmonary hypertension. Aortic Atherosclerosis (ICD10-I70.0). Electronically Signed   By: Marolyn JONETTA Jaksch M.D.   On: 05/19/2023 09:56   CT Angio Abd/Pel w/ and/or w/o Result Date: 05/19/2023 CLINICAL DATA:  Preoperative, TAVR radiology over-read EXAM: CTA CHEST ABDOMEN AND PELVIS WITHOUT AND WITH CONTRAST TECHNIQUE: Multidetector CT imaging of the chest, abdomen and pelvis was performed using the standard protocol during bolus administration of intravenous contrast. Multiplanar reconstructed images and MIPs were obtained and reviewed to evaluate the vascular anatomy. RADIATION DOSE REDUCTION: This exam was performed according to the departmental dose-optimization program which includes automated exposure control, adjustment of the mA and/or kV according to patient size and/or use of iterative reconstruction technique. CONTRAST:  95mL OMNIPAQUE  IOHEXOL  350 MG/ML SOLN COMPARISON:  CT chest, 11/17/2022 FINDINGS: CTA CHEST FINDINGS VASCULAR Aorta: Satisfactory opacification of the aorta dense aortic valve calcifications. Normal contour and caliber of the thoracic aorta. No evidence of aneurysm, dissection, or other acute aortic pathology. Mild thoracic aortic atherosclerosis. Cardiovascular: No evidence of obvious pulmonary embolism on limited non-tailored examination with no meaningful opacification of the pulmonary arteries. Enlargement of the main  pulmonary artery measuring up to 3.8 cm in caliber. Cardiomegaly. Three-vessel coronary artery calcifications and or stents. No pericardial effusion. Review of the MIP images confirms the above findings. NON VASCULAR Mediastinum/Nodes: No enlarged mediastinal, hilar, or axillary lymph nodes. Thyroid gland, trachea, and esophagus demonstrate no significant findings. Lungs/Pleura: Moderate bilateral pleural effusions and associated atelectasis or consolidation. Interlobular septal thickening and mosaic ground-glass airspace attenuation throughout the lungs. Musculoskeletal: No chest wall abnormality. No acute osseous findings. Review of the MIP images confirms the above findings. CTA ABDOMEN AND PELVIS FINDINGS VASCULAR Normal contour and caliber of the abdominal aorta with no significant tortuosity of the abdominal aorta and mild tortuosity of the iliac systems. Mild mixed calcific atherosclerosis. No evidence of aneurysm, dissection, or other acute aortic pathology. Standard branching pattern of the abdominal aorta with solitary bilateral renal arteries. Review of the MIP images confirms the above findings. NON-VASCULAR Hepatobiliary: No solid liver abnormality is seen. No gallstones, gallbladder wall thickening, or biliary dilatation. Pancreas: Unremarkable. No pancreatic ductal dilatation or surrounding inflammatory changes. Spleen: Normal in size without significant abnormality. Adrenals/Urinary Tract: Unchanged, definitively benign macroscopic fat containing left adrenal adenoma, for which no further follow-up or characterization is required. Kidneys are normal, without renal calculi, solid lesion, or hydronephrosis. Bladder is unremarkable. Stomach/Bowel: Stomach is within normal limits. Appendix appears normal. No evidence of bowel wall thickening, distention, or  inflammatory changes. Occasional sigmoid diverticula. Lymphatic: No enlarged abdominal or pelvic lymph nodes. Reproductive: No obvious abnormality.  Evaluation of the low pelvis very limited by dense metallic streak artifact. Other: No abdominal wall hernia or abnormality. No ascites. Musculoskeletal: No acute osseous findings. Status post bilateral hip total arthroplasty with associated dense metallic streak artifact, which limits evaluation of the low pelvis. Please note that definitively benign incidental findings, functional findings, and anatomic variants may have been intentionally omitted from this report in the interest of brevity and clarity. If not specifically noted and recommended in the impression, no further follow-up or characterization is required for incidental findings. IMPRESSION: 1. Dense aortic valve calcifications in keeping with aortic stenosis. 2. Normal contour and caliber of the thoracic and abdominal aorta with no significant tortuosity of the abdominal aorta and mild tortuosity of the iliac systems. Generally mild mixed calcific atherosclerosis. No evidence of aneurysm, dissection, or other acute aortic pathology. Standard branching pattern of the abdominal aorta. 3. Please see separately provided cardiology report for detailed measurements and parameters relevant to TAVR procedure. 4. Pulmonary edema with moderate bilateral pleural effusions and associated atelectasis or consolidation. 5. Cardiomegaly and coronary artery disease. 6. Enlargement of the main pulmonary artery, as can be seen in pulmonary hypertension. Aortic Atherosclerosis (ICD10-I70.0). Electronically Signed   By: Marolyn JONETTA Jaksch M.D.   On: 05/19/2023 09:56   CT CORONARY MORPH W/CTA COR W/SCORE W/CA W/CM &/OR WO/CM Result Date: 05/19/2023 CLINICAL DATA:  Preoperative, TAVR radiology over-read EXAM: CTA CHEST ABDOMEN AND PELVIS WITHOUT AND WITH CONTRAST TECHNIQUE: Multidetector CT imaging of the chest, abdomen and pelvis was performed using the standard protocol during bolus administration of intravenous contrast. Multiplanar reconstructed images and MIPs were obtained  and reviewed to evaluate the vascular anatomy. RADIATION DOSE REDUCTION: This exam was performed according to the departmental dose-optimization program which includes automated exposure control, adjustment of the mA and/or kV according to patient size and/or use of iterative reconstruction technique. CONTRAST:  95mL OMNIPAQUE  IOHEXOL  350 MG/ML SOLN COMPARISON:  CT chest, 11/17/2022 FINDINGS: CTA CHEST FINDINGS VASCULAR Aorta: Satisfactory opacification of the aorta dense aortic valve calcifications. Normal contour and caliber of the thoracic aorta. No evidence of aneurysm, dissection, or other acute aortic pathology. Mild thoracic aortic atherosclerosis. Cardiovascular: No evidence of obvious pulmonary embolism on limited non-tailored examination with no meaningful opacification of the pulmonary arteries. Enlargement of the main pulmonary artery measuring up to 3.8 cm in caliber. Cardiomegaly. Three-vessel coronary artery calcifications and or stents. No pericardial effusion. Review of the MIP images confirms the above findings. NON VASCULAR Mediastinum/Nodes: No enlarged mediastinal, hilar, or axillary lymph nodes. Thyroid gland, trachea, and esophagus demonstrate no significant findings. Lungs/Pleura: Moderate bilateral pleural effusions and associated atelectasis or consolidation. Interlobular septal thickening and mosaic ground-glass airspace attenuation throughout the lungs. Musculoskeletal: No chest wall abnormality. No acute osseous findings. Review of the MIP images confirms the above findings. CTA ABDOMEN AND PELVIS FINDINGS VASCULAR Normal contour and caliber of the abdominal aorta with no significant tortuosity of the abdominal aorta and mild tortuosity of the iliac systems. Mild mixed calcific atherosclerosis. No evidence of aneurysm, dissection, or other acute aortic pathology. Standard branching pattern of the abdominal aorta with solitary bilateral renal arteries. Review of the MIP images confirms  the above findings. NON-VASCULAR Hepatobiliary: No solid liver abnormality is seen. No gallstones, gallbladder wall thickening, or biliary dilatation. Pancreas: Unremarkable. No pancreatic ductal dilatation or surrounding inflammatory changes. Spleen: Normal in size without significant abnormality. Adrenals/Urinary Tract: Unchanged, definitively  benign macroscopic fat containing left adrenal adenoma, for which no further follow-up or characterization is required. Kidneys are normal, without renal calculi, solid lesion, or hydronephrosis. Bladder is unremarkable. Stomach/Bowel: Stomach is within normal limits. Appendix appears normal. No evidence of bowel wall thickening, distention, or inflammatory changes. Occasional sigmoid diverticula. Lymphatic: No enlarged abdominal or pelvic lymph nodes. Reproductive: No obvious abnormality. Evaluation of the low pelvis very limited by dense metallic streak artifact. Other: No abdominal wall hernia or abnormality. No ascites. Musculoskeletal: No acute osseous findings. Status post bilateral hip total arthroplasty with associated dense metallic streak artifact, which limits evaluation of the low pelvis. Please note that definitively benign incidental findings, functional findings, and anatomic variants may have been intentionally omitted from this report in the interest of brevity and clarity. If not specifically noted and recommended in the impression, no further follow-up or characterization is required for incidental findings. IMPRESSION: 1. Dense aortic valve calcifications in keeping with aortic stenosis. 2. Normal contour and caliber of the thoracic and abdominal aorta with no significant tortuosity of the abdominal aorta and mild tortuosity of the iliac systems. Generally mild mixed calcific atherosclerosis. No evidence of aneurysm, dissection, or other acute aortic pathology. Standard branching pattern of the abdominal aorta. 3. Please see separately provided cardiology  report for detailed measurements and parameters relevant to TAVR procedure. 4. Pulmonary edema with moderate bilateral pleural effusions and associated atelectasis or consolidation. 5. Cardiomegaly and coronary artery disease. 6. Enlargement of the main pulmonary artery, as can be seen in pulmonary hypertension. Aortic Atherosclerosis (ICD10-I70.0). Electronically Signed   By: Marolyn JONETTA Jaksch M.D.   On: 05/19/2023 09:56   ECHOCARDIOGRAM COMPLETE Result Date: 05/18/2023    ECHOCARDIOGRAM REPORT   Patient Name:   Que R Antunes Date of Exam: 05/18/2023 Medical Rec #:  [id number]           Height:       73.0 in Accession #:    [id number]          Weight:       231.0 lb Date of Birth:  Sep 01, 1960           BSA:          2.288 m Patient Age:    62 years            BP:           128/87 mmHg Patient Gender: M                   HR:           71 bpm. Exam Location:  Inpatient Procedure: 2D Echo, Cardiac Doppler, Color Doppler and 3D Echo Indications:    I35.0 Nonrheumatic aortic (valve) stenosis  History:        Patient has prior history of Echocardiogram examinations, most                 recent 02/09/2023. CHF and Cardiomyopathy, CAD, Aortic Valve                 Disease; Risk Factors:Dyslipidemia. Bicuspid aortic valve.  Sonographer:    Ellouise Mose RDCS Referring Phys: [phone] [facility] IMPRESSIONS  1. Left ventricular ejection fraction, by estimation, is 30 to 35%. The left ventricle has moderately decreased function. The left ventricle demonstrates global hypokinesis. There is mild concentric left ventricular hypertrophy. Indeterminate diastolic filling due to E-A fusion. Elevated left ventricular end-diastolic pressure.  2. Right ventricular systolic function is low normal. The right  ventricular size is normal.  3. Left atrial size was moderately dilated.  4. Right atrial size was mildly dilated.  5. Moderate pleural effusion in both left and right lateral regions.  6. The mitral valve is normal in structure.  Trivial mitral valve regurgitation. No evidence of mitral stenosis.  7. The aortic valve is bicuspid. There is severe calcifcation of the aortic valve. There is moderate thickening of the aortic valve. Aortic valve regurgitation is mild. Severe aortic valve stenosis. Aortic valve mean gradient measures 50.0 mmHg. Aortic valve Vmax measures 4.47 m/s.  8. The inferior vena cava is dilated in size with <50% respiratory variability, suggesting right atrial pressure of 15 mmHg. Comparison(s): Changes from prior study are noted. The left ventricular function is significantly worse. Conclusion(s)/Recommendation(s): Now with reduced LVEF, continues to have severe bicuspid aortic stenosis. FINDINGS  Left Ventricle: Left ventricular ejection fraction, by estimation, is 30 to 35%. The left ventricle has moderately decreased function. The left ventricle demonstrates global hypokinesis. The left ventricular internal cavity size was normal in size. There is mild concentric left ventricular hypertrophy. Indeterminate diastolic filling due to E-A fusion. Elevated left ventricular end-diastolic pressure. The E/e' is 49. Right Ventricle: The right ventricular size is normal. Right vetricular wall thickness was not well visualized. Right ventricular systolic function is low normal. Left Atrium: Left atrial size was moderately dilated. Right Atrium: Right atrial size was mildly dilated. Pericardium: There is no evidence of pericardial effusion. Mitral Valve: The mitral valve is normal in structure. Trivial mitral valve regurgitation. No evidence of mitral valve stenosis. MV peak gradient, 9.4 mmHg. The mean mitral valve gradient is 4.0 mmHg. Tricuspid Valve: The tricuspid valve is normal in structure. Tricuspid valve regurgitation is trivial. No evidence of tricuspid stenosis. Aortic Valve: The aortic valve is bicuspid. There is severe calcifcation of the aortic valve. There is moderate thickening of the aortic valve. Aortic valve  regurgitation is mild. Severe aortic stenosis is present. Aortic valve mean gradient measures 50.0 mmHg. Aortic valve peak gradient measures 80.0 mmHg. Aortic valve area, by VTI measures 1.15 cm. Pulmonic Valve: The pulmonic valve was not well visualized. Pulmonic valve regurgitation is trivial. No evidence of pulmonic stenosis. Aorta: The aortic root and ascending aorta are structurally normal, with no evidence of dilitation. Venous: The inferior vena cava is dilated in size with less than 50% respiratory variability, suggesting right atrial pressure of 15 mmHg. IAS/Shunts: The atrial septum is grossly normal. Additional Comments: There is a moderate pleural effusion in both left and right lateral regions.  LEFT VENTRICLE PLAX 2D LVIDd:         5.20 cm      Diastology LVIDs:         4.70 cm      LV e' medial:    3.05 cm/s LV PW:         1.30 cm      LV E/e' medial:  40.7 LV IVS:        1.00 cm      LV e' lateral:   5.77 cm/s LVOT diam:     2.50 cm      LV E/e' lateral: 21.5 LV SV:         120 LV SV Index:   53 LVOT Area:     4.91 cm  LV Volumes (MOD) LV vol d, MOD A2C: 204.0 ml LV vol d, MOD A4C: 172.0 ml LV vol s, MOD A2C: 123.0 ml LV vol s, MOD A4C: 121.0 ml  LV SV MOD A2C:     81.0 ml LV SV MOD A4C:     172.0 ml LV SV MOD BP:      64.5 ml RIGHT VENTRICLE            IVC RV S prime:     8.16 cm/s  IVC diam: 2.20 cm TAPSE (M-mode): 1.7 cm LEFT ATRIUM           Index        RIGHT ATRIUM           Index LA diam:      5.40 cm 2.36 cm/m   RA Area:     20.30 cm LA Vol (A2C): 93.4 ml 40.83 ml/m  RA Volume:   63.40 ml  27.71 ml/m LA Vol (A4C): 57.3 ml 25.05 ml/m  AORTIC VALVE AV Area (Vmax):    1.27 cm AV Area (Vmean):   1.21 cm AV Area (VTI):     1.15 cm AV Vmax:           447.20 cm/s AV Vmean:          311.400 cm/s AV VTI:            1.044 m AV Peak Grad:      80.0 mmHg AV Mean Grad:      50.0 mmHg LVOT Vmax:         116.00 cm/s LVOT Vmean:        76.700 cm/s LVOT VTI:          0.245 m LVOT/AV VTI ratio: 0.23   AORTA Ao Root diam: 3.70 cm Ao Asc diam:  3.70 cm MITRAL VALVE MV Area (PHT): 5.13 cm     SHUNTS MV Area VTI:   2.96 cm     Systemic VTI:  0.24 m MV Peak grad:  9.4 mmHg     Systemic Diam: 2.50 cm MV Mean grad:  4.0 mmHg MV Vmax:       1.53 m/s MV Vmean:      88.5 cm/s MV Decel Time: 148 msec MV E velocity: 124.00 cm/s Shelda Bruckner MD Electronically signed by Shelda Bruckner MD Signature Date/Time: 05/18/2023/7:08:45 PM    Final    CARDIAC CATHETERIZATION Result Date: 05/18/2023 Conclusions: Mild, non-obstructive coronary artery disease with 20% proximal RCA stenosis and mild luminal irregularities in the LAD and LCx. Patent mid LAD and proximal RCA stents with mild (~10%) in-stent restenosis. Severe elevated left heart, right heart, and pulmonary artery pressures (see details below). Severely reduced Fick cardiac output/index. Recommendations: Escalate diuresis; will transition back to furosemide  40 mg IV BID. Hold carvedilol  in the setting of severely reduced cardiac output and decompensated heart failure. Proceed with workup for aortic valve intervention per structural heart team. Continue secondary prevention of coronary artery disease. Bruckner Hanson, MD [facility]      Scheduled Meds:  aspirin  EC  81 mg Oral Daily   enoxaparin  (LOVENOX ) injection  40 mg Subcutaneous Q24H   furosemide   40 mg Intravenous BID   mupirocin  cream   Topical Daily   sodium chloride  flush  3 mL Intravenous Q12H   sodium chloride  flush  3 mL Intravenous Q12H   Continuous Infusions:  sodium chloride        LOS: 4 days    Time spent:    Sigurd Pac, MD [facility]   05/19/2023, 10:08 AM

## 2023-05-19 NOTE — Consult Note (Signed)
 HEART AND VASCULAR CENTER   MULTIDISCIPLINARY HEART VALVE TEAM  Cardiology Consultation:   Patient ID: YAROSLAV GOMBOS MRN: [id number]; DOB: 20-Apr-1960  Admit date: 05/15/2023 Date of Consult: 05/19/2023  Primary Care Provider: Vicci Barnie NOVAK, MD [facility] Cardiologist: Jerel Balding, MD  [facility] Electrophysiologist:  None    Patient Profile:   RUMI TARAS is a 63 y.o. male with a hx of CAD s/p DES to LAD/RCA (2018), HFimpEF, HLD, HTN, obesity (BMI 30) and severe aortic stenosis who is being seen today for the evaluation of critical AS and new LV dysfunction with acute CHF at the request of Dr. Raford.  History of Present Illness:   He presented with CHF in 2018 and was found to have severe LV systolic dysfunction with LVEF=25-30%. Cardiac cath with severe two vessel CAD. Drug eluting stents were placed in the LAD and RCA. LV function returned to normal following his revascularization. He has been followed for bicuspid aortic stenosis in our office by Dr. Balding. Echo in 07/2022 showed EF 55-60% with progression to severe AS with a mean gradient of 42 mmhg. He was referred to structural heart and seen by Dr. Verlin in 08/2022. He was asymptomatic and so it was elected to pursue clinical surveillance. Repeat echo in 01/2023 showed EF 55%, mild LVH, and worsening AS with a mean gradient of 53mm hg and he was seen back in the office on 02/18/23 by Dr. Verlin. He remained asymptomatic and 6 month follow up was recommended.   He presented to [facility] on 05/15[facility]23 with shortness of breath x3 weeks and found to be in acute CHF and admitted for IV diuresis. Echo 05/18/23 showed EF 30-35%, mild LVH, global hypokinesis, moderate L/R pleural effusions, bicuspid AoV with mild AI and critical AS with a mean gradient of 50 mmg hg, peak gradient 80 mm hg, AVA 1.12cm2, DVI 0.23. [facility] 05/18/23 showed mild non obstructive CAD and patent mLAD and pRCA stents with ~10% ISR as well as  severely elevated left heart, right heart, and pulmonary artery pressures and fick cardiac output/index. He was started back on IV lasix  (had just been transitioned back to PO) and Coreg  held in the setting of severely reduced cardiac output and decompensated heart failure. Pre TAVR CTs completed 05/19/23. Cardiac gated CTA of the heart revealed a bicuspid AoV with R-L fusion and heavily calcified raphe AoV calcium  score 5234 with anatomy suitable for a 29 mm Edwards S3UR. CTA of the aorta and iliac vessels demonstrated what appears to be adequate pelvic vascular access to facilitate a transfemoral approach. CTA also noted pulmonary edema and moderate bilateral pleural effusions. He underwent successful US  guided right thoracentesis with removal of 1.3 L of redish fluid. Fluid sent for cytology. Also pt noted to have frequent ventricular ectopy with a 13 beat run of NSVT and nocturnal CHB. All GDMT and BP meds held currently.    The patient is seen laying in bed. He is quite drowsy but answers appropriately. He said he didn't sleep well last night and falls asleep mid sentence. He reports a 3 week history of shortness of breath, orthopnea and PND. He stays busy tending to his yard mowing, gardening and weed-eating, but hasn't been able to do any of this recently due to his symptoms. Denies chest pain, dizziness or syncope. No LE edema. Abdomen might be a little distended. He lives alone in [place], [place]. He has a brother and sister who live locally. Sister is a retired engineer, civil (consulting) and  involved with his care. He is a retired magazine features editor (stopped working in 2018). He still drives and walks without the aid of a walker or cane. He has not seen the dentist in quite some time and has a broke back right tooth.   Past Medical History:  Diagnosis Date   Aortic stenosis    CAD (coronary artery disease), native coronary artery 12/26/2016   DES LAD & RCA, EF 25-30%   CHF (congestive heart failure) (HCC)     COVID-19 vaccine regimen to maintain immunity completed 04/27/2020   Dermatitis 01/07/2023   Dyslipidemia    Hypertension    Obesity    Osteoarthritis    Pre-diabetes    a. prior A1C 5.6, states he was on meds for this at one point.   Skin lesions 01/06/2023    Past Surgical History:  Procedure Laterality Date   CORONARY STENT INTERVENTION N/A 12/29/2016   Procedure: CORONARY STENT INTERVENTION;  Surgeon: Dann Candyce RAMAN, MD;  Location: [facility] INVASIVE CV LAB;  Service: Cardiovascular;  Laterality: N/A;   IR THORACENTESIS ASP PLEURAL SPACE W/IMG GUIDE  05/19/2023   RIGHT HEART CATH AND CORONARY ANGIOGRAPHY N/A 05/18/2023   Procedure: RIGHT HEART CATH AND CORONARY ANGIOGRAPHY;  Surgeon: Mady Bruckner, MD;  Location: [facility] INVASIVE CV LAB;  Service: Cardiovascular;  Laterality: N/A;   RIGHT/LEFT HEART CATH AND CORONARY ANGIOGRAPHY N/A 12/29/2016   Procedure: RIGHT/LEFT HEART CATH AND CORONARY ANGIOGRAPHY;  Surgeon: Dann Candyce RAMAN, MD;  Location: [facility] INVASIVE CV LAB;  Service: Cardiovascular;  Laterality: N/A;   SHOULDER ARTHROSCOPY WITH ROTATOR CUFF REPAIR AND OPEN BICEPS TENODESIS Right 11/28/2021   Procedure: RIGHT SHOULDER ARTHROSCOPY WITH ROTATOR CUFF REPAIR AND OPEN BICEPS TENODESIS;  Surgeon: Genelle Standing, MD;  Location: [facility] OR;  Service: Orthopedics;  Laterality: Right;   SHOULDER ARTHROSCOPY WITH ROTATOR CUFF REPAIR AND OPEN BICEPS TENODESIS Left 07/21/2022   Procedure: LEFT SHOULDER ARTHROSCOPY WITH ROTATOR CUFF REPAIR AND BICEPS TENODESIS;  Surgeon: Genelle Standing, MD;  Location: [facility] OR;  Service: Orthopedics;  Laterality: Left;   TONSILLECTOMY     TOTAL HIP ARTHROPLASTY Right 04/26/2018   Procedure: RIGHT TOTAL HIP ARTHROPLASTY ANTERIOR APPROACH;  Surgeon: Jerri Kay HERO, MD;  Location: [facility] OR;  Service: Orthopedics;  Laterality: Right;   TOTAL HIP ARTHROPLASTY Left 11/15/2018   TOTAL HIP ARTHROPLASTY Left 11/15/2018   Procedure: LEFT TOTAL HIP ARTHROPLASTY ANTERIOR APPROACH;   Surgeon: Jerri Kay HERO, MD;  Location: [facility] OR;  Service: Orthopedics;  Laterality: Left;     Home Medications:  Prior to Admission medications   Medication Sig Start Date End Date Taking? Authorizing Provider  amLODipine  (NORVASC ) 5 MG tablet Take 1 tablet (5 mg total) by mouth daily. Please make a PCP appointment for more refills. 01/07/23  Yes Vicci Barnie NOVAK, MD  amoxicillin  (AMOXIL ) 500 MG tablet Take 4 tablets (2,000 mg total) by mouth once as needed for up to 1 dose (Take one hour prior to dental procedure). 01/30/23  Yes Croitoru, Mihai, MD  aspirin  EC 81 MG tablet Take 81 mg by mouth daily.   Yes [provider]  carvedilol  (COREG ) 3.125 MG tablet Take 1 tablet (3.125 mg total) by mouth 2 (two) times daily. Patient taking differently: Take 6.25 mg by mouth daily. 04/22/23  Yes Vicci Barnie NOVAK, MD  Evolocumab  (REPATHA  SURECLICK) 140 MG/ML SOAJ INJECT 1 ML INTO THE SKIN EVERY 14 (FOURTEEN) DAYS. 11/10/22  Yes Croitoru, Mihai, MD  fluticasone  (FLONASE ) 50 MCG/ACT nasal spray PLACE 1 SPRAY INTO  BOTH NOSTRILS DAILY AS NEEDED FOR ALLERGIES OR RHINITIS. 09/01/22  Yes Vicci Barnie NOVAK, MD  lisinopril  (ZESTRIL ) 10 MG tablet TAKE 1 TABLET BY MOUTH TWICE A DAY Patient taking differently: Take 20 mg by mouth daily. 02/27/23  Yes Vicci Barnie NOVAK, MD  loratadine (CLARITIN) 10 MG tablet Take 10 mg by mouth daily as needed for allergies.   Yes [provider]  methocarbamol  (ROBAXIN ) 500 MG tablet Take 1 tablet (500 mg total) by mouth 2 (two) times daily as needed for muscle spasms. 10/01/21  Yes Jule Ronal CROME, PA-C  potassium chloride  (KLOR-CON ) 10 MEQ tablet Take 1 tablet (10 mEq total) by mouth daily. Please make PCP appointment. 01/07/23  Yes Vicci Barnie NOVAK, MD    Inpatient Medications: Scheduled Meds:  aspirin  EC  81 mg Oral Daily   furosemide   40 mg Intravenous BID   lidocaine  (PF)  10 mL Infiltration Once   mupirocin  cream   Topical Daily   sodium chloride  flush   3 mL Intravenous Q12H   sodium chloride  flush  3 mL Intravenous Q12H   Continuous Infusions:   PRN Meds: acetaminophen  **OR** acetaminophen , cyclobenzaprine , mouth rinse, polyethylene glycol, sodium chloride  flush  Allergies:    Allergies  Allergen Reactions   Statins Other (See Comments)    Elevated LFTs    Social History:   Social History   Socioeconomic History   Marital status: Single    Spouse name: Not on file   Number of children: 0   Years of education: Not on file   Highest education level: Not on file  Occupational History   Occupation: Retired-Upholstery  Tobacco Use   Smoking status: Former    Current packs/day: 0.00    Types: Cigarettes    Quit date: 08/18/2000    Years since quitting: 22.7    Passive exposure: Past   Smokeless tobacco: Never   Tobacco comments:    Smoked lightly for approx 20 years, quit 1990s  Vaping Use   Vaping status: Never Used  Substance and Sexual Activity   Alcohol use: Yes    Comment: occasionally   Drug use: Yes    Types: Marijuana    Comment: every night (to sleep)   Sexual activity: Not on file  Other Topics Concern   Not on file  Social History Narrative   Lives Home alone. Sister makes all health related decisions. Declined Advanced directive at this time   Social Drivers of Corporate Investment Banker Strain: Not on file  Food Insecurity: No Food Insecurity (05/16/2023)   Hunger Vital Sign    Worried About Running Out of Food in the Last Year: Never true    Ran Out of Food in the Last Year: Never true  Transportation Needs: No Transportation Needs (05/16/2023)   PRAPARE - Administrator, Civil Service (Medical): No    Lack of Transportation (Non-Medical): No  Physical Activity: Not on file  Stress: Not on file  Social Connections: Not on file  Intimate Partner Violence: Not At Risk (05/16/2023)   Humiliation, Afraid, Rape, and Kick questionnaire    Fear of Current or Ex-Partner: No    Emotionally  Abused: No    Physically Abused: No    Sexually Abused: No    Family History:   Family History  Problem Relation Age of Onset   CAD Father        MI/CABG age 50     ROS:  Please see the history of  present illness.  All other ROS reviewed and negative.     Physical Exam/Data:   Vitals:   05/18/23 2314 05/19/23 0406 05/19/23 0754 05/19/23 1134  BP: 126/85 118/86 (!) 120/91 104/82  Pulse: 72  84 75  Resp: 18 (!) 25 20 20   Temp: 97.7 F (36.5 C) 98 F (36.7 C) 98 F (36.7 C) 98 F (36.7 C)  TempSrc: Oral Oral Oral Oral  SpO2: 97% 96% 96% 96%  Weight:      Height:        Intake/Output Summary (Last 24 hours) at 05/19/2023 1659 Last data filed at 05/19/2023 1345 Gross per 24 hour  Intake 1320 ml  Output 5275 ml  Net -3955 ml      05/18/2023    5:58 AM 05/17/2023    4:15 AM 05/16/2023    8:00 PM  Last 3 Weights  Weight (lbs) 231 lb 0.7 oz 231 lb 14.8 oz 234 lb 9.1 oz  Weight (kg) 104.8 kg 105.2 kg 106.4 kg     Body mass index is 30.48 kg/m.  General:  Well nourished, well developed. Appear lethargic  HEENT: normal Lymph: no adenopathy Neck: no JVD Cardiac:  normal S1, S2; RRR; 3/6 harsh SEM with absent S2 heard best at LLSB.  Lungs:  decreased breath sounds at bases, L>R Abd: soft, nontender Ext: no edema Musculoskeletal:  No deformities, BUE and BLE strength normal and equal Skin: warm and dry  Neuro:  CNs 2-12 intact, no focal abnormalities noted Psych:  Normal affect ,drowsy   EKG:  The EKG was personally reviewed and demonstrates:  sinus with IVCD in LBBB pattern, HR 64 Telemetry:  Telemetry was personally reviewed and demonstrates:  sinus with frequent PVCs and 13 beat run NSVT.   Cardiac Studies & Procedures   CARDIAC CATHETERIZATION  CARDIAC CATHETERIZATION 05/18/2023  Narrative Conclusions: Mild, non-obstructive coronary artery disease with 20% proximal RCA stenosis and mild luminal irregularities in the LAD and LCx. Patent mid LAD and proximal RCA  stents with mild (~10%) in-stent restenosis. Severe elevated left heart, right heart, and pulmonary artery pressures (see details below). Severely reduced Fick cardiac output/index.  Recommendations: Escalate diuresis; will transition back to furosemide  40 mg IV BID. Hold carvedilol  in the setting of severely reduced cardiac output and decompensated heart failure. Proceed with workup for aortic valve intervention per structural heart team. Continue secondary prevention of coronary artery disease.  Lonni Hanson, MD [facility]  Findings Coronary Findings Diagnostic  Dominance: Right  Left Main Vessel is large. Vessel is angiographically normal.  Left Anterior Descending Vessel is large. There is mild diffuse disease throughout the vessel. Mid LAD lesion is 10% stenosed. The lesion was previously treated using a drug eluting stent over 2 years ago. Previously placed stent displays restenosis.  First Diagonal Branch Vessel is small in size.  Third Diagonal Branch Vessel is moderate in size.  Left Circumflex Vessel is large. There is mild diffuse disease throughout the vessel.  First Obtuse Marginal Branch Vessel is small in size.  Second Obtuse Marginal Branch Vessel is small in size.  Third Obtuse Marginal Branch Vessel is large in size.  Fourth Obtuse Marginal Branch Vessel is moderate in size.  Right Coronary Artery Vessel is large. Prox RCA-1 lesion is 20% stenosed. Prox RCA-2 lesion is 10% stenosed. The lesion was previously treated using a drug eluting stent over 2 years ago. Previously placed stent displays restenosis.  Right Posterior Descending Artery Vessel is moderate in size.  Right Posterior  Atrioventricular Artery Vessel is small in size.  First Right Posterolateral Branch Vessel is small in size.  Intervention  No interventions have been documented.   CARDIAC CATHETERIZATION  CARDIAC CATHETERIZATION 12/29/2016  Narrative  Prox RCA  lesion, 25 %stenosed.  Dist RCA lesion, 25 %stenosed.  Dist Cx lesion, 60 %stenosed.  Prox Cx to Mid Cx lesion, 25 %stenosed.  There is moderate to severe left ventricular systolic dysfunction.  The left ventricular ejection fraction is 25-35% by visual estimate.  LV end diastolic pressure is moderately elevated.  There is no aortic valve stenosis.  Mid RCA lesion, 75 %stenosed. A STENT RESOLUTE ONYX 4.0X30 drug eluting stent was successfully placed.  Post intervention, there is a 0% residual stenosis.  Mid LAD lesion, 75 %stenosed. A STENT RESOLUTE ONYX [id number] drug eluting stent was successfully placed, post dilated to > 3 mm.  Post intervention, there is a 0% residual stenosis.  Hemodynamic findings consistent with mild pulmonary hypertension.  Ao sat 90%, PA sat 62%; CO 4.4 L/min, CI 2.1; mean PA pressure 34 mm Hg  Severe two vessel CAD which likely contributed to cardiomyopathy.  Aortic valve was easily crossed several times.  0.035 wire would cross aortic valve on its own during catheter exchanges.  Mean Ao valve gradient 9 mm Hg.  Ao valve area 1.94 cm2.  After discussion with Dr. Jeffrie, we decided to proceed with PCI since aortic valve intervention was not required.  Findings Coronary Findings Diagnostic  Dominance: Right  Left Anterior Descending The lesion is located at the bifurcation.  Left Circumflex  Right Coronary Artery  Intervention  Mid LAD lesion Angioplasty Lesion crossed with guidewire using a WIRE ASAHI PROWATER 180CM. Pre-stent angioplasty was performed using a BALLOON SAPPHIRE 2.5X15. A STENT RESOLUTE ONYX [id number] drug eluting stent was successfully placed. Stent strut is well apposed. Post-stent angioplasty was performed using a BALLOON Mylo EUPHORA RX3.0X12. The pre-interventional distal flow is normal (TIMI 3).  The post-interventional distal flow is normal (TIMI 3). No complications occurred at this lesion. There is a 0% residual stenosis  post intervention.  Mid RCA lesion Angioplasty Lesion crossed with guidewire using a WIRE ASAHI PROWATER 180CM. Pre-stent angioplasty was performed using a BALLOON SAPPHIRE 3.0X20. A STENT RESOLUTE ONYX 4.0X30 drug eluting stent was successfully placed. Stent strut is well apposed. Post-stent angioplasty was not performed. The pre-interventional distal flow is normal (TIMI 3).  The post-interventional distal flow is normal (TIMI 3). No complications occurred at this lesion. There is a 0% residual stenosis post intervention.    ECHOCARDIOGRAM  ECHOCARDIOGRAM COMPLETE 05/18/2023  Narrative ECHOCARDIOGRAM REPORT    Patient Name:   Kameran R Davidoff Date of Exam: 05/18/2023 Medical Rec #:  [id number]           Height:       73.0 in Accession #:    [id number]          Weight:       231.0 lb Date of Birth:  December 08, 1960           BSA:          2.288 m Patient Age:    62 years            BP:           128/87 mmHg Patient Gender: M                   HR:           71 bpm. Exam  Location:  Inpatient  Procedure: 2D Echo, Cardiac Doppler, Color Doppler and 3D Echo  Indications:    I35.0 Nonrheumatic aortic (valve) stenosis  History:        Patient has prior history of Echocardiogram examinations, most recent 02/09/2023. CHF and Cardiomyopathy, CAD, Aortic Valve Disease; Risk Factors:Dyslipidemia. Bicuspid aortic valve.  Sonographer:    Ellouise Mose RDCS Referring Phys: [phone] CHRISTOPHER END  IMPRESSIONS   1. Left ventricular ejection fraction, by estimation, is 30 to 35%. The left ventricle has moderately decreased function. The left ventricle demonstrates global hypokinesis. There is mild concentric left ventricular hypertrophy. Indeterminate diastolic filling due to E-A fusion. Elevated left ventricular end-diastolic pressure. 2. Right ventricular systolic function is low normal. The right ventricular size is normal. 3. Left atrial size was moderately dilated. 4. Right atrial size was mildly  dilated. 5. Moderate pleural effusion in both left and right lateral regions. 6. The mitral valve is normal in structure. Trivial mitral valve regurgitation. No evidence of mitral stenosis. 7. The aortic valve is bicuspid. There is severe calcifcation of the aortic valve. There is moderate thickening of the aortic valve. Aortic valve regurgitation is mild. Severe aortic valve stenosis. Aortic valve mean gradient measures 50.0 mmHg. Aortic valve Vmax measures 4.47 m/s. 8. The inferior vena cava is dilated in size with <50% respiratory variability, suggesting right atrial pressure of 15 mmHg.  Comparison(s): Changes from prior study are noted. The left ventricular function is significantly worse.  Conclusion(s)/Recommendation(s): Now with reduced LVEF, continues to have severe bicuspid aortic stenosis.  FINDINGS Left Ventricle: Left ventricular ejection fraction, by estimation, is 30 to 35%. The left ventricle has moderately decreased function. The left ventricle demonstrates global hypokinesis. The left ventricular internal cavity size was normal in size. There is mild concentric left ventricular hypertrophy. Indeterminate diastolic filling due to E-A fusion. Elevated left ventricular end-diastolic pressure. The E/e' is 54.  Right Ventricle: The right ventricular size is normal. Right vetricular wall thickness was not well visualized. Right ventricular systolic function is low normal.  Left Atrium: Left atrial size was moderately dilated.  Right Atrium: Right atrial size was mildly dilated.  Pericardium: There is no evidence of pericardial effusion.  Mitral Valve: The mitral valve is normal in structure. Trivial mitral valve regurgitation. No evidence of mitral valve stenosis. MV peak gradient, 9.4 mmHg. The mean mitral valve gradient is 4.0 mmHg.  Tricuspid Valve: The tricuspid valve is normal in structure. Tricuspid valve regurgitation is trivial. No evidence of tricuspid  stenosis.  Aortic Valve: The aortic valve is bicuspid. There is severe calcifcation of the aortic valve. There is moderate thickening of the aortic valve. Aortic valve regurgitation is mild. Severe aortic stenosis is present. Aortic valve mean gradient measures 50.0 mmHg. Aortic valve peak gradient measures 80.0 mmHg. Aortic valve area, by VTI measures 1.15 cm.  Pulmonic Valve: The pulmonic valve was not well visualized. Pulmonic valve regurgitation is trivial. No evidence of pulmonic stenosis.  Aorta: The aortic root and ascending aorta are structurally normal, with no evidence of dilitation.  Venous: The inferior vena cava is dilated in size with less than 50% respiratory variability, suggesting right atrial pressure of 15 mmHg.  IAS/Shunts: The atrial septum is grossly normal.  Additional Comments: There is a moderate pleural effusion in both left and right lateral regions.   LEFT VENTRICLE PLAX 2D LVIDd:         5.20 cm      Diastology LVIDs:  4.70 cm      LV e' medial:    3.05 cm/s LV PW:         1.30 cm      LV E/e' medial:  40.7 LV IVS:        1.00 cm      LV e' lateral:   5.77 cm/s LVOT diam:     2.50 cm      LV E/e' lateral: 21.5 LV SV:         120 LV SV Index:   53 LVOT Area:     4.91 cm  LV Volumes (MOD) LV vol d, MOD A2C: 204.0 ml LV vol d, MOD A4C: 172.0 ml LV vol s, MOD A2C: 123.0 ml LV vol s, MOD A4C: 121.0 ml LV SV MOD A2C:     81.0 ml LV SV MOD A4C:     172.0 ml LV SV MOD BP:      64.5 ml  RIGHT VENTRICLE            IVC RV S prime:     8.16 cm/s  IVC diam: 2.20 cm TAPSE (M-mode): 1.7 cm  LEFT ATRIUM           Index        RIGHT ATRIUM           Index LA diam:      5.40 cm 2.36 cm/m   RA Area:     20.30 cm LA Vol (A2C): 93.4 ml 40.83 ml/m  RA Volume:   63.40 ml  27.71 ml/m LA Vol (A4C): 57.3 ml 25.05 ml/m AORTIC VALVE AV Area (Vmax):    1.27 cm AV Area (Vmean):   1.21 cm AV Area (VTI):     1.15 cm AV Vmax:           447.20 cm/s AV  Vmean:          311.400 cm/s AV VTI:            1.044 m AV Peak Grad:      80.0 mmHg AV Mean Grad:      50.0 mmHg LVOT Vmax:         116.00 cm/s LVOT Vmean:        76.700 cm/s LVOT VTI:          0.245 m LVOT/AV VTI ratio: 0.23  AORTA Ao Root diam: 3.70 cm Ao Asc diam:  3.70 cm  MITRAL VALVE MV Area (PHT): 5.13 cm     SHUNTS MV Area VTI:   2.96 cm     Systemic VTI:  0.24 m MV Peak grad:  9.4 mmHg     Systemic Diam: 2.50 cm MV Mean grad:  4.0 mmHg MV Vmax:       1.53 m/s MV Vmean:      88.5 cm/s MV Decel Time: 148 msec MV E velocity: 124.00 cm/s  Shelda Bruckner MD Electronically signed by Shelda Bruckner MD Signature Date/Time: 05/18/2023/7:08:45 PM    Final    CT SCANS  CT CORONARY MORPH W/CTA COR W/SCORE 05/19/2023  Addendum 05/19/2023 11:53 AM ADDENDUM REPORT: 05/19/2023 11:50  CLINICAL DATA:  Aortic Valve pathology with assessment for TAVR  EXAM: Cardiac TAVR CT  TECHNIQUE: The patient was scanned on a Siemens Force 192 slice scanner. A 120 kV retrospective scan was triggered in the descending thoracic aorta at 111 HU's. Gantry rotation speed was 270 msecs and collimation was .9 mm. No beta blockade or nitro were given. The  3D data set was reconstructed in 5% intervals of the R-R cycle. Systolic and diastolic phases were analyzed on a dedicated work station using MPR, MIP and VRT modes. The patient received 95 cc of contrast.  FINDINGS: Aortic Valve: Severely thickened aortic valve with heavy calcification and reduced excursion the planimeter valve area is 0.979 Sq cm consistent with severe aortic stenosis  Anatomy: Three sinuses with R-L fusion and heavily calcified raphe; bicuspid valve  Annular calcification: Severe- presence of LVOT Calcification  Aortic Valve Calcium  Score: 5234  Presence of basal septal hypertrophy: 15 mm; systolic annular dimensions greater that diastolic and reported below  Perimembranous septal diameter: 5  mm  Mitral Valve: No calcifications  Aortic Annulus Measurements- 20% Phase  Major annulus diameter: 33 mm  Minor annulus diameter:25 mm  Annular perimeter: 92 mm  Annular area: 6.41 cm2  Aortic Measurements- 70% phase  Sinotubular Junction: 32 mm  Ascending Thoracic Aorta: 35 mm  Aortic Arch: 28 mm  Descending Thoracic Aorta: 27 mm  Aortic atherosclerosis.  Sinus of Valsalva Measurements:  Right coronary cusp width: 35 mm  Left coronary cusp width: 39 mm  Non coronary cusp width: 39 mm  Coronary Artery Height above Annulus:  Left Main: 15 mm  Left SoV height: 25 mm  Right Coronary: 21 mm  Right SoV height: 25 mm  Optimum Fluoroscopic Angle for Delivery: LAO 4, CAU 4  Cusp overlay view angle: RAO 0, CAU 9  Valves for structural team consideration:  Sapien 29 mm (scene saved)  Evolut 34 mm; sinus asymmetry noted  Non TAVR Valve Findings:  Coronary Arteries: Normal coronary origin. Study not completed with nitroglycerin .  Coronary Calcium  Score deferred in the setting of prior percutaneous intervention.  Systemic veins: Normal anatomy  Main Pulmonary artery: Severe dilation, 38 mm  Pulmonary veins: Two right middle veins with a short common ostium, normal variant  Left atrial appendage: Patent  Interatrial septum: No communications  Chamber dimensions: Dilated left ventricle  Pericardium: No calcification.  Extra Cardiac Findings as per separate reporting.  Notable artifacts: None  Image quality: Good  IMPRESSION: 1. Severe Aortic stenosis. Findings pertinent to TAVR procedure are detailed above, unless candidate for SAVR.  RECOMMENDATIONS:  The proposed cut-off value of 1,651 AU yielded a 93 % sensitivity and 75 % specificity in grading AS severity in patients with classical low-flow, low-gradient AS. Proposed different cut-off values to define severe AS for men and women as 2,065 AU and 1,274 AU, respectively. The joint  European and American recommendations for the assessment of AS consider the aortic valve calcium  score as a continuum - a very high calcium  score suggests severe AS and a low calcium  score suggests severe AS is unlikely.  Donney VEAR Jarome LULLA Stephen RENETTE, et al. 2017 ESC/EACTS Guidelines for the management of valvular heart disease. Eur Heart J [phone]  Coronary artery calcium  (CAC) score is a strong predictor of incident coronary heart disease (CHD) and provides predictive information beyond traditional risk factors. CAC scoring is reasonable to use in the decision to withhold, postpone, or initiate statin therapy in intermediate-risk or selected borderline-risk asymptomatic adults (age 43-75 years and LDL-C >=70 to <190 mg/dL) who do not have diabetes or established atherosclerotic cardiovascular disease (ASCVD).* In intermediate-risk (10-year ASCVD risk >=7.5% to <20%) adults or selected borderline-risk (10-year ASCVD risk >=5% to <7.5%) adults in whom a CAC score is measured for the purpose of making a treatment decision the following recommendations have been made:  If CAC =  0, it is reasonable to withhold statin therapy and reassess in 5 to 10 years, as long as higher risk conditions are absent (diabetes mellitus, family history of premature CHD in first degree relatives (males <55 years; females <65 years), cigarette smoking, LDL >=190 mg/dL or other independent risk factors).  If CAC is 1 to 99, it is reasonable to initiate statin therapy for patients >=70 years of age.  If CAC is >=100 or >=75th percentile, it is reasonable to initiate statin therapy at any age.  Cardiology referral should be considered for patients with CAC scores >=400 or >=75th percentile.  *2018 AHA/ACC/AACVPR/AAPA/ABC/ACPM/ADA/AGS/APhA/ASPC/NLA/PCNA Guideline on the Management of Blood Cholesterol: A Report of the American College of Cardiology/American Heart Association Task Force on  Clinical Practice Guidelines. J Am Coll Cardiol. 2019;73(24):3168-3209.  Mahesh  Chandrasekhar   Electronically Signed By: Stanly Leavens M.D. On: 05/19/2023 11:50  Addendum 05/19/2023 10:31 AM ADDENDUM REPORT: 05/19/2023 10:29  ADDENDUM: CLINICAL DATA: Preoperative, TAVR radiology over-read  EXAM: CTA CHEST ABDOMEN AND PELVIS WITHOUT AND WITH CONTRAST  TECHNIQUE: Multidetector CT imaging of the chest, abdomen and pelvis was performed using the standard protocol during bolus administration of intravenous contrast. Multiplanar reconstructed images and MIPs were obtained and reviewed to evaluate the vascular anatomy.  RADIATION DOSE REDUCTION: This exam was performed according to the departmental dose-optimization program which includes automated exposure control, adjustment of the mA and/or kV according to patient size and/or use of iterative reconstruction technique.  CONTRAST: 95mL OMNIPAQUE  IOHEXOL  350 MG/ML SOLN  COMPARISON: CT chest, 11/17/2022  FINDINGS: CTA CHEST FINDINGS  VASCULAR  Aorta: Satisfactory opacification of the aorta dense aortic valve calcifications. Normal contour and caliber of the thoracic aorta. No evidence of aneurysm, dissection, or other acute aortic pathology. Mild thoracic aortic atherosclerosis.  Cardiovascular: No evidence of obvious pulmonary embolism on limited non-tailored examination with no meaningful opacification of the pulmonary arteries. Enlargement of the main pulmonary artery measuring up to 3.8 cm in caliber. Cardiomegaly. Three-vessel coronary artery calcifications and or stents. No pericardial effusion.  Review of the MIP images confirms the above findings.  NON VASCULAR  Mediastinum/Nodes: No enlarged mediastinal, hilar, or axillary lymph nodes. Thyroid gland, trachea, and esophagus demonstrate no significant findings.  Lungs/Pleura: Moderate bilateral pleural effusions and associated atelectasis or  consolidation. Interlobular septal thickening and mosaic ground-glass airspace attenuation throughout the lungs.  Musculoskeletal: No chest wall abnormality. No acute osseous findings.  Review of the MIP images confirms the above findings.  CTA ABDOMEN AND PELVIS FINDINGS  VASCULAR  Normal contour and caliber of the abdominal aorta with no significant tortuosity of the abdominal aorta and mild tortuosity of the iliac systems. Mild mixed calcific atherosclerosis. No evidence of aneurysm, dissection, or other acute aortic pathology. Standard branching pattern of the abdominal aorta with solitary bilateral renal arteries.  Review of the MIP images confirms the above findings.  NON-VASCULAR  Hepatobiliary: No solid liver abnormality is seen. No gallstones, gallbladder wall thickening, or biliary dilatation.  Pancreas: Unremarkable. No pancreatic ductal dilatation or surrounding inflammatory changes.  Spleen: Normal in size without significant abnormality.  Adrenals/Urinary Tract: Unchanged, definitively benign macroscopic fat containing left adrenal adenoma, for which no further follow-up or characterization is required. Kidneys are normal, without renal calculi, solid lesion, or hydronephrosis. Bladder is unremarkable.  Stomach/Bowel: Stomach is within normal limits. Appendix appears normal. No evidence of bowel wall thickening, distention, or inflammatory changes. Occasional sigmoid diverticula.  Lymphatic: No enlarged abdominal or pelvic lymph nodes.  Reproductive: No obvious abnormality.  Evaluation of the low pelvis very limited by dense metallic streak artifact.  Other: No abdominal wall hernia or abnormality. No ascites.  Musculoskeletal: No acute osseous findings. Status post bilateral hip total arthroplasty with associated dense metallic streak artifact, which limits evaluation of the low pelvis.  Please note that definitively benign incidental findings,  functional findings, and anatomic variants may have been intentionally omitted from this report in the interest of brevity and clarity. If not specifically noted and recommended in the impression, no further follow-up or characterization is required for incidental findings.  IMPRESSION: 1. Dense aortic valve calcifications in keeping with aortic stenosis. 2. Normal contour and caliber of the thoracic and abdominal aorta with no significant tortuosity of the abdominal aorta and mild tortuosity of the iliac systems. Generally mild mixed calcific atherosclerosis. No evidence of aneurysm, dissection, or other acute aortic pathology. Standard branching pattern of the abdominal aorta. 3. Please see separately provided cardiology report for detailed measurements and parameters relevant to TAVR procedure. 4. Pulmonary edema with moderate bilateral pleural effusions and associated atelectasis or consolidation. 5. Cardiomegaly and coronary artery disease. 6. Enlargement of the main pulmonary artery, as can be seen in pulmonary hypertension.  Aortic Atherosclerosis (ICD10-I70.0).  Electronically Signed By: Marolyn JONETTA Jaksch M.D. On: 05/19/2023 09:56  [Addendum TRUNCATED]  Narrative CLINICAL DATA:  Preoperative, TAVR radiology over-read  EXAM: CTA CHEST ABDOMEN AND PELVIS WITHOUT AND WITH CONTRAST  TECHNIQUE: Multidetector CT imaging of the chest, abdomen and pelvis was performed using the standard protocol during bolus administration of intravenous contrast. Multiplanar reconstructed images and MIPs were obtained and reviewed to evaluate the vascular anatomy.  RADIATION DOSE REDUCTION: This exam was performed according to the departmental dose-optimization program which includes automated exposure control, adjustment of the mA and/or kV according to patient size and/or use of iterative reconstruction technique.  CONTRAST:  95mL OMNIPAQUE  IOHEXOL  350 MG/ML SOLN  COMPARISON:  CT  chest, 11/17/2022  FINDINGS: CTA CHEST FINDINGS  VASCULAR  Aorta: Satisfactory opacification of the aorta dense aortic valve calcifications. Normal contour and caliber of the thoracic aorta. No evidence of aneurysm, dissection, or other acute aortic pathology. Mild thoracic aortic atherosclerosis.  Cardiovascular: No evidence of obvious pulmonary embolism on limited non-tailored examination with no meaningful opacification of the pulmonary arteries. Enlargement of the main pulmonary artery measuring up to 3.8 cm in caliber. Cardiomegaly. Three-vessel coronary artery calcifications and or stents. No pericardial effusion.  Review of the MIP images confirms the above findings.  NON VASCULAR  Mediastinum/Nodes: No enlarged mediastinal, hilar, or axillary lymph nodes. Thyroid gland, trachea, and esophagus demonstrate no significant findings.  Lungs/Pleura: Moderate bilateral pleural effusions and associated atelectasis or consolidation. Interlobular septal thickening and mosaic ground-glass airspace attenuation throughout the lungs.  Musculoskeletal: No chest wall abnormality. No acute osseous findings.  Review of the MIP images confirms the above findings.  CTA ABDOMEN AND PELVIS FINDINGS  VASCULAR  Normal contour and caliber of the abdominal aorta with no significant tortuosity of the abdominal aorta and mild tortuosity of the iliac systems. Mild mixed calcific atherosclerosis. No evidence of aneurysm, dissection, or other acute aortic pathology. Standard branching pattern of the abdominal aorta with solitary bilateral renal arteries.  Review of the MIP images confirms the above findings.  NON-VASCULAR  Hepatobiliary: No solid liver abnormality is seen. No gallstones, gallbladder wall thickening, or biliary dilatation.  Pancreas: Unremarkable. No pancreatic ductal dilatation or surrounding inflammatory changes.  Spleen: Normal in size without significant  abnormality.  Adrenals/Urinary Tract: Unchanged, definitively benign  macroscopic fat containing left adrenal adenoma, for which no further follow-up or characterization is required. Kidneys are normal, without renal calculi, solid lesion, or hydronephrosis. Bladder is unremarkable.  Stomach/Bowel: Stomach is within normal limits. Appendix appears normal. No evidence of bowel wall thickening, distention, or inflammatory changes. Occasional sigmoid diverticula.  Lymphatic: No enlarged abdominal or pelvic lymph nodes.  Reproductive: No obvious abnormality. Evaluation of the low pelvis very limited by dense metallic streak artifact.  Other: No abdominal wall hernia or abnormality. No ascites.  Musculoskeletal: No acute osseous findings. Status post bilateral hip total arthroplasty with associated dense metallic streak artifact, which limits evaluation of the low pelvis.  Please note that definitively benign incidental findings, functional findings, and anatomic variants may have been intentionally omitted from this report in the interest of brevity and clarity. If not specifically noted and recommended in the impression, no further follow-up or characterization is required for incidental findings.  IMPRESSION: 1. Dense aortic valve calcifications in keeping with aortic stenosis. 2. Normal contour and caliber of the thoracic and abdominal aorta with no significant tortuosity of the abdominal aorta and mild tortuosity of the iliac systems. Generally mild mixed calcific atherosclerosis. No evidence of aneurysm, dissection, or other acute aortic pathology. Standard branching pattern of the abdominal aorta. 3. Please see separately provided cardiology report for detailed measurements and parameters relevant to TAVR procedure. 4. Pulmonary edema with moderate bilateral pleural effusions and associated atelectasis or consolidation. 5. Cardiomegaly and coronary artery disease. 6.  Enlargement of the main pulmonary artery, as can be seen in pulmonary hypertension.  Aortic Atherosclerosis (ICD10-I70.0).  Electronically Signed: By: Marolyn JONETTA Jaksch M.D. On: 05/19/2023 09:56          Laboratory Data:  High Sensitivity Troponin:   Recent Labs  Lab 05/15/23 1604 05/15/23 1816  TROPONINIHS 34* 32*     Chemistry Recent Labs  Lab 05/17/23 0250 05/17/23 2009 05/18/23 1126 05/18/23 1128 05/19/23 0246  NA 141 141 133* 141 142  K 3.6 4.2 3.6 3.9 4.0  CL 104 101  --   --  101  CO2 28 29  --   --  29  GLUCOSE 165* 128*  --   --  124*  BUN 13 15  --   --  11  CREATININE 1.00 1.29*  --   --  0.86  CALCIUM  8.5* 8.6*  --   --  8.9  GFRNONAA >60 >60  --   --  >60  ANIONGAP 9 11  --   --  12    Recent Labs  Lab 05/16/23 0455 05/17/23 0250 05/19/23 1022  PROT 5.9* 6.0* 6.3*  ALBUMIN  3.2* 3.2*  --   AST 28 23  --   ALT 60* 47*  --   ALKPHOS 95 93  --   BILITOT 1.2 0.8  --    Hematology Recent Labs  Lab 05/15/23 1604 05/16/23 0455 05/17/23 0250 05/18/23 1126 05/18/23 1128  WBC 8.7 7.7 8.7  --   --   RBC 5.54 5.12 5.18  --   --   HGB 16.7 15.4 15.8 16.3 16.3  HCT 51.3 47.3 48.8 48.0 48.0  MCV 92.6 92.4 94.2  --   --   MCH 30.1 30.1 30.5  --   --   MCHC 32.6 32.6 32.4  --   --   RDW 13.9 13.6 13.6  --   --   PLT 169 157 172  --   --    BNP Recent Labs  Lab 05/15/23 1604  BNP 2,270.9*    DDimer No results for input(s): DDIMER in the last 168 hours.   Radiology/Studies:   .Pre Surgical Assessment: 5 M Walk Test  55M=16.50ft  5 Meter Walk Test- trial 1: 7 seconds 5 Meter Walk Test- trial 2: 7.2 seconds 5 Meter Walk Test- trial 3: 7.3 seconds 5 Meter Walk Test Average: 7.2 seconds ________________________  Procedure Type: Isolated AVR Perioperative Outcome Estimate % Operative Mortality 3.61% Morbidity & Mortality 16.2% Stroke 1.03% Renal Failure 1.86% Reoperation 5.69% Prolonged Ventilation 10.1% Deep Sternal Wound  Infection 0.068% Long Hospital Stay (>14 days) 6.77% Short Hospital Stay (<6 days)* 34.6% __________________________  [place] Cardiomyopathy Questionnaire     05/19/2023    5:19 PM  KCCQ-12  1 a. Ability to shower/bathe Extremely limited  1 b. Ability to walk 1 block Extremely limited  1 c. Ability to hurry/jog Extremely limited  2. Edema feet/ankles/legs 1-2 times a week  3. Limited by fatigue All of the time  4. Limited by dyspnea All of the time  5. Sitting up / on 3+ pillows Every night  6. Limited enjoyment of life Extremely limited  8 a. Participation in hobbies Severely limited  8 b. Participation in chores Severely limited  8 c. Visiting family/friends Severely limited      Assessment and Plan:   LEWI DROST is a 63 y.o. male with symptoms of critical aortic stenosis with NYHA Class IV symptoms currently admitted for acute decompensated heart failure with severely reduced LV function. He is generating a gradient of 50mm hg even with an EF of 30%.   Echo 05/18/23 showed EF 30-35%, mild LVH, global hypokinesis, moderate L/R pleural effusions, bicuspid AoV with mild AI and critical AS with a mean gradient of 50 mmg hg, peak gradient 80 mm hg, AVA 1.12cm2, DVI 0.23.   [facility] 05/18/23 showed mild non obstructive CAD and patent mLAD and pRCA stents with ~10% ISR as well as severely elevated left heart, right heart, and pulmonary artery pressures and fick cardiac output/index. He was started back on IV lasix  (had just been transitioned back to PO) and Coreg  held in the setting of severely reduced cardiac output and decompensated heart failure.   Pre TAVR CTs completed 05/19/23. Cardiac gated CTA of the heart revealed a bicuspid AoV with R-L fusion and heavily calcified raphe AoV calcium  score 5234 with anatomy suitable for a 29 mm Edwards S3UR. CTA of the aorta and iliac vessels demonstrated what appears to be adequate pelvic vascular access to facilitate a transfemoral approach.  CTA also noted pulmonary edema and moderate bilateral pleural effusions. He underwent successful US  guided right thoracentesis with removal of 1.3 L of redish fluid. Fluid sent for cytology. Also pt noted to have frequent ventricular ectopy with a 13 beat run of NSVT and nocturnal CHB. All GDMT and BP meds held currently.   I have reviewed the natural history of aortic stenosis with the patient. We have discussed the limitations of medical therapy and the poor prognosis associated with symptomatic aortic stenosis. We have reviewed potential treatment options, including palliative medical therapy, conventional surgical aortic valve replacement, and transcatheter aortic valve replacement. We discussed treatment options in the context of this patient's specific comorbid medical conditions.    The patient's predicted risk of mortality with conventional aortic valve replacement is 3.61% primarily based on age, severe LV dysfunction, acute CHF, CHB & NSVT on tele, obesity, CAD. With his severely calcified bicuspid aortic valve and young  age he would be best served by SAVR. He presented with biventricular heart failure but seems improved with diuresis. I think his long term prognosis will be better with SAVR using a bioprosthetic valve. I discussed the operative procedure with the patient including alternatives, benefits and risks; including but not limited to bleeding, blood transfusion, infection, stroke, myocardial infarction, graft failure, heart block requiring a permanent pacemaker, organ dysfunction, and death.  Alm SAUNDERS [place] understands and agrees to proceed.  We will schedule surgery for Monday.  Dorise LOIS Fellers, MD

## 2023-05-20 ENCOUNTER — Other Ambulatory Visit (HOSPITAL_COMMUNITY): Payer: Medicaid Other

## 2023-05-20 ENCOUNTER — Inpatient Hospital Stay (HOSPITAL_COMMUNITY): Payer: Medicaid Other

## 2023-05-20 DIAGNOSIS — I5033 Acute on chronic diastolic (congestive) heart failure: Secondary | ICD-10-CM | POA: Diagnosis not present

## 2023-05-20 DIAGNOSIS — Z01810 Encounter for preprocedural cardiovascular examination: Secondary | ICD-10-CM | POA: Diagnosis not present

## 2023-05-20 LAB — BASIC METABOLIC PANEL
Anion gap: 12 (ref 5–15)
BUN: 14 mg/dL (ref 8–23)
CO2: 28 mmol/L (ref 22–32)
Calcium: 8.8 mg/dL — ABNORMAL LOW (ref 8.9–10.3)
Chloride: 98 mmol/L (ref 98–111)
Creatinine, Ser: 0.99 mg/dL (ref 0.61–1.24)
GFR, Estimated: >60 mL/min (ref >60)
Glucose, Bld: 183 mg/dL — ABNORMAL HIGH (ref 70–99)
Potassium: 3.6 mmol/L (ref 3.5–5.1)
Sodium: 138 mmol/L (ref 135–145)

## 2023-05-20 LAB — PATHOLOGIST SMEAR REVIEW

## 2023-05-20 LAB — LIPOPROTEIN A (LPA): Lipoprotein (a): 52.3 nmol/L — ABNORMAL HIGH (ref <75.0)

## 2023-05-20 MED ORDER — POTASSIUM CHLORIDE CRYS ER 20 MEQ PO TBCR
20.0000 meq | EXTENDED_RELEASE_TABLET | Freq: Once | ORAL | Status: AC
  Administered 2023-05-20: 20 meq via ORAL
  Filled 2023-05-20: qty 1

## 2023-05-20 MED ORDER — ENOXAPARIN SODIUM 40 MG/0.4ML IJ SOSY
40.0000 mg | PREFILLED_SYRINGE | INTRAMUSCULAR | Status: DC
  Administered 2023-05-20 – 2023-05-24 (×5): 40 mg via SUBCUTANEOUS
  Filled 2023-05-20 (×5): qty 0.4

## 2023-05-20 MED ORDER — POTASSIUM CHLORIDE CRYS ER 20 MEQ PO TBCR
40.0000 meq | EXTENDED_RELEASE_TABLET | Freq: Once | ORAL | Status: AC
  Administered 2023-05-20: 40 meq via ORAL
  Filled 2023-05-20: qty 2

## 2023-05-20 NOTE — Plan of Care (Signed)

## 2023-05-20 NOTE — Plan of Care (Signed)
 Pt mobility appropriate

## 2023-05-20 NOTE — Progress Notes (Signed)
 PROGRESS NOTE    Joshua [name]  FMW:[id number] DOB: [birth year] DOA: 05/15/2023 PCP: Vicci Barnie NOVAK, MD  63/M with history of CAD, prior PCI and stenting of LAD and RCA in 2018, recently diagnosed severe aortic stenosis, seen by structural heart team in November, however was asymptomatic from this standpoint and recommended follow-up.  Presented to the ED 1/31 with progressive shortness of breath, orthopnea and edema, symptoms started a week ago, significantly worse the day of admission.  In the ER he was hypoxic to 70%, placed on a nonrebreather and then BiPAP.  Workup noted BNP 2270, troponin 34, chest x-ray with pulmonary vascular congestion and small left pleural effusion. -Right and left heart cath with minimal nonobstructive CAD, elevated filling pressures, depressed cardiac output/index -Thoracentesis on the right, 1.3 L drained  Subjective: -Continues to feel better overall, breathing is improving, off O2 this morning  Assessment and Plan:  Acute systolic and diastolic CHF Bilateral moderate pleural effusions Last echo 10/24 with EF 55%, grade 2 DD, normal RV, severe aortic stenosis -Likely driven by severe aortic stenosis, required BiPAP on admission -Repeat echo with EF down to 30-35%, global hypokinesis, known critical aortic stenosis -Improving with diuresis, 12 L negative, right heart cath with elevated filling pressures, depressed cardiac index, continue IV Lasix  1 more day, Coreg  discontinued, avoid excessive GDMT with severe AS -Underwent right thoracentesis yesterday, 1.3 L drained, fluid appears transudative, follow-up cultures and cytology for completeness -Increase activity  Severe aortic stenosis -Seen by structural heart team Dr. Verlin in 11/24, was asymptomatic at the time -structural heart team now following -Cath with mild nonobstructive CAD -Plan for AVR on Monday by Dr. Lucas  History of CAD -Prior PCI and stenting LAD and RCA in 2018 -Continue  aspirin , held carvedilol  with depressed cardiac output/index  Complete heart block Asymptomatic during sleep, single episode, carvedilol  dose decreased  Hypertension -Decreased carvedilol , diuretics as above, discontinued amlodipine  and lisinopril   Hyperglycemia, borderline diabetes -A1 C6.0,  Daily cannabis use -Counseled  DVT prophylaxis: Restart Lovenox  Code Status: Full code Family Communication: No family at bedside Disposition Plan: Home pending above workup  Consultants:    Procedures:   Antimicrobials:    Objective: Vitals:   05/20/23 0040 05/20/23 0409 05/20/23 0800 05/20/23 1036  BP: 94/79  125/81 103/74  Pulse:  75  81  Resp: 18 18  20   Temp: (!) 97.5 F (36.4 C) 98.7 F (37.1 C) 99 F (37.2 C) 97.6 F (36.4 C)  TempSrc:  Oral Oral Oral  SpO2: 95%   91%  Weight:  101.8 kg    Height:        Intake/Output Summary (Last 24 hours) at 05/20/2023 1041 Last data filed at 05/20/2023 0900 Gross per 24 hour  Intake 600 ml  Output 3425 ml  Net -2825 ml   Filed Weights   05/17/23 0415 05/18/23 0558 05/20/23 0409  Weight: 105.2 kg 104.8 kg 101.8 kg    Examination:  General exam: AAOx3, no distress HEENT: + JVD CVS: S1-S2, regular rhythm, systolic murmur Lungs: Decreased breath sounds on the left Abdomen: Soft, nontender, bowel sounds present Extremities: No edema Skin: No rashes Psychiatry:  Mood & affect appropriate.     Data Reviewed:   CBC: Recent Labs  Lab 05/15/23 1604 05/16/23 0455 05/17/23 0250 05/18/23 1126 05/18/23 1128  WBC 8.7 7.7 8.7  --   --   NEUTROABS 7.4  --   --   --   --   HGB 16.7 15.4 15.8  16.3 16.3  HCT 51.3 47.3 48.8 48.0 48.0  MCV 92.6 92.4 94.2  --   --   PLT 169 157 172  --   --    Basic Metabolic Panel: Recent Labs  Lab 05/15/23 1816 05/16/23 0455 05/17/23 0250 05/17/23 2009 05/18/23 1126 05/18/23 1128 05/19/23 0246 05/20/23 0246  NA  --  142 141 141 133* 141 142 138  K  --  4.0 3.6 4.2 3.6 3.9 4.0  3.6  CL  --  105 104 101  --   --  101 98  CO2  --  27 28 29   --   --  29 28  GLUCOSE  --  121* 165* 128*  --   --  124* 183*  BUN  --  11 13 15   --   --  11 14  CREATININE  --  0.93 1.00 1.29*  --   --  0.86 0.99  CALCIUM   --  8.8* 8.5* 8.6*  --   --  8.9 8.8*  MG 2.2  --   --  2.1  --   --   --   --    GFR: Estimated Creatinine Clearance: 94.6 mL/min (by C-G formula based on SCr of 0.99 mg/dL). Liver Function Tests: Recent Labs  Lab 05/16/23 0455 05/17/23 0250 05/19/23 1022  AST 28 23  --   ALT 60* 47*  --   ALKPHOS 95 93  --   BILITOT 1.2 0.8  --   PROT 5.9* 6.0* 6.3*  ALBUMIN  3.2* 3.2*  --    No results for input(s): LIPASE, AMYLASE in the last 168 hours. No results for input(s): AMMONIA in the last 168 hours. Coagulation Profile: No results for input(s): INR, PROTIME in the last 168 hours. Cardiac Enzymes: No results for input(s): CKTOTAL, CKMB, CKMBINDEX, TROPONINI in the last 168 hours. BNP (last 3 results) No results for input(s): PROBNP in the last 8760 hours. HbA1C: No results for input(s): HGBA1C in the last 72 hours.  CBG: No results for input(s): GLUCAP in the last 168 hours. Lipid Profile: No results for input(s): CHOL, HDL, LDLCALC, TRIG, CHOLHDL, LDLDIRECT in the last 72 hours. Thyroid Function Tests: No results for input(s): TSH, T4TOTAL, FREET4, T3FREE, THYROIDAB in the last 72 hours. Anemia Panel: No results for input(s): VITAMINB12, FOLATE, FERRITIN, TIBC, IRON, RETICCTPCT in the last 72 hours. Urine analysis:    Component Value Date/Time   COLORURINE COLORLESS (A) 11/22/2018 1900   APPEARANCEUR CLEAR 11/22/2018 1900   LABSPEC 1.002 (L) 11/22/2018 1900   PHURINE 6.0 11/22/2018 1900   GLUCOSEU NEGATIVE 11/22/2018 1900   HGBUR NEGATIVE 11/22/2018 1900   BILIRUBINUR NEGATIVE 11/22/2018 1900   KETONESUR NEGATIVE 11/22/2018 1900   PROTEINUR NEGATIVE 11/22/2018 1900   NITRITE NEGATIVE  11/22/2018 1900   LEUKOCYTESUR NEGATIVE 11/22/2018 1900   Sepsis Labs: @LABRCNTIP (procalcitonin:4,lacticidven:4)  ) Recent Results (from the past 240 hours)  Resp panel by RT-PCR (RSV, Flu A&B, Covid) Anterior Nasal Swab     Status: None   Collection Time: 05/15/23  2:43 PM   Specimen: Anterior Nasal Swab  Result Value Ref Range Status   SARS Coronavirus 2 by RT PCR NEGATIVE NEGATIVE Final   Influenza A by PCR NEGATIVE NEGATIVE Final   Influenza B by PCR NEGATIVE NEGATIVE Final    Comment: (NOTE) The Xpert Xpress SARS-CoV-2/FLU/RSV plus assay is intended as an aid in the diagnosis of influenza from Nasopharyngeal swab specimens and should not be used as a sole basis  for treatment. Nasal washings and aspirates are unacceptable for Xpert Xpress SARS-CoV-2/FLU/RSV testing.  Fact Sheet for Patients: bloggercourse.com  Fact Sheet for Healthcare Providers: seriousbroker.it  This test is not yet approved or cleared by the [place]  FDA and has been authorized for detection and/or diagnosis of SARS-CoV-2 by FDA under an Emergency Use Authorization (EUA). This EUA will remain in effect (meaning this test can be used) for the duration of the COVID-19 declaration under Section 564(b)(1) of the Act, 21 U.S.C. section 360bbb-3(b)(1), unless the authorization is terminated or revoked.     Resp Syncytial Virus by PCR NEGATIVE NEGATIVE Final    Comment: (NOTE) Fact Sheet for Patients: bloggercourse.com  Fact Sheet for Healthcare Providers: seriousbroker.it  This test is not yet approved or cleared by the [place]  FDA and has been authorized for detection and/or diagnosis of SARS-CoV-2 by FDA under an Emergency Use Authorization (EUA). This EUA will remain in effect (meaning this test can be used) for the duration of the COVID-19 declaration under Section 564(b)(1) of the Act, 21  U.S.C. section 360bbb-3(b)(1), unless the authorization is terminated or revoked.  Performed at [facility], [address], [place], [place] [address]   Body fluid culture w Gram Stain     Status: None (Preliminary result)   Collection Time: 05/19/23  2:10 PM   Specimen: Lung, Right; Pleural Fluid  Result Value Ref Range Status   Specimen Description PLEURAL  Final   Special Requests right lung  Final   Gram Stain   Final    RARE WBC PRESENT,BOTH PMN AND MONONUCLEAR NO ORGANISMS SEEN    Culture   Final    NO GROWTH < 24 HOURS Performed at [facility], [address], [place], [place] [address]    Report Status PENDING  Incomplete     Radiology Studies: IR THORACENTESIS ASP PLEURAL SPACE W/IMG GUIDE Result Date: 05/19/2023 INDICATION: Congestive heart failure, hypoxia and bilateral pleural effusions. Request for diagnostic and therapeutic thoracentesis. EXAM: ULTRASOUND GUIDED RIGHT THORACENTESIS MEDICATIONS: 1% lidocaine  10 mL COMPLICATIONS: None immediate. PROCEDURE: An ultrasound guided thoracentesis was thoroughly discussed with the patient and questions answered. The benefits, risks, alternatives and complications were also discussed. The patient understands and wishes to proceed with the procedure. Written consent was obtained. Ultrasound was performed to localize and mark an adequate pocket of fluid in the right chest. The area was then prepped and draped in the normal sterile fashion. 1% Lidocaine  was used for local anesthesia. Under ultrasound guidance a 6 Fr Safe-T-Centesis catheter was introduced. Thoracentesis was performed. The catheter was removed and a dressing applied. FINDINGS: A total of approximately 1.3 L of reddish colored fluid was removed. Samples were sent to the laboratory as requested by the clinical team. IMPRESSION: Successful ultrasound guided right thoracentesis yielding 1.3 L of pleural fluid. No pneumothorax on post-procedure chest x-ray.  Procedure performed by: Sari Lamp, PA-C Electronically Signed   By: Ester Sides M.D.   On: 05/19/2023 16:01   DG Chest 1 View Result Date: 05/19/2023 CLINICAL DATA:  Pleural effusion.  Status post right thoracentesis. EXAM: CHEST  1 VIEW COMPARISON:  Chest radiograph dated 05/15/2023. FINDINGS: Small left pleural effusion and left lung base atelectasis. Overall significant improvement in aeration of the lungs compared to prior radiograph. No significant pleural effusion on the right. There is no pneumothorax. Stable cardiac silhouette. Atherosclerotic calcification of the aorta. No acute osseous pathology. IMPRESSION: Significant improvement in aeration of the lungs.  No pneumothorax.  Electronically Signed   By: Vanetta Chou M.D.   On: 05/19/2023 15:22   CT ANGIO CHEST AORTA W/CM & OR WO/CM Addendum Date: 05/19/2023 ADDENDUM REPORT: 05/19/2023 11:50 CLINICAL DATA:  Aortic Valve pathology with assessment for TAVR EXAM: Cardiac TAVR CT TECHNIQUE: The patient was scanned on a Siemens Force 192 slice scanner. A 120 kV retrospective scan was triggered in the descending thoracic aorta at 111 HU's. Gantry rotation speed was 270 msecs and collimation was .9 mm. No beta blockade or nitro were given. The 3D data set was reconstructed in 5% intervals of the R-R cycle. Systolic and diastolic phases were analyzed on a dedicated work station using MPR, MIP and VRT modes. The patient received 95 cc of contrast. FINDINGS: Aortic Valve: Severely thickened aortic valve with heavy calcification and reduced excursion the planimeter valve area is 0.979 Sq cm consistent with severe aortic stenosis Anatomy: Three sinuses with R-L fusion and heavily calcified raphe; bicuspid valve Annular calcification: Severe- presence of LVOT Calcification Aortic Valve Calcium  Score: 5234 Presence of basal septal hypertrophy: 15 mm; systolic annular dimensions greater that diastolic and reported below Perimembranous septal diameter: 5 mm  Mitral Valve: No calcifications Aortic Annulus Measurements- 20% Phase Major annulus diameter: 33 mm Minor annulus diameter:25 mm Annular perimeter: 92 mm Annular area: 6.41 cm2 Aortic Measurements- 70% phase Sinotubular Junction: 32 mm Ascending Thoracic Aorta: 35 mm Aortic Arch: 28 mm Descending Thoracic Aorta: 27 mm Aortic atherosclerosis. Sinus of Valsalva Measurements: Right coronary cusp width: 35 mm Left coronary cusp width: 39 mm Non coronary cusp width: 39 mm Coronary Artery Height above Annulus: Left Main: 15 mm Left SoV height: 25 mm Right Coronary: 21 mm Right SoV height: 25 mm Optimum Fluoroscopic Angle for Delivery: LAO 4, CAU 4 Cusp overlay view angle: RAO 0, CAU 9 Valves for structural team consideration: Sapien 29 mm (scene saved) Evolut 34 mm; sinus asymmetry noted Non TAVR Valve Findings: Coronary Arteries: Normal coronary origin. Study not completed with nitroglycerin . Coronary Calcium  Score deferred in the setting of prior percutaneous intervention. Systemic veins: Normal anatomy Main Pulmonary artery: Severe dilation, 38 mm Pulmonary veins: Two right middle veins with a short common ostium, normal variant Left atrial appendage: Patent Interatrial septum: No communications Chamber dimensions: Dilated left ventricle Pericardium: No calcification. Extra Cardiac Findings as per separate reporting. Notable artifacts: None Image quality: Good IMPRESSION: 1. Severe Aortic stenosis. Findings pertinent to TAVR procedure are detailed above, unless candidate for SAVR. RECOMMENDATIONS: The proposed cut-off value of 1,651 AU yielded a 93 % sensitivity and 75 % specificity in grading AS severity in patients with classical low-flow, low-gradient AS. Proposed different cut-off values to define severe AS for men and women as 2,065 AU and 1,274 AU, respectively. The joint European and American recommendations for the assessment of AS consider the aortic valve calcium  score as a continuum - a very high calcium   score suggests severe AS and a low calcium  score suggests severe AS is unlikely. Donney VEAR Jarome LULLA Stephen RENETTE, et al. 2017 ESC/EACTS Guidelines for the management of valvular heart disease. Eur Heart J [phone] Coronary artery calcium  (CAC) score is a strong predictor of incident coronary heart disease (CHD) and provides predictive information beyond traditional risk factors. CAC scoring is reasonable to use in the decision to withhold, postpone, or initiate statin therapy in intermediate-risk or selected borderline-risk asymptomatic adults (age 62-75 years and LDL-C >=70 to <190 mg/dL) who do not have diabetes or established atherosclerotic cardiovascular disease (ASCVD).* In intermediate-risk (10-year  ASCVD risk >=7.5% to <20%) adults or selected borderline-risk (10-year ASCVD risk >=5% to <7.5%) adults in whom a CAC score is measured for the purpose of making a treatment decision the following recommendations have been made: If CAC = 0, it is reasonable to withhold statin therapy and reassess in 5 to 10 years, as long as higher risk conditions are absent (diabetes mellitus, family history of premature CHD in first degree relatives (males <55 years; females <65 years), cigarette smoking, LDL >=190 mg/dL or other independent risk factors). If CAC is 1 to 99, it is reasonable to initiate statin therapy for patients >=50 years of age. If CAC is >=100 or >=75th percentile, it is reasonable to initiate statin therapy at any age. Cardiology referral should be considered for patients with CAC scores >=400 or >=75th percentile. *2018 AHA/ACC/AACVPR/AAPA/ABC/ACPM/ADA/AGS/APhA/ASPC/NLA/PCNA Guideline on the Management of Blood Cholesterol: A Report of the American College of Cardiology/American Heart Association Task Force on Clinical Practice Guidelines. J Am Coll Cardiol. 2019;73(24):3168-3209. Mahesh  Chandrasekhar Electronically Signed   By: Stanly Leavens M.D.   On: 05/19/2023 11:50   Addendum Date:  05/19/2023 ADDENDUM REPORT: 05/19/2023 10:29 ADDENDUM: CLINICAL DATA: Preoperative, TAVR radiology over-read EXAM: CTA CHEST ABDOMEN AND PELVIS WITHOUT AND WITH CONTRAST TECHNIQUE: Multidetector CT imaging of the chest, abdomen and pelvis was performed using the standard protocol during bolus administration of intravenous contrast. Multiplanar reconstructed images and MIPs were obtained and reviewed to evaluate the vascular anatomy. RADIATION DOSE REDUCTION: This exam was performed according to the departmental dose-optimization program which includes automated exposure control, adjustment of the mA and/or kV according to patient size and/or use of iterative reconstruction technique. CONTRAST: 95mL OMNIPAQUE  IOHEXOL  350 MG/ML SOLN COMPARISON: CT chest, 11/17/2022 FINDINGS: CTA CHEST FINDINGS VASCULAR Aorta: Satisfactory opacification of the aorta dense aortic valve calcifications. Normal contour and caliber of the thoracic aorta. No evidence of aneurysm, dissection, or other acute aortic pathology. Mild thoracic aortic atherosclerosis. Cardiovascular: No evidence of obvious pulmonary embolism on limited non-tailored examination with no meaningful opacification of the pulmonary arteries. Enlargement of the main pulmonary artery measuring up to 3.8 cm in caliber. Cardiomegaly. Three-vessel coronary artery calcifications and or stents. No pericardial effusion. Review of the MIP images confirms the above findings. NON VASCULAR Mediastinum/Nodes: No enlarged mediastinal, hilar, or axillary lymph nodes. Thyroid gland, trachea, and esophagus demonstrate no significant findings. Lungs/Pleura: Moderate bilateral pleural effusions and associated atelectasis or consolidation. Interlobular septal thickening and mosaic ground-glass airspace attenuation throughout the lungs. Musculoskeletal: No chest wall abnormality. No acute osseous findings. Review of the MIP images confirms the above findings. CTA ABDOMEN AND PELVIS FINDINGS  VASCULAR Normal contour and caliber of the abdominal aorta with no significant tortuosity of the abdominal aorta and mild tortuosity of the iliac systems. Mild mixed calcific atherosclerosis. No evidence of aneurysm, dissection, or other acute aortic pathology. Standard branching pattern of the abdominal aorta with solitary bilateral renal arteries. Review of the MIP images confirms the above findings. NON-VASCULAR Hepatobiliary: No solid liver abnormality is seen. No gallstones, gallbladder wall thickening, or biliary dilatation. Pancreas: Unremarkable. No pancreatic ductal dilatation or surrounding inflammatory changes. Spleen: Normal in size without significant abnormality. Adrenals/Urinary Tract: Unchanged, definitively benign macroscopic fat containing left adrenal adenoma, for which no further follow-up or characterization is required. Kidneys are normal, without renal calculi, solid lesion, or hydronephrosis. Bladder is unremarkable. Stomach/Bowel: Stomach is within normal limits. Appendix appears normal. No evidence of bowel wall thickening, distention, or inflammatory changes. Occasional sigmoid diverticula. Lymphatic: No enlarged  abdominal or pelvic lymph nodes. Reproductive: No obvious abnormality. Evaluation of the low pelvis very limited by dense metallic streak artifact. Other: No abdominal wall hernia or abnormality. No ascites. Musculoskeletal: No acute osseous findings. Status post bilateral hip total arthroplasty with associated dense metallic streak artifact, which limits evaluation of the low pelvis. Please note that definitively benign incidental findings, functional findings, and anatomic variants may have been intentionally omitted from this report in the interest of brevity and clarity. If not specifically noted and recommended in the impression, no further follow-up or characterization is required for incidental findings. IMPRESSION: 1. Dense aortic valve calcifications in keeping with  aortic stenosis. 2. Normal contour and caliber of the thoracic and abdominal aorta with no significant tortuosity of the abdominal aorta and mild tortuosity of the iliac systems. Generally mild mixed calcific atherosclerosis. No evidence of aneurysm, dissection, or other acute aortic pathology. Standard branching pattern of the abdominal aorta. 3. Please see separately provided cardiology report for detailed measurements and parameters relevant to TAVR procedure. 4. Pulmonary edema with moderate bilateral pleural effusions and associated atelectasis or consolidation. 5. Cardiomegaly and coronary artery disease. 6. Enlargement of the main pulmonary artery, as can be seen in pulmonary hypertension. Aortic Atherosclerosis (ICD10-I70.0). Electronically Signed By: Marolyn JONETTA Jaksch M.D. On: 05/19/2023 09:56 Electronically Signed   By: Marolyn JONETTA Jaksch M.D.   On: 05/19/2023 10:29   Result Date: 05/19/2023 CLINICAL DATA:  Preoperative, TAVR radiology over-read EXAM: CTA CHEST ABDOMEN AND PELVIS WITHOUT AND WITH CONTRAST TECHNIQUE: Multidetector CT imaging of the chest, abdomen and pelvis was performed using the standard protocol during bolus administration of intravenous contrast. Multiplanar reconstructed images and MIPs were obtained and reviewed to evaluate the vascular anatomy. RADIATION DOSE REDUCTION: This exam was performed according to the departmental dose-optimization program which includes automated exposure control, adjustment of the mA and/or kV according to patient size and/or use of iterative reconstruction technique. CONTRAST:  95mL OMNIPAQUE  IOHEXOL  350 MG/ML SOLN COMPARISON:  CT chest, 11/17/2022 FINDINGS: CTA CHEST FINDINGS VASCULAR Aorta: Satisfactory opacification of the aorta dense aortic valve calcifications. Normal contour and caliber of the thoracic aorta. No evidence of aneurysm, dissection, or other acute aortic pathology. Mild thoracic aortic atherosclerosis. Cardiovascular: No evidence of obvious  pulmonary embolism on limited non-tailored examination with no meaningful opacification of the pulmonary arteries. Enlargement of the main pulmonary artery measuring up to 3.8 cm in caliber. Cardiomegaly. Three-vessel coronary artery calcifications and or stents. No pericardial effusion. Review of the MIP images confirms the above findings. NON VASCULAR Mediastinum/Nodes: No enlarged mediastinal, hilar, or axillary lymph nodes. Thyroid gland, trachea, and esophagus demonstrate no significant findings. Lungs/Pleura: Moderate bilateral pleural effusions and associated atelectasis or consolidation. Interlobular septal thickening and mosaic ground-glass airspace attenuation throughout the lungs. Musculoskeletal: No chest wall abnormality. No acute osseous findings. Review of the MIP images confirms the above findings. CTA ABDOMEN AND PELVIS FINDINGS VASCULAR Normal contour and caliber of the abdominal aorta with no significant tortuosity of the abdominal aorta and mild tortuosity of the iliac systems. Mild mixed calcific atherosclerosis. No evidence of aneurysm, dissection, or other acute aortic pathology. Standard branching pattern of the abdominal aorta with solitary bilateral renal arteries. Review of the MIP images confirms the above findings. NON-VASCULAR Hepatobiliary: No solid liver abnormality is seen. No gallstones, gallbladder wall thickening, or biliary dilatation. Pancreas: Unremarkable. No pancreatic ductal dilatation or surrounding inflammatory changes. Spleen: Normal in size without significant abnormality. Adrenals/Urinary Tract: Unchanged, definitively benign macroscopic fat containing left adrenal adenoma, for which  no further follow-up or characterization is required. Kidneys are normal, without renal calculi, solid lesion, or hydronephrosis. Bladder is unremarkable. Stomach/Bowel: Stomach is within normal limits. Appendix appears normal. No evidence of bowel wall thickening, distention, or  inflammatory changes. Occasional sigmoid diverticula. Lymphatic: No enlarged abdominal or pelvic lymph nodes. Reproductive: No obvious abnormality. Evaluation of the low pelvis very limited by dense metallic streak artifact. Other: No abdominal wall hernia or abnormality. No ascites. Musculoskeletal: No acute osseous findings. Status post bilateral hip total arthroplasty with associated dense metallic streak artifact, which limits evaluation of the low pelvis. Please note that definitively benign incidental findings, functional findings, and anatomic variants may have been intentionally omitted from this report in the interest of brevity and clarity. If not specifically noted and recommended in the impression, no further follow-up or characterization is required for incidental findings. IMPRESSION: 1. Dense aortic valve calcifications in keeping with aortic stenosis. 2. Normal contour and caliber of the thoracic and abdominal aorta with no significant tortuosity of the abdominal aorta and mild tortuosity of the iliac systems. Generally mild mixed calcific atherosclerosis. No evidence of aneurysm, dissection, or other acute aortic pathology. Standard branching pattern of the abdominal aorta. 3. Please see separately provided cardiology report for detailed measurements and parameters relevant to TAVR procedure. 4. Pulmonary edema with moderate bilateral pleural effusions and associated atelectasis or consolidation. 5. Cardiomegaly and coronary artery disease. 6. Enlargement of the main pulmonary artery, as can be seen in pulmonary hypertension. Aortic Atherosclerosis (ICD10-I70.0). Electronically Signed: By: Marolyn JONETTA Jaksch M.D. On: 05/19/2023 09:56   CT Angio Abd/Pel w/ and/or w/o Addendum Date: 05/19/2023 ADDENDUM REPORT: 05/19/2023 11:50 CLINICAL DATA:  Aortic Valve pathology with assessment for TAVR EXAM: Cardiac TAVR CT TECHNIQUE: The patient was scanned on a Siemens Force 192 slice scanner. A 120 kV  retrospective scan was triggered in the descending thoracic aorta at 111 HU's. Gantry rotation speed was 270 msecs and collimation was .9 mm. No beta blockade or nitro were given. The 3D data set was reconstructed in 5% intervals of the R-R cycle. Systolic and diastolic phases were analyzed on a dedicated work station using MPR, MIP and VRT modes. The patient received 95 cc of contrast. FINDINGS: Aortic Valve: Severely thickened aortic valve with heavy calcification and reduced excursion the planimeter valve area is 0.979 Sq cm consistent with severe aortic stenosis Anatomy: Three sinuses with R-L fusion and heavily calcified raphe; bicuspid valve Annular calcification: Severe- presence of LVOT Calcification Aortic Valve Calcium  Score: 5234 Presence of basal septal hypertrophy: 15 mm; systolic annular dimensions greater that diastolic and reported below Perimembranous septal diameter: 5 mm Mitral Valve: No calcifications Aortic Annulus Measurements- 20% Phase Major annulus diameter: 33 mm Minor annulus diameter:25 mm Annular perimeter: 92 mm Annular area: 6.41 cm2 Aortic Measurements- 70% phase Sinotubular Junction: 32 mm Ascending Thoracic Aorta: 35 mm Aortic Arch: 28 mm Descending Thoracic Aorta: 27 mm Aortic atherosclerosis. Sinus of Valsalva Measurements: Right coronary cusp width: 35 mm Left coronary cusp width: 39 mm Non coronary cusp width: 39 mm Coronary Artery Height above Annulus: Left Main: 15 mm Left SoV height: 25 mm Right Coronary: 21 mm Right SoV height: 25 mm Optimum Fluoroscopic Angle for Delivery: LAO 4, CAU 4 Cusp overlay view angle: RAO 0, CAU 9 Valves for structural team consideration: Sapien 29 mm (scene saved) Evolut 34 mm; sinus asymmetry noted Non TAVR Valve Findings: Coronary Arteries: Normal coronary origin. Study not completed with nitroglycerin . Coronary Calcium  Score deferred in the setting of  prior percutaneous intervention. Systemic veins: Normal anatomy Main Pulmonary artery: Severe  dilation, 38 mm Pulmonary veins: Two right middle veins with a short common ostium, normal variant Left atrial appendage: Patent Interatrial septum: No communications Chamber dimensions: Dilated left ventricle Pericardium: No calcification. Extra Cardiac Findings as per separate reporting. Notable artifacts: None Image quality: Good IMPRESSION: 1. Severe Aortic stenosis. Findings pertinent to TAVR procedure are detailed above, unless candidate for SAVR. RECOMMENDATIONS: The proposed cut-off value of 1,651 AU yielded a 93 % sensitivity and 75 % specificity in grading AS severity in patients with classical low-flow, low-gradient AS. Proposed different cut-off values to define severe AS for men and women as 2,065 AU and 1,274 AU, respectively. The joint European and American recommendations for the assessment of AS consider the aortic valve calcium  score as a continuum - a very high calcium  score suggests severe AS and a low calcium  score suggests severe AS is unlikely. Donney VEAR Jarome LULLA Stephen RENETTE, et al. 2017 ESC/EACTS Guidelines for the management of valvular heart disease. Eur Heart J [phone] Coronary artery calcium  (CAC) score is a strong predictor of incident coronary heart disease (CHD) and provides predictive information beyond traditional risk factors. CAC scoring is reasonable to use in the decision to withhold, postpone, or initiate statin therapy in intermediate-risk or selected borderline-risk asymptomatic adults (age 13-75 years and LDL-C >=70 to <190 mg/dL) who do not have diabetes or established atherosclerotic cardiovascular disease (ASCVD).* In intermediate-risk (10-year ASCVD risk >=7.5% to <20%) adults or selected borderline-risk (10-year ASCVD risk >=5% to <7.5%) adults in whom a CAC score is measured for the purpose of making a treatment decision the following recommendations have been made: If CAC = 0, it is reasonable to withhold statin therapy and reassess in 5 to 10 years, as long as  higher risk conditions are absent (diabetes mellitus, family history of premature CHD in first degree relatives (males <55 years; females <65 years), cigarette smoking, LDL >=190 mg/dL or other independent risk factors). If CAC is 1 to 99, it is reasonable to initiate statin therapy for patients >=93 years of age. If CAC is >=100 or >=75th percentile, it is reasonable to initiate statin therapy at any age. Cardiology referral should be considered for patients with CAC scores >=400 or >=75th percentile. *2018 AHA/ACC/AACVPR/AAPA/ABC/ACPM/ADA/AGS/APhA/ASPC/NLA/PCNA Guideline on the Management of Blood Cholesterol: A Report of the American College of Cardiology/American Heart Association Task Force on Clinical Practice Guidelines. J Am Coll Cardiol. 2019;73(24):3168-3209. Mahesh  Chandrasekhar Electronically Signed   By: Stanly Leavens M.D.   On: 05/19/2023 11:50   Addendum Date: 05/19/2023 ADDENDUM REPORT: 05/19/2023 10:29 ADDENDUM: CLINICAL DATA: Preoperative, TAVR radiology over-read EXAM: CTA CHEST ABDOMEN AND PELVIS WITHOUT AND WITH CONTRAST TECHNIQUE: Multidetector CT imaging of the chest, abdomen and pelvis was performed using the standard protocol during bolus administration of intravenous contrast. Multiplanar reconstructed images and MIPs were obtained and reviewed to evaluate the vascular anatomy. RADIATION DOSE REDUCTION: This exam was performed according to the departmental dose-optimization program which includes automated exposure control, adjustment of the mA and/or kV according to patient size and/or use of iterative reconstruction technique. CONTRAST: 95mL OMNIPAQUE  IOHEXOL  350 MG/ML SOLN COMPARISON: CT chest, 11/17/2022 FINDINGS: CTA CHEST FINDINGS VASCULAR Aorta: Satisfactory opacification of the aorta dense aortic valve calcifications. Normal contour and caliber of the thoracic aorta. No evidence of aneurysm, dissection, or other acute aortic pathology. Mild thoracic aortic  atherosclerosis. Cardiovascular: No evidence of obvious pulmonary embolism on limited non-tailored examination with no meaningful opacification  of the pulmonary arteries. Enlargement of the main pulmonary artery measuring up to 3.8 cm in caliber. Cardiomegaly. Three-vessel coronary artery calcifications and or stents. No pericardial effusion. Review of the MIP images confirms the above findings. NON VASCULAR Mediastinum/Nodes: No enlarged mediastinal, hilar, or axillary lymph nodes. Thyroid gland, trachea, and esophagus demonstrate no significant findings. Lungs/Pleura: Moderate bilateral pleural effusions and associated atelectasis or consolidation. Interlobular septal thickening and mosaic ground-glass airspace attenuation throughout the lungs. Musculoskeletal: No chest wall abnormality. No acute osseous findings. Review of the MIP images confirms the above findings. CTA ABDOMEN AND PELVIS FINDINGS VASCULAR Normal contour and caliber of the abdominal aorta with no significant tortuosity of the abdominal aorta and mild tortuosity of the iliac systems. Mild mixed calcific atherosclerosis. No evidence of aneurysm, dissection, or other acute aortic pathology. Standard branching pattern of the abdominal aorta with solitary bilateral renal arteries. Review of the MIP images confirms the above findings. NON-VASCULAR Hepatobiliary: No solid liver abnormality is seen. No gallstones, gallbladder wall thickening, or biliary dilatation. Pancreas: Unremarkable. No pancreatic ductal dilatation or surrounding inflammatory changes. Spleen: Normal in size without significant abnormality. Adrenals/Urinary Tract: Unchanged, definitively benign macroscopic fat containing left adrenal adenoma, for which no further follow-up or characterization is required. Kidneys are normal, without renal calculi, solid lesion, or hydronephrosis. Bladder is unremarkable. Stomach/Bowel: Stomach is within normal limits. Appendix appears normal. No  evidence of bowel wall thickening, distention, or inflammatory changes. Occasional sigmoid diverticula. Lymphatic: No enlarged abdominal or pelvic lymph nodes. Reproductive: No obvious abnormality. Evaluation of the low pelvis very limited by dense metallic streak artifact. Other: No abdominal wall hernia or abnormality. No ascites. Musculoskeletal: No acute osseous findings. Status post bilateral hip total arthroplasty with associated dense metallic streak artifact, which limits evaluation of the low pelvis. Please note that definitively benign incidental findings, functional findings, and anatomic variants may have been intentionally omitted from this report in the interest of brevity and clarity. If not specifically noted and recommended in the impression, no further follow-up or characterization is required for incidental findings. IMPRESSION: 1. Dense aortic valve calcifications in keeping with aortic stenosis. 2. Normal contour and caliber of the thoracic and abdominal aorta with no significant tortuosity of the abdominal aorta and mild tortuosity of the iliac systems. Generally mild mixed calcific atherosclerosis. No evidence of aneurysm, dissection, or other acute aortic pathology. Standard branching pattern of the abdominal aorta. 3. Please see separately provided cardiology report for detailed measurements and parameters relevant to TAVR procedure. 4. Pulmonary edema with moderate bilateral pleural effusions and associated atelectasis or consolidation. 5. Cardiomegaly and coronary artery disease. 6. Enlargement of the main pulmonary artery, as can be seen in pulmonary hypertension. Aortic Atherosclerosis (ICD10-I70.0). Electronically Signed By: Marolyn JONETTA Jaksch M.D. On: 05/19/2023 09:56 Electronically Signed   By: Marolyn JONETTA Jaksch M.D.   On: 05/19/2023 10:29   Result Date: 05/19/2023 CLINICAL DATA:  Preoperative, TAVR radiology over-read EXAM: CTA CHEST ABDOMEN AND PELVIS WITHOUT AND WITH CONTRAST TECHNIQUE:  Multidetector CT imaging of the chest, abdomen and pelvis was performed using the standard protocol during bolus administration of intravenous contrast. Multiplanar reconstructed images and MIPs were obtained and reviewed to evaluate the vascular anatomy. RADIATION DOSE REDUCTION: This exam was performed according to the departmental dose-optimization program which includes automated exposure control, adjustment of the mA and/or kV according to patient size and/or use of iterative reconstruction technique. CONTRAST:  95mL OMNIPAQUE  IOHEXOL  350 MG/ML SOLN COMPARISON:  CT chest, 11/17/2022 FINDINGS: CTA CHEST FINDINGS VASCULAR Aorta: Satisfactory  opacification of the aorta dense aortic valve calcifications. Normal contour and caliber of the thoracic aorta. No evidence of aneurysm, dissection, or other acute aortic pathology. Mild thoracic aortic atherosclerosis. Cardiovascular: No evidence of obvious pulmonary embolism on limited non-tailored examination with no meaningful opacification of the pulmonary arteries. Enlargement of the main pulmonary artery measuring up to 3.8 cm in caliber. Cardiomegaly. Three-vessel coronary artery calcifications and or stents. No pericardial effusion. Review of the MIP images confirms the above findings. NON VASCULAR Mediastinum/Nodes: No enlarged mediastinal, hilar, or axillary lymph nodes. Thyroid gland, trachea, and esophagus demonstrate no significant findings. Lungs/Pleura: Moderate bilateral pleural effusions and associated atelectasis or consolidation. Interlobular septal thickening and mosaic ground-glass airspace attenuation throughout the lungs. Musculoskeletal: No chest wall abnormality. No acute osseous findings. Review of the MIP images confirms the above findings. CTA ABDOMEN AND PELVIS FINDINGS VASCULAR Normal contour and caliber of the abdominal aorta with no significant tortuosity of the abdominal aorta and mild tortuosity of the iliac systems. Mild mixed calcific  atherosclerosis. No evidence of aneurysm, dissection, or other acute aortic pathology. Standard branching pattern of the abdominal aorta with solitary bilateral renal arteries. Review of the MIP images confirms the above findings. NON-VASCULAR Hepatobiliary: No solid liver abnormality is seen. No gallstones, gallbladder wall thickening, or biliary dilatation. Pancreas: Unremarkable. No pancreatic ductal dilatation or surrounding inflammatory changes. Spleen: Normal in size without significant abnormality. Adrenals/Urinary Tract: Unchanged, definitively benign macroscopic fat containing left adrenal adenoma, for which no further follow-up or characterization is required. Kidneys are normal, without renal calculi, solid lesion, or hydronephrosis. Bladder is unremarkable. Stomach/Bowel: Stomach is within normal limits. Appendix appears normal. No evidence of bowel wall thickening, distention, or inflammatory changes. Occasional sigmoid diverticula. Lymphatic: No enlarged abdominal or pelvic lymph nodes. Reproductive: No obvious abnormality. Evaluation of the low pelvis very limited by dense metallic streak artifact. Other: No abdominal wall hernia or abnormality. No ascites. Musculoskeletal: No acute osseous findings. Status post bilateral hip total arthroplasty with associated dense metallic streak artifact, which limits evaluation of the low pelvis. Please note that definitively benign incidental findings, functional findings, and anatomic variants may have been intentionally omitted from this report in the interest of brevity and clarity. If not specifically noted and recommended in the impression, no further follow-up or characterization is required for incidental findings. IMPRESSION: 1. Dense aortic valve calcifications in keeping with aortic stenosis. 2. Normal contour and caliber of the thoracic and abdominal aorta with no significant tortuosity of the abdominal aorta and mild tortuosity of the iliac systems.  Generally mild mixed calcific atherosclerosis. No evidence of aneurysm, dissection, or other acute aortic pathology. Standard branching pattern of the abdominal aorta. 3. Please see separately provided cardiology report for detailed measurements and parameters relevant to TAVR procedure. 4. Pulmonary edema with moderate bilateral pleural effusions and associated atelectasis or consolidation. 5. Cardiomegaly and coronary artery disease. 6. Enlargement of the main pulmonary artery, as can be seen in pulmonary hypertension. Aortic Atherosclerosis (ICD10-I70.0). Electronically Signed: By: Marolyn JONETTA Jaksch M.D. On: 05/19/2023 09:56   CT CORONARY MORPH W/CTA COR W/SCORE W/CA W/CM &/OR WO/CM Addendum Date: 05/19/2023 ADDENDUM REPORT: 05/19/2023 11:50 CLINICAL DATA:  Aortic Valve pathology with assessment for TAVR EXAM: Cardiac TAVR CT TECHNIQUE: The patient was scanned on a Siemens Force 192 slice scanner. A 120 kV retrospective scan was triggered in the descending thoracic aorta at 111 HU's. Gantry rotation speed was 270 msecs and collimation was .9 mm. No beta blockade or nitro were given. The 3D data  set was reconstructed in 5% intervals of the R-R cycle. Systolic and diastolic phases were analyzed on a dedicated work station using MPR, MIP and VRT modes. The patient received 95 cc of contrast. FINDINGS: Aortic Valve: Severely thickened aortic valve with heavy calcification and reduced excursion the planimeter valve area is 0.979 Sq cm consistent with severe aortic stenosis Anatomy: Three sinuses with R-L fusion and heavily calcified raphe; bicuspid valve Annular calcification: Severe- presence of LVOT Calcification Aortic Valve Calcium  Score: 5234 Presence of basal septal hypertrophy: 15 mm; systolic annular dimensions greater that diastolic and reported below Perimembranous septal diameter: 5 mm Mitral Valve: No calcifications Aortic Annulus Measurements- 20% Phase Major annulus diameter: 33 mm Minor annulus  diameter:25 mm Annular perimeter: 92 mm Annular area: 6.41 cm2 Aortic Measurements- 70% phase Sinotubular Junction: 32 mm Ascending Thoracic Aorta: 35 mm Aortic Arch: 28 mm Descending Thoracic Aorta: 27 mm Aortic atherosclerosis. Sinus of Valsalva Measurements: Right coronary cusp width: 35 mm Left coronary cusp width: 39 mm Non coronary cusp width: 39 mm Coronary Artery Height above Annulus: Left Main: 15 mm Left SoV height: 25 mm Right Coronary: 21 mm Right SoV height: 25 mm Optimum Fluoroscopic Angle for Delivery: LAO 4, CAU 4 Cusp overlay view angle: RAO 0, CAU 9 Valves for structural team consideration: Sapien 29 mm (scene saved) Evolut 34 mm; sinus asymmetry noted Non TAVR Valve Findings: Coronary Arteries: Normal coronary origin. Study not completed with nitroglycerin . Coronary Calcium  Score deferred in the setting of prior percutaneous intervention. Systemic veins: Normal anatomy Main Pulmonary artery: Severe dilation, 38 mm Pulmonary veins: Two right middle veins with a short common ostium, normal variant Left atrial appendage: Patent Interatrial septum: No communications Chamber dimensions: Dilated left ventricle Pericardium: No calcification. Extra Cardiac Findings as per separate reporting. Notable artifacts: None Image quality: Good IMPRESSION: 1. Severe Aortic stenosis. Findings pertinent to TAVR procedure are detailed above, unless candidate for SAVR. RECOMMENDATIONS: The proposed cut-off value of 1,651 AU yielded a 93 % sensitivity and 75 % specificity in grading AS severity in patients with classical low-flow, low-gradient AS. Proposed different cut-off values to define severe AS for men and women as 2,065 AU and 1,274 AU, respectively. The joint European and American recommendations for the assessment of AS consider the aortic valve calcium  score as a continuum - a very high calcium  score suggests severe AS and a low calcium  score suggests severe AS is unlikely. Donney VEAR Jarome LULLA Stephen RENETTE, et  al. 2017 ESC/EACTS Guidelines for the management of valvular heart disease. Eur Heart J [phone] Coronary artery calcium  (CAC) score is a strong predictor of incident coronary heart disease (CHD) and provides predictive information beyond traditional risk factors. CAC scoring is reasonable to use in the decision to withhold, postpone, or initiate statin therapy in intermediate-risk or selected borderline-risk asymptomatic adults (age 19-75 years and LDL-C >=70 to <190 mg/dL) who do not have diabetes or established atherosclerotic cardiovascular disease (ASCVD).* In intermediate-risk (10-year ASCVD risk >=7.5% to <20%) adults or selected borderline-risk (10-year ASCVD risk >=5% to <7.5%) adults in whom a CAC score is measured for the purpose of making a treatment decision the following recommendations have been made: If CAC = 0, it is reasonable to withhold statin therapy and reassess in 5 to 10 years, as long as higher risk conditions are absent (diabetes mellitus, family history of premature CHD in first degree relatives (males <55 years; females <65 years), cigarette smoking, LDL >=190 mg/dL or other independent risk factors). If CAC is  1 to 99, it is reasonable to initiate statin therapy for patients >=85 years of age. If CAC is >=100 or >=75th percentile, it is reasonable to initiate statin therapy at any age. Cardiology referral should be considered for patients with CAC scores >=400 or >=75th percentile. *2018 AHA/ACC/AACVPR/AAPA/ABC/ACPM/ADA/AGS/APhA/ASPC/NLA/PCNA Guideline on the Management of Blood Cholesterol: A Report of the American College of Cardiology/American Heart Association Task Force on Clinical Practice Guidelines. J Am Coll Cardiol. 2019;73(24):3168-3209. Mahesh  Chandrasekhar Electronically Signed   By: Stanly Leavens M.D.   On: 05/19/2023 11:50   Addendum Date: 05/19/2023 ADDENDUM REPORT: 05/19/2023 10:29 ADDENDUM: CLINICAL DATA: Preoperative, TAVR radiology over-read EXAM:  CTA CHEST ABDOMEN AND PELVIS WITHOUT AND WITH CONTRAST TECHNIQUE: Multidetector CT imaging of the chest, abdomen and pelvis was performed using the standard protocol during bolus administration of intravenous contrast. Multiplanar reconstructed images and MIPs were obtained and reviewed to evaluate the vascular anatomy. RADIATION DOSE REDUCTION: This exam was performed according to the departmental dose-optimization program which includes automated exposure control, adjustment of the mA and/or kV according to patient size and/or use of iterative reconstruction technique. CONTRAST: 95mL OMNIPAQUE  IOHEXOL  350 MG/ML SOLN COMPARISON: CT chest, 11/17/2022 FINDINGS: CTA CHEST FINDINGS VASCULAR Aorta: Satisfactory opacification of the aorta dense aortic valve calcifications. Normal contour and caliber of the thoracic aorta. No evidence of aneurysm, dissection, or other acute aortic pathology. Mild thoracic aortic atherosclerosis. Cardiovascular: No evidence of obvious pulmonary embolism on limited non-tailored examination with no meaningful opacification of the pulmonary arteries. Enlargement of the main pulmonary artery measuring up to 3.8 cm in caliber. Cardiomegaly. Three-vessel coronary artery calcifications and or stents. No pericardial effusion. Review of the MIP images confirms the above findings. NON VASCULAR Mediastinum/Nodes: No enlarged mediastinal, hilar, or axillary lymph nodes. Thyroid gland, trachea, and esophagus demonstrate no significant findings. Lungs/Pleura: Moderate bilateral pleural effusions and associated atelectasis or consolidation. Interlobular septal thickening and mosaic ground-glass airspace attenuation throughout the lungs. Musculoskeletal: No chest wall abnormality. No acute osseous findings. Review of the MIP images confirms the above findings. CTA ABDOMEN AND PELVIS FINDINGS VASCULAR Normal contour and caliber of the abdominal aorta with no significant tortuosity of the abdominal aorta  and mild tortuosity of the iliac systems. Mild mixed calcific atherosclerosis. No evidence of aneurysm, dissection, or other acute aortic pathology. Standard branching pattern of the abdominal aorta with solitary bilateral renal arteries. Review of the MIP images confirms the above findings. NON-VASCULAR Hepatobiliary: No solid liver abnormality is seen. No gallstones, gallbladder wall thickening, or biliary dilatation. Pancreas: Unremarkable. No pancreatic ductal dilatation or surrounding inflammatory changes. Spleen: Normal in size without significant abnormality. Adrenals/Urinary Tract: Unchanged, definitively benign macroscopic fat containing left adrenal adenoma, for which no further follow-up or characterization is required. Kidneys are normal, without renal calculi, solid lesion, or hydronephrosis. Bladder is unremarkable. Stomach/Bowel: Stomach is within normal limits. Appendix appears normal. No evidence of bowel wall thickening, distention, or inflammatory changes. Occasional sigmoid diverticula. Lymphatic: No enlarged abdominal or pelvic lymph nodes. Reproductive: No obvious abnormality. Evaluation of the low pelvis very limited by dense metallic streak artifact. Other: No abdominal wall hernia or abnormality. No ascites. Musculoskeletal: No acute osseous findings. Status post bilateral hip total arthroplasty with associated dense metallic streak artifact, which limits evaluation of the low pelvis. Please note that definitively benign incidental findings, functional findings, and anatomic variants may have been intentionally omitted from this report in the interest of brevity and clarity. If not specifically noted and recommended in the impression, no further follow-up  or characterization is required for incidental findings. IMPRESSION: 1. Dense aortic valve calcifications in keeping with aortic stenosis. 2. Normal contour and caliber of the thoracic and abdominal aorta with no significant tortuosity of  the abdominal aorta and mild tortuosity of the iliac systems. Generally mild mixed calcific atherosclerosis. No evidence of aneurysm, dissection, or other acute aortic pathology. Standard branching pattern of the abdominal aorta. 3. Please see separately provided cardiology report for detailed measurements and parameters relevant to TAVR procedure. 4. Pulmonary edema with moderate bilateral pleural effusions and associated atelectasis or consolidation. 5. Cardiomegaly and coronary artery disease. 6. Enlargement of the main pulmonary artery, as can be seen in pulmonary hypertension. Aortic Atherosclerosis (ICD10-I70.0). Electronically Signed By: Marolyn JONETTA Jaksch M.D. On: 05/19/2023 09:56 Electronically Signed   By: Marolyn JONETTA Jaksch M.D.   On: 05/19/2023 10:29   Result Date: 05/19/2023 CLINICAL DATA:  Preoperative, TAVR radiology over-read EXAM: CTA CHEST ABDOMEN AND PELVIS WITHOUT AND WITH CONTRAST TECHNIQUE: Multidetector CT imaging of the chest, abdomen and pelvis was performed using the standard protocol during bolus administration of intravenous contrast. Multiplanar reconstructed images and MIPs were obtained and reviewed to evaluate the vascular anatomy. RADIATION DOSE REDUCTION: This exam was performed according to the departmental dose-optimization program which includes automated exposure control, adjustment of the mA and/or kV according to patient size and/or use of iterative reconstruction technique. CONTRAST:  95mL OMNIPAQUE  IOHEXOL  350 MG/ML SOLN COMPARISON:  CT chest, 11/17/2022 FINDINGS: CTA CHEST FINDINGS VASCULAR Aorta: Satisfactory opacification of the aorta dense aortic valve calcifications. Normal contour and caliber of the thoracic aorta. No evidence of aneurysm, dissection, or other acute aortic pathology. Mild thoracic aortic atherosclerosis. Cardiovascular: No evidence of obvious pulmonary embolism on limited non-tailored examination with no meaningful opacification of the pulmonary arteries.  Enlargement of the main pulmonary artery measuring up to 3.8 cm in caliber. Cardiomegaly. Three-vessel coronary artery calcifications and or stents. No pericardial effusion. Review of the MIP images confirms the above findings. NON VASCULAR Mediastinum/Nodes: No enlarged mediastinal, hilar, or axillary lymph nodes. Thyroid gland, trachea, and esophagus demonstrate no significant findings. Lungs/Pleura: Moderate bilateral pleural effusions and associated atelectasis or consolidation. Interlobular septal thickening and mosaic ground-glass airspace attenuation throughout the lungs. Musculoskeletal: No chest wall abnormality. No acute osseous findings. Review of the MIP images confirms the above findings. CTA ABDOMEN AND PELVIS FINDINGS VASCULAR Normal contour and caliber of the abdominal aorta with no significant tortuosity of the abdominal aorta and mild tortuosity of the iliac systems. Mild mixed calcific atherosclerosis. No evidence of aneurysm, dissection, or other acute aortic pathology. Standard branching pattern of the abdominal aorta with solitary bilateral renal arteries. Review of the MIP images confirms the above findings. NON-VASCULAR Hepatobiliary: No solid liver abnormality is seen. No gallstones, gallbladder wall thickening, or biliary dilatation. Pancreas: Unremarkable. No pancreatic ductal dilatation or surrounding inflammatory changes. Spleen: Normal in size without significant abnormality. Adrenals/Urinary Tract: Unchanged, definitively benign macroscopic fat containing left adrenal adenoma, for which no further follow-up or characterization is required. Kidneys are normal, without renal calculi, solid lesion, or hydronephrosis. Bladder is unremarkable. Stomach/Bowel: Stomach is within normal limits. Appendix appears normal. No evidence of bowel wall thickening, distention, or inflammatory changes. Occasional sigmoid diverticula. Lymphatic: No enlarged abdominal or pelvic lymph nodes. Reproductive:  No obvious abnormality. Evaluation of the low pelvis very limited by dense metallic streak artifact. Other: No abdominal wall hernia or abnormality. No ascites. Musculoskeletal: No acute osseous findings. Status post bilateral hip total arthroplasty with associated dense metallic streak artifact,  which limits evaluation of the low pelvis. Please note that definitively benign incidental findings, functional findings, and anatomic variants may have been intentionally omitted from this report in the interest of brevity and clarity. If not specifically noted and recommended in the impression, no further follow-up or characterization is required for incidental findings. IMPRESSION: 1. Dense aortic valve calcifications in keeping with aortic stenosis. 2. Normal contour and caliber of the thoracic and abdominal aorta with no significant tortuosity of the abdominal aorta and mild tortuosity of the iliac systems. Generally mild mixed calcific atherosclerosis. No evidence of aneurysm, dissection, or other acute aortic pathology. Standard branching pattern of the abdominal aorta. 3. Please see separately provided cardiology report for detailed measurements and parameters relevant to TAVR procedure. 4. Pulmonary edema with moderate bilateral pleural effusions and associated atelectasis or consolidation. 5. Cardiomegaly and coronary artery disease. 6. Enlargement of the main pulmonary artery, as can be seen in pulmonary hypertension. Aortic Atherosclerosis (ICD10-I70.0). Electronically Signed: By: Marolyn JONETTA Jaksch M.D. On: 05/19/2023 09:56   ECHOCARDIOGRAM COMPLETE Result Date: 05/18/2023    ECHOCARDIOGRAM REPORT   Patient Name:   Joshua [name] Date of Exam: 05/18/2023 Medical Rec #:  [id number]           Height:       73.0 in Accession #:    [id number]          Weight:       231.0 lb Date of Birth:  March 19, 1961           BSA:          2.288 m Patient Age:    62 years            BP:           128/87 mmHg Patient Gender: M                    HR:           71 bpm. Exam Location:  Inpatient Procedure: 2D Echo, Cardiac Doppler, Color Doppler and 3D Echo Indications:    I35.0 Nonrheumatic aortic (valve) stenosis  History:        Patient has prior history of Echocardiogram examinations, most                 recent 02/09/2023. CHF and Cardiomyopathy, CAD, Aortic Valve                 Disease; Risk Factors:Dyslipidemia. Bicuspid aortic valve.  Sonographer:    Ellouise Mose RDCS Referring Phys: [phone] [facility]STOP[facility] END IMPRESSIONS  1. Left ventricular ejection fraction, by estimation, is 30 to 35%. The left ventricle has moderately decreased function. The left ventricle demonstrates global hypokinesis. There is mild concentric left ventricular hypertrophy. Indeterminate diastolic filling due to E-A fusion. Elevated left ventricular end-diastolic pressure.  2. Right ventricular systolic function is low normal. The right ventricular size is normal.  3. Left atrial size was moderately dilated.  4. Right atrial size was mildly dilated.  5. Moderate pleural effusion in both left and right lateral regions.  6. The mitral valve is normal in structure. Trivial mitral valve regurgitation. No evidence of mitral stenosis.  7. The aortic valve is bicuspid. There is severe calcifcation of the aortic valve. There is moderate thickening of the aortic valve. Aortic valve regurgitation is mild. Severe aortic valve stenosis. Aortic valve mean gradient measures 50.0 mmHg. Aortic valve Vmax measures 4.47 m/s.  8. The inferior vena cava is dilated in size  with <50% respiratory variability, suggesting right atrial pressure of 15 mmHg. Comparison(s): Changes from prior study are noted. The left ventricular function is significantly worse. Conclusion(s)/Recommendation(s): Now with reduced LVEF, continues to have severe bicuspid aortic stenosis. FINDINGS  Left Ventricle: Left ventricular ejection fraction, by estimation, is 30 to 35%. The left ventricle has moderately decreased  function. The left ventricle demonstrates global hypokinesis. The left ventricular internal cavity size was normal in size. There is mild concentric left ventricular hypertrophy. Indeterminate diastolic filling due to E-A fusion. Elevated left ventricular end-diastolic pressure. The E/e' is 33. Right Ventricle: The right ventricular size is normal. Right vetricular wall thickness was not well visualized. Right ventricular systolic function is low normal. Left Atrium: Left atrial size was moderately dilated. Right Atrium: Right atrial size was mildly dilated. Pericardium: There is no evidence of pericardial effusion. Mitral Valve: The mitral valve is normal in structure. Trivial mitral valve regurgitation. No evidence of mitral valve stenosis. MV peak gradient, 9.4 mmHg. The mean mitral valve gradient is 4.0 mmHg. Tricuspid Valve: The tricuspid valve is normal in structure. Tricuspid valve regurgitation is trivial. No evidence of tricuspid stenosis. Aortic Valve: The aortic valve is bicuspid. There is severe calcifcation of the aortic valve. There is moderate thickening of the aortic valve. Aortic valve regurgitation is mild. Severe aortic stenosis is present. Aortic valve mean gradient measures 50.0 mmHg. Aortic valve peak gradient measures 80.0 mmHg. Aortic valve area, by VTI measures 1.15 cm. Pulmonic Valve: The pulmonic valve was not well visualized. Pulmonic valve regurgitation is trivial. No evidence of pulmonic stenosis. Aorta: The aortic root and ascending aorta are structurally normal, with no evidence of dilitation. Venous: The inferior vena cava is dilated in size with less than 50% respiratory variability, suggesting right atrial pressure of 15 mmHg. IAS/Shunts: The atrial septum is grossly normal. Additional Comments: There is a moderate pleural effusion in both left and right lateral regions.  LEFT VENTRICLE PLAX 2D LVIDd:         5.20 cm      Diastology LVIDs:         4.70 cm      LV e' medial:     3.05 cm/s LV PW:         1.30 cm      LV E/e' medial:  40.7 LV IVS:        1.00 cm      LV e' lateral:   5.77 cm/s LVOT diam:     2.50 cm      LV E/e' lateral: 21.5 LV SV:         120 LV SV Index:   53 LVOT Area:     4.91 cm  LV Volumes (MOD) LV vol d, MOD A2C: 204.0 ml LV vol d, MOD A4C: 172.0 ml LV vol s, MOD A2C: 123.0 ml LV vol s, MOD A4C: 121.0 ml LV SV MOD A2C:     81.0 ml LV SV MOD A4C:     172.0 ml LV SV MOD BP:      64.5 ml RIGHT VENTRICLE            IVC RV S prime:     8.16 cm/s  IVC diam: 2.20 cm TAPSE (M-mode): 1.7 cm LEFT ATRIUM           Index        RIGHT ATRIUM           Index LA diam:      5.40 cm  2.36 cm/m   RA Area:     20.30 cm LA Vol (A2C): 93.4 ml 40.83 ml/m  RA Volume:   63.40 ml  27.71 ml/m LA Vol (A4C): 57.3 ml 25.05 ml/m  AORTIC VALVE AV Area (Vmax):    1.27 cm AV Area (Vmean):   1.21 cm AV Area (VTI):     1.15 cm AV Vmax:           447.20 cm/s AV Vmean:          311.400 cm/s AV VTI:            1.044 m AV Peak Grad:      80.0 mmHg AV Mean Grad:      50.0 mmHg LVOT Vmax:         116.00 cm/s LVOT Vmean:        76.700 cm/s LVOT VTI:          0.245 m LVOT/AV VTI ratio: 0.23  AORTA Ao Root diam: 3.70 cm Ao Asc diam:  3.70 cm MITRAL VALVE MV Area (PHT): 5.13 cm     SHUNTS MV Area VTI:   2.96 cm     Systemic VTI:  0.24 m MV Peak grad:  9.4 mmHg     Systemic Diam: 2.50 cm MV Mean grad:  4.0 mmHg MV Vmax:       1.53 m/s MV Vmean:      88.5 cm/s MV Decel Time: 148 msec MV E velocity: 124.00 cm/s Shelda Bruckner MD Electronically signed by Shelda Bruckner MD Signature Date/Time: 05/18/2023/7:08:45 PM    Final    CARDIAC CATHETERIZATION Result Date: 05/18/2023 Conclusions: Mild, non-obstructive coronary artery disease with 20% proximal RCA stenosis and mild luminal irregularities in the LAD and LCx. Patent mid LAD and proximal RCA stents with mild (~10%) in-stent restenosis. Severe elevated left heart, right heart, and pulmonary artery pressures (see details below). Severely  reduced Fick cardiac output/index. Recommendations: Escalate diuresis; will transition back to furosemide  40 mg IV BID. Hold carvedilol  in the setting of severely reduced cardiac output and decompensated heart failure. Proceed with workup for aortic valve intervention per structural heart team. Continue secondary prevention of coronary artery disease. Bruckner Hanson, MD [facility]      Scheduled Meds:  aspirin  EC  81 mg Oral Daily   furosemide   40 mg Intravenous BID   lidocaine  (PF)  10 mL Infiltration Once   mupirocin  cream   Topical Daily   sodium chloride  flush  3 mL Intravenous Q12H   sodium chloride  flush  3 mL Intravenous Q12H   Continuous Infusions:     LOS: 5 days    Time spent:    Sigurd Pac, MD [facility]   05/20/2023, 10:41 AM

## 2023-05-20 NOTE — Progress Notes (Signed)
   05/20/23 0016  BiPAP/CPAP/SIPAP  Reason BIPAP/CPAP not in use Non-compliant (Refused, does not wear one at home)  BiPAP/CPAP /SiPAP Vitals  Resp 15  MEWS Score/Color  MEWS Score 1  MEWS Score Color Green

## 2023-05-21 ENCOUNTER — Ambulatory Visit: Payer: Medicaid Other | Admitting: Cardiovascular Disease

## 2023-05-21 DIAGNOSIS — I5033 Acute on chronic diastolic (congestive) heart failure: Secondary | ICD-10-CM | POA: Diagnosis not present

## 2023-05-21 LAB — BASIC METABOLIC PANEL
Anion gap: 15 (ref 5–15)
BUN: 18 mg/dL (ref 8–23)
CO2: 25 mmol/L (ref 22–32)
Calcium: 8.6 mg/dL — ABNORMAL LOW (ref 8.9–10.3)
Chloride: 99 mmol/L (ref 98–111)
Creatinine, Ser: 1.1 mg/dL (ref 0.61–1.24)
GFR, Estimated: >60 mL/min (ref >60)
Glucose, Bld: 179 mg/dL — ABNORMAL HIGH (ref 70–99)
Potassium: 4 mmol/L (ref 3.5–5.1)
Sodium: 139 mmol/L (ref 135–145)

## 2023-05-21 LAB — MAGNESIUM: Magnesium: 2 mg/dL (ref 1.7–2.4)

## 2023-05-21 MED ORDER — FUROSEMIDE 40 MG PO TABS
40.0000 mg | ORAL_TABLET | Freq: Every day | ORAL | Status: DC
  Administered 2023-05-22 – 2023-05-24 (×3): 40 mg via ORAL
  Filled 2023-05-21 (×3): qty 1

## 2023-05-21 NOTE — Progress Notes (Signed)
 CARDIAC REHAB PHASE I      Pre-op OHS education including OHS booklet, OHS handout, IS use, mobility importance, home needs at discharge and sternal precautions/move in the tube reviewed. All questions and concerns addressed. Will continue to follow.   Vaughn Asberry Hacking, RN BSN 05/21/2023 2:13 PM

## 2023-05-21 NOTE — Progress Notes (Addendum)
 PROGRESS NOTE    Joshua [name]  FMW:[id number] DOB: Aug 20, 1960 DOA: 05/15/2023 PCP: Vicci Barnie NOVAK, MD  62/M with history of CAD, prior PCI and stenting of LAD and RCA in 2018, recently diagnosed severe aortic stenosis, seen by structural heart team in November, however was asymptomatic from this standpoint and recommended follow-up.  Presented to the ED 1/31 with progressive shortness of breath, orthopnea and edema, symptoms started a week ago, significantly worse the day of admission.  In the ER he was hypoxic to 70%, placed on a nonrebreather and then BiPAP.  Workup noted BNP 2270, troponin 34, chest x-ray with pulmonary vascular congestion and small left pleural effusion. -Right and left heart cath with minimal nonobstructive CAD, elevated filling pressures, depressed cardiac output/index -Thoracentesis on the right, 1.3 L drained -Plan for AVR on Monday  Subjective: -Feels better overall, breathing has much improved  Assessment and Plan:  Acute systolic and diastolic CHF Bilateral moderate pleural effusions Last echo 10/24 with EF 55%, grade 2 DD, normal RV, severe aortic stenosis -Likely driven by severe aortic stenosis, required BiPAP on admission -Repeat echo with EF down to 30-35%, global hypokinesis, known critical aortic stenosis -Improving with diuresis, 14L negative, right heart cath 2 days ago with elevated filling pressures, depressed cardiac index,  -Now appears euvolemic, changed to oral Lasix  Coreg  discontinued, avoid excessive GDMT with severe AS -Underwent right thoracentesis 2/4, 1.3 L drained, fluid appears transudative, cultures negative, follow-up cytology for completeness -Increase activity  Severe aortic stenosis -Seen by structural heart team Dr. Verlin in 11/24, was asymptomatic at the time -structural heart team now following -Cath with mild nonobstructive CAD -Plan for AVR on Monday by Dr. Lucas  History of CAD -Prior PCI and stenting LAD and  RCA in 2018 -Continue aspirin , held carvedilol  with depressed cardiac output/index  Complete heart block Asymptomatic during sleep, single episode, carvedilol  dose decreased  Hypertension -Decreased carvedilol , diuretics as above, discontinued amlodipine  and lisinopril   Hyperglycemia, borderline diabetes -A1 C6.0,  Daily cannabis use -Counseled  DVT prophylaxis: Restart Lovenox  Code Status: Full code Family Communication: No family at bedside Disposition Plan: Home pending above workup  Consultants:    Procedures:   Antimicrobials:    Objective: Vitals:   05/21/23 0059 05/21/23 0103 05/21/23 0444 05/21/23 0730  BP:  101/78 121/86   Pulse:  89 88 74  Resp:  18 18   Temp:  98.7 F (37.1 C) 98.7 F (37.1 C) 99.3 F (37.4 C)  TempSrc:  Oral Oral Oral  SpO2:  93% 98% 95%  Weight: 101.2 kg  101.2 kg   Height:        Intake/Output Summary (Last 24 hours) at 05/21/2023 1138 Last data filed at 05/21/2023 0843 Gross per 24 hour  Intake 1080 ml  Output 2000 ml  Net -920 ml   Filed Weights   05/20/23 0409 05/21/23 0059 05/21/23 0444  Weight: 101.8 kg 101.2 kg 101.2 kg    Examination:  General exam: AAOx3, no distress HEENT: no JVD CVS: S1-S2, regular rhythm, systolic murmur Lungs: Decreased breath sounds on the left Abdomen: Soft, nontender, bowel sounds present Extremities: No edema Skin: No rashes Psychiatry:  Mood & affect appropriate.     Data Reviewed:   CBC: Recent Labs  Lab 05/15/23 1604 05/16/23 0455 05/17/23 0250 05/18/23 1126 05/18/23 1128  WBC 8.7 7.7 8.7  --   --   NEUTROABS 7.4  --   --   --   --   HGB 16.7 15.4  15.8 16.3 16.3  HCT 51.3 47.3 48.8 48.0 48.0  MCV 92.6 92.4 94.2  --   --   PLT 169 157 172  --   --    Basic Metabolic Panel: Recent Labs  Lab 05/15/23 1816 05/16/23 0455 05/17/23 0250 05/17/23 2009 05/18/23 1126 05/18/23 1128 05/19/23 0246 05/20/23 0246 05/21/23 0242  NA  --    < > 141 141 133* 141 142 138 139   K  --    < > 3.6 4.2 3.6 3.9 4.0 3.6 4.0  CL  --    < > 104 101  --   --  101 98 99  CO2  --    < > 28 29  --   --  29 28 25   GLUCOSE  --    < > 165* 128*  --   --  124* 183* 179*  BUN  --    < > 13 15  --   --  11 14 18   CREATININE  --    < > 1.00 1.29*  --   --  0.86 0.99 1.10  CALCIUM   --    < > 8.5* 8.6*  --   --  8.9 8.8* 8.6*  MG 2.2  --   --  2.1  --   --   --   --  2.0   < > = values in this interval not displayed.   GFR: Estimated Creatinine Clearance: 84.8 mL/min (by C-G formula based on SCr of 1.1 mg/dL). Liver Function Tests: Recent Labs  Lab 05/16/23 0455 05/17/23 0250 05/19/23 1022  AST 28 23  --   ALT 60* 47*  --   ALKPHOS 95 93  --   BILITOT 1.2 0.8  --   PROT 5.9* 6.0* 6.3*  ALBUMIN  3.2* 3.2*  --    No results for input(s): LIPASE, AMYLASE in the last 168 hours. No results for input(s): AMMONIA in the last 168 hours. Coagulation Profile: No results for input(s): INR, PROTIME in the last 168 hours. Cardiac Enzymes: No results for input(s): CKTOTAL, CKMB, CKMBINDEX, TROPONINI in the last 168 hours. BNP (last 3 results) No results for input(s): PROBNP in the last 8760 hours. HbA1C: No results for input(s): HGBA1C in the last 72 hours.  CBG: No results for input(s): GLUCAP in the last 168 hours. Lipid Profile: No results for input(s): CHOL, HDL, LDLCALC, TRIG, CHOLHDL, LDLDIRECT in the last 72 hours. Thyroid Function Tests: No results for input(s): TSH, T4TOTAL, FREET4, T3FREE, THYROIDAB in the last 72 hours. Anemia Panel: No results for input(s): VITAMINB12, FOLATE, FERRITIN, TIBC, IRON, RETICCTPCT in the last 72 hours. Urine analysis:    Component Value Date/Time   COLORURINE COLORLESS (A) 11/22/2018 1900   APPEARANCEUR CLEAR 11/22/2018 1900   LABSPEC 1.002 (L) 11/22/2018 1900   PHURINE 6.0 11/22/2018 1900   GLUCOSEU NEGATIVE 11/22/2018 1900   HGBUR NEGATIVE 11/22/2018 1900   BILIRUBINUR  NEGATIVE 11/22/2018 1900   KETONESUR NEGATIVE 11/22/2018 1900   PROTEINUR NEGATIVE 11/22/2018 1900   NITRITE NEGATIVE 11/22/2018 1900   LEUKOCYTESUR NEGATIVE 11/22/2018 1900   Sepsis Labs: @LABRCNTIP (procalcitonin:4,lacticidven:4)  ) Recent Results (from the past 240 hours)  Resp panel by RT-PCR (RSV, Flu A&B, Covid) Anterior Nasal Swab     Status: None   Collection Time: 05/15/23  2:43 PM   Specimen: Anterior Nasal Swab  Result Value Ref Range Status   SARS Coronavirus 2 by RT PCR NEGATIVE NEGATIVE Final   Influenza A by  PCR NEGATIVE NEGATIVE Final   Influenza B by PCR NEGATIVE NEGATIVE Final    Comment: (NOTE) The Xpert Xpress SARS-CoV-2/FLU/RSV plus assay is intended as an aid in the diagnosis of influenza from Nasopharyngeal swab specimens and should not be used as a sole basis for treatment. Nasal washings and aspirates are unacceptable for Xpert Xpress SARS-CoV-2/FLU/RSV testing.  Fact Sheet for Patients: bloggercourse.com  Fact Sheet for Healthcare Providers: seriousbroker.it  This test is not yet approved or cleared by the [place]  FDA and has been authorized for detection and/or diagnosis of SARS-CoV-2 by FDA under an Emergency Use Authorization (EUA). This EUA will remain in effect (meaning this test can be used) for the duration of the COVID-19 declaration under Section 564(b)(1) of the Act, 21 U.S.C. section 360bbb-3(b)(1), unless the authorization is terminated or revoked.     Resp Syncytial Virus by PCR NEGATIVE NEGATIVE Final    Comment: (NOTE) Fact Sheet for Patients: bloggercourse.com  Fact Sheet for Healthcare Providers: seriousbroker.it  This test is not yet approved or cleared by the [place]  FDA and has been authorized for detection and/or diagnosis of SARS-CoV-2 by FDA under an Emergency Use Authorization (EUA). This EUA will remain in  effect (meaning this test can be used) for the duration of the COVID-19 declaration under Section 564(b)(1) of the Act, 21 U.S.C. section 360bbb-3(b)(1), unless the authorization is terminated or revoked.  Performed at [facility], [address], [place], [place] [address]   Body fluid culture w Gram Stain     Status: None (Preliminary result)   Collection Time: 05/19/23  2:10 PM   Specimen: Lung, Right; Pleural Fluid  Result Value Ref Range Status   Specimen Description PLEURAL  Final   Special Requests right lung  Final   Gram Stain   Final    RARE WBC PRESENT,BOTH PMN AND MONONUCLEAR NO ORGANISMS SEEN    Culture   Final    NO GROWTH 2 DAYS Performed at [facility], [address], [place], [place] [address]    Report Status PENDING  Incomplete     Radiology Studies: VAS US  CAROTID Result Date: 05/20/2023 Carotid Arterial Duplex Study Patient Name:  Haakon R Koral  Date of Exam:   05/20/2023 Medical Rec #: [id number]            Accession #:    [id number] Date of Birth: January 14, 1961            Patient Gender: M Patient Age:   33 years Exam Location:  [facility] Procedure:      VAS US  CAROTID Referring Phys: DORISE FELLERS --------------------------------------------------------------------------------  Indications:       Preop for MVR. Risk Factors:      Hypertension, coronary artery disease. Comparison Study:  01/05/2018 - 1-39% Performing Technologist: Ricka Holland RDMS, RVT  Examination Guidelines: A complete evaluation includes B-mode imaging, spectral Doppler, color Doppler, and power Doppler as needed of all accessible portions of each vessel. Bilateral testing is considered an integral part of a complete examination. Limited examinations for reoccurring indications may be performed as noted.  Right Carotid Findings: +----------+--------+--------+--------+------------------+------------------+           PSV cm/sEDV cm/sStenosisPlaque DescriptionComments            +----------+--------+--------+--------+------------------+------------------+ CCA Prox  62      11                                                   +----------+--------+--------+--------+------------------+------------------+  CCA Distal56      15                                intimal thickening +----------+--------+--------+--------+------------------+------------------+ ICA Prox  45      17      1-39%   calcific                             +----------+--------+--------+--------+------------------+------------------+ ICA Distal53      14                                                   +----------+--------+--------+--------+------------------+------------------+ ECA       62      5                                                    +----------+--------+--------+--------+------------------+------------------+ +----------+--------+-------+----------------+-------------------+           PSV cm/sEDV cmsDescribe        Arm Pressure (mmHG) +----------+--------+-------+----------------+-------------------+ [id number]             Multiphasic, WNL                    +----------+--------+-------+----------------+-------------------+ +---------+--------+--+--------+--+---------+ VertebralPSV cm/s33EDV cm/s13Antegrade +---------+--------+--+--------+--+---------+  Left Carotid Findings: +----------+--------+--------+--------+------------------+------------------+           PSV cm/sEDV cm/sStenosisPlaque DescriptionComments           +----------+--------+--------+--------+------------------+------------------+ CCA Prox  69      14                                                   +----------+--------+--------+--------+------------------+------------------+ CCA Distal49      18                                intimal thickening +----------+--------+--------+--------+------------------+------------------+ ICA Prox  41      16      1-39%                      intimal thickening +----------+--------+--------+--------+------------------+------------------+ ICA Distal57      22                                                   +----------+--------+--------+--------+------------------+------------------+ ECA       68      13                                                   +----------+--------+--------+--------+------------------+------------------+ +----------+--------+--------+----------------+-------------------+           PSV cm/sEDV cm/sDescribe        Arm Pressure (  mmHG) +----------+--------+--------+----------------+-------------------+ [id number]              Multiphasic, WNL                    +----------+--------+--------+----------------+-------------------+ +---------+--------+--------+--------------+ VertebralPSV cm/sEDV cm/sNot identified +---------+--------+--------+--------------+   Summary: Right Carotid: Velocities in the right ICA are consistent with a 1-39% stenosis. Left Carotid: Velocities in the left ICA are consistent with a 1-39% stenosis.  *See table(s) above for measurements and observations.  Electronically signed by Gaile New MD on 05/20/2023 at 5:55:51 PM.    Final    IR THORACENTESIS ASP PLEURAL SPACE W/IMG GUIDE Result Date: 05/19/2023 INDICATION: Congestive heart failure, hypoxia and bilateral pleural effusions. Request for diagnostic and therapeutic thoracentesis. EXAM: ULTRASOUND GUIDED RIGHT THORACENTESIS MEDICATIONS: 1% lidocaine  10 mL COMPLICATIONS: None immediate. PROCEDURE: An ultrasound guided thoracentesis was thoroughly discussed with the patient and questions answered. The benefits, risks, alternatives and complications were also discussed. The patient understands and wishes to proceed with the procedure. Written consent was obtained. Ultrasound was performed to localize and mark an adequate pocket of fluid in the right chest. The area was then prepped and draped in the  normal sterile fashion. 1% Lidocaine  was used for local anesthesia. Under ultrasound guidance a 6 Fr Safe-T-Centesis catheter was introduced. Thoracentesis was performed. The catheter was removed and a dressing applied. FINDINGS: A total of approximately 1.3 L of reddish colored fluid was removed. Samples were sent to the laboratory as requested by the clinical team. IMPRESSION: Successful ultrasound guided right thoracentesis yielding 1.3 L of pleural fluid. No pneumothorax on post-procedure chest x-ray. Procedure performed by: Sari Lamp, PA-C Electronically Signed   By: Ester Sides M.D.   On: 05/19/2023 16:01   DG Chest 1 View Result Date: 05/19/2023 CLINICAL DATA:  Pleural effusion.  Status post right thoracentesis. EXAM: CHEST  1 VIEW COMPARISON:  Chest radiograph dated 05/15/2023. FINDINGS: Small left pleural effusion and left lung base atelectasis. Overall significant improvement in aeration of the lungs compared to prior radiograph. No significant pleural effusion on the right. There is no pneumothorax. Stable cardiac silhouette. Atherosclerotic calcification of the aorta. No acute osseous pathology. IMPRESSION: Significant improvement in aeration of the lungs.  No pneumothorax. Electronically Signed   By: Vanetta Chou M.D.   On: 05/19/2023 15:22      Scheduled Meds:  aspirin  EC  81 mg Oral Daily   enoxaparin  (LOVENOX ) injection  40 mg Subcutaneous Q24H   furosemide   40 mg Intravenous BID   lidocaine  (PF)  10 mL Infiltration Once   mupirocin  cream   Topical Daily   sodium chloride  flush  3 mL Intravenous Q12H   sodium chloride  flush  3 mL Intravenous Q12H   Continuous Infusions:     LOS: 6 days    Time spent:    Sigurd Pac, MD [facility]   05/21/2023, 11:38 AM

## 2023-05-21 NOTE — Plan of Care (Signed)

## 2023-05-22 DIAGNOSIS — I10 Essential (primary) hypertension: Secondary | ICD-10-CM | POA: Diagnosis not present

## 2023-05-22 DIAGNOSIS — I442 Atrioventricular block, complete: Secondary | ICD-10-CM | POA: Diagnosis not present

## 2023-05-22 DIAGNOSIS — R7303 Prediabetes: Secondary | ICD-10-CM

## 2023-05-22 DIAGNOSIS — I2510 Atherosclerotic heart disease of native coronary artery without angina pectoris: Secondary | ICD-10-CM | POA: Diagnosis not present

## 2023-05-22 DIAGNOSIS — E785 Hyperlipidemia, unspecified: Secondary | ICD-10-CM | POA: Diagnosis not present

## 2023-05-22 DIAGNOSIS — I5023 Acute on chronic systolic (congestive) heart failure: Secondary | ICD-10-CM

## 2023-05-22 LAB — BASIC METABOLIC PANEL
Anion gap: 10 (ref 5–15)
BUN: 15 mg/dL (ref 8–23)
CO2: 25 mmol/L (ref 22–32)
Calcium: 8.5 mg/dL — ABNORMAL LOW (ref 8.9–10.3)
Chloride: 102 mmol/L (ref 98–111)
Creatinine, Ser: 0.85 mg/dL (ref 0.61–1.24)
GFR, Estimated: >60 mL/min (ref >60)
Glucose, Bld: 132 mg/dL — ABNORMAL HIGH (ref 70–99)
Potassium: 3.9 mmol/L (ref 3.5–5.1)
Sodium: 137 mmol/L (ref 135–145)

## 2023-05-22 MED ORDER — NITROGLYCERIN IN D5W 200-5 MCG/ML-% IV SOLN
2.0000 ug/min | INTRAVENOUS | Status: DC
Start: 2023-05-25 — End: 2023-05-26
  Filled 2023-05-22: qty 250

## 2023-05-22 MED ORDER — TRANEXAMIC ACID (OHS) BOLUS VIA INFUSION
15.0000 mg/kg | INTRAVENOUS | Status: AC
  Administered 2023-05-25: 1518 mg via INTRAVENOUS
  Filled 2023-05-22: qty 1518

## 2023-05-22 MED ORDER — EPINEPHRINE HCL 5 MG/250ML IV SOLN IN NS
0.0000 ug/min | INTRAVENOUS | Status: AC
Start: 2023-05-25 — End: 2023-05-26
  Administered 2023-05-25: 2 ug/min via INTRAVENOUS
  Filled 2023-05-22: qty 250

## 2023-05-22 MED ORDER — DEXMEDETOMIDINE HCL IN NACL 400 MCG/100ML IV SOLN
0.1000 ug/kg/h | INTRAVENOUS | Status: AC
Start: 2023-05-25 — End: 2023-05-26
  Administered 2023-05-25: .3 ug/kg/h via INTRAVENOUS
  Filled 2023-05-22: qty 100

## 2023-05-22 MED ORDER — VANCOMYCIN HCL 1.5 G IV SOLR
1500.0000 mg | INTRAVENOUS | Status: AC
  Administered 2023-05-25: 1500 mg via INTRAVENOUS
  Filled 2023-05-22: qty 30

## 2023-05-22 MED ORDER — TRANEXAMIC ACID (OHS) PUMP PRIME SOLUTION
2.0000 mg/kg | INTRAVENOUS | Status: DC
  Filled 2023-05-22: qty 2.02

## 2023-05-22 MED ORDER — MILRINONE LACTATE IN DEXTROSE 20-5 MG/100ML-% IV SOLN
0.3000 ug/kg/min | INTRAVENOUS | Status: DC
  Filled 2023-05-22: qty 100

## 2023-05-22 MED ORDER — MANNITOL 20 % IV SOLN
INTRAVENOUS | Status: DC
  Filled 2023-05-22 (×2): qty 13

## 2023-05-22 MED ORDER — POTASSIUM CHLORIDE 2 MEQ/ML IV SOLN
80.0000 meq | INTRAVENOUS | Status: DC
  Filled 2023-05-22: qty 40

## 2023-05-22 MED ORDER — CEFAZOLIN SODIUM-DEXTROSE 2-4 GM/100ML-% IV SOLN
2.0000 g | INTRAVENOUS | Status: DC
  Filled 2023-05-22: qty 100

## 2023-05-22 MED ORDER — HEPARIN 30,000 UNITS/1000 ML (OHS) CELLSAVER SOLUTION
Status: DC
  Filled 2023-05-22: qty 1000

## 2023-05-22 MED ORDER — CEFAZOLIN SODIUM-DEXTROSE 2-4 GM/100ML-% IV SOLN
2.0000 g | INTRAVENOUS | Status: AC
  Administered 2023-05-25 (×2): 2 g via INTRAVENOUS
  Filled 2023-05-22: qty 100

## 2023-05-22 MED ORDER — NOREPINEPHRINE 4 MG/250ML-% IV SOLN
0.0000 ug/min | INTRAVENOUS | Status: AC
  Administered 2023-05-25: 2 ug/min via INTRAVENOUS
  Administered 2023-05-25: 4 ug/min via INTRAVENOUS
  Filled 2023-05-22: qty 250

## 2023-05-22 MED ORDER — INSULIN REGULAR(HUMAN) IN NACL 100-0.9 UT/100ML-% IV SOLN
INTRAVENOUS | Status: AC
  Administered 2023-05-25: 1.7 [IU]/h via INTRAVENOUS
  Filled 2023-05-22: qty 100

## 2023-05-22 MED ORDER — PHENYLEPHRINE HCL-NACL 20-0.9 MG/250ML-% IV SOLN
30.0000 ug/min | INTRAVENOUS | Status: DC
  Filled 2023-05-22: qty 250

## 2023-05-22 MED ORDER — PLASMA-LYTE A IV SOLN
INTRAVENOUS | Status: DC
  Filled 2023-05-22: qty 2.5

## 2023-05-22 MED ORDER — TRANEXAMIC ACID 1000 MG/10ML IV SOLN
1.5000 mg/kg/h | INTRAVENOUS | Status: AC
  Administered 2023-05-25: 1.5 mg/kg/h via INTRAVENOUS
  Filled 2023-05-22: qty 25

## 2023-05-22 NOTE — Assessment & Plan Note (Addendum)
 Echocardiogram with reduced LV systolic function to EF 30 to 64%, LV with hypokinesis, mild LVH, RV systolic function low normal, LA with moderate dilatation, RA with mild dilatation, severe aortic valve stenosis.   Clinically euvlemic.   Plan diuresis with furosemide  40 mg po daily.  Plan for TAVR on Monday.

## 2023-05-22 NOTE — Assessment & Plan Note (Signed)
 Continue blood pressure monitoring.  Off antihypertensive meds due to risk of hypotension.

## 2023-05-22 NOTE — Assessment & Plan Note (Signed)
 Continue statin.

## 2023-05-22 NOTE — Hospital Course (Addendum)
 Joshua [name] was admitted to the hospital with the working diagnosis of acute heart failure decompensation in the setting of severe aortic stenosis.   62/M with history of CAD, prior PCI and stenting of LAD and RCA in 2018, recently diagnosed severe aortic stenosis, seen by structural heart team in November, however was asymptomatic from this standpoint and recommended follow-up.  Presented to the ED 1/31 with progressive shortness of breath, orthopnea and edema, symptoms started a week ago, significantly worse the day of admission.  In the ER he was hypoxic to 70%, placed on a nonrebreather and then BiPAP.  Workup noted BNP 2270, troponin 34, chest x-ray with pulmonary vascular congestion and small left pleural effusion. -Right and left heart cath with minimal nonobstructive CAD, elevated filling pressures, depressed cardiac output/index -Thoracentesis on the right, 1.3 L drained -Plan for AVR on Monday

## 2023-05-22 NOTE — Progress Notes (Signed)
  Progress Note   Patient: Joshua [name] FMW:[id number] DOB: January 01, 1961 DOA: 05/15/2023     7 DOS: the patient was seen and examined on 05/22/2023   Brief hospital course: 62/M with history of CAD, prior PCI and stenting of LAD and RCA in 2018, recently diagnosed severe aortic stenosis, seen by structural heart team in November, however was asymptomatic from this standpoint and recommended follow-up.  Presented to the ED 1/31 with progressive shortness of breath, orthopnea and edema, symptoms started a week ago, significantly worse the day of admission.  In the ER he was hypoxic to 70%, placed on a nonrebreather and then BiPAP.  Workup noted BNP 2270, troponin 34, chest x-ray with pulmonary vascular congestion and small left pleural effusion. -Right and left heart cath with minimal nonobstructive CAD, elevated filling pressures, depressed cardiac output/index -Thoracentesis on the right, 1.3 L drained -Plan for AVR on Monday  Assessment and Plan: * Acute on chronic systolic CHF (congestive heart failure) (HCC) Echocardiogram with reduced LV systolic function to EF 30 to 64%, LV with hypokinesis, mild LVH, RV systolic function low normal, LA with moderate dilatation, RA with mild dilatation, severe aortic valve stenosis.   Urine output 1,750 ml Systolic blood pressure 114 mmHg.   Plan diuresis with furosemide  40 mg po daily.  Plan for TAVR on Monday.   Coronary artery disease involving native coronary artery of native heart without angina pectoris 2018 PCI with stent LAD and RCA.  Plan to continue aspirin , carvedilol  on hold due to low cardiac output.   Complete heart block [facility]) Patient has asymptomatic events at night.  Discontinue carvedilol .  Will need outpatient sleep study.   Dyslipidemia Continue statin   Essential hypertension Continue blood pressure monitoring.  Off antihypertensive meds due to risk of hypotension.   Borderline type 2 diabetes mellitus Hgb A1c 6,0          Subjective: Patient is feeling well, he has been out of bed and ambulating in the hallway   Physical Exam: Vitals:   05/21/23 2358 05/22/23 0406 05/22/23 0731 05/22/23 1042  BP: 113/87 (!) 133/92 (!) 118/91 120/89  Pulse: 79 75 80 80  Resp: 18 18 18 20   Temp: 98.6 F (37 C) 97.6 F (36.4 C) 98.7 F (37.1 C) 98.1 F (36.7 C)  TempSrc: Oral Oral Oral Oral  SpO2: 91% 100% 97% 94%  Weight:  101.2 kg    Height:       Neurology awake and alert ENT with no pallor Cardiovascular with S1 and S2 present and regular with no gallops, or rubs, positive systolic murmur at the base Respiratory with no rales or wheezing, no rhonchi Abdomen with no distention  Data Reviewed:    Family Communication: no family at the bedside   Disposition: Status is: Inpatient Remains inpatient appropriate because: pending aortic valve intervention   Planned Discharge Destination: Home      Author: Elidia Toribio Furnace, MD 05/22/2023 3:39 PM  For on call review [email or web address].

## 2023-05-22 NOTE — Assessment & Plan Note (Signed)
 Hgb A1c 6,0

## 2023-05-22 NOTE — Assessment & Plan Note (Signed)
 Patient has asymptomatic events at night.  Discontinue carvedilol .  Will need outpatient sleep study.

## 2023-05-22 NOTE — Assessment & Plan Note (Signed)
 2018 PCI with stent LAD and RCA.  Plan to continue aspirin , carvedilol  on hold due to low cardiac output.

## 2023-05-23 DIAGNOSIS — E785 Hyperlipidemia, unspecified: Secondary | ICD-10-CM | POA: Diagnosis not present

## 2023-05-23 DIAGNOSIS — I442 Atrioventricular block, complete: Secondary | ICD-10-CM | POA: Diagnosis not present

## 2023-05-23 DIAGNOSIS — I5023 Acute on chronic systolic (congestive) heart failure: Secondary | ICD-10-CM | POA: Diagnosis not present

## 2023-05-23 DIAGNOSIS — I10 Essential (primary) hypertension: Secondary | ICD-10-CM | POA: Diagnosis not present

## 2023-05-23 DIAGNOSIS — I2510 Atherosclerotic heart disease of native coronary artery without angina pectoris: Secondary | ICD-10-CM | POA: Diagnosis not present

## 2023-05-23 DIAGNOSIS — R7303 Prediabetes: Secondary | ICD-10-CM | POA: Diagnosis not present

## 2023-05-23 DIAGNOSIS — I350 Nonrheumatic aortic (valve) stenosis: Secondary | ICD-10-CM | POA: Diagnosis not present

## 2023-05-23 LAB — BODY FLUID CULTURE W GRAM STAIN: Culture: NO GROWTH

## 2023-05-23 LAB — BASIC METABOLIC PANEL
Anion gap: 11 (ref 5–15)
BUN: 15 mg/dL (ref 8–23)
CO2: 27 mmol/L (ref 22–32)
Calcium: 8.7 mg/dL — ABNORMAL LOW (ref 8.9–10.3)
Chloride: 101 mmol/L (ref 98–111)
Creatinine, Ser: 1.06 mg/dL (ref 0.61–1.24)
GFR, Estimated: >60 mL/min (ref >60)
Glucose, Bld: 132 mg/dL — ABNORMAL HIGH (ref 70–99)
Potassium: 4.2 mmol/L (ref 3.5–5.1)
Sodium: 139 mmol/L (ref 135–145)

## 2023-05-23 NOTE — Progress Notes (Signed)
  Progress Note   Patient: Joshua [name] FMW:[id number] DOB: 08/08/1960 DOA: 05/15/2023     8 DOS: the patient was seen and examined on 05/23/2023   Brief hospital course: 62/M with history of CAD, prior PCI and stenting of LAD and RCA in 2018, recently diagnosed severe aortic stenosis, seen by structural heart team in November, however was asymptomatic from this standpoint and recommended follow-up.  Presented to the ED 1/31 with progressive shortness of breath, orthopnea and edema, symptoms started a week ago, significantly worse the day of admission.  In the ER he was hypoxic to 70%, placed on a nonrebreather and then BiPAP.  Workup noted BNP 2270, troponin 34, chest x-ray with pulmonary vascular congestion and small left pleural effusion. -Right and left heart cath with minimal nonobstructive CAD, elevated filling pressures, depressed cardiac output/index -Thoracentesis on the right, 1.3 L drained -Plan for AVR on Monday  Assessment and Plan: * Acute on chronic systolic CHF (congestive heart failure) (HCC) Echocardiogram with reduced LV systolic function to EF 30 to 64%, LV with hypokinesis, mild LVH, RV systolic function low normal, LA with moderate dilatation, RA with mild dilatation, severe aortic valve stenosis.   Clinically euvlemic.   Plan diuresis with furosemide  40 mg po daily.  Plan for TAVR on Monday.   Coronary artery disease involving native coronary artery of native heart without angina pectoris 2018 PCI with stent LAD and RCA.  Plan to continue aspirin , carvedilol  on hold due to low cardiac output.   Complete heart block [facility]) Patient has asymptomatic events at night.  Discontinue carvedilol .  Will need outpatient sleep study.   Dyslipidemia Continue statin   Essential hypertension Continue blood pressure monitoring.  Off antihypertensive meds due to risk of hypotension.   Borderline type 2 diabetes mellitus Hgb A1c 6,0         Subjective: Patient is  feeling well, he has been ambulating with no chest pain or dyspnea, no PND, orthopnea or lower extremity edema   Physical Exam: Vitals:   05/23/23 0056 05/23/23 0432 05/23/23 0748 05/23/23 1135  BP: 94/71 129/88 109/89 110/75  Pulse: 78 90  90  Resp: 18 20 20    Temp: 98.4 F (36.9 C) 98.7 F (37.1 C) 98.6 F (37 C) 98.5 F (36.9 C)  TempSrc: Oral Oral Oral Oral  SpO2: 95% 97%  96%  Weight:  101.9 kg    Height:       Neurology awake and alert ENT with no pallor Cardiovascular with S1 and S2 present and regular with no gallops or rubs, positive systolic murmur at the base Respiratory with no rales or wheezing  Abdomen with no distention  No lower extremity edema  Data Reviewed:    Family Communication: no family at the bedside   Disposition: Status is: Inpatient Remains inpatient appropriate because: pending AVR   Planned Discharge Destination: Home     Author: Elidia Toribio Furnace, MD 05/23/2023 1:53 PM  For on call review [email or web address].

## 2023-05-23 NOTE — H&P (View-Only) (Signed)
 5 Days Post-Op Procedure(s) (LRB): RIGHT HEART CATH AND CORONARY ANGIOGRAPHY (N/A) Subjective:  No complaints. Ambulated a lot yesterday. No SOB and chest discomfort.  Objective: Vital signs in last 24 hours: Temp:  [98 F (36.7 C)-98.7 F (37.1 C)] 98.5 F (36.9 C) (02/08 1135) Pulse Rate:  [66-90] 90 (02/08 1135) Cardiac Rhythm: Heart block (02/07 1925) Resp:  [18-20] 20 (02/08 0748) BP: (94-129)/(71-89) 110/75 (02/08 1135) SpO2:  [95 %-97 %] 96 % (02/08 1135) Weight:  [101.9 kg] 101.9 kg (02/08 0432)  Hemodynamic parameters for last 24 hours:    Intake/Output from previous day: 02/07 0701 - 02/08 0700 In: 840 [P.O.:840] Out: 2175 [Urine:2175] Intake/Output this shift: Total I/O In: 120 [P.O.:120] Out: 900 [Urine:900]  General appearance: alert and cooperative Neurologic: intact Heart: regular rate and rhythm, 3/6 systolic murmur Lungs: clear to auscultation bilaterally Extremities: no edema  Lab Results: No results for input(s): WBC, HGB, HCT, PLT in the last 72 hours. BMET:  Recent Labs    05/22/23 0254 05/23/23 0237  NA 137 139  K 3.9 4.2  CL 102 101  CO2 25 27  GLUCOSE 132* 132*  BUN 15 15  CREATININE 0.85 1.06  CALCIUM  8.5* 8.7*    PT/INR: No results for input(s): LABPROT, INR in the last 72 hours. ABG    Component Value Date/Time   PHART 7.391 05/18/2023 1128   HCO3 31.6 (H) 05/18/2023 1128   TCO2 33 (H) 05/18/2023 1128   ACIDBASEDEF 1.0 12/29/2016 1343   O2SAT 84 05/18/2023 1128   CBG (last 3)  No results for input(s): GLUCAP in the last 72 hours.  Assessment/Plan:  Severe aortic stnosis. Plan AVR on Monday am. He has no further questions.   LOS: 8 days    Dorise MARLA Fellers 05/23/2023

## 2023-05-23 NOTE — Plan of Care (Signed)

## 2023-05-23 NOTE — Progress Notes (Signed)
 5 Days Post-Op Procedure(s) (LRB): RIGHT HEART CATH AND CORONARY ANGIOGRAPHY (N/A) Subjective:  No complaints. Ambulated a lot yesterday. No SOB and chest discomfort.  Objective: Vital signs in last 24 hours: Temp:  [98 F (36.7 C)-98.7 F (37.1 C)] 98.5 F (36.9 C) (02/08 1135) Pulse Rate:  [66-90] 90 (02/08 1135) Cardiac Rhythm: Heart block (02/07 1925) Resp:  [18-20] 20 (02/08 0748) BP: (94-129)/(71-89) 110/75 (02/08 1135) SpO2:  [95 %-97 %] 96 % (02/08 1135) Weight:  [101.9 kg] 101.9 kg (02/08 0432)  Hemodynamic parameters for last 24 hours:    Intake/Output from previous day: 02/07 0701 - 02/08 0700 In: 840 [P.O.:840] Out: 2175 [Urine:2175] Intake/Output this shift: Total I/O In: 120 [P.O.:120] Out: 900 [Urine:900]  General appearance: alert and cooperative Neurologic: intact Heart: regular rate and rhythm, 3/6 systolic murmur Lungs: clear to auscultation bilaterally Extremities: no edema  Lab Results: No results for input(s): WBC, HGB, HCT, PLT in the last 72 hours. BMET:  Recent Labs    05/22/23 0254 05/23/23 0237  NA 137 139  K 3.9 4.2  CL 102 101  CO2 25 27  GLUCOSE 132* 132*  BUN 15 15  CREATININE 0.85 1.06  CALCIUM  8.5* 8.7*    PT/INR: No results for input(s): LABPROT, INR in the last 72 hours. ABG    Component Value Date/Time   PHART 7.391 05/18/2023 1128   HCO3 31.6 (H) 05/18/2023 1128   TCO2 33 (H) 05/18/2023 1128   ACIDBASEDEF 1.0 12/29/2016 1343   O2SAT 84 05/18/2023 1128   CBG (last 3)  No results for input(s): GLUCAP in the last 72 hours.  Assessment/Plan:  Severe aortic stnosis. Plan AVR on Monday am. He has no further questions.   LOS: 8 days    Dorise MARLA Fellers 05/23/2023

## 2023-05-24 DIAGNOSIS — I472 Ventricular tachycardia, unspecified: Secondary | ICD-10-CM | POA: Diagnosis not present

## 2023-05-24 DIAGNOSIS — I442 Atrioventricular block, complete: Secondary | ICD-10-CM | POA: Diagnosis not present

## 2023-05-24 DIAGNOSIS — E785 Hyperlipidemia, unspecified: Secondary | ICD-10-CM | POA: Diagnosis not present

## 2023-05-24 DIAGNOSIS — E669 Obesity, unspecified: Secondary | ICD-10-CM | POA: Diagnosis not present

## 2023-05-24 DIAGNOSIS — Q2381 Bicuspid aortic valve: Secondary | ICD-10-CM | POA: Diagnosis not present

## 2023-05-24 DIAGNOSIS — Z1152 Encounter for screening for COVID-19: Secondary | ICD-10-CM | POA: Diagnosis not present

## 2023-05-24 DIAGNOSIS — I110 Hypertensive heart disease with heart failure: Secondary | ICD-10-CM | POA: Diagnosis not present

## 2023-05-24 DIAGNOSIS — I5023 Acute on chronic systolic (congestive) heart failure: Secondary | ICD-10-CM | POA: Diagnosis not present

## 2023-05-24 DIAGNOSIS — Z6831 Body mass index (BMI) 31.0-31.9, adult: Secondary | ICD-10-CM | POA: Diagnosis not present

## 2023-05-24 DIAGNOSIS — I4891 Unspecified atrial fibrillation: Secondary | ICD-10-CM | POA: Diagnosis not present

## 2023-05-24 DIAGNOSIS — T82855A Stenosis of coronary artery stent, initial encounter: Secondary | ICD-10-CM | POA: Diagnosis not present

## 2023-05-24 DIAGNOSIS — I5043 Acute on chronic combined systolic (congestive) and diastolic (congestive) heart failure: Secondary | ICD-10-CM | POA: Diagnosis not present

## 2023-05-24 DIAGNOSIS — I10 Essential (primary) hypertension: Secondary | ICD-10-CM | POA: Diagnosis not present

## 2023-05-24 DIAGNOSIS — R7303 Prediabetes: Secondary | ICD-10-CM | POA: Diagnosis not present

## 2023-05-24 DIAGNOSIS — I2510 Atherosclerotic heart disease of native coronary artery without angina pectoris: Secondary | ICD-10-CM | POA: Diagnosis not present

## 2023-05-24 LAB — URINALYSIS, ROUTINE W REFLEX MICROSCOPIC
Bilirubin Urine: NEGATIVE
Glucose, UA: NEGATIVE mg/dL
Hgb urine dipstick: NEGATIVE
Ketones, ur: NEGATIVE mg/dL
Leukocytes,Ua: NEGATIVE
Nitrite: NEGATIVE
Protein, ur: NEGATIVE mg/dL
Specific Gravity, Urine: 1.005 (ref 1.005–1.030)
pH: 7 (ref 5.0–8.0)

## 2023-05-24 LAB — SURGICAL PCR SCREEN
MRSA, PCR: NEGATIVE
Staphylococcus aureus: NEGATIVE

## 2023-05-24 LAB — TYPE AND SCREEN
ABO/RH(D): O POS
Antibody Screen: NEGATIVE

## 2023-05-24 LAB — BLOOD GAS, ARTERIAL
Acid-Base Excess: 1 mmol/L (ref 0.0–2.0)
Bicarbonate: 25.2 mmol/L (ref 20.0–28.0)
O2 Saturation: 98.1 %
Patient temperature: 37
pCO2 arterial: 38 mm[Hg] (ref 32–48)
pH, Arterial: 7.43 (ref 7.35–7.45)
pO2, Arterial: 83 mm[Hg] (ref 83–108)

## 2023-05-24 MED ORDER — METOPROLOL TARTRATE 12.5 MG HALF TABLET
12.5000 mg | ORAL_TABLET | Freq: Once | ORAL | Status: AC
  Administered 2023-05-25: 12.5 mg via ORAL
  Filled 2023-05-24: qty 1

## 2023-05-24 MED ORDER — BISACODYL 5 MG PO TBEC: 5.0000 mg | DELAYED_RELEASE_TABLET | Freq: Once | ORAL | Status: DC

## 2023-05-24 MED ORDER — CHLORHEXIDINE GLUCONATE 0.12 % MT SOLN
15.0000 mL | Freq: Once | OROMUCOSAL | Status: AC
  Administered 2023-05-25: 15 mL via OROMUCOSAL
  Filled 2023-05-24: qty 15

## 2023-05-24 MED ORDER — TEMAZEPAM 7.5 MG PO CAPS: 15.0000 mg | ORAL_CAPSULE | Freq: Once | ORAL | Status: DC | PRN

## 2023-05-24 MED ORDER — CHLORHEXIDINE GLUCONATE CLOTH 2 % EX PADS
6.0000 | MEDICATED_PAD | Freq: Once | CUTANEOUS | Status: AC
  Administered 2023-05-24: 6 via TOPICAL

## 2023-05-24 MED ORDER — DIAZEPAM 5 MG PO TABS
5.0000 mg | ORAL_TABLET | Freq: Once | ORAL | Status: AC
  Administered 2023-05-25: 5 mg via ORAL
  Filled 2023-05-24: qty 1

## 2023-05-24 MED ORDER — CHLORHEXIDINE GLUCONATE CLOTH 2 % EX PADS
6.0000 | MEDICATED_PAD | Freq: Once | CUTANEOUS | Status: AC
  Administered 2023-05-25: 6 via TOPICAL

## 2023-05-24 NOTE — Anesthesia Preprocedure Evaluation (Addendum)
 Anesthesia Evaluation  Patient identified by MRN, date of birth, ID band Patient awake    Reviewed: Allergy & Precautions, H&P , NPO status , Patient's Chart, lab work & pertinent test results  Airway Mallampati: III  TM Distance: >3 FB Neck ROM: Full    Dental no notable dental hx. (+) Teeth Intact, Dental Advisory Given   Pulmonary neg pulmonary ROS, former smoker   Pulmonary exam normal breath sounds clear to auscultation       Cardiovascular Exercise Tolerance: Good hypertension, Pt. on medications and Pt. on home beta blockers + CAD and +CHF  + Valvular Problems/Murmurs AS  Rhythm:Regular Rate:Normal + Systolic murmurs    Neuro/Psych negative neurological ROS  negative psych ROS   GI/Hepatic negative GI ROS, Neg liver ROS,,,  Endo/Other  negative endocrine ROS    Renal/GU negative Renal ROS  negative genitourinary   Musculoskeletal  (+) Arthritis , Osteoarthritis,    Abdominal   Peds  Hematology negative hematology ROS (+)   Anesthesia Other Findings   Reproductive/Obstetrics negative OB ROS                             Anesthesia Physical Anesthesia Plan  ASA: 4  Anesthesia Plan: General   Post-op Pain Management: Tylenol  PO (pre-op)*   Induction: Intravenous  PONV Risk Score and Plan: 2 and Ondansetron  and Midazolam   Airway Management Planned: Oral ETT  Additional Equipment: Arterial line, CVP, PA Cath, TEE and Ultrasound Guidance Line Placement  Intra-op Plan:   Post-operative Plan: Post-operative intubation/ventilation  Informed Consent: I have reviewed the patients History and Physical, chart, labs and discussed the procedure including the risks, benefits and alternatives for the proposed anesthesia with the patient or authorized representative who has indicated his/her understanding and acceptance.     Dental advisory given  Plan Discussed with:  CRNA  Anesthesia Plan Comments:        Anesthesia Quick Evaluation

## 2023-05-24 NOTE — Progress Notes (Signed)
  Progress Note   Patient: Joshua [name] FMW:[id number] DOB: 1960-07-18 DOA: 05/15/2023     9 DOS: the patient was seen and examined on 05/24/2023   Brief hospital course: Mr. Nicoson was admitted to the hospital with the working diagnosis of acute heart failure decompensation in the setting of severe aortic stenosis.   62/M with history of CAD, prior PCI and stenting of LAD and RCA in 2018, recently diagnosed severe aortic stenosis, seen by structural heart team in November, however was asymptomatic from this standpoint and recommended follow-up.  Presented to the ED 1/31 with progressive shortness of breath, orthopnea and edema, symptoms started a week ago, significantly worse the day of admission.  In the ER he was hypoxic to 70%, placed on a nonrebreather and then BiPAP.  Workup noted BNP 2270, troponin 34, chest x-ray with pulmonary vascular congestion and small left pleural effusion. -Right and left heart cath with minimal nonobstructive CAD, elevated filling pressures, depressed cardiac output/index -Thoracentesis on the right, 1.3 L drained -Plan for AVR on Monday  Assessment and Plan: * Acute on chronic systolic CHF (congestive heart failure) (HCC) Echocardiogram with reduced LV systolic function to EF 30 to 64%, LV with hypokinesis, mild LVH, RV systolic function low normal, LA with moderate dilatation, RA with mild dilatation, severe aortic valve stenosis.   Clinically euvlemic.   Plan diuresis with furosemide  40 mg po daily.  Plan for TAVR on Monday.   Coronary artery disease involving native coronary artery of native heart without angina pectoris 2018 PCI with stent LAD and RCA.  Plan to continue aspirin , carvedilol  on hold due to low cardiac output.   Complete heart block [facility]) Patient has asymptomatic events at night.  Discontinue carvedilol .  Will need outpatient sleep study.   Dyslipidemia Continue statin   Essential hypertension Continue blood pressure  monitoring.  Off antihypertensive meds due to risk of hypotension.   Borderline type 2 diabetes mellitus Hgb A1c 6,0       Subjective: Patient is feeling better, continue walking in the hallway with no dyspnea, no chest pain. Denies any PND or orthopnea   Physical Exam: Vitals:   05/23/23 1920 05/24/23 0007 05/24/23 0507 05/24/23 0740  BP: 107/85 (!) 116/97 109/86 (!) 112/90  Pulse:  76  91  Resp: (!) 21 15 14 18   Temp: 97.9 F (36.6 C) 97.9 F (36.6 C) 98.6 F (37 C) 97.8 F (36.6 C)  TempSrc: Oral Oral Oral Oral  SpO2: 93% 100% 100% 100%  Weight:   101.3 kg   Height:       Neurology awake and alert ENT with mild pallor Cardiovascular with S1 and S2 present and regular, positive systolic murmur at the base Respiratory with no rales or wheezing Abdomen with no distention  No lower extremity edema  Data Reviewed:    Family Communication: no family at the bedside   Disposition: Status is: Inpatient Remains inpatient appropriate because: pending aortic valve replacement   Planned Discharge Destination: Home     Author: Elidia Toribio Furnace, MD 05/24/2023 10:04 AM  For on call review [email or web address].

## 2023-05-24 NOTE — Plan of Care (Signed)
  Problem: Nutrition: Goal: Adequate nutrition will be maintained Outcome: Completed/Met   Problem: Coping: Goal: Level of anxiety will decrease Outcome: Completed/Met   Problem: Elimination: Goal: Will not experience complications related to bowel motility Outcome: Completed/Met Goal: Will not experience complications related to urinary retention Outcome: Completed/Met   Problem: Pain Managment: Goal: General experience of comfort will improve and/or be controlled Outcome: Completed/Met   Problem: Safety: Goal: Ability to remain free from injury will improve Outcome: Completed/Met   Problem: Activity: Goal: Ability to return to baseline activity level will improve Outcome: Completed/Met

## 2023-05-24 NOTE — Plan of Care (Signed)

## 2023-05-25 ENCOUNTER — Inpatient Hospital Stay (HOSPITAL_COMMUNITY): Payer: Self-pay | Admitting: Anesthesiology

## 2023-05-25 ENCOUNTER — Other Ambulatory Visit: Payer: Self-pay

## 2023-05-25 ENCOUNTER — Inpatient Hospital Stay (HOSPITAL_COMMUNITY): Payer: Medicaid Other

## 2023-05-25 ENCOUNTER — Encounter (HOSPITAL_COMMUNITY): Payer: Self-pay | Admitting: Internal Medicine

## 2023-05-25 ENCOUNTER — Encounter (HOSPITAL_COMMUNITY)
Admission: EM | Disposition: A | Discharge status: Home / Self Care | Payer: Self-pay | Source: Home / Self Care | Attending: Internal Medicine

## 2023-05-25 DIAGNOSIS — I110 Hypertensive heart disease with heart failure: Secondary | ICD-10-CM | POA: Diagnosis not present

## 2023-05-25 DIAGNOSIS — Q2381 Bicuspid aortic valve: Secondary | ICD-10-CM | POA: Diagnosis not present

## 2023-05-25 DIAGNOSIS — I2510 Atherosclerotic heart disease of native coronary artery without angina pectoris: Secondary | ICD-10-CM

## 2023-05-25 DIAGNOSIS — I4891 Unspecified atrial fibrillation: Secondary | ICD-10-CM | POA: Diagnosis not present

## 2023-05-25 DIAGNOSIS — Z1152 Encounter for screening for COVID-19: Secondary | ICD-10-CM | POA: Diagnosis not present

## 2023-05-25 DIAGNOSIS — I350 Nonrheumatic aortic (valve) stenosis: Secondary | ICD-10-CM

## 2023-05-25 DIAGNOSIS — Z6831 Body mass index (BMI) 31.0-31.9, adult: Secondary | ICD-10-CM | POA: Diagnosis not present

## 2023-05-25 DIAGNOSIS — I5023 Acute on chronic systolic (congestive) heart failure: Secondary | ICD-10-CM | POA: Diagnosis not present

## 2023-05-25 DIAGNOSIS — I442 Atrioventricular block, complete: Secondary | ICD-10-CM | POA: Diagnosis not present

## 2023-05-25 DIAGNOSIS — Z953 Presence of xenogenic heart valve: Secondary | ICD-10-CM

## 2023-05-25 DIAGNOSIS — E669 Obesity, unspecified: Secondary | ICD-10-CM | POA: Diagnosis not present

## 2023-05-25 DIAGNOSIS — I5032 Chronic diastolic (congestive) heart failure: Secondary | ICD-10-CM

## 2023-05-25 DIAGNOSIS — I472 Ventricular tachycardia, unspecified: Secondary | ICD-10-CM | POA: Diagnosis not present

## 2023-05-25 DIAGNOSIS — R7303 Prediabetes: Secondary | ICD-10-CM | POA: Diagnosis not present

## 2023-05-25 DIAGNOSIS — I5043 Acute on chronic combined systolic (congestive) and diastolic (congestive) heart failure: Secondary | ICD-10-CM | POA: Diagnosis not present

## 2023-05-25 DIAGNOSIS — I358 Other nonrheumatic aortic valve disorders: Secondary | ICD-10-CM | POA: Diagnosis not present

## 2023-05-25 DIAGNOSIS — T82855A Stenosis of coronary artery stent, initial encounter: Secondary | ICD-10-CM | POA: Diagnosis not present

## 2023-05-25 HISTORY — PX: TEE WITHOUT CARDIOVERSION: SHX5443

## 2023-05-25 HISTORY — PX: AORTIC VALVE REPLACEMENT: SHX41

## 2023-05-25 LAB — POCT I-STAT, CHEM 8
BUN: 15 mg/dL (ref 8–23)
BUN: 14 mg/dL (ref 8–23)
BUN: 15 mg/dL (ref 8–23)
BUN: 13 mg/dL (ref 8–23)
BUN: 14 mg/dL (ref 8–23)
Calcium, Ion: 1.23 mmol/L (ref 1.15–1.40)
Calcium, Ion: 1 mmol/L — ABNORMAL LOW (ref 1.15–1.40)
Calcium, Ion: 1.2 mmol/L (ref 1.15–1.40)
Calcium, Ion: 1.05 mmol/L — ABNORMAL LOW (ref 1.15–1.40)
Calcium, Ion: 1.28 mmol/L (ref 1.15–1.40)
Chloride: 105 mmol/L (ref 98–111)
Chloride: 102 mmol/L (ref 98–111)
Chloride: 105 mmol/L (ref 98–111)
Chloride: 106 mmol/L (ref 98–111)
Chloride: 107 mmol/L (ref 98–111)
Creatinine, Ser: 0.9 mg/dL (ref 0.61–1.24)
Creatinine, Ser: 0.8 mg/dL (ref 0.61–1.24)
Creatinine, Ser: 0.9 mg/dL (ref 0.61–1.24)
Creatinine, Ser: 0.8 mg/dL (ref 0.61–1.24)
Creatinine, Ser: 0.8 mg/dL (ref 0.61–1.24)
Glucose, Bld: 117 mg/dL — ABNORMAL HIGH (ref 70–99)
Glucose, Bld: 121 mg/dL — ABNORMAL HIGH (ref 70–99)
Glucose, Bld: 137 mg/dL — ABNORMAL HIGH (ref 70–99)
Glucose, Bld: 157 mg/dL — ABNORMAL HIGH (ref 70–99)
Glucose, Bld: 161 mg/dL — ABNORMAL HIGH (ref 70–99)
HCT: 47 % (ref 39.0–52.0)
HCT: 35 % — ABNORMAL LOW (ref 39.0–52.0)
HCT: 43 % (ref 39.0–52.0)
HCT: 39 % (ref 39.0–52.0)
HCT: 39 % (ref 39.0–52.0)
Hemoglobin: 16 g/dL (ref 13.0–17.0)
Hemoglobin: 11.9 g/dL — ABNORMAL LOW (ref 13.0–17.0)
Hemoglobin: 14.6 g/dL (ref 13.0–17.0)
Hemoglobin: 13.3 g/dL (ref 13.0–17.0)
Hemoglobin: 13.3 g/dL (ref 13.0–17.0)
Potassium: 4 mmol/L (ref 3.5–5.1)
Potassium: 4.3 mmol/L (ref 3.5–5.1)
Potassium: 4.6 mmol/L (ref 3.5–5.1)
Potassium: 4.1 mmol/L (ref 3.5–5.1)
Potassium: 4.8 mmol/L (ref 3.5–5.1)
Sodium: 142 mmol/L (ref 135–145)
Sodium: 140 mmol/L (ref 135–145)
Sodium: 140 mmol/L (ref 135–145)
Sodium: 138 mmol/L (ref 135–145)
Sodium: 140 mmol/L (ref 135–145)
TCO2: 30 mmol/L (ref 22–32)
TCO2: 22 mmol/L (ref 22–32)
TCO2: 25 mmol/L (ref 22–32)
TCO2: 23 mmol/L (ref 22–32)
TCO2: 24 mmol/L (ref 22–32)

## 2023-05-25 LAB — POCT I-STAT 7, (LYTES, BLD GAS, ICA,H+H)
Acid-base deficit: 1 mmol/L (ref 0.0–2.0)
Acid-base deficit: 3 mmol/L — ABNORMAL HIGH (ref 0.0–2.0)
Acid-base deficit: 1 mmol/L (ref 0.0–2.0)
Acid-base deficit: 3 mmol/L — ABNORMAL HIGH (ref 0.0–2.0)
Acid-base deficit: 4 mmol/L — ABNORMAL HIGH (ref 0.0–2.0)
Acid-base deficit: 5 mmol/L — ABNORMAL HIGH (ref 0.0–2.0)
Acid-base deficit: 4 mmol/L — ABNORMAL HIGH (ref 0.0–2.0)
Bicarbonate: 24.9 mmol/L (ref 20.0–28.0)
Bicarbonate: 22.7 mmol/L (ref 20.0–28.0)
Bicarbonate: 24.2 mmol/L (ref 20.0–28.0)
Bicarbonate: 22.7 mmol/L (ref 20.0–28.0)
Bicarbonate: 22.3 mmol/L (ref 20.0–28.0)
Bicarbonate: 21.9 mmol/L (ref 20.0–28.0)
Bicarbonate: 22.8 mmol/L (ref 20.0–28.0)
Calcium, Ion: 1.19 mmol/L (ref 1.15–1.40)
Calcium, Ion: 1.02 mmol/L — ABNORMAL LOW (ref 1.15–1.40)
Calcium, Ion: 1.07 mmol/L — ABNORMAL LOW (ref 1.15–1.40)
Calcium, Ion: 1.29 mmol/L (ref 1.15–1.40)
Calcium, Ion: 1.2 mmol/L (ref 1.15–1.40)
Calcium, Ion: 1.18 mmol/L (ref 1.15–1.40)
Calcium, Ion: 1.16 mmol/L (ref 1.15–1.40)
HCT: 45 % (ref 39.0–52.0)
HCT: 35 % — ABNORMAL LOW (ref 39.0–52.0)
HCT: 43 % (ref 39.0–52.0)
HCT: 38 % — ABNORMAL LOW (ref 39.0–52.0)
HCT: 40 % (ref 39.0–52.0)
HCT: 38 % — ABNORMAL LOW (ref 39.0–52.0)
HCT: 39 % (ref 39.0–52.0)
Hemoglobin: 15.3 g/dL (ref 13.0–17.0)
Hemoglobin: 11.9 g/dL — ABNORMAL LOW (ref 13.0–17.0)
Hemoglobin: 14.6 g/dL (ref 13.0–17.0)
Hemoglobin: 12.9 g/dL — ABNORMAL LOW (ref 13.0–17.0)
Hemoglobin: 13.6 g/dL (ref 13.0–17.0)
Hemoglobin: 12.9 g/dL — ABNORMAL LOW (ref 13.0–17.0)
Hemoglobin: 13.3 g/dL (ref 13.0–17.0)
O2 Saturation: 100 %
O2 Saturation: 100 %
O2 Saturation: 100 %
O2 Saturation: 100 %
O2 Saturation: 91 %
O2 Saturation: 92 %
O2 Saturation: 91 %
Patient temperature: 36.5
Patient temperature: 36.7
Patient temperature: 36.7
Potassium: 3.9 mmol/L (ref 3.5–5.1)
Potassium: 4.5 mmol/L (ref 3.5–5.1)
Potassium: 4.8 mmol/L (ref 3.5–5.1)
Potassium: 4.1 mmol/L (ref 3.5–5.1)
Potassium: 4 mmol/L (ref 3.5–5.1)
Potassium: 3.9 mmol/L (ref 3.5–5.1)
Potassium: 4.2 mmol/L (ref 3.5–5.1)
Sodium: 142 mmol/L (ref 135–145)
Sodium: 140 mmol/L (ref 135–145)
Sodium: 138 mmol/L (ref 135–145)
Sodium: 140 mmol/L (ref 135–145)
Sodium: 141 mmol/L (ref 135–145)
Sodium: 141 mmol/L (ref 135–145)
Sodium: 140 mmol/L (ref 135–145)
TCO2: 26 mmol/L (ref 22–32)
TCO2: 24 mmol/L (ref 22–32)
TCO2: 25 mmol/L (ref 22–32)
TCO2: 24 mmol/L (ref 22–32)
TCO2: 24 mmol/L (ref 22–32)
TCO2: 23 mmol/L (ref 22–32)
TCO2: 24 mmol/L (ref 22–32)
pCO2 arterial: 45.9 mm[Hg] (ref 32–48)
pCO2 arterial: 41.7 mm[Hg] (ref 32–48)
pCO2 arterial: 39.6 mm[Hg] (ref 32–48)
pCO2 arterial: 42.2 mm[Hg] (ref 32–48)
pCO2 arterial: 44 mm[Hg] (ref 32–48)
pCO2 arterial: 46 mm[Hg] (ref 32–48)
pCO2 arterial: 44.6 mm[Hg] (ref 32–48)
pH, Arterial: 7.341 — ABNORMAL LOW (ref 7.35–7.45)
pH, Arterial: 7.343 — ABNORMAL LOW (ref 7.35–7.45)
pH, Arterial: 7.394 (ref 7.35–7.45)
pH, Arterial: 7.339 — ABNORMAL LOW (ref 7.35–7.45)
pH, Arterial: 7.309 — ABNORMAL LOW (ref 7.35–7.45)
pH, Arterial: 7.284 — ABNORMAL LOW (ref 7.35–7.45)
pH, Arterial: 7.316 — ABNORMAL LOW (ref 7.35–7.45)
pO2, Arterial: 399 mm[Hg] — ABNORMAL HIGH (ref 83–108)
pO2, Arterial: 357 mm[Hg] — ABNORMAL HIGH (ref 83–108)
pO2, Arterial: 383 mm[Hg] — ABNORMAL HIGH (ref 83–108)
pO2, Arterial: 334 mm[Hg] — ABNORMAL HIGH (ref 83–108)
pO2, Arterial: 64 mm[Hg] — ABNORMAL LOW (ref 83–108)
pO2, Arterial: 72 mm[Hg] — ABNORMAL LOW (ref 83–108)
pO2, Arterial: 65 mm[Hg] — ABNORMAL LOW (ref 83–108)

## 2023-05-25 LAB — CBC
HCT: 50.5 % (ref 39.0–52.0)
HCT: 40.8 % (ref 39.0–52.0)
HCT: 38.4 % — ABNORMAL LOW (ref 39.0–52.0)
Hemoglobin: 16.3 g/dL (ref 13.0–17.0)
Hemoglobin: 13.5 g/dL (ref 13.0–17.0)
Hemoglobin: 12.4 g/dL — ABNORMAL LOW (ref 13.0–17.0)
MCH: 29.7 pg (ref 26.0–34.0)
MCH: 30.3 pg (ref 26.0–34.0)
MCH: 30 pg (ref 26.0–34.0)
MCHC: 32.3 g/dL (ref 30.0–36.0)
MCHC: 33.1 g/dL (ref 30.0–36.0)
MCHC: 32.3 g/dL (ref 30.0–36.0)
MCV: 92.2 fL (ref 80.0–100.0)
MCV: 91.7 fL (ref 80.0–100.0)
MCV: 93 fL (ref 80.0–100.0)
Platelets: 173 10*3/uL (ref 150–400)
Platelets: 143 10*3/uL — ABNORMAL LOW (ref 150–400)
Platelets: 125 10*3/uL — ABNORMAL LOW (ref 150–400)
RBC: 5.48 MIL/uL (ref 4.22–5.81)
RBC: 4.45 MIL/uL (ref 4.22–5.81)
RBC: 4.13 MIL/uL — ABNORMAL LOW (ref 4.22–5.81)
RDW: 13.1 % (ref 11.5–15.5)
RDW: 13.2 % (ref 11.5–15.5)
RDW: 13.2 % (ref 11.5–15.5)
WBC: 5.7 10*3/uL (ref 4.0–10.5)
WBC: 14.5 10*3/uL — ABNORMAL HIGH (ref 4.0–10.5)
WBC: 13.9 10*3/uL — ABNORMAL HIGH (ref 4.0–10.5)
nRBC: 0 % (ref 0.0–0.2)
nRBC: 0 % (ref 0.0–0.2)
nRBC: 0 % (ref 0.0–0.2)

## 2023-05-25 LAB — GLUCOSE, CAPILLARY
Glucose-Capillary: 187 mg/dL — ABNORMAL HIGH (ref 70–99)
Glucose-Capillary: 179 mg/dL — ABNORMAL HIGH (ref 70–99)
Glucose-Capillary: 166 mg/dL — ABNORMAL HIGH (ref 70–99)
Glucose-Capillary: 188 mg/dL — ABNORMAL HIGH (ref 70–99)
Glucose-Capillary: 171 mg/dL — ABNORMAL HIGH (ref 70–99)
Glucose-Capillary: 204 mg/dL — ABNORMAL HIGH (ref 70–99)
Glucose-Capillary: 178 mg/dL — ABNORMAL HIGH (ref 70–99)
Glucose-Capillary: 205 mg/dL — ABNORMAL HIGH (ref 70–99)
Glucose-Capillary: 229 mg/dL — ABNORMAL HIGH (ref 70–99)
Glucose-Capillary: 164 mg/dL — ABNORMAL HIGH (ref 70–99)

## 2023-05-25 LAB — ECHO INTRAOPERATIVE TEE
AR max vel: 0.78 cm2
AV Area VTI: 0.64 cm2
AV Area mean vel: 0.78 cm2
AV Mean grad: 22.5 mm[Hg]
AV Peak grad: 32.8 mm[Hg]
Ao pk vel: 2.87 m/s
Height: 73 in
P 1/2 time: 500 ms
Weight: 3777.8 [oz_av]

## 2023-05-25 LAB — COMPREHENSIVE METABOLIC PANEL
ALT: 36 U/L (ref 0–44)
AST: 25 U/L (ref 15–41)
Albumin: 3.5 g/dL (ref 3.5–5.0)
Alkaline Phosphatase: 73 U/L (ref 38–126)
Anion gap: 13 (ref 5–15)
BUN: 15 mg/dL (ref 8–23)
CO2: 24 mmol/L (ref 22–32)
Calcium: 9.4 mg/dL (ref 8.9–10.3)
Chloride: 103 mmol/L (ref 98–111)
Creatinine, Ser: 1.05 mg/dL (ref 0.61–1.24)
GFR, Estimated: >60 mL/min (ref >60)
Glucose, Bld: 127 mg/dL — ABNORMAL HIGH (ref 70–99)
Potassium: 4.2 mmol/L (ref 3.5–5.1)
Sodium: 140 mmol/L (ref 135–145)
Total Bilirubin: 0.9 mg/dL (ref 0.0–1.2)
Total Protein: 6.8 g/dL (ref 6.5–8.1)

## 2023-05-25 LAB — MAGNESIUM
Magnesium: 2 mg/dL (ref 1.7–2.4)
Magnesium: 2.8 mg/dL — ABNORMAL HIGH (ref 1.7–2.4)

## 2023-05-25 LAB — HEMOGLOBIN A1C
Hgb A1c MFr Bld: 6 % — ABNORMAL HIGH (ref 4.8–5.6)
Mean Plasma Glucose: 125.5 mg/dL

## 2023-05-25 LAB — BASIC METABOLIC PANEL
Anion gap: 10 (ref 5–15)
BUN: 11 mg/dL (ref 8–23)
CO2: 22 mmol/L (ref 22–32)
Calcium: 7.7 mg/dL — ABNORMAL LOW (ref 8.9–10.3)
Chloride: 103 mmol/L (ref 98–111)
Creatinine, Ser: 0.91 mg/dL (ref 0.61–1.24)
GFR, Estimated: >60 mL/min (ref >60)
Glucose, Bld: 198 mg/dL — ABNORMAL HIGH (ref 70–99)
Potassium: 4.4 mmol/L (ref 3.5–5.1)
Sodium: 135 mmol/L (ref 135–145)

## 2023-05-25 LAB — PROTIME-INR
INR: 1 (ref 0.8–1.2)
INR: 1.4 — ABNORMAL HIGH (ref 0.8–1.2)
Prothrombin Time: 13.7 s (ref 11.4–15.2)
Prothrombin Time: 17.5 s — ABNORMAL HIGH (ref 11.4–15.2)

## 2023-05-25 LAB — PLATELET COUNT: Platelets: 133 10*3/uL — ABNORMAL LOW (ref 150–400)

## 2023-05-25 LAB — HEMOGLOBIN AND HEMATOCRIT, BLOOD
HCT: 39.5 % (ref 39.0–52.0)
Hemoglobin: 12.9 g/dL — ABNORMAL LOW (ref 13.0–17.0)

## 2023-05-25 LAB — APTT
aPTT: 27 s (ref 24–36)
aPTT: 32 s (ref 24–36)

## 2023-05-25 SURGERY — REPLACEMENT, AORTIC VALVE, OPEN
Anesthesia: General | Site: Chest

## 2023-05-25 MED ORDER — SODIUM BICARBONATE 8.4 % IV SOLN
50.0000 meq | Freq: Once | INTRAVENOUS | Status: AC
  Administered 2023-05-25: 50 meq via INTRAVENOUS

## 2023-05-25 MED ORDER — CEFAZOLIN SODIUM-DEXTROSE 2-4 GM/100ML-% IV SOLN
2.0000 g | Freq: Three times a day (TID) | INTRAVENOUS | Status: AC
  Administered 2023-05-25 – 2023-05-27 (×6): 2 g via INTRAVENOUS
  Filled 2023-05-25 (×6): qty 100

## 2023-05-25 MED ORDER — EPINEPHRINE HCL 5 MG/250ML IV SOLN IN NS: 0.5000 ug/min | INTRAVENOUS | Status: DC

## 2023-05-25 MED ORDER — PHENYLEPHRINE 80 MCG/ML (10ML) SYRINGE FOR IV PUSH (FOR BLOOD PRESSURE SUPPORT)
PREFILLED_SYRINGE | INTRAVENOUS | Status: DC | PRN
  Administered 2023-05-25 (×2): 160 ug via INTRAVENOUS
  Administered 2023-05-25: 80 ug via INTRAVENOUS
  Administered 2023-05-25 (×2): 40 ug via INTRAVENOUS
  Administered 2023-05-25: 160 ug via INTRAVENOUS

## 2023-05-25 MED ORDER — METOPROLOL TARTRATE 12.5 MG HALF TABLET
12.5000 mg | ORAL_TABLET | Freq: Two times a day (BID) | ORAL | Status: DC
  Administered 2023-05-25: 12.5 mg via ORAL
  Filled 2023-05-25: qty 1

## 2023-05-25 MED ORDER — THROMBIN 20000 UNITS EX SOLR: CUTANEOUS | Status: DC | PRN

## 2023-05-25 MED ORDER — LIDOCAINE 2% (20 MG/ML) 5 ML SYRINGE
INTRAMUSCULAR | Status: AC
  Filled 2023-05-25: qty 5

## 2023-05-25 MED ORDER — ARTIFICIAL TEARS OPHTHALMIC OINT
TOPICAL_OINTMENT | OPHTHALMIC | Status: DC | PRN
  Administered 2023-05-25: 1 via OPHTHALMIC

## 2023-05-25 MED ORDER — PANTOPRAZOLE SODIUM 40 MG PO TBEC
40.0000 mg | DELAYED_RELEASE_TABLET | Freq: Every day | ORAL | Status: DC
  Administered 2023-05-27 – 2023-05-29 (×3): 40 mg via ORAL
  Filled 2023-05-25 (×3): qty 1

## 2023-05-25 MED ORDER — LACTATED RINGERS IV SOLN: INTRAVENOUS | Status: DC | PRN

## 2023-05-25 MED ORDER — DEXTROSE 50 % IV SOLN: 0.0000 mL | INTRAVENOUS | Status: DC | PRN

## 2023-05-25 MED ORDER — CHLORHEXIDINE GLUCONATE CLOTH 2 % EX PADS
6.0000 | MEDICATED_PAD | Freq: Every day | CUTANEOUS | Status: DC
  Administered 2023-05-25 – 2023-05-26 (×2): 6 via TOPICAL

## 2023-05-25 MED ORDER — ROCURONIUM BROMIDE 10 MG/ML (PF) SYRINGE
PREFILLED_SYRINGE | INTRAVENOUS | Status: AC
  Filled 2023-05-25: qty 10

## 2023-05-25 MED ORDER — SODIUM CHLORIDE 0.9 % IV SOLN: INTRAVENOUS | Status: DC | PRN

## 2023-05-25 MED ORDER — METOPROLOL TARTRATE 25 MG/10 ML ORAL SUSPENSION: 12.5000 mg | Freq: Two times a day (BID) | ORAL | Status: DC

## 2023-05-25 MED ORDER — DEXMEDETOMIDINE HCL IN NACL 400 MCG/100ML IV SOLN
0.0000 ug/kg/h | INTRAVENOUS | Status: DC
  Administered 2023-05-25: 0.2 ug/kg/h via INTRAVENOUS
  Filled 2023-05-25: qty 100

## 2023-05-25 MED ORDER — 0.9 % SODIUM CHLORIDE (POUR BTL) OPTIME
TOPICAL | Status: DC | PRN
  Administered 2023-05-25: 5000 mL

## 2023-05-25 MED ORDER — FENTANYL CITRATE (PF) 250 MCG/5ML IJ SOLN
INTRAMUSCULAR | Status: AC
  Filled 2023-05-25: qty 5

## 2023-05-25 MED ORDER — ASPIRIN 81 MG PO CHEW
324.0000 mg | CHEWABLE_TABLET | Freq: Every day | ORAL | Status: DC
  Filled 2023-05-25: qty 4

## 2023-05-25 MED ORDER — PROTAMINE SULFATE 10 MG/ML IV SOLN
INTRAVENOUS | Status: DC | PRN
  Administered 2023-05-25: 30 mg via INTRAVENOUS
  Administered 2023-05-25: 320 mg via INTRAVENOUS

## 2023-05-25 MED ORDER — LACTATED RINGERS IV SOLN: INTRAVENOUS | Status: AC

## 2023-05-25 MED ORDER — ASPIRIN 325 MG PO TBEC
325.0000 mg | DELAYED_RELEASE_TABLET | Freq: Every day | ORAL | Status: DC
  Administered 2023-05-26 – 2023-05-27 (×2): 325 mg via ORAL
  Filled 2023-05-25 (×3): qty 1

## 2023-05-25 MED ORDER — CLEVIDIPINE BUTYRATE 0.5 MG/ML IV EMUL
INTRAVENOUS | Status: AC
  Filled 2023-05-25: qty 50

## 2023-05-25 MED ORDER — ACETAMINOPHEN 160 MG/5ML PO SOLN
1000.0000 mg | Freq: Four times a day (QID) | ORAL | Status: DC
Start: 2023-05-26 — End: 2023-05-29

## 2023-05-25 MED ORDER — ARTIFICIAL TEARS OPHTHALMIC OINT
TOPICAL_OINTMENT | OPHTHALMIC | Status: AC
  Filled 2023-05-25: qty 3.5

## 2023-05-25 MED ORDER — MIDAZOLAM HCL (PF) 5 MG/ML IJ SOLN: INTRAMUSCULAR | Status: DC | PRN

## 2023-05-25 MED ORDER — PROPOFOL 10 MG/ML IV BOLUS
INTRAVENOUS | Status: AC
  Filled 2023-05-25: qty 20

## 2023-05-25 MED ORDER — SODIUM CHLORIDE 0.9% FLUSH
3.0000 mL | Freq: Two times a day (BID) | INTRAVENOUS | Status: DC
  Administered 2023-05-25 – 2023-05-26 (×2): 10 mL via INTRAVENOUS

## 2023-05-25 MED ORDER — ALBUMIN HUMAN 5 % IV SOLN: INTRAVENOUS | Status: DC | PRN

## 2023-05-25 MED ORDER — METOPROLOL TARTRATE 5 MG/5ML IV SOLN: 2.5000 mg | INTRAVENOUS | Status: DC | PRN

## 2023-05-25 MED ORDER — SODIUM CHLORIDE 0.9 % IV SOLN: INTRAVENOUS | Status: AC

## 2023-05-25 MED ORDER — PLASMA-LYTE A IV SOLN: INTRAVENOUS | Status: DC | PRN

## 2023-05-25 MED ORDER — ACETAMINOPHEN 500 MG PO TABS
1000.0000 mg | ORAL_TABLET | Freq: Four times a day (QID) | ORAL | Status: DC
  Administered 2023-05-25 – 2023-05-28 (×11): 1000 mg via ORAL
  Filled 2023-05-25 (×11): qty 2

## 2023-05-25 MED ORDER — LACTATED RINGERS IV SOLN: INTRAVENOUS | Status: DC

## 2023-05-25 MED ORDER — NOREPINEPHRINE 4 MG/250ML-% IV SOLN: 0.0000 ug/min | INTRAVENOUS | Status: DC

## 2023-05-25 MED ORDER — TRAMADOL HCL 50 MG PO TABS
50.0000 mg | ORAL_TABLET | ORAL | Status: DC | PRN
  Administered 2023-05-25: 100 mg via ORAL
  Filled 2023-05-25: qty 2

## 2023-05-25 MED ORDER — POTASSIUM CHLORIDE 10 MEQ/50ML IV SOLN: 10.0000 meq | INTRAVENOUS | Status: AC

## 2023-05-25 MED ORDER — SODIUM CHLORIDE 0.9% FLUSH: 3.0000 mL | INTRAVENOUS | Status: DC | PRN

## 2023-05-25 MED ORDER — THROMBIN (RECOMBINANT) 20000 UNITS EX SOLR
CUTANEOUS | Status: AC
  Filled 2023-05-25: qty 20000

## 2023-05-25 MED ORDER — ALBUMIN HUMAN 5 % IV SOLN
250.0000 mL | INTRAVENOUS | Status: DC | PRN
  Administered 2023-05-25 (×2): 12.5 g via INTRAVENOUS

## 2023-05-25 MED ORDER — ORAL CARE MOUTH RINSE: 15.0000 mL | Freq: Once | OROMUCOSAL | Status: DC

## 2023-05-25 MED ORDER — HEPARIN SODIUM (PORCINE) 1000 UNIT/ML IJ SOLN
INTRAMUSCULAR | Status: AC
  Filled 2023-05-25: qty 10

## 2023-05-25 MED ORDER — NITROGLYCERIN IN D5W 200-5 MCG/ML-% IV SOLN
0.0000 ug/min | INTRAVENOUS | Status: DC
Start: 2023-05-25 — End: 2023-05-28

## 2023-05-25 MED ORDER — OXYCODONE HCL 5 MG PO TABS
5.0000 mg | ORAL_TABLET | ORAL | Status: DC | PRN
  Administered 2023-05-25 – 2023-05-26 (×7): 10 mg via ORAL
  Administered 2023-05-26: 5 mg via ORAL
  Administered 2023-05-26 – 2023-05-27 (×4): 10 mg via ORAL
  Administered 2023-05-27 (×2): 5 mg via ORAL
  Administered 2023-05-28 (×4): 10 mg via ORAL
  Filled 2023-05-25 (×2): qty 2
  Filled 2023-05-25: qty 1
  Filled 2023-05-25 (×2): qty 2
  Filled 2023-05-25: qty 1
  Filled 2023-05-25 (×2): qty 2
  Filled 2023-05-25: qty 1
  Filled 2023-05-25: qty 2
  Filled 2023-05-25: qty 1
  Filled 2023-05-25 (×8): qty 2

## 2023-05-25 MED ORDER — MIDAZOLAM HCL 2 MG/2ML IJ SOLN: 2.0000 mg | INTRAMUSCULAR | Status: DC | PRN

## 2023-05-25 MED ORDER — PROTAMINE SULFATE 10 MG/ML IV SOLN
INTRAVENOUS | Status: AC
  Filled 2023-05-25: qty 25

## 2023-05-25 MED ORDER — ACETAMINOPHEN 160 MG/5ML PO SOLN
650.0000 mg | Freq: Once | ORAL | Status: AC
  Administered 2023-05-25: 650 mg
  Filled 2023-05-25: qty 20.3

## 2023-05-25 MED ORDER — HEMOSTATIC AGENTS (NO CHARGE) OPTIME
TOPICAL | Status: DC | PRN
  Administered 2023-05-25: 1 via TOPICAL

## 2023-05-25 MED ORDER — PHENYLEPHRINE HCL-NACL 20-0.9 MG/250ML-% IV SOLN
0.0000 ug/min | INTRAVENOUS | Status: DC
Start: 2023-05-25 — End: 2023-05-25

## 2023-05-25 MED ORDER — HEPARIN SODIUM (PORCINE) 1000 UNIT/ML IJ SOLN
INTRAMUSCULAR | Status: DC | PRN
  Administered 2023-05-25: 37000 [IU] via INTRAVENOUS

## 2023-05-25 MED ORDER — SODIUM CHLORIDE 0.9% FLUSH
3.0000 mL | Freq: Two times a day (BID) | INTRAVENOUS | Status: DC
  Administered 2023-05-25: 3 mL via INTRAVENOUS
  Administered 2023-05-26: 10 mL via INTRAVENOUS

## 2023-05-25 MED ORDER — ONDANSETRON HCL 4 MG/2ML IJ SOLN
INTRAMUSCULAR | Status: AC
  Filled 2023-05-25: qty 4

## 2023-05-25 MED ORDER — ASPIRIN 81 MG PO CHEW
324.0000 mg | CHEWABLE_TABLET | Freq: Once | ORAL | Status: AC
  Administered 2023-05-25: 324 mg via ORAL
  Filled 2023-05-25: qty 4

## 2023-05-25 MED ORDER — METOCLOPRAMIDE HCL 5 MG/ML IJ SOLN
10.0000 mg | Freq: Four times a day (QID) | INTRAMUSCULAR | Status: AC
  Administered 2023-05-25 – 2023-05-26 (×6): 10 mg via INTRAVENOUS
  Filled 2023-05-25 (×6): qty 2

## 2023-05-25 MED ORDER — MIDAZOLAM HCL 2 MG/2ML IJ SOLN
INTRAMUSCULAR | Status: AC
  Filled 2023-05-25: qty 2

## 2023-05-25 MED ORDER — DOCUSATE SODIUM 100 MG PO CAPS
200.0000 mg | ORAL_CAPSULE | Freq: Every day | ORAL | Status: DC
  Administered 2023-05-26 – 2023-05-28 (×3): 200 mg via ORAL
  Filled 2023-05-25 (×4): qty 2

## 2023-05-25 MED ORDER — MORPHINE SULFATE (PF) 2 MG/ML IV SOLN
1.0000 mg | INTRAVENOUS | Status: DC | PRN
  Administered 2023-05-25 (×2): 2 mg via INTRAVENOUS
  Administered 2023-05-25: 4 mg via INTRAVENOUS
  Administered 2023-05-26: 2 mg via INTRAVENOUS
  Administered 2023-05-26: 1 mg via INTRAVENOUS
  Filled 2023-05-25: qty 1
  Filled 2023-05-25: qty 2
  Filled 2023-05-25 (×3): qty 1

## 2023-05-25 MED ORDER — CHLORHEXIDINE GLUCONATE 0.12 % MT SOLN: 15.0000 mL | Freq: Once | OROMUCOSAL | Status: DC

## 2023-05-25 MED ORDER — BISACODYL 5 MG PO TBEC
10.0000 mg | DELAYED_RELEASE_TABLET | Freq: Every day | ORAL | Status: DC
  Administered 2023-05-26: 10 mg via ORAL
  Filled 2023-05-25: qty 2

## 2023-05-25 MED ORDER — ROCURONIUM BROMIDE 10 MG/ML (PF) SYRINGE
PREFILLED_SYRINGE | INTRAVENOUS | Status: DC | PRN
  Administered 2023-05-25 (×3): 100 mg via INTRAVENOUS

## 2023-05-25 MED ORDER — MIDAZOLAM HCL 2 MG/2ML IJ SOLN
INTRAMUSCULAR | Status: DC | PRN
  Administered 2023-05-25 (×2): 2 mg via INTRAVENOUS

## 2023-05-25 MED ORDER — SODIUM CHLORIDE 0.9% FLUSH
3.0000 mL | Freq: Two times a day (BID) | INTRAVENOUS | Status: DC
  Administered 2023-05-26 – 2023-05-28 (×3): 3 mL via INTRAVENOUS

## 2023-05-25 MED ORDER — FENTANYL CITRATE (PF) 250 MCG/5ML IJ SOLN
INTRAMUSCULAR | Status: DC | PRN
  Administered 2023-05-25: 500 ug via INTRAVENOUS
  Administered 2023-05-25: 50 ug via INTRAVENOUS
  Administered 2023-05-25: 100 ug via INTRAVENOUS
  Administered 2023-05-25 (×2): 50 ug via INTRAVENOUS

## 2023-05-25 MED ORDER — INSULIN REGULAR(HUMAN) IN NACL 100-0.9 UT/100ML-% IV SOLN: INTRAVENOUS | Status: DC

## 2023-05-25 MED ORDER — PANTOPRAZOLE SODIUM 40 MG IV SOLR
40.0000 mg | Freq: Every day | INTRAVENOUS | Status: AC
  Administered 2023-05-25 – 2023-05-26 (×2): 40 mg via INTRAVENOUS
  Filled 2023-05-25 (×2): qty 10

## 2023-05-25 MED ORDER — ONDANSETRON HCL 4 MG/2ML IJ SOLN: 4.0000 mg | Freq: Four times a day (QID) | INTRAMUSCULAR | Status: DC | PRN

## 2023-05-25 MED ORDER — PROPOFOL 10 MG/ML IV BOLUS
INTRAVENOUS | Status: DC | PRN
  Administered 2023-05-25: 60 mg via INTRAVENOUS
  Administered 2023-05-25 (×2): 50 mg via INTRAVENOUS
  Administered 2023-05-25: 100 mg via INTRAVENOUS
  Administered 2023-05-25: 40 mg via INTRAVENOUS

## 2023-05-25 MED ORDER — ONDANSETRON HCL 4 MG/2ML IJ SOLN
INTRAMUSCULAR | Status: DC | PRN
  Administered 2023-05-25: 8 mg via INTRAVENOUS

## 2023-05-25 MED ORDER — HEPARIN SODIUM (PORCINE) 1000 UNIT/ML IJ SOLN
INTRAMUSCULAR | Status: AC
  Filled 2023-05-25: qty 1

## 2023-05-25 MED ORDER — BISACODYL 10 MG RE SUPP: 10.0000 mg | Freq: Every day | RECTAL | Status: DC

## 2023-05-25 MED ORDER — CHLORHEXIDINE GLUCONATE 0.12 % MT SOLN
15.0000 mL | OROMUCOSAL | Status: AC
  Administered 2023-05-25: 15 mL via OROMUCOSAL
  Filled 2023-05-25: qty 15

## 2023-05-25 MED ORDER — MAGNESIUM SULFATE 4 GM/100ML IV SOLN
4.0000 g | Freq: Once | INTRAVENOUS | Status: AC
  Administered 2023-05-25: 4 g via INTRAVENOUS
  Filled 2023-05-25: qty 100

## 2023-05-25 MED ORDER — VANCOMYCIN HCL IN DEXTROSE 1-5 GM/200ML-% IV SOLN
1000.0000 mg | Freq: Once | INTRAVENOUS | Status: AC
  Administered 2023-05-25: 1000 mg via INTRAVENOUS
  Filled 2023-05-25: qty 200

## 2023-05-25 MED ORDER — KETOROLAC TROMETHAMINE 15 MG/ML IJ SOLN
15.0000 mg | Freq: Four times a day (QID) | INTRAMUSCULAR | Status: DC | PRN
  Administered 2023-05-25: 15 mg via INTRAVENOUS
  Filled 2023-05-25: qty 1

## 2023-05-25 MED ORDER — CALCIUM CHLORIDE 10 % IV SOLN
INTRAVENOUS | Status: AC
  Filled 2023-05-25: qty 10

## 2023-05-25 MED ORDER — NAPHAZOLINE-GLYCERIN 0.012-0.25 % OP SOLN
1.0000 [drp] | Freq: Four times a day (QID) | OPHTHALMIC | Status: DC | PRN
  Administered 2023-05-25: 2 [drp] via OPHTHALMIC
  Filled 2023-05-25: qty 15

## 2023-05-25 MED ORDER — SODIUM CHLORIDE 0.9% FLUSH
3.0000 mL | Freq: Two times a day (BID) | INTRAVENOUS | Status: DC
  Administered 2023-05-25 – 2023-05-26 (×3): 10 mL via INTRAVENOUS

## 2023-05-25 SURGICAL SUPPLY — 73 items
ADAPTER CARDIO PERF ANTE/RETRO (ADAPTER) ×2 IMPLANT
BAG DECANTER FOR FLEXI CONT (MISCELLANEOUS) ×2 IMPLANT
BLADE CLIPPER SURG (BLADE) ×2 IMPLANT
BLADE STERNUM SYSTEM 6 (BLADE) ×2 IMPLANT
BLADE SURG 15 STRL LF DISP TIS (BLADE) ×2 IMPLANT
CANISTER SUCT 3000ML PPV (MISCELLANEOUS) ×2 IMPLANT
CANNULA ARTERIAL NVNT 3/8 22FR (MISCELLANEOUS) IMPLANT
CANNULA GUNDRY RCSP 15FR (MISCELLANEOUS) ×2 IMPLANT
CANNULA MC2 2 STG 36/46 CONN (CANNULA) IMPLANT
CATH HEART VENT LEFT (CATHETERS) ×2 IMPLANT
CATH ROBINSON RED A/P 18FR (CATHETERS) ×6 IMPLANT
CATH THORACIC 36FR (CATHETERS) ×2 IMPLANT
CATH THORACIC 36FR RT ANG (CATHETERS) ×2 IMPLANT
CNTNR URN SCR LID CUP LEK RST (MISCELLANEOUS) ×2 IMPLANT
CONTAINER PROTECT SURGISLUSH (MISCELLANEOUS) ×4 IMPLANT
COVER SURGICAL LIGHT HANDLE (MISCELLANEOUS) ×2 IMPLANT
DEVICE SUT CK QUICK LOAD INDV (Prosthesis & Implant Heart) IMPLANT
DEVICE SUT CK QUICK LOAD MINI (Prosthesis & Implant Heart) IMPLANT
DRAPE SRG 135X102X78XABS (DRAPES) ×2 IMPLANT
DRAPE WARM FLUID 44X44 (DRAPES) ×2 IMPLANT
DRSG COVADERM 4X14 (GAUZE/BANDAGES/DRESSINGS) ×2 IMPLANT
ELECT CAUTERY BLADE 6.4 (BLADE) ×2 IMPLANT
ELECT REM PT RETURN 9FT ADLT (ELECTROSURGICAL) ×4
ELECTRODE REM PT RTRN 9FT ADLT (ELECTROSURGICAL) ×4 IMPLANT
FELT TEFLON 1X6 (MISCELLANEOUS) ×4 IMPLANT
GAUZE 4X4 16PLY ~~LOC~~+RFID DBL (SPONGE) ×2 IMPLANT
GAUZE SPONGE 4X4 12PLY STRL (GAUZE/BANDAGES/DRESSINGS) ×2 IMPLANT
GAUZE SPONGE 4X4 12PLY STRL LF (GAUZE/BANDAGES/DRESSINGS) IMPLANT
GLOVE BIO SURGEON STRL SZ 6 (GLOVE) IMPLANT
GLOVE BIO SURGEON STRL SZ 6.5 (GLOVE) IMPLANT
GLOVE BIO SURGEON STRL SZ7 (GLOVE) IMPLANT
GLOVE BIO SURGEON STRL SZ7.5 (GLOVE) IMPLANT
GLOVE SURG MICRO LTX SZ7 (GLOVE) ×4 IMPLANT
GOWN STRL REUS W/ TWL LRG LVL3 (GOWN DISPOSABLE) ×8 IMPLANT
GOWN STRL REUS W/ TWL XL LVL3 (GOWN DISPOSABLE) ×2 IMPLANT
HEMOSTAT POWDER SURGIFOAM 1G (HEMOSTASIS) ×6 IMPLANT
HEMOSTAT SURGICEL 2X14 (HEMOSTASIS) ×2 IMPLANT
KIT BASIN OR (CUSTOM PROCEDURE TRAY) ×2 IMPLANT
KIT CATH CPB BARTLE (MISCELLANEOUS) ×2 IMPLANT
KIT SUCTION CATH 14FR (SUCTIONS) ×2 IMPLANT
KIT SUT CK MINI COMBO 4X17 (Prosthesis & Implant Heart) IMPLANT
KIT TURNOVER KIT B (KITS) ×2 IMPLANT
LINE VENT (MISCELLANEOUS) IMPLANT
NS IRRIG 1000ML POUR BTL (IV SOLUTION) ×12 IMPLANT
PACK E OPEN HEART (SUTURE) ×2 IMPLANT
PACK OPEN HEART (CUSTOM PROCEDURE TRAY) ×2 IMPLANT
PAD ARMBOARD 7.5X6 YLW CONV (MISCELLANEOUS) ×4 IMPLANT
POSITIONER HEAD DONUT 9IN (MISCELLANEOUS) ×2 IMPLANT
SET MPS 3-ND DEL (MISCELLANEOUS) IMPLANT
SPONGE T-LAP 18X18 ~~LOC~~+RFID (SPONGE) ×8 IMPLANT
SPONGE T-LAP 4X18 ~~LOC~~+RFID (SPONGE) ×2 IMPLANT
SUT BONE WAX W31G (SUTURE) ×2 IMPLANT
SUT EB EXC GRN/WHT 2-0 V-5 (SUTURE) ×4 IMPLANT
SUT ETHIBON EXCEL 2-0 V-5 (SUTURE) IMPLANT
SUT ETHIBOND V-5 VALVE (SUTURE) IMPLANT
SUT PROLENE 3 0 SH DA (SUTURE) IMPLANT
SUT PROLENE 3 0 SH1 36 (SUTURE) ×2 IMPLANT
SUT PROLENE 4-0 RB1 .5 CRCL 36 (SUTURE) ×6 IMPLANT
SUT STEEL 6MS V (SUTURE) IMPLANT
SUT STEEL STERNAL CCS#1 18IN (SUTURE) IMPLANT
SUT STEEL SZ 6 DBL 3X14 BALL (SUTURE) IMPLANT
SUT VIC AB 1 CTX36XBRD ANBCTR (SUTURE) ×4 IMPLANT
SUT VIC AB 2-0 CTX 36 (SUTURE) IMPLANT
SYSTEM SAHARA CHEST DRAIN ATS (WOUND CARE) ×2 IMPLANT
TAPE CLOTH SURG 4X10 WHT LF (GAUZE/BANDAGES/DRESSINGS) IMPLANT
TAPE PAPER 2X10 WHT MICROPORE (GAUZE/BANDAGES/DRESSINGS) IMPLANT
TOWEL GREEN STERILE (TOWEL DISPOSABLE) ×2 IMPLANT
TOWEL GREEN STERILE FF (TOWEL DISPOSABLE) ×2 IMPLANT
TRAY FOLEY SLVR 16FR TEMP STAT (SET/KITS/TRAYS/PACK) ×2 IMPLANT
UNDERPAD 30X36 HEAVY ABSORB (UNDERPADS AND DIAPERS) ×2 IMPLANT
VALVE AORTIC SZ27 INSP/RESIL (Valve) IMPLANT
VENT LEFT HEART 12002 (CATHETERS) ×2
WATER STERILE IRR 1000ML POUR (IV SOLUTION) ×4 IMPLANT

## 2023-05-25 NOTE — Anesthesia Procedure Notes (Signed)
 Central Venous Catheter Insertion Performed by: Jake Mayers, MD, anesthesiologist Start/End2/01/2024 6:45 AM, 05/25/2023 7:00 AM Patient location: Pre-op. Preanesthetic checklist: patient identified, IV checked, site marked, risks and benefits discussed, surgical consent, monitors and equipment checked, pre-op evaluation, timeout performed and anesthesia consent Position: Trendelenburg Lidocaine  1% used for infiltration and patient sedated Hand hygiene performed , maximum sterile barriers used  and Seldinger technique used Catheter size: 8.5 Fr Total catheter length 10. Central line was placed.Sheath introducer Procedure performed using ultrasound guided technique. Ultrasound Notes:anatomy identified, needle tip was noted to be adjacent to the nerve/plexus identified, no ultrasound evidence of intravascular and/or intraneural injection and image(s) printed for medical record Attempts: 1 Following insertion, line sutured, dressing applied and Biopatch. Post procedure assessment: blood return through all ports, free fluid flow and no air  Patient tolerated the procedure well with no immediate complications.

## 2023-05-25 NOTE — Progress Notes (Signed)
 Tried to page CRNA Norbert Bean and it said unavailable.

## 2023-05-25 NOTE — Hospital Course (Addendum)
Primary Care Provider: Marcine Matar, MD [facility] Cardiologist: Thurmon Fair, MD   History of Present Illness: Joshua [name] is a 63 y.o. male with symptoms of critical aortic stenosis with NYHA Class IV symptoms currently admitted for acute decompensated heart failure with severely reduced LV function. He is generating a gradient of 50mm hg even with an EF of 30%.    Echo 05/18/23 showed EF 30-35%, mild LVH, global hypokinesis, moderate L/R pleural effusions, bicuspid AoV with mild AI and critical AS with a mean gradient of 50 mmg hg, peak gradient 80 mm hg, AVA 1.12cm2, DVI 0.23.    [facility] 05/18/23 showed mild non obstructive CAD and patent mLAD and pRCA stents with ~10% ISR as well as severely elevated left heart, right heart, and pulmonary artery pressures and fick cardiac output/index. He was started back on IV lasix (had just been transitioned back to PO) and Coreg held in the setting of severely reduced cardiac output and decompensated heart failure.    Pre TAVR CTs completed 05/19/23. Cardiac gated CTA of the heart revealed a bicuspid AoV with R-L fusion and heavily calcified raphe AoV calcium score 5234 with anatomy suitable for a 29 mm Edwards S3UR. CTA of the aorta and iliac vessels demonstrated what appears to be adequate pelvic vascular access to facilitate a transfemoral approach. CTA also noted pulmonary edema and moderate bilateral pleural effusions. He underwent successful US guided right thoracentesis with removal of 1.3 L of redish fluid. Fluid sent for cytology. Also pt noted to have frequent ventricular ectopy with a 13 beat run of NSVT and nocturnal CHB. All GDMT and BP meds held currently.    I have reviewed the natural history of aortic stenosis with the patient. We have discussed the limitations of medical therapy and the poor prognosis associated with symptomatic aortic stenosis. We have reviewed potential treatment options, including palliative medical therapy,  conventional surgical aortic valve replacement, and transcatheter aortic valve replacement. We discussed treatment options in the context of this patient's specific comorbid medical conditions.    The patient's predicted risk of mortality with conventional aortic valve replacement is 3.61% primarily based on age, severe LV dysfunction, acute CHF, CHB & NSVT on tele, obesity, CAD. With his severely calcified bicuspid aortic valve and young age he would be best served by SAVR. He presented with biventricular heart failure but seems improved with diuresis. I think his long term prognosis will be better with SAVR using a bioprosthetic valve. I discussed the operative procedure with the patient including alternatives, benefits and risks; including but not limited to bleeding, blood transfusion, infection, stroke, myocardial infarction, graft failure, heart block requiring a permanent pacemaker, organ dysfunction, and death.  Joshua [name] understands and agrees to proceed.  We will schedule surgery for Monday, 05/25/2023.  Hospital Course: Joshua [name] remained stable following presurgical workup.  He was taken to the operative room on 05/25/2023 where aortic valve replacement was accomplished utilizing a 27 mm [employer]tronic Inspiris bovine pericardial tissue valve.  Following the procedure, he separated from cardiopulmonary bypass without difficulty on epinephrine.  He was transferred to the surgical ICU in stable condition. He was extubated routinely on the afternoon of the day of surgery. He had a few brief episodes of atrial fibrillation and was started on oral amiodarone on the first post-op day. The epinephrine was weaned off.   Glucose was monitored and initially managed with an insulin drip that was later transitioned to subcutaneous sliding scale insulin on post-op day  1. The monitoring lines were removed and diuresis was begun.

## 2023-05-25 NOTE — Anesthesia Procedure Notes (Signed)
 Procedure Name: Intubation Date/Time: 05/25/2023 8:00 AM  Performed by: Alys Julian, CRNAPre-anesthesia Checklist: Patient identified, Emergency Drugs available, Suction available and Patient being monitored Patient Re-evaluated:Patient Re-evaluated prior to induction Oxygen Delivery Method: Circle system utilized Preoxygenation: Pre-oxygenation with 100% oxygen Induction Type: IV induction Ventilation: Mask ventilation without difficulty Laryngoscope Size: Glidescope and 4 Grade View: Grade I Tube type: Oral Tube size: 8.0 mm Number of attempts: 2 Airway Equipment and Method: Stylet and Video-laryngoscopy Placement Confirmation: ETT inserted through vocal cords under direct vision, positive ETCO2 and breath sounds checked- equal and bilateral Secured at: 21 cm Tube secured with: Tape Dental Injury: Teeth and Oropharynx as per pre-operative assessment  Difficulty Due To: Difficult Airway- due to anterior larynx Comments: 1st attempt Grade III view w/Miller 3

## 2023-05-25 NOTE — Progress Notes (Signed)
      [address]       [place] [address]             [phone]      S/p AVR  Extubated, c/o irritation of left eye  BP 118/84   Pulse 78   Temp 98.2 F (36.8 C)   Resp 18   Ht 6\' 1"  (1.854 m)   Wt 107.1 kg   SpO2 94%   BMI 31.15 kg/m  44/22 CI 2.3 on epi 2, norepi 2  Intake/Output Summary (Last 24 hours) at 05/25/2023 1650 Last data filed at 05/25/2023 1600 Gross per 24 hour  Intake 4183.01 ml  Output 3570 ml  Net 613.01 ml   CT 250 ml since OR  K 4.2, Hct 38'  Doing well postop  Landon Pinion C. Luna Salinas, MD [facility] [phone]

## 2023-05-25 NOTE — Anesthesia Procedure Notes (Signed)
 Central Venous Catheter Insertion Performed by: Jake Mayers, MD, anesthesiologist Start/End2/01/2024 6:45 AM, 05/25/2023 7:00 AM Patient location: Pre-op. Preanesthetic checklist: patient identified, IV checked, site marked, risks and benefits discussed, surgical consent, monitors and equipment checked, pre-op evaluation, timeout performed and anesthesia consent Hand hygiene performed  and maximum sterile barriers used  PA cath was placed.Swan type:thermodilution PA Cath depth:50 Procedure performed without using ultrasound guided technique. Attempts: 1 Patient tolerated the procedure well with no immediate complications.

## 2023-05-25 NOTE — Transfer of Care (Signed)
 Immediate Anesthesia Transfer of Care Note  Patient: Joshua [name]  Procedure(s) Performed: AORTIC VALVE REPLACEMENT (AVR) USING INSPIRIS AORTIC VALVE SIZE 27 MM (Chest) TRANSESOPHAGEAL ECHOCARDIOGRAM (TEE)  Patient Location: SICU  Anesthesia Type:General  Level of Consciousness: sedated and Patient remains intubated per anesthesia plan  Airway & Oxygen Therapy: Patient remains intubated per anesthesia plan and Patient placed on Ventilator (see vital sign flow sheet for setting)  Post-op Assessment: Report given to RN and Post -op Vital signs reviewed and stable  Post vital signs: Reviewed and stable  Last Vitals:  Vitals Value Taken Time  BP 98/63 05/25/23 1234  Temp 98.6   Pulse 79 05/25/23 1245  Resp 16 05/25/23 1245  SpO2 94 % 05/25/23 1245  Vitals shown include unfiled device data.  Last Pain:  Vitals:   05/25/23 0637  TempSrc:   PainSc: 0-No pain      Patients Stated Pain Goal: 2 (05/25/23 9528)  Complications: No notable events documented.

## 2023-05-25 NOTE — Plan of Care (Signed)
  Problem: Education: Goal: Knowledge of General Education information will improve Description: Including pain rating scale, medication(s)/side effects and non-pharmacologic comfort measures Outcome: Progressing   Problem: Health Behavior/Discharge Planning: Goal: Ability to manage health-related needs will improve Outcome: Progressing   Problem: Clinical Measurements: Goal: Ability to maintain clinical measurements within normal limits will improve Outcome: Progressing Goal: Will remain free from infection Outcome: Progressing Goal: Diagnostic test results will improve Outcome: Progressing Goal: Respiratory complications will improve Outcome: Progressing Goal: Cardiovascular complication will be avoided Outcome: Progressing   Problem: Activity: Goal: Risk for activity intolerance will decrease Outcome: Progressing   Problem: Skin Integrity: Goal: Risk for impaired skin integrity will decrease Outcome: Progressing   Problem: Education: Goal: Ability to demonstrate management of disease process will improve Outcome: Progressing Goal: Ability to verbalize understanding of medication therapies will improve Outcome: Progressing Goal: Individualized Educational Video(s) Outcome: Progressing   Problem: Activity: Goal: Capacity to carry out activities will improve Outcome: Progressing   Problem: Cardiac: Goal: Ability to achieve and maintain adequate cardiopulmonary perfusion will improve Outcome: Progressing   Problem: Education: Goal: Understanding of CV disease, CV risk reduction, and recovery process will improve Outcome: Progressing Goal: Individualized Educational Video(s) Outcome: Progressing   Problem: Cardiovascular: Goal: Ability to achieve and maintain adequate cardiovascular perfusion will improve Outcome: Progressing Goal: Vascular access site(s) Level 0-1 will be maintained Outcome: Progressing   Problem: Health Behavior/Discharge Planning: Goal:  Ability to safely manage health-related needs after discharge will improve Outcome: Progressing

## 2023-05-25 NOTE — Interval H&P Note (Signed)
 History and Physical Interval Note:  05/25/2023 6:36 AM  Joshua [name]  has presented today for surgery, with the diagnosis of SEVERE AS.  The various methods of treatment have been discussed with the patient and family. After consideration of risks, benefits and other options for treatment, the patient has consented to  Procedure(s): AORTIC VALVE REPLACEMENT (AVR) (N/A) TRANSESOPHAGEAL ECHOCARDIOGRAM (TEE) (N/A) as a surgical intervention.  The patient's history has been reviewed, patient examined, no change in status, stable for surgery.  I have reviewed the patient's chart and labs.  Questions were answered to the patient's satisfaction.     Sheniah Supak K Amarrah Meinhart

## 2023-05-25 NOTE — Discharge Instructions (Signed)
 Discharge Instructions:  1. You may shower, please wash incisions daily with soap and water and keep dry.  If you wish to cover wounds with dressing you may do so but please keep clean and change daily.  No tub baths or swimming until incisions have completely healed.  If your incisions become red or develop any drainage please call our office at [phone]  2. No Driving until cleared by Dr. Sharee Pimple office and you are no longer using narcotic pain medications  3. Monitor your weight daily.. Please use the same scale and weigh at same time... If you gain 5-10 lbs in 48 hours with associated lower extremity swelling, please contact our office at [phone]  4. Fever of 101.5 for at least 24 hours with no source, please contact our office at [phone]  5. Activity- up as tolerated, please walk at least 3 times per day.  Avoid strenuous activity, no lifting, pushing, or pulling with your arms over 8-10 lbs for a minimum of 6 weeks  6. If any questions or concerns arise, please do not hesitate to contact our office at [phone]

## 2023-05-25 NOTE — Op Note (Signed)
 CARDIOVASCULAR SURGERY OPERATIVE NOTE  05/25/2023 Joshua [name] [id number]  Surgeon:  Bartley Lightning, MD  First Assistant: Parris Bolognese,  PA-C: An experienced assistant was required given the complexity of this surgery and the standard of surgical care. The assistant was needed for exposure, dissection, suctioning, retraction of delicate tissues and sutures, instrument exchange and for overall help during this procedure.    Preoperative Diagnosis:  Severe bicuspid aortic valve stenosis   Postoperative Diagnosis:  Same   Procedure:  Median Sternotomy Extracorporeal circulation 3.   Aortic valve replacement using a 27 mm  Edwards INSPIRIS RESILIA pericardial valve.  Anesthesia:  General Endotracheal   Clinical History/Surgical Indication:  Joshua [name] is a 63 y.o. male with symptoms of critical aortic stenosis with NYHA Class IV symptoms currently admitted for acute decompensated heart failure with severely reduced LV function. He is generating a gradient of 50mm hg even with an EF of 30%.    Echo 05/18/23 showed EF 30-35%, mild LVH, global hypokinesis, moderate L/R pleural effusions, bicuspid AoV with mild AI and critical AS with a mean gradient of 50 mmg hg, peak gradient 80 mm hg, AVA 1.12cm2, DVI 0.23.    [facility] 05/18/23 showed mild non obstructive CAD and patent mLAD and pRCA stents with ~10% ISR as well as severely elevated left heart, right heart, and pulmonary artery pressures and fick cardiac output/index. He was started back on IV lasix  (had just been transitioned back to PO) and Coreg  held in the setting of severely reduced cardiac output and decompensated heart failure.    Pre TAVR CTs completed 05/19/23. Cardiac gated CTA of the heart revealed a bicuspid AoV with R-L fusion and heavily calcified raphe AoV calcium  score 5234 with anatomy suitable for a 29 mm Edwards S3UR. CTA of the aorta and iliac vessels demonstrated what appears to be adequate pelvic vascular  access to facilitate a transfemoral approach. CTA also noted pulmonary edema and moderate bilateral pleural effusions. He underwent successful US  guided right thoracentesis with removal of 1.3 L of redish fluid. Fluid sent for cytology. Also pt noted to have frequent ventricular ectopy with a 13 beat run of NSVT and nocturnal CHB. All GDMT and BP meds held currently.    I have reviewed the natural history of aortic stenosis with the patient. We have discussed the limitations of medical therapy and the poor prognosis associated with symptomatic aortic stenosis. We have reviewed potential treatment options, including palliative medical therapy, conventional surgical aortic valve replacement, and transcatheter aortic valve replacement. We discussed treatment options in the context of this patient's specific comorbid medical conditions.    The patient's predicted risk of mortality with conventional aortic valve replacement is 3.61% primarily based on age, severe LV dysfunction, acute CHF, CHB & NSVT on tele, obesity, CAD. With his severely calcified bicuspid aortic valve and young age he would be best served by SAVR. He presented with biventricular heart failure but seems improved with diuresis. I think his long term prognosis will be better with SAVR using a bioprosthetic valve. I discussed the operative procedure with the patient including alternatives, benefits and risks; including but not limited to bleeding, blood transfusion, infection, stroke, myocardial infarction, graft failure, heart block requiring a permanent pacemaker, organ dysfunction, and death.  Joshua [name] understands and agrees to proceed.   Preparation:  The patient was seen in the preoperative holding area and the correct patient, correct operation were confirmed with the patient after reviewing the medical record and catheterization. The  consent was signed by me. Preoperative antibiotics were given. A pulmonary arterial line and  radial arterial line were placed by the anesthesia team. The patient was taken back to the operating room and positioned supine on the operating room table. After being placed under general endotracheal anesthesia by the anesthesia team a foley catheter was placed. The neck, chest, abdomen, and both legs were prepped with betadine  soap and solution and draped in the usual sterile manner. A surgical time-out was taken and the correct patient and operative procedure were confirmed with the nursing and anesthesia staff.   Pre-bypass TEE:   Complete TEE assessment was performed by Dr. Rommie Coats. This showed severe bicuspid aortic valve stenosis  and moderate AI with a mean gradient of 39 mm Hg, AVA 0.5, moderate LV and RV systolic dysfunction with LVEF 30%. Trivial MR.    Post-bypass TEE:   Normal functioning prosthetic aortic valve with no perivalvular leak or regurgitation through the valve. Mean gradient 6 mm Hg. Left ventricular and right ventricular function improved. Trivial mitral regurgitation.    Cardiopulmonary Bypass:  A median sternotomy was performed. The pericardium was opened in the midline. Right ventricular function appeared normal. The ascending aorta was of normal size and had no palpable plaque. There were no contraindications to aortic cannulation or cross-clamping. The patient was fully systemically heparinized and the ACT was maintained > 400 sec. The proximal aortic arch was cannulated with a 22 F aortic cannula for arterial inflow. Venous cannulation was performed via the right atrial appendage using a two-staged venous cannula. An antegrade cardioplegia/vent cannula was inserted into the mid-ascending aorta. A left ventricular vent was placed via the right superior pulmonary vein. A retrograde cardioplegia cannnula was placed into the coronary sinus via the right atrium. Aortic occlusion was performed with a single cross-clamp. Systemic cooling to 32 degrees Centigrade  and topical cooling of the heart with iced saline were used. Cold antegrade and retrograde KBC cardioplegia was used to induce diastolic arrest.  A temperature probe was inserted into the interventricular septum and an insulating pad was placed in the pericardium. Carbon dioxide was insufflated into the pericardium at 5L/min throughout the procedure to minimize intracardiac air.   Aortic Valve Replacement: Assisted by Parris Bolognese, PA-C  A transverse aortotomy was performed 1 cm above the take-off of the right coronary artery. The native valve was bicuspid ( left/right fusion) with calcified leaflets and severe annular calcification. The ostia of the coronary arteries were in normal position, high up off the annulus, and were not obstructed. There was some spotty calcium  deposits in the root. The native valve leaflets were excised and the annulus was decalcified with rongeurs. Care was taken to remove all particulate debris. The left ventricle was directly inspected for debris and then irrigated with ice saline solution. The annulus was sized and a size 27 mm INSPIRIS pericardial valve was chosen. The model number was [id number] and the serial number was [id number]. While the valve was being prepared 2-0 Ethibond pledgeted horizontal mattress sutures were placed around the annulus with the pledgets in a sub-annular position. The sutures were placed through the sewing ring and the valve lowered into place. The sutures were tied using  CorKnots. The valve seated nicely and the coronary ostia were not obstructed. The prosthetic valve leaflets moved normally and there was no sub-valvular obstruction. The aortotomy was closed using 4-0 Prolene suture in 2 layers with felt strips to reinforce the closure.  Completion:  The patient was rewarmed  to 37 degrees Centigrade. De-airing maneuvers were performed and the head placed in trendelenburg position. The crossclamp was removed with a time of 91 minutes. There was  spontaneous return of sinus rhythm. The aortotomy was checked for hemostasis. Two temporary epicardial pacing wires were placed on the right atrium and two on the right ventricle. The left ventricular vent and retrograde cardioplegia cannulas were removed. The patient was weaned from CPB without difficulty on Epi 2, NE 3. CPB time was 115 minutes. Cardiac output was 5 LPM. Heparin  was fully reversed with protamine  and the aortic and venous cannulas removed. Hemostasis was achieved. Mediastinal drainage tubes were placed. The sternum was closed with double #6 stainless steel wires. The fascia was closed with continuous # 1 vicryl suture. The subcutaneous tissue was closed with 2-0 vicryl continuous suture. The skin was closed with 3-0 vicryl subcuticular suture. All sponge, needle, and instrument counts were reported correct at the end of the case. Dry sterile dressings were placed over the incisions and around the chest tubes which were connected to pleurevac suction. The patient was then transported to the surgical intensive care unit in stable condition.

## 2023-05-25 NOTE — Discharge Summary (Signed)
 Physician Discharge Summary  Patient ID: Joshua [name] MRN: [id number] DOB/AGE: 65-Jan-1962 65 y.o.  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 male with symptoms of critical aortic stenosis with NYHA Class IV symptoms currently admitted for acute decompensated heart failure with severely reduced LV function. He is generating a gradient of 50mm hg even with an EF of 30%.    Echo 05/18/23 showed EF 30-35%, mild LVH, global hypokinesis, moderate L/R pleural effusions, bicuspid AoV with mild AI and critical AS with a mean gradient of 50 mmg hg, peak gradient 80 mm hg, AVA 1.12cm2, DVI 0.23.    [facility] 05/18/23 showed mild non obstructive CAD and patent mLAD and pRCA stents with ~10% ISR as well as severely elevated left heart, right heart, and pulmonary artery pressures and fick cardiac output/index. He was started back on IV lasix  (had just been transitioned back to PO) and Coreg  held in the setting of severely reduced cardiac output and decompensated heart failure.    Pre TAVR CTs completed 05/19/23. Cardiac gated CTA of the heart revealed a bicuspid AoV  with R-L fusion and heavily calcified raphe AoV calcium  score 5234 with anatomy suitable for a 29 mm Edwards S3UR. CTA of the aorta and iliac vessels demonstrated what appears to be adequate pelvic vascular access to facilitate a transfemoral approach. CTA also noted pulmonary edema and moderate bilateral pleural effusions. He underwent successful US  guided right thoracentesis with removal of 1.3 L of redish fluid. Fluid sent for cytology. Also pt noted to have frequent ventricular ectopy with a 13 beat run of NSVT and nocturnal CHB. All GDMT and BP meds held currently.    I have reviewed the natural history of aortic stenosis with the patient. We have discussed the limitations of medical therapy and the poor prognosis associated with symptomatic aortic stenosis. We have reviewed potential treatment options, including palliative medical therapy, conventional surgical aortic valve replacement, and transcatheter aortic valve replacement. We discussed treatment options in the context of this patient's specific comorbid medical conditions.    The patient's predicted risk of mortality with conventional aortic valve replacement is 3.61% primarily based on age, severe LV dysfunction, acute CHF, CHB & NSVT on tele, obesity, CAD. With his severely calcified bicuspid aortic valve and young age he would be best served by SAVR. He presented with biventricular heart failure but seems improved with diuresis. I think his long term prognosis will be better with SAVR using a bioprosthetic valve. I discussed the operative procedure with the patient including alternatives, benefits and risks; including but not limited to bleeding, blood transfusion, infection, stroke, myocardial infarction, graft failure, heart block requiring a permanent pacemaker, organ dysfunction, and death.  Joshua [name] understands and agrees to proceed.  We will schedule surgery for Monday, 05/25/2023.  Hospital Course: Joshua [name] remained  stable following presurgical workup.  He was taken to the operative room on 05/25/2023 where aortic valve replacement was accomplished utilizing a 27 mm Medtronic Inspiris bovine pericardial tissue valve.  Following the procedure, he separated from cardiopulmonary bypass without difficulty on epinephrine .  He was transferred to the surgical ICU in stable condition. He was extubated routinely on the afternoon of the day of surgery. He had a few brief episodes of atrial fibrillation and was started on oral amiodarone  on the first post-op day. The epinephrine  was weaned off.   Glucose was monitored and initially managed with an insulin  drip that was later transitioned to subcutaneous sliding scale insulin  on post-op day 1. The monitoring lines were removed and diuresis was begun. He was mobilized and progressed rapidly to independence with ambulation and transfers. He was transitioned to 4E Progressive Care on post-op day 2. He continued to progress.  He had no further arrhythmias. The incision healed with no sign of complication. He was ready for discharge to home on post-op day 4.   His sister who is an Char[employer] fundraiser will be assisting him with recovery.   Consults: None  Significant Diagnostic Studies:   CLINICAL DATA:  Status post aortic valve repair   EXAM: PORTABLE CHEST 1 VIEW   COMPARISON:  05/26/2023   FINDINGS: Cardiac shadow remains enlarged. Aortic valve repair is again seen. Swan-Ganz catheter has been removed. The jugular sheath remains in place. Mediastinal drain has been removed as well. No pneumothorax is seen. Small effusions are noted bilaterally.   IMPRESSION: Small effusions bilaterally.     Electronically Signed   By: Joshua [name] M.D.   On: 05/27/2023 09:51    Treatments: CARDIOVASCULAR SURGERY OPERATIVE NOTE   05/25/2023 Joshua [name] [id number]   Surgeon:  Joshua Lightning, MD   First Assistant: Joshua Bolognese,  PA-C: An experienced assistant was required given the  complexity of this surgery and the standard of surgical care. The assistant was needed for exposure, dissection, suctioning, retraction of delicate tissues and sutures, instrument exchange and for overall help during this procedure.      Preoperative Diagnosis:  Severe bicuspid aortic valve stenosis     Postoperative Diagnosis:  Same     Procedure:   Median Sternotomy Extracorporeal circulation 3.   Aortic valve replacement using a 27 mm  Edwards INSPIRIS RESILIA pericardial valve.   Anesthesia:  General Endotracheal  Discharge Exam: Blood pressure 115/77, pulse (!) 55, temperature 97.9 F (36.6 C), temperature source Oral, resp. rate 18, height 6\' 1"  (1.854 m), weight 100.5 kg, SpO2 92%.  General appearance: alert and cooperative Neurologic: intact Heart: regular rate and rhythm Lungs: clear to auscultation bilaterally Extremities: no edema Wound: incisions healing well  Disposition:  Discharged to home in stable condition. Discharge Instructions     Amb Referral to Cardiac Rehabilitation   Complete by: As directed    Diagnosis: Valve Replacement   Valve: Aortic   After initial evaluation and assessments completed: Virtual Based Care may be provided alone or in conjunction with Phase 2 Cardiac Rehab based on patient barriers.: Yes   Intensive Cardiac Rehabilitation (ICR) [facility] location only OR Traditional Cardiac Rehabilitation (TCR) *If criteria for ICR are not met will enroll in TCR [facility] only): Yes      Allergies as of 05/29/2023       Reactions   Statins Other (See Comments)   Elevated LFTs        Medication List     STOP taking these  medications    amLODipine  5 MG tablet Commonly known as: NORVASC    lisinopril  10 MG tablet Commonly known as: ZESTRIL    potassium chloride  10 MEQ tablet Commonly known as: KLOR-CON        TAKE these medications    acetaminophen  325 MG tablet Commonly known as: TYLENOL  Take 2 tablets (650 mg total) by mouth every 6  (six) hours as needed.   amiodarone  200 MG tablet Commonly known as: PACERONE  Take 2 tablets (400 mg total) by mouth 2 (two) times daily. For 7 days then decrease the dose to 1 tablet (200mg ) TWICE daily for 14 days then decrease the dose to 1 tablet (200mg ) ONCE daily.   amoxicillin  500 MG tablet Commonly known as: AMOXIL  Take 4 tablets (2,000 mg total) by mouth once as needed for up to 1 dose (Take one hour prior to dental procedure).   aspirin  EC 325 MG tablet Take 1 tablet (325 mg total) by mouth daily. What changed:  medication strength how much to take   carvedilol  3.125 MG tablet Commonly known as: COREG  Take 1 tablet (3.125 mg total) by mouth 2 (two) times daily. What changed:  how much to take when to take this   fluticasone  50 MCG/ACT nasal spray Commonly known as: FLONASE  PLACE 1 SPRAY INTO BOTH NOSTRILS DAILY AS NEEDED FOR ALLERGIES OR RHINITIS.   loratadine 10 MG tablet Commonly known as: CLARITIN Take 10 mg by mouth daily as needed for allergies.   methocarbamol  500 MG tablet Commonly known as: ROBAXIN  Take 1 tablet (500 mg total) by mouth 2 (two) times daily as needed for muscle spasms.   oxyCODONE  5 MG immediate release tablet Commonly known as: Oxy IR/ROXICODONE  Take 1 tablet (5 mg total) by mouth every 6 (six) hours as needed for up to 7 days for severe pain (pain score 7-10).   Repatha  SureClick 140 MG/ML Soaj Generic drug: Evolocumab  INJECT 1 ML INTO THE SKIN EVERY 14 (FOURTEEN) DAYS.        Follow-up Information     [facility]. Go on 06/04/2023.   Specialty: Cardiothoracic Surgery Why: Your appointment for suture removal is at 10am. Contact information: [address] [place], [address] [place]  [address] [phone]        Joshua Lightning, MD. Go on 07/01/2023.   Specialty: Cardiothoracic Surgery Why: Your follow up appointment is at 11:30am. Please obtain a chest x-ray 1 hour before the  appointment at [facility] located at [address] Contact information: [address] [place] [address] [phone]         Carie Charity, NP. Go on 06/15/2023.   Specialty: Cardiology Why: Your appointment is at 10:05am. Contact information: [address] [place] [address] [phone]         [facility]. Go on 07/06/2023.   Specialty: Cardiology Why: Your appointment for an echocardiogram is 10:30am. Contact information: [address] [place]  [phone]                The patient has been discharged on:   1.Beta Blocker:  Yes [ x  ]                              No   [   ]  If No, reason:  2.Ace Inhibitor/ARB: Yes [  ]                                     No  [ x ]                                     If No, reason:  To be resumed in the outpatient setting.  3.Statin:   Yes [ x]                  No  [   ]                  If No, reason:  4.Ecasa:  Yes  [ x ]                  No   [   ]                  If No, reason:  5. ACS on Admission? No  P2Y12 Inhibitor:  Yes  [   ]                                No  [ x ]    Signed: Leata Providence, PA-C 05/29/2023, 8:37 AM

## 2023-05-25 NOTE — Brief Op Note (Signed)
 05/15/2023 - 05/25/2023  11:13 AM  PATIENT:  Joshua [name]  63 y.o. male  PRE-OPERATIVE DIAGNOSIS:  SEVERE AORTIC STENOSIS, BICUSPID AORTIC VALVE POST-OPERATIVE DIAGNOSIS:  SEVERE AORTIC STENOSIS, BICUSPID AORTIC VALVE  PROCEDURES:   AORTIC VALVE REPLACEMENT (AVR) USING INSPIRIS BIOPROSTHETIC AORTIC VALVE SIZE 27 MM   TRANSESOPHAGEAL ECHOCARDIOGRAM   SURGEON:  Bartley Lightning, MD   PHYSICIAN ASSISTANT: Daila Elbert  ASSISTANTS: Pesare, Piyanuch, RN, Scrub Person   Levorn Reason, RN, RN First Assistant   Hardy, Irven Manson, RN, Scrub Person   ANESTHESIA:   general  EBL:  BLOOD ADMINISTERED:none  DRAINS:  mediastinal drains    SPECIMEN:  Aortic valve leaflets  DISPOSITION OF SPECIMEN:  PATHOLOGY  COUNTS:  correct  DICTATION: .Dragon Dictation  PLAN OF CARE: Admit to inpatient   PATIENT DISPOSITION:  ICU - intubated and hemodynamically stable.   Delay start of Pharmacological VTE agent (>24hrs) due to surgical blood loss or risk of bleeding: yes

## 2023-05-25 NOTE — Anesthesia Postprocedure Evaluation (Signed)
 Anesthesia Post Note  Patient: Joshua [name]  Procedure(s) Performed: AORTIC VALVE REPLACEMENT (AVR) USING INSPIRIS AORTIC VALVE SIZE 27 MM (Chest) TRANSESOPHAGEAL ECHOCARDIOGRAM (TEE)     Patient location during evaluation: SICU Anesthesia Type: General Level of consciousness: sedated Pain management: pain level controlled Vital Signs Assessment: post-procedure vital signs reviewed and stable Respiratory status: patient remains intubated per anesthesia plan Cardiovascular status: stable Postop Assessment: no apparent nausea or vomiting Anesthetic complications: no   No notable events documented.  Last Vitals:  Vitals:   05/25/23 1455 05/25/23 1500  BP:    Pulse: 80 80  Resp: 15 19  Temp: 36.8 C 36.8 C  SpO2: 92% 92%    Last Pain:  Vitals:   05/25/23 1436  TempSrc:   PainSc: 10-Worst pain ever                 Tony Granquist,W. EDMOND

## 2023-05-25 NOTE — Anesthesia Procedure Notes (Addendum)
 Arterial Line Insertion Start/End2/01/2024 7:00 AM, 05/25/2023 7:05 AM Performed by: Alys Julian, CRNA, CRNA  Patient location: Pre-op. Preanesthetic checklist: patient identified, IV checked, site marked, risks and benefits discussed, surgical consent, monitors and equipment checked, pre-op evaluation, timeout performed and anesthesia consent Lidocaine  1% used for infiltration and patient sedated Left, radial was placed Catheter size: 20 G Hand hygiene performed  and maximum sterile barriers used  Allen's test indicative of satisfactory collateral circulation Attempts: 1 Procedure performed using ultrasound guided technique. Ultrasound Notes:anatomy identified, needle tip was noted to be adjacent to the nerve/plexus identified and no ultrasound evidence of intravascular and/or intraneural injection Following insertion, dressing applied and Biopatch. Post procedure assessment: normal and unchanged  Patient tolerated the procedure well with no immediate complications.

## 2023-05-25 NOTE — Procedures (Signed)
 Extubation Procedure Note  Patient Details:   Name: Joshua [name] DOB: June 27, 1960 MRN: [id number]   Airway Documentation:    Vent end date: 05/25/23 Vent end time: 1435   Evaluation  O2 sats: stable throughout Complications: No apparent complications Patient did tolerate procedure well. Bilateral Breath Sounds: Clear   Yes, pt could speak post extubation.  Pt extubated successfully to 4 l/m Tower.  [name] 05/25/2023, 2:36 PM

## 2023-05-26 ENCOUNTER — Encounter (HOSPITAL_COMMUNITY): Payer: Self-pay | Admitting: Surgery

## 2023-05-26 ENCOUNTER — Inpatient Hospital Stay (HOSPITAL_COMMUNITY): Payer: Medicaid Other

## 2023-05-26 ENCOUNTER — Other Ambulatory Visit: Payer: Self-pay | Admitting: Home Health

## 2023-05-26 DIAGNOSIS — I110 Hypertensive heart disease with heart failure: Secondary | ICD-10-CM | POA: Diagnosis not present

## 2023-05-26 DIAGNOSIS — I4891 Unspecified atrial fibrillation: Secondary | ICD-10-CM | POA: Diagnosis not present

## 2023-05-26 DIAGNOSIS — E669 Obesity, unspecified: Secondary | ICD-10-CM | POA: Diagnosis not present

## 2023-05-26 DIAGNOSIS — R7303 Prediabetes: Secondary | ICD-10-CM | POA: Diagnosis not present

## 2023-05-26 DIAGNOSIS — Q2381 Bicuspid aortic valve: Secondary | ICD-10-CM | POA: Diagnosis not present

## 2023-05-26 DIAGNOSIS — I5043 Acute on chronic combined systolic (congestive) and diastolic (congestive) heart failure: Secondary | ICD-10-CM | POA: Diagnosis not present

## 2023-05-26 DIAGNOSIS — Z1152 Encounter for screening for COVID-19: Secondary | ICD-10-CM | POA: Diagnosis not present

## 2023-05-26 DIAGNOSIS — I2510 Atherosclerotic heart disease of native coronary artery without angina pectoris: Secondary | ICD-10-CM | POA: Diagnosis not present

## 2023-05-26 DIAGNOSIS — I442 Atrioventricular block, complete: Secondary | ICD-10-CM | POA: Diagnosis not present

## 2023-05-26 DIAGNOSIS — I472 Ventricular tachycardia, unspecified: Secondary | ICD-10-CM | POA: Diagnosis not present

## 2023-05-26 DIAGNOSIS — Z6831 Body mass index (BMI) 31.0-31.9, adult: Secondary | ICD-10-CM | POA: Diagnosis not present

## 2023-05-26 DIAGNOSIS — T82855A Stenosis of coronary artery stent, initial encounter: Secondary | ICD-10-CM | POA: Diagnosis not present

## 2023-05-26 DIAGNOSIS — Z952 Presence of prosthetic heart valve: Secondary | ICD-10-CM

## 2023-05-26 LAB — BASIC METABOLIC PANEL
Anion gap: 7 (ref 5–15)
Anion gap: 10 (ref 5–15)
Anion gap: 13 (ref 5–15)
BUN: 9 mg/dL (ref 8–23)
BUN: 8 mg/dL (ref 8–23)
BUN: 10 mg/dL (ref 8–23)
CO2: 23 mmol/L (ref 22–32)
CO2: 24 mmol/L (ref 22–32)
CO2: 24 mmol/L (ref 22–32)
Calcium: 7.6 mg/dL — ABNORMAL LOW (ref 8.9–10.3)
Calcium: 8.1 mg/dL — ABNORMAL LOW (ref 8.9–10.3)
Calcium: 8.7 mg/dL — ABNORMAL LOW (ref 8.9–10.3)
Chloride: 102 mmol/L (ref 98–111)
Chloride: 102 mmol/L (ref 98–111)
Chloride: 94 mmol/L — ABNORMAL LOW (ref 98–111)
Creatinine, Ser: 0.84 mg/dL (ref 0.61–1.24)
Creatinine, Ser: 0.79 mg/dL (ref 0.61–1.24)
Creatinine, Ser: 1.15 mg/dL (ref 0.61–1.24)
GFR, Estimated: >60 mL/min (ref >60)
GFR, Estimated: >60 mL/min (ref >60)
GFR, Estimated: >60 mL/min (ref >60)
Glucose, Bld: 196 mg/dL — ABNORMAL HIGH (ref 70–99)
Glucose, Bld: 128 mg/dL — ABNORMAL HIGH (ref 70–99)
Glucose, Bld: 198 mg/dL — ABNORMAL HIGH (ref 70–99)
Potassium: 4.3 mmol/L (ref 3.5–5.1)
Potassium: 4.3 mmol/L (ref 3.5–5.1)
Potassium: 4.3 mmol/L (ref 3.5–5.1)
Sodium: 132 mmol/L — ABNORMAL LOW (ref 135–145)
Sodium: 136 mmol/L (ref 135–145)
Sodium: 131 mmol/L — ABNORMAL LOW (ref 135–145)

## 2023-05-26 LAB — CBC
HCT: 35.3 % — ABNORMAL LOW (ref 39.0–52.0)
HCT: 37.5 % — ABNORMAL LOW (ref 39.0–52.0)
Hemoglobin: 11.5 g/dL — ABNORMAL LOW (ref 13.0–17.0)
Hemoglobin: 12.4 g/dL — ABNORMAL LOW (ref 13.0–17.0)
MCH: 30.4 pg (ref 26.0–34.0)
MCH: 30.5 pg (ref 26.0–34.0)
MCHC: 32.6 g/dL (ref 30.0–36.0)
MCHC: 33.1 g/dL (ref 30.0–36.0)
MCV: 93.4 fL (ref 80.0–100.0)
MCV: 92.4 fL (ref 80.0–100.0)
Platelets: 130 10*3/uL — ABNORMAL LOW (ref 150–400)
Platelets: 107 10*3/uL — ABNORMAL LOW (ref 150–400)
RBC: 3.78 MIL/uL — ABNORMAL LOW (ref 4.22–5.81)
RBC: 4.06 MIL/uL — ABNORMAL LOW (ref 4.22–5.81)
RDW: 13.5 % (ref 11.5–15.5)
RDW: 13.2 % (ref 11.5–15.5)
WBC: 11.9 10*3/uL — ABNORMAL HIGH (ref 4.0–10.5)
WBC: 11.9 10*3/uL — ABNORMAL HIGH (ref 4.0–10.5)
nRBC: 0 % (ref 0.0–0.2)
nRBC: 0 % (ref 0.0–0.2)

## 2023-05-26 LAB — GLUCOSE, CAPILLARY
Glucose-Capillary: 107 mg/dL — ABNORMAL HIGH (ref 70–99)
Glucose-Capillary: 112 mg/dL — ABNORMAL HIGH (ref 70–99)
Glucose-Capillary: 133 mg/dL — ABNORMAL HIGH (ref 70–99)
Glucose-Capillary: 143 mg/dL — ABNORMAL HIGH (ref 70–99)
Glucose-Capillary: 148 mg/dL — ABNORMAL HIGH (ref 70–99)
Glucose-Capillary: 155 mg/dL — ABNORMAL HIGH (ref 70–99)
Glucose-Capillary: 156 mg/dL — ABNORMAL HIGH (ref 70–99)
Glucose-Capillary: 169 mg/dL — ABNORMAL HIGH (ref 70–99)
Glucose-Capillary: 178 mg/dL — ABNORMAL HIGH (ref 70–99)
Glucose-Capillary: 181 mg/dL — ABNORMAL HIGH (ref 70–99)
Glucose-Capillary: 202 mg/dL — ABNORMAL HIGH (ref 70–99)

## 2023-05-26 LAB — MAGNESIUM
Magnesium: 2.2 mg/dL (ref 1.7–2.4)
Magnesium: 1.9 mg/dL (ref 1.7–2.4)

## 2023-05-26 MED ORDER — AMIODARONE HCL 200 MG PO TABS
400.0000 mg | ORAL_TABLET | Freq: Two times a day (BID) | ORAL | Status: DC
  Administered 2023-05-26 – 2023-05-29 (×7): 400 mg via ORAL
  Filled 2023-05-26 (×7): qty 2

## 2023-05-26 MED ORDER — INSULIN ASPART 100 UNIT/ML IJ SOLN: 0.0000 [IU] | INTRAMUSCULAR | Status: DC

## 2023-05-26 MED ORDER — FUROSEMIDE 10 MG/ML IJ SOLN
40.0000 mg | Freq: Two times a day (BID) | INTRAMUSCULAR | Status: AC
  Administered 2023-05-26 (×2): 40 mg via INTRAVENOUS
  Filled 2023-05-26 (×2): qty 4

## 2023-05-26 MED ORDER — INSULIN ASPART 100 UNIT/ML IJ SOLN
0.0000 [IU] | INTRAMUSCULAR | Status: DC
  Administered 2023-05-26 (×2): 2 [IU] via SUBCUTANEOUS
  Administered 2023-05-26: 8 [IU] via SUBCUTANEOUS
  Administered 2023-05-27: 2 [IU] via SUBCUTANEOUS

## 2023-05-26 MED ORDER — ENOXAPARIN SODIUM 40 MG/0.4ML IJ SOSY
40.0000 mg | PREFILLED_SYRINGE | Freq: Every day | INTRAMUSCULAR | Status: DC
  Administered 2023-05-26 – 2023-05-28 (×3): 40 mg via SUBCUTANEOUS
  Filled 2023-05-26 (×3): qty 0.4

## 2023-05-26 MED ORDER — INSULIN GLARGINE-YFGN 100 UNIT/ML ~~LOC~~ SOLN
20.0000 [IU] | Freq: Every day | SUBCUTANEOUS | Status: DC
  Administered 2023-05-26 – 2023-05-28 (×3): 20 [IU] via SUBCUTANEOUS
  Filled 2023-05-26 (×4): qty 0.2

## 2023-05-26 MED FILL — Heparin Sodium (Porcine) Inj 1000 Unit/ML: INTRAMUSCULAR | Qty: 20 | Status: AC

## 2023-05-26 MED FILL — Calcium Chloride Inj 10%: INTRAVENOUS | Qty: 10 | Status: AC

## 2023-05-26 MED FILL — Sodium Chloride IV Soln 0.9%: INTRAVENOUS | Qty: 2000 | Status: AC

## 2023-05-26 MED FILL — Sodium Bicarbonate IV Soln 8.4%: INTRAVENOUS | Qty: 50 | Status: AC

## 2023-05-26 MED FILL — Mannitol IV Soln 20%: INTRAVENOUS | Qty: 500 | Status: AC

## 2023-05-26 MED FILL — Electrolyte-R (PH 7.4) Solution: INTRAVENOUS | Qty: 5000 | Status: AC

## 2023-05-26 MED FILL — Thrombin (Recombinant) For Soln 20000 Unit: CUTANEOUS | Qty: 1 | Status: AC

## 2023-05-26 NOTE — Plan of Care (Signed)
  Problem: Education: Goal: Knowledge of General Education information will improve Description: Including pain rating scale, medication(s)/side effects and non-pharmacologic comfort measures Outcome: Progressing   Problem: Health Behavior/Discharge Planning: Goal: Ability to manage health-related needs will improve Outcome: Progressing   Problem: Clinical Measurements: Goal: Ability to maintain clinical measurements within normal limits will improve Outcome: Progressing Goal: Will remain free from infection Outcome: Progressing Goal: Diagnostic test results will improve Outcome: Progressing Goal: Respiratory complications will improve Outcome: Progressing Goal: Cardiovascular complication will be avoided Outcome: Progressing   Problem: Activity: Goal: Risk for activity intolerance will decrease Outcome: Progressing   Problem: Skin Integrity: Goal: Risk for impaired skin integrity will decrease Outcome: Progressing   Problem: Activity: Goal: Capacity to carry out activities will improve Outcome: Progressing   Problem: Cardiac: Goal: Ability to achieve and maintain adequate cardiopulmonary perfusion will improve Outcome: Progressing   Problem: Education: Goal: Understanding of CV disease, CV risk reduction, and recovery process will improve Outcome: Progressing   Problem: Cardiovascular: Goal: Ability to achieve and maintain adequate cardiovascular perfusion will improve Outcome: Progressing Goal: Vascular access site(s) Level 0-1 will be maintained Outcome: Progressing   Problem: Health Behavior/Discharge Planning: Goal: Ability to safely manage health-related needs after discharge will improve Outcome: Progressing   Problem: Education: Goal: Will demonstrate proper wound care and an understanding of methods to prevent future damage Outcome: Progressing Goal: Knowledge of disease or condition will improve Outcome: Progressing Goal: Knowledge of the prescribed  therapeutic regimen will improve Outcome: Progressing   Problem: Activity: Goal: Risk for activity intolerance will decrease Outcome: Progressing   Problem: Cardiac: Goal: Will achieve and/or maintain hemodynamic stability Outcome: Progressing   Problem: Clinical Measurements: Goal: Postoperative complications will be avoided or minimized Outcome: Progressing   Problem: Respiratory: Goal: Respiratory status will improve Outcome: Progressing   Problem: Skin Integrity: Goal: Wound healing without signs and symptoms of infection Outcome: Progressing Goal: Risk for impaired skin integrity will decrease Outcome: Progressing   Problem: Urinary Elimination: Goal: Ability to achieve and maintain adequate renal perfusion and functioning will improve Outcome: Progressing

## 2023-05-26 NOTE — Plan of Care (Signed)
  Problem: Education: Goal: Knowledge of General Education information will improve Description: Including pain rating scale, medication(s)/side effects and non-pharmacologic comfort measures Outcome: Progressing   Problem: Health Behavior/Discharge Planning: Goal: Ability to manage health-related needs will improve Outcome: Progressing   Problem: Clinical Measurements: Goal: Ability to maintain clinical measurements within normal limits will improve Outcome: Progressing Goal: Will remain free from infection Outcome: Progressing Goal: Diagnostic test results will improve Outcome: Progressing Goal: Respiratory complications will improve Outcome: Progressing Goal: Cardiovascular complication will be avoided Outcome: Progressing   Problem: Activity: Goal: Risk for activity intolerance will decrease Outcome: Progressing   Problem: Skin Integrity: Goal: Risk for impaired skin integrity will decrease Outcome: Progressing   Problem: Education: Goal: Ability to demonstrate management of disease process will improve Outcome: Progressing Goal: Ability to verbalize understanding of medication therapies will improve Outcome: Progressing Goal: Individualized Educational Video(s) Outcome: Progressing   Problem: Activity: Goal: Capacity to carry out activities will improve Outcome: Progressing   Problem: Cardiac: Goal: Ability to achieve and maintain adequate cardiopulmonary perfusion will improve Outcome: Progressing   Problem: Education: Goal: Understanding of CV disease, CV risk reduction, and recovery process will improve Outcome: Progressing Goal: Individualized Educational Video(s) Outcome: Progressing   Problem: Cardiovascular: Goal: Ability to achieve and maintain adequate cardiovascular perfusion will improve Outcome: Progressing Goal: Vascular access site(s) Level 0-1 will be maintained Outcome: Progressing   Problem: Health Behavior/Discharge Planning: Goal:  Ability to safely manage health-related needs after discharge will improve Outcome: Progressing   Problem: Education: Goal: Will demonstrate proper wound care and an understanding of methods to prevent future damage Outcome: Progressing Goal: Knowledge of disease or condition will improve Outcome: Progressing Goal: Knowledge of the prescribed therapeutic regimen will improve Outcome: Progressing Goal: Individualized Educational Video(s) Outcome: Progressing   Problem: Activity: Goal: Risk for activity intolerance will decrease Outcome: Progressing   Problem: Cardiac: Goal: Will achieve and/or maintain hemodynamic stability Outcome: Progressing   Problem: Clinical Measurements: Goal: Postoperative complications will be avoided or minimized Outcome: Progressing   Problem: Respiratory: Goal: Respiratory status will improve Outcome: Progressing   Problem: Skin Integrity: Goal: Wound healing without signs and symptoms of infection Outcome: Progressing Goal: Risk for impaired skin integrity will decrease Outcome: Progressing   Problem: Urinary Elimination: Goal: Ability to achieve and maintain adequate renal perfusion and functioning will improve Outcome: Progressing

## 2023-05-26 NOTE — Progress Notes (Signed)
1 Day Post-Op Procedure(s) (LRB): AORTIC VALVE REPLACEMENT (AVR) USING INSPIRIS AORTIC VALVE SIZE 27 MM (N/A) TRANSESOPHAGEAL ECHOCARDIOGRAM (TEE) (N/A) Subjective: No complaints.  Objective: Vital signs in last 24 hours: Temp:  [97.5 F (36.4 C)-100.8 F (38.2 C)] 100.2 F (37.9 C) (02/11 0615) Pulse Rate:  [57-95] 78 (02/11 0615) Cardiac Rhythm: Normal sinus rhythm (02/10 2000) Resp:  [11-30] 18 (02/11 0615) BP: (80-127)/(56-86) 127/77 (02/11 0600) SpO2:  [68 %-99 %] 89 % (02/11 0615) Arterial Line BP: (84-164)/(41-93) 119/56 (02/11 0615) FiO2 (%):  [36 %-80 %] 36 % (02/10 1436) Weight:  [109.2 kg] 109.2 kg (02/11 0500)  Hemodynamic parameters for last 24 hours: PAP: (-5-82)/(-18-40) 52/23 CVP:  [3 mmHg-23 mmHg] 16 mmHg CO:  [5.3 L/min-7.5 L/min] 7.1 L/min CI:  [2.28 L/min/m2-3.3 L/min/m2] 3.1 L/min/m2  Intake/Output from previous day: 02/10 0701 - 02/11 0700 In: 5020.2 [P.O.:480; I.V.:2543.2; Blood:500; IV Piggyback:1497] Out: 4943 [Urine:3423; Blood:850; Chest Tube:670] Intake/Output this shift: Total I/O In: 821.9 [P.O.:480; I.V.:241.9; IV Piggyback:100.1] Out: 1920 [Urine:1610; Chest Tube:310]  General appearance: alert and cooperative Neurologic: intact Heart: regular rate and rhythm Lungs: clear to auscultation bilaterally Extremities: edema mild Wound: dressing dry  Lab Results: Recent Labs    05/25/23 1820 05/26/23 0405  WBC 13.9* 11.9*  HGB 12.4* 11.5*  HCT 38.4* 35.3*  PLT 125* 130*   BMET:  Recent Labs    05/26/23 0035 05/26/23 0405  NA 132* 136  K 4.3 4.3  CL 102 102  CO2 23 24  GLUCOSE 196* 128*  BUN 9 8  CREATININE 0.84 0.79  CALCIUM 7.6* 8.1*    PT/INR:  Recent Labs    05/25/23 1235  LABPROT 17.5*  INR 1.4*   ABG    Component Value Date/Time   PHART 7.316 (L) 05/25/2023 1534   HCO3 22.8 05/25/2023 1534   TCO2 24 05/25/2023 1534   ACIDBASEDEF 4.0 (H) 05/25/2023 1534   O2SAT 91 05/25/2023 1534   CBG (last 3)  Recent  Labs    05/26/23 0225 05/26/23 0409 05/26/23 0522  GLUCAP 148* 112* 143*   CXR: mild left base atelectasis  ECG: says atrial fib but looks sinus to me. No acute changes. Assessment/Plan: S/P Procedure(s) (LRB): AORTIC VALVE REPLACEMENT (AVR) USING INSPIRIS AORTIC VALVE SIZE 27 MM (N/A) TRANSESOPHAGEAL ECHOCARDIOGRAM (TEE) (N/A)  POD 1 Hemodynamically stable. Rhythm on monitor looks sinus at times and atrial fib with controlled rate at times. Will start on po amio. Hold on beta blocker for now until stable off epi. Wean epi off. Preop EF 40-45% on echo in OR. Eventually will get back on Coreg and lisinopril.  Start diuresis. Not sure what accurate preop wt is. Recorded at 223 for days and then yesterday am recorded at 236. Now 240 postop.   DC swan, arterial line and chest tubes.  Glucose under control on insulin drip. Preop Hgb A1c 6.0 on no meds. Will transition to [facility] and SSI.  IS, OOB, mobilize.   LOS: 11 days    Alleen Borne 05/26/2023

## 2023-05-27 ENCOUNTER — Inpatient Hospital Stay (HOSPITAL_COMMUNITY): Payer: Medicaid Other

## 2023-05-27 LAB — CBC
HCT: 35.2 % — ABNORMAL LOW (ref 39.0–52.0)
Hemoglobin: 11.4 g/dL — ABNORMAL LOW (ref 13.0–17.0)
MCH: 30.1 pg (ref 26.0–34.0)
MCHC: 32.4 g/dL (ref 30.0–36.0)
MCV: 92.9 fL (ref 80.0–100.0)
Platelets: 110 10*3/uL — ABNORMAL LOW (ref 150–400)
RBC: 3.79 MIL/uL — ABNORMAL LOW (ref 4.22–5.81)
RDW: 13.2 % (ref 11.5–15.5)
WBC: 12.9 10*3/uL — ABNORMAL HIGH (ref 4.0–10.5)
nRBC: 0 % (ref 0.0–0.2)

## 2023-05-27 LAB — GLUCOSE, CAPILLARY
Glucose-Capillary: 153 mg/dL — ABNORMAL HIGH (ref 70–99)
Glucose-Capillary: 151 mg/dL — ABNORMAL HIGH (ref 70–99)
Glucose-Capillary: 111 mg/dL — ABNORMAL HIGH (ref 70–99)
Glucose-Capillary: 102 mg/dL — ABNORMAL HIGH (ref 70–99)
Glucose-Capillary: 150 mg/dL — ABNORMAL HIGH (ref 70–99)

## 2023-05-27 LAB — BASIC METABOLIC PANEL
Anion gap: 12 (ref 5–15)
BUN: 11 mg/dL (ref 8–23)
CO2: 23 mmol/L (ref 22–32)
Calcium: 8.4 mg/dL — ABNORMAL LOW (ref 8.9–10.3)
Chloride: 96 mmol/L — ABNORMAL LOW (ref 98–111)
Creatinine, Ser: 1.12 mg/dL (ref 0.61–1.24)
GFR, Estimated: >60 mL/min (ref >60)
Glucose, Bld: 179 mg/dL — ABNORMAL HIGH (ref 70–99)
Potassium: 4 mmol/L (ref 3.5–5.1)
Sodium: 131 mmol/L — ABNORMAL LOW (ref 135–145)

## 2023-05-27 LAB — SURGICAL PATHOLOGY

## 2023-05-27 MED ORDER — CARVEDILOL 3.125 MG PO TABS
3.1250 mg | ORAL_TABLET | Freq: Two times a day (BID) | ORAL | Status: DC
  Administered 2023-05-27 – 2023-05-29 (×5): 3.125 mg via ORAL
  Filled 2023-05-27 (×5): qty 1

## 2023-05-27 MED ORDER — POTASSIUM CHLORIDE CRYS ER 20 MEQ PO TBCR
40.0000 meq | EXTENDED_RELEASE_TABLET | Freq: Two times a day (BID) | ORAL | Status: AC
Start: 2023-05-27 — End: 2023-05-27
  Administered 2023-05-27 (×2): 40 meq via ORAL
  Filled 2023-05-27 (×2): qty 2

## 2023-05-27 MED ORDER — INSULIN ASPART 100 UNIT/ML IJ SOLN
0.0000 [IU] | Freq: Three times a day (TID) | INTRAMUSCULAR | Status: DC
  Administered 2023-05-27 – 2023-05-29 (×5): 2 [IU] via SUBCUTANEOUS

## 2023-05-27 MED ORDER — METHOCARBAMOL 500 MG PO TABS
500.0000 mg | ORAL_TABLET | Freq: Three times a day (TID) | ORAL | Status: DC | PRN
  Administered 2023-05-27 – 2023-05-29 (×2): 500 mg via ORAL
  Filled 2023-05-27 (×2): qty 1

## 2023-05-27 MED ORDER — FUROSEMIDE 10 MG/ML IJ SOLN
40.0000 mg | Freq: Two times a day (BID) | INTRAMUSCULAR | Status: AC
  Administered 2023-05-27 (×2): 40 mg via INTRAVENOUS
  Filled 2023-05-27 (×2): qty 4

## 2023-05-27 MED FILL — Lidocaine HCl Local Preservative Free (PF) Inj 2%: INTRAMUSCULAR | Qty: 14 | Status: AC

## 2023-05-27 MED FILL — Heparin Sodium (Porcine) Inj 1000 Unit/ML: Qty: 1000 | Status: AC

## 2023-05-27 MED FILL — Potassium Chloride Inj 2 mEq/ML: INTRAVENOUS | Qty: 40 | Status: AC

## 2023-05-27 NOTE — Plan of Care (Signed)
  Problem: Clinical Measurements: Goal: Diagnostic test results will improve Outcome: Progressing   Problem: Clinical Measurements: Goal: Will remain free from infection Outcome: Progressing   Problem: Clinical Measurements: Goal: Respiratory complications will improve Outcome: Progressing   Problem: Education: Goal: Ability to demonstrate management of disease process will improve Outcome: Progressing Goal: Ability to verbalize understanding of medication therapies will improve Outcome: Progressing Goal: Individualized Educational Video(s) Outcome: Progressing

## 2023-05-27 NOTE — Plan of Care (Signed)
  Problem: Education: Goal: Knowledge of General Education information will improve Description: Including pain rating scale, medication(s)/side effects and non-pharmacologic comfort measures Outcome: Progressing   Problem: Health Behavior/Discharge Planning: Goal: Ability to manage health-related needs will improve Outcome: Progressing   Problem: Clinical Measurements: Goal: Ability to maintain clinical measurements within normal limits will improve Outcome: Progressing Goal: Will remain free from infection Outcome: Progressing Goal: Diagnostic test results will improve Outcome: Progressing Goal: Respiratory complications will improve Outcome: Progressing Goal: Cardiovascular complication will be avoided Outcome: Progressing   Problem: Activity: Goal: Risk for activity intolerance will decrease Outcome: Progressing   Problem: Skin Integrity: Goal: Risk for impaired skin integrity will decrease Outcome: Progressing   Problem: Education: Goal: Ability to demonstrate management of disease process will improve Outcome: Progressing Goal: Ability to verbalize understanding of medication therapies will improve Outcome: Progressing Goal: Individualized Educational Video(s) Outcome: Progressing   Problem: Activity: Goal: Capacity to carry out activities will improve Outcome: Progressing   Problem: Cardiac: Goal: Ability to achieve and maintain adequate cardiopulmonary perfusion will improve Outcome: Progressing   Problem: Education: Goal: Understanding of CV disease, CV risk reduction, and recovery process will improve Outcome: Progressing Goal: Individualized Educational Video(s) Outcome: Progressing   Problem: Cardiovascular: Goal: Ability to achieve and maintain adequate cardiovascular perfusion will improve Outcome: Progressing Goal: Vascular access site(s) Level 0-1 will be maintained Outcome: Progressing   Problem: Health Behavior/Discharge Planning: Goal:  Ability to safely manage health-related needs after discharge will improve Outcome: Progressing   Problem: Education: Goal: Will demonstrate proper wound care and an understanding of methods to prevent future damage Outcome: Progressing Goal: Knowledge of disease or condition will improve Outcome: Progressing Goal: Knowledge of the prescribed therapeutic regimen will improve Outcome: Progressing Goal: Individualized Educational Video(s) Outcome: Progressing   Problem: Activity: Goal: Risk for activity intolerance will decrease Outcome: Progressing   Problem: Cardiac: Goal: Will achieve and/or maintain hemodynamic stability Outcome: Progressing   Problem: Clinical Measurements: Goal: Postoperative complications will be avoided or minimized Outcome: Progressing   Problem: Respiratory: Goal: Respiratory status will improve Outcome: Progressing   Problem: Skin Integrity: Goal: Wound healing without signs and symptoms of infection Outcome: Progressing Goal: Risk for impaired skin integrity will decrease Outcome: Progressing   Problem: Urinary Elimination: Goal: Ability to achieve and maintain adequate renal perfusion and functioning will improve Outcome: Progressing

## 2023-05-27 NOTE — Progress Notes (Signed)
Patient arrived in the unit from [facility], patient, pt is alert and oriented*4, Vital signs taken, CCMD notified, call bell in reach,   05/27/23 0954  Vitals  Temp 98.8 F (37.1 C)  Temp Source Oral  BP 136/75  MAP (mmHg) 91  BP Location Left Arm  BP Method Automatic  Patient Position (if appropriate) Lying  Pulse Rate 76  Pulse Rate Source Monitor  ECG Heart Rate 77  Resp 20  Level of Consciousness  Level of Consciousness Alert  MEWS COLOR  MEWS Score Color Green  Oxygen Therapy  SpO2 93 %  O2 Device Room Air  Pain Assessment  Pain Scale 0-10  Pain Score 9  Pain Type Surgical pain  Pain Location Chest  Pain Orientation Mid  Pain Radiating Towards thigh  Pain Descriptors / Indicators Aching  Pain Frequency Constant  Pain Onset On-going  Patients Stated Pain Goal 0  Pain Intervention(s) Medication (See eMAR)  Multiple Pain Sites No  MEWS Score  MEWS Temp 0  MEWS Systolic 0  MEWS Pulse 0  MEWS RR 0  MEWS LOC 0  MEWS Score 0

## 2023-05-27 NOTE — Progress Notes (Signed)
This RN pulled pacing wires per order and per protocol. 4 beat run of VTACH was noted on the monitor and then went back into NSR. Pt remained hemodynamically stable. VS per protocol.   Christain Sacramento, RN

## 2023-05-28 LAB — GLUCOSE, CAPILLARY
Glucose-Capillary: 153 mg/dL — ABNORMAL HIGH (ref 70–99)
Glucose-Capillary: 120 mg/dL — ABNORMAL HIGH (ref 70–99)
Glucose-Capillary: 123 mg/dL — ABNORMAL HIGH (ref 70–99)
Glucose-Capillary: 152 mg/dL — ABNORMAL HIGH (ref 70–99)

## 2023-05-28 LAB — BASIC METABOLIC PANEL
Anion gap: 12 (ref 5–15)
BUN: 16 mg/dL (ref 8–23)
CO2: 27 mmol/L (ref 22–32)
Calcium: 8.6 mg/dL — ABNORMAL LOW (ref 8.9–10.3)
Chloride: 98 mmol/L (ref 98–111)
Creatinine, Ser: 0.86 mg/dL (ref 0.61–1.24)
GFR, Estimated: >60 mL/min (ref >60)
Glucose, Bld: 143 mg/dL — ABNORMAL HIGH (ref 70–99)
Potassium: 4.3 mmol/L (ref 3.5–5.1)
Sodium: 137 mmol/L (ref 135–145)

## 2023-05-28 LAB — CBC
HCT: 34.1 % — ABNORMAL LOW (ref 39.0–52.0)
Hemoglobin: 11.2 g/dL — ABNORMAL LOW (ref 13.0–17.0)
MCH: 29.9 pg (ref 26.0–34.0)
MCHC: 32.8 g/dL (ref 30.0–36.0)
MCV: 91.2 fL (ref 80.0–100.0)
Platelets: 139 10*3/uL — ABNORMAL LOW (ref 150–400)
RBC: 3.74 MIL/uL — ABNORMAL LOW (ref 4.22–5.81)
RDW: 13.2 % (ref 11.5–15.5)
WBC: 9.3 10*3/uL (ref 4.0–10.5)
nRBC: 0 % (ref 0.0–0.2)

## 2023-05-28 MED ORDER — ASPIRIN 325 MG PO TBEC
325.0000 mg | DELAYED_RELEASE_TABLET | Freq: Every day | ORAL | Status: DC
  Administered 2023-05-28 – 2023-05-29 (×2): 325 mg via ORAL
  Filled 2023-05-28: qty 1

## 2023-05-28 MED ORDER — TORSEMIDE 20 MG PO TABS
40.0000 mg | ORAL_TABLET | Freq: Every day | ORAL | Status: DC
  Administered 2023-05-28: 40 mg via ORAL
  Filled 2023-05-28: qty 2

## 2023-05-28 MED ORDER — ~~LOC~~ CARDIAC SURGERY, PATIENT & FAMILY EDUCATION: Freq: Once | Status: AC

## 2023-05-28 MED ORDER — SODIUM CHLORIDE 0.9 % IV SOLN: 250.0000 mL | INTRAVENOUS | Status: AC | PRN

## 2023-05-28 MED ORDER — POTASSIUM CHLORIDE CRYS ER 20 MEQ PO TBCR
20.0000 meq | EXTENDED_RELEASE_TABLET | Freq: Two times a day (BID) | ORAL | Status: DC
  Administered 2023-05-28 (×2): 20 meq via ORAL
  Filled 2023-05-28 (×2): qty 1

## 2023-05-28 MED ORDER — SODIUM CHLORIDE 0.9% FLUSH: 3.0000 mL | INTRAVENOUS | Status: DC | PRN

## 2023-05-28 MED ORDER — SODIUM CHLORIDE 0.9% FLUSH
3.0000 mL | Freq: Two times a day (BID) | INTRAVENOUS | Status: DC
  Administered 2023-05-28 (×2): 3 mL via INTRAVENOUS

## 2023-05-28 NOTE — Plan of Care (Signed)

## 2023-05-28 NOTE — Progress Notes (Signed)
      [address]       [place] [address]             [phone]      3 Days Post-Op Procedure(s) (LRB): AORTIC VALVE REPLACEMENT (AVR) USING INSPIRIS AORTIC VALVE SIZE 27 MM (N/A) TRANSESOPHAGEAL ECHOCARDIOGRAM (TEE) (N/A) Subjective: Transferred from ICU yesterday. Sitting up in bed, feels good, no complaints.   On RA BM x 2 since surgery  Objective: Vital signs in last 24 hours: Temp:  [97.8 F (36.6 C)-99.1 F (37.3 C)] 98.7 F (37.1 C) (02/13 0258) Pulse Rate:  [64-110] 64 (02/13 0258) Cardiac Rhythm: Normal sinus rhythm;Bundle branch block (02/12 0958) Resp:  [17-20] 20 (02/13 0258) BP: (100-159)/(61-90) 125/88 (02/13 0258) SpO2:  [89 %-98 %] 98 % (02/13 0258) Weight:  [101.1 kg] 101.1 kg (02/13 0258)     Intake/Output from previous day: 02/12 0701 - 02/13 0700 In: 600 [P.O.:600] Out: 4275 [Urine:4275] Intake/Output this shift: No intake/output data recorded.  General appearance: alert, cooperative, and no distress Neurologic: intact Heart: RRR, no arrhythmias since transfer Lungs: coarse breath sounds. On RA with normal work of breathing. Abdomen: soft, no tenderness Extremities: no peripheral edema Wound: covered with a dry dressing.  Lab Results: Recent Labs    05/26/23 1821 05/27/23 0310  WBC 11.9* 12.9*  HGB 12.4* 11.4*  HCT 37.5* 35.2*  PLT 107* 110*   BMET:  Recent Labs    05/26/23 1821 05/27/23 0310  NA 131* 131*  K 4.3 4.0  CL 94* 96*  CO2 24 23  GLUCOSE 198* 179*  BUN 10 11  CREATININE 1.15 1.12  CALCIUM 8.7* 8.4*    PT/INR:  Recent Labs    05/25/23 1235  LABPROT 17.5*  INR 1.4*   ABG    Component Value Date/Time   PHART 7.316 (L) 05/25/2023 1534   HCO3 22.8 05/25/2023 1534   TCO2 24 05/25/2023 1534   ACIDBASEDEF 4.0 (H) 05/25/2023 1534   O2SAT 91 05/25/2023 1534   CBG (last 3)  Recent Labs    05/27/23 1617 05/27/23 2103 05/28/23 0613  GLUCAP 102* 150* 153*    Assessment/Plan: S/P  Procedure(s) (LRB): AORTIC VALVE REPLACEMENT (AVR) USING INSPIRIS AORTIC VALVE SIZE 27 MM (N/A) TRANSESOPHAGEAL ECHOCARDIOGRAM (TEE) (N/A)  -POD3 aortic valve replacement for severe bicuspid aortic valve stenosis. VS and cardiac rhythm stable. Progressing with mobility. On ASA, Coreg.  -Atrial fibrillation early post-op- stable SR, on oral amiodarone. K+ 4.0 yesterday.  -RENAL- normal function, doubt accuracy of weight (12lbs below pre-op). Continue oral Lasix another day.   -ENDO- No h/o DM but A1C was 6.0 on admission. CBG's well controlled on Semglee 20u daily.   -H/O HTN- back on his carvedilol at 3.125mg  BID. BP 100-120's past 12 hours.  -DVT PPX- on enoxaparin, ambulating.     LOS: 13 days    Leary Roca, PA-C 05/28/2023

## 2023-05-28 NOTE — Progress Notes (Signed)
Mobility Specialist Progress Note:    05/28/23 1024  Mobility  Activity Ambulated independently in hallway  Level of Assistance Standby assist, set-up cues, supervision of patient - no hands on  Assistive Device None  Distance Ambulated (ft) 1400 ft  RUE Weight Bearing Per Provider Order NWB  LUE Weight Bearing Per Provider Order NWB  Activity Response Tolerated well  Mobility Referral Yes  Mobility visit 1 Mobility  Mobility Specialist Start Time (ACUTE ONLY) 1010  Mobility Specialist Stop Time (ACUTE ONLY) 1024  Mobility Specialist Time Calculation (min) (ACUTE ONLY) 14 min   Pt received in bed, agreeable to mobility session. Followed sternal precautions for sitting EOB. Ambulated independently in hallway, SBA for safety. Tolerated well, asx throughout. VSS during session. Returned pt to room, left with all needs met, call bell and phone in reach.   Feliciana Rossetti Mobility Specialist Please contact via Special educational needs teacher or  Rehab office at [phone]

## 2023-05-28 NOTE — Progress Notes (Signed)
CARDIAC REHAB PHASE I    Pt has just completed walk in hallway with mobility. Mobilizing well independently no assist device needed. Post OHS education including site care, restrictions, heart healthy diet, sternal precautions, IS use at home, home needs at discharge, exercise guidelines and CRP2 reviewed. All questions and concerns addressed. Will refer to [facility] for CRP2.   [id number] Woodroe Chen, RN BSN 05/28/2023 11:02 AM

## 2023-05-29 LAB — GLUCOSE, CAPILLARY: Glucose-Capillary: 140 mg/dL — ABNORMAL HIGH (ref 70–99)

## 2023-05-29 MED ORDER — ACETAMINOPHEN 325 MG PO TABS: 650.0000 mg | ORAL_TABLET | Freq: Four times a day (QID) | ORAL | Status: AC | PRN

## 2023-05-29 MED ORDER — AMIODARONE HCL 200 MG PO TABS: 400.0000 mg | ORAL_TABLET | Freq: Two times a day (BID) | ORAL | 1 refills | Status: DC

## 2023-05-29 MED ORDER — ASPIRIN 325 MG PO TBEC: 325.0000 mg | DELAYED_RELEASE_TABLET | Freq: Every day | ORAL | 2 refills | Status: DC

## 2023-05-29 MED ORDER — OXYCODONE HCL 5 MG PO TABS: 5.0000 mg | ORAL_TABLET | Freq: Four times a day (QID) | ORAL | 0 refills | Status: AC | PRN

## 2023-05-29 NOTE — Progress Notes (Signed)
4 Days Post-Op Procedure(s) (LRB): AORTIC VALVE REPLACEMENT (AVR) USING INSPIRIS AORTIC VALVE SIZE 27 MM (N/A) TRANSESOPHAGEAL ECHOCARDIOGRAM (TEE) (N/A) Subjective: No complaints. Slept well, ambulating well, bowels working. Wants to go home later today with his sister who is a Engineer, civil (consulting).  Objective: Vital signs in last 24 hours: Temp:  [97.8 F (36.6 C)-99.4 F (37.4 C)] 97.9 F (36.6 C) (02/14 0416) Pulse Rate:  [55-74] 55 (02/14 0416) Cardiac Rhythm: Heart block (02/13 2027) Resp:  [14-18] 18 (02/14 0416) BP: (112-144)/(60-95) 115/77 (02/14 0416) SpO2:  [91 %-98 %] 92 % (02/14 0416) Weight:  [100.5 kg] 100.5 kg (02/14 0500)  Hemodynamic parameters for last 24 hours:    Intake/Output from previous day: 02/13 0701 - 02/14 0700 In: -  Out: 600 [Urine:600] Intake/Output this shift: No intake/output data recorded.  General appearance: alert and cooperative Neurologic: intact Heart: regular rate and rhythm Lungs: clear to auscultation bilaterally Extremities: no edema Wound: incisions healing well  Lab Results: Recent Labs    05/27/23 0310 05/28/23 1107  WBC 12.9* 9.3  HGB 11.4* 11.2*  HCT 35.2* 34.1*  PLT 110* 139*   BMET:  Recent Labs    05/27/23 0310 05/28/23 1107  NA 131* 137  K 4.0 4.3  CL 96* 98  CO2 23 27  GLUCOSE 179* 143*  BUN 11 16  CREATININE 1.12 0.86  CALCIUM 8.4* 8.6*    PT/INR: No results for input(s): "LABPROT", "INR" in the last 72 hours. ABG    Component Value Date/Time   PHART 7.316 (L) 05/25/2023 1534   HCO3 22.8 05/25/2023 1534   TCO2 24 05/25/2023 1534   ACIDBASEDEF 4.0 (H) 05/25/2023 1534   O2SAT 91 05/25/2023 1534   CBG (last 3)  Recent Labs    05/28/23 1746 05/28/23 2130 05/29/23 0641  GLUCAP 123* 152* 140*    Assessment/Plan: S/P Procedure(s) (LRB): AORTIC VALVE REPLACEMENT (AVR) USING INSPIRIS AORTIC VALVE SIZE 27 MM (N/A) TRANSESOPHAGEAL ECHOCARDIOGRAM (TEE) (N/A)  POD 4  Hemodynamically stable in sinus  rhythm. Continue Coreg and amio for postop atrial fib. Would continue Loading for a week and then taper. Will hold on his other preop antihypertensives and I will resume lisinopril when I see him back.  Wt is below preop. No further diuretics needed.   Glucose control is ok. He was not on any meds preop and Hgb A1c 6.0 so I think he just needs to watch his diet and have followup.  Plan home today with his sister.   LOS: 14 days    Alleen Borne 05/29/2023

## 2023-05-29 NOTE — Progress Notes (Signed)
Discharge instructions reviewed with pt.  Copy of instructions given to pt. Pt informed his scripts were sent to his pharmacy for pick up. Pt's ride here at the front entrance.  Pt will be d/c'd via wheelchair with belongings, and will be escorted by hospital volunteer.   Annice Needy, RN SWOT

## 2023-05-29 NOTE — Progress Notes (Signed)
CARDIAC REHAB PHASE I      Pt ready for discharge home. Postop OHS education completed. Referral for CRP2 sent to [facility].    Woodroe Chen, RN BSN 05/29/2023 11:04 AM

## 2023-06-01 ENCOUNTER — Telehealth: Payer: Self-pay

## 2023-06-01 NOTE — Transitions of Care (Post Inpatient/ED Visit) (Signed)
   06/01/2023  Name: Joshua [name] MRN: [id number] DOB: 06/21/1960  Today's TOC FU Call Status: Today's TOC FU Call Status:: Successful TOC FU Call Completed TOC FU Call Complete Date: 06/01/23 Patient's Name and Date of Birth confirmed.  Transition Care Management Follow-up Telephone Call Date of Discharge: 05/29/23 Discharge Facility: [facility]) Type of Discharge: Inpatient Admission Primary Inpatient Discharge Diagnosis:: acute on chronic systolic CHF How have you been since you were released from the hospital?: Better Any questions or concerns?: No  Items Reviewed: Did you receive and understand the discharge instructions provided?: Yes Medications obtained,verified, and reconciled?: Partial Review Completed Reason for Partial Mediation Review: He said he has all of his medications except the robaxin and he is requesting a refill from Dr Laural Benes. I explained that Dr Laural Benes did not order it but can let her know of his request however, there is no guarantee that she will order it. Any new allergies since your discharge?: No Dietary orders reviewed?: Yes Type of Diet Ordered:: heart healthy low sodium Do you have support at home?: Yes People in Home: sibling(s) Name of Support/Comfort Primary Source: his sister is an Charity fundraiser and is staying with him  Medications Reviewed Today: Medications Reviewed Today   Medications were not reviewed in this encounter     Home Care and Equipment/Supplies: Were Home Health Services Ordered?: No Any new equipment or medical supplies ordered?: No  Functional Questionnaire: Do you need assistance with bathing/showering or dressing?: No Do you need assistance with meal preparation?: No Do you need assistance with eating?: No Do you have difficulty maintaining continence: No Do you need assistance with getting out of bed/getting out of a chair/moving?: No Do you have difficulty managing or taking your medications?: No  Follow up  appointments reviewed: PCP Follow-up appointment confirmed?: No (He said he will call when he is ready to schedule an appointment) MD Provider Line [phone] Given: No Specialist Hospital Follow-up appointment confirmed?: Yes Date of Specialist follow-up appointment?: 06/04/23 Follow-Up Specialty Provider:: CTS; 06/15/2023- cardiology; 07/01/2023- [facility] Do you need transportation to your follow-up appointment?: No Do you understand care options if your condition(s) worsen?: Yes-patient verbalized understanding    SIGNATURE Robyne Peers, RN

## 2023-06-01 NOTE — Telephone Encounter (Signed)
Dr Laural Benes - From [facility] call:  He said he has all of his medications except the robaxin and he is requesting a refill from Dr Laural Benes. I explained that Dr Laural Benes did not order it but can let her know of his request however, there is no guarantee that she will order it.

## 2023-06-02 NOTE — Telephone Encounter (Signed)
I tried to reach the patient to share Dr Henriette Combs message with him.Marland Kitchen His sister, Dewayne Hatch, answered and said he was not there.  DPR on file to speak with her and I explained why I was calling and she said she understood.  She said he can contact orthopedics or discuss with Dr Laural Benes at an upcoming appointment

## 2023-06-04 ENCOUNTER — Ambulatory Visit: Payer: Self-pay

## 2023-06-04 ENCOUNTER — Telehealth (HOSPITAL_COMMUNITY): Payer: Self-pay

## 2023-06-04 DIAGNOSIS — Z4802 Encounter for removal of sutures: Secondary | ICD-10-CM

## 2023-06-04 NOTE — Progress Notes (Signed)
Patient arrived for nurse visit to remove suture post- procedure AVR with Dr. Laneta Simmers 05/25/23.  Two sutures removed with no signs/ symptoms of infection noted.  Patient tolerated procedure well. Incisions well approximated. Patient advised to continue using betadine swabs at incision sites.  Patient instructed to keep the incision sites clean and dry.  Patient acknowledged instructions given.

## 2023-06-04 NOTE — Telephone Encounter (Signed)
 Attempted to call patient in regards to Cardiac Rehab - LM on VM

## 2023-06-09 ENCOUNTER — Other Ambulatory Visit: Payer: Self-pay

## 2023-06-09 ENCOUNTER — Encounter (HOSPITAL_COMMUNITY): Payer: Self-pay

## 2023-06-09 ENCOUNTER — Emergency Department (HOSPITAL_COMMUNITY): Payer: Medicaid Other

## 2023-06-09 ENCOUNTER — Emergency Department (HOSPITAL_COMMUNITY)
Admission: EM | Admit: 2023-06-09 | Discharge: 2023-06-09 | Discharge status: Against Medical Advice | Payer: Medicaid Other

## 2023-06-09 DIAGNOSIS — R0789 Other chest pain: Secondary | ICD-10-CM | POA: Diagnosis not present

## 2023-06-09 DIAGNOSIS — I10 Essential (primary) hypertension: Secondary | ICD-10-CM

## 2023-06-09 DIAGNOSIS — R072 Precordial pain: Secondary | ICD-10-CM | POA: Insufficient documentation

## 2023-06-09 DIAGNOSIS — R0602 Shortness of breath: Secondary | ICD-10-CM | POA: Diagnosis not present

## 2023-06-09 DIAGNOSIS — I2510 Atherosclerotic heart disease of native coronary artery without angina pectoris: Secondary | ICD-10-CM

## 2023-06-09 DIAGNOSIS — Z5321 Procedure and treatment not carried out due to patient leaving prior to being seen by health care provider: Secondary | ICD-10-CM | POA: Diagnosis not present

## 2023-06-09 DIAGNOSIS — R600 Localized edema: Secondary | ICD-10-CM | POA: Diagnosis not present

## 2023-06-09 LAB — CBC
HCT: 35.5 % — ABNORMAL LOW (ref 39.0–52.0)
Hemoglobin: 11.2 g/dL — ABNORMAL LOW (ref 13.0–17.0)
MCH: 29.1 pg (ref 26.0–34.0)
MCHC: 31.5 g/dL (ref 30.0–36.0)
MCV: 92.2 fL (ref 80.0–100.0)
Platelets: 235 10*3/uL (ref 150–400)
RBC: 3.85 MIL/uL — ABNORMAL LOW (ref 4.22–5.81)
RDW: 14.5 % (ref 11.5–15.5)
WBC: 7.8 10*3/uL (ref 4.0–10.5)
nRBC: 0 % (ref 0.0–0.2)

## 2023-06-09 LAB — BASIC METABOLIC PANEL
Anion gap: 11 (ref 5–15)
BUN: 13 mg/dL (ref 8–23)
CO2: 23 mmol/L (ref 22–32)
Calcium: 8.5 mg/dL — ABNORMAL LOW (ref 8.9–10.3)
Chloride: 102 mmol/L (ref 98–111)
Creatinine, Ser: 0.98 mg/dL (ref 0.61–1.24)
GFR, Estimated: >60 mL/min (ref >60)
Glucose, Bld: 105 mg/dL — ABNORMAL HIGH (ref 70–99)
Potassium: 4.4 mmol/L (ref 3.5–5.1)
Sodium: 136 mmol/L (ref 135–145)

## 2023-06-09 LAB — TROPONIN I (HIGH SENSITIVITY): Troponin I (High Sensitivity): 43 ng/L — ABNORMAL HIGH (ref <18)

## 2023-06-09 LAB — BRAIN NATRIURETIC PEPTIDE: B Natriuretic Peptide: 516.2 pg/mL — ABNORMAL HIGH (ref 0.0–100.0)

## 2023-06-09 MED ORDER — POTASSIUM CHLORIDE CRYS ER 20 MEQ PO TBCR: 20.0000 meq | EXTENDED_RELEASE_TABLET | Freq: Every day | ORAL | 0 refills | Status: AC

## 2023-06-09 MED ORDER — AMIODARONE HCL 200 MG PO TABS: 200.0000 mg | ORAL_TABLET | Freq: Two times a day (BID) | ORAL | 1 refills | Status: AC

## 2023-06-09 MED ORDER — ASPIRIN 325 MG PO TBEC: 325.0000 mg | DELAYED_RELEASE_TABLET | Freq: Every day | ORAL | 2 refills | Status: DC

## 2023-06-09 MED ORDER — CARVEDILOL 3.125 MG PO TABS: 3.1250 mg | ORAL_TABLET | Freq: Two times a day (BID) | ORAL | 0 refills | Status: DC

## 2023-06-09 MED ORDER — FUROSEMIDE 40 MG PO TABS: 40.0000 mg | ORAL_TABLET | Freq: Every day | ORAL | 0 refills | Status: DC

## 2023-06-09 NOTE — ED Notes (Signed)
 Pt states he is going somewhere else and left

## 2023-06-09 NOTE — ED Provider Triage Note (Signed)
 Emergency Medicine Provider Triage Evaluation Note  Joshua [name] , a 63 y.o. male  was evaluated in triage.  Pt complains of chest pain.  Started 7 AM this morning.  It is substernal sternal but otherwise nonradiating.  Endorses some shortness of breath as well.  Also endorsing bilateral lower leg edema that started yesterday.  Patient had a mitral valve replacement on for 40 10th.  Review of Systems  Positive: See above Negative: See above  Physical Exam  BP 129/84   Pulse 66   Temp 97.7 F (36.5 C)   Resp 20   Ht 6\' 1"  (1.854 m)   Wt 111.1 kg   SpO2 95%   BMI 32.32 kg/m  Gen:   Awake, no distress   Resp:  Normal effort  MSK:   Moves extremities without difficulty  Other:    Medical Decision Making  Medically screening exam initiated at 9:32 AM.  Appropriate orders placed.  Vania Rea Lover was informed that the remainder of the evaluation will be completed by another provider, this initial triage assessment does not replace that evaluation, and the importance of remaining in the ED until their evaluation is complete.  Work up started   Gareth Eagle, [place] 06/09/23 [phone]

## 2023-06-09 NOTE — ED Triage Notes (Addendum)
 Pt BIB EMS for substernal chest pain that started at 0700. C/O bilateral leg edema that started yesterday. 2/10 pt had mitral valve replacement. VSS. Axox4. Pt had 324mg  of aspirin.

## 2023-06-12 NOTE — Progress Notes (Deleted)
 Cardiology Clinic Note   Patient Name: MARITZA GOLDSBOROUGH Date of Encounter: 06/12/2023  Primary Care Provider:  Marcine Matar, MD Primary Cardiologist:  Thurmon Fair, MD  Patient Profile    Kaydon Husby Toutant 63 year old male presents the clinic today for follow-up evaluation status post aortic valve replacement.  Past Medical History    Past Medical History:  Diagnosis Date   Aortic stenosis    CAD (coronary artery disease), native coronary artery 12/26/2016   DES LAD & RCA, EF 25-30%   CHF (congestive heart failure) (HCC)    COVID-19 vaccine regimen to maintain immunity completed 04/27/2020   Dermatitis 01/07/2023   Dyslipidemia    Hypertension    Obesity    Osteoarthritis    Pre-diabetes    a. prior A1C 5.6, states he was on meds for this at one point.   Skin lesions 01/06/2023   Past Surgical History:  Procedure Laterality Date   AORTIC VALVE REPLACEMENT N/A 05/25/2023   Procedure: AORTIC VALVE REPLACEMENT (AVR) USING INSPIRIS AORTIC VALVE SIZE 27 MM;  Surgeon: Alleen Borne, MD;  Location: [facility] OR;  Service: Open Heart Surgery;  Laterality: N/A;   CORONARY STENT INTERVENTION N/A 12/29/2016   Procedure: CORONARY STENT INTERVENTION;  Surgeon: Corky Crafts, MD;  Location: [facility] INVASIVE CV LAB;  Service: Cardiovascular;  Laterality: N/A;   IR THORACENTESIS ASP PLEURAL SPACE W/IMG GUIDE  05/19/2023   RIGHT HEART CATH AND CORONARY ANGIOGRAPHY N/A 05/18/2023   Procedure: RIGHT HEART CATH AND CORONARY ANGIOGRAPHY;  Surgeon: Yvonne Kendall, MD;  Location: [facility] INVASIVE CV LAB;  Service: Cardiovascular;  Laterality: N/A;   RIGHT/LEFT HEART CATH AND CORONARY ANGIOGRAPHY N/A 12/29/2016   Procedure: RIGHT/LEFT HEART CATH AND CORONARY ANGIOGRAPHY;  Surgeon: Corky Crafts, MD;  Location: [facility] INVASIVE CV LAB;  Service: Cardiovascular;  Laterality: N/A;   SHOULDER ARTHROSCOPY WITH ROTATOR CUFF REPAIR AND OPEN BICEPS TENODESIS Right 11/28/2021   Procedure: RIGHT  SHOULDER ARTHROSCOPY WITH ROTATOR CUFF REPAIR AND OPEN BICEPS TENODESIS;  Surgeon: Huel Cote, MD;  Location: [facility] OR;  Service: Orthopedics;  Laterality: Right;   SHOULDER ARTHROSCOPY WITH ROTATOR CUFF REPAIR AND OPEN BICEPS TENODESIS Left 07/21/2022   Procedure: LEFT SHOULDER ARTHROSCOPY WITH ROTATOR CUFF REPAIR AND BICEPS TENODESIS;  Surgeon: Huel Cote, MD;  Location: [facility] OR;  Service: Orthopedics;  Laterality: Left;   TEE WITHOUT CARDIOVERSION N/A 05/25/2023   Procedure: TRANSESOPHAGEAL ECHOCARDIOGRAM (TEE);  Surgeon: Alleen Borne, MD;  Location: [facility] OR;  Service: Open Heart Surgery;  Laterality: N/A;   TONSILLECTOMY     TOTAL HIP ARTHROPLASTY Right 04/26/2018   Procedure: RIGHT TOTAL HIP ARTHROPLASTY ANTERIOR APPROACH;  Surgeon: Tarry Kos, MD;  Location: [facility] OR;  Service: Orthopedics;  Laterality: Right;   TOTAL HIP ARTHROPLASTY Left 11/15/2018   TOTAL HIP ARTHROPLASTY Left 11/15/2018   Procedure: LEFT TOTAL HIP ARTHROPLASTY ANTERIOR APPROACH;  Surgeon: Tarry Kos, MD;  Location: [facility] OR;  Service: Orthopedics;  Laterality: Left;    Allergies  Allergies  Allergen Reactions   Statins Other (See Comments)    Elevated LFTs    History of Present Illness    Erwin Nishiyama Hendel has a PMH of acute decompensated systolic CHF EF 30%, mild LVH, mild AI with critical aortic stenosis, nonobstructive CAD, hyperlipidemia, HTN, and history of complete heart block.  He was admitted on 05/15/2023 and discharged on 05/29/2023.  His echocardiogram 05/18/2023 showed an LVEF of 30-35%, mild LVH, global hypokinesis, moderate left and right pleural effusion.  He was noted to have critical aortic stenosis with a mean gradient of 50 mmHg and peak gradient of 80 mmHg.  He underwent left and right cardiac catheterization 05/18/2023 which showed mild nonobstructive coronary artery disease.  He was noted to have severely elevated left and right heart pressures as well as pulmonary artery pressures.  His IV  furosemide was resumed.  He underwent CTA which showed pulmonary edema and moderate bilateral pleural effusions.  He underwent successful ultrasound-guided right thoracentesis with removal of 1.3 L of reddish fluid.  Fluid was sent for cytology.  Options for treatment of his aortic valve were discussed.  He agreed to proceed with aortic valve replacement.  He presented to the operating room 05/25/23 and received 27 mm Medtronic bovine aortic valve.  He tolerated the surgery well.  He was noted to have brief episodes of atrial fibrillation postoperatively and was started on oral amiodarone.  He progressed well postoperatively and was transition to 4 E. progressive care on postop day 2.  He was not noted to have further cardiac arrhythmias.  He was discharged home on postop day 4.  His sister was assisting with recovery.  She is an Charity fundraiser.  His blood pressure at discharge was noted to be 115/77.  Pulse was 55.  He was discharged home in stable condition.  He presents to the clinic today for follow-up evaluation and states***.  *** denies chest pain, shortness of breath, lower extremity edema, fatigue, palpitations, melena, hematuria, hemoptysis, diaphoresis, weakness, presyncope, syncope, orthopnea, and PND.  Systolic and diastolic CHF-slowly progressing physical activity.  Maintaining sternal precautions.  Echocardiogram 10/24 showed an LVEF of 55%.  His echocardiogram showed an LVEF of 30-35% with recent hospitalization.  He was noted to have critical aortic stenosis.  He received IV diuresis and his aortic valve was replaced. Continue carvedilol, furosemide Start losartan 25 mg daily. Heart healthy low-sodium diet Maintain sternal precautions Repeat BMP in 1-2 weeks Will plan for repeat echocardiogram once GDMT has been optimized x 1 month.  Severe aortic stenosis-he is status post aortic valve replacement on 05/25/2023 and continues to progress well.  Surgical sites healing well with no signs of  infection. Continue sternal precautions Follow-up with Dr. Lavinia Sharps as scheduled  Coronary artery disease-does note surgical soreness.  Denies chest pressure and tightness.  Reassuring left and right heart cath.  He was noted to have mild nonobstructive disease. Continue carvedilol, Repatha, aspirin  Complete heart block-denies further episodes of irregular heartbeat, presyncope, syncope, and accelerated heart rates. Continue amiodarone, carvedilol  Hyperlipidemia-LDL***. High-fiber diet Continue Repatha  Essential hypertension-BP today***. Continue carvedilol Low-sodium diet  Disposition: Follow-up with Dr. Royann Shivers or me in 1-2 months.  Home Medications    Prior to Admission medications   Medication Sig Start Date End Date Taking? Authorizing Provider  acetaminophen (TYLENOL) 325 MG tablet Take 2 tablets (650 mg total) by mouth every 6 (six) hours as needed. 05/29/23   Leary Roca, PA-C  amiodarone (PACERONE) 200 MG tablet Take 1 tablet (200 mg total) by mouth 2 (two) times daily. For 7 days then decrease the dose to 1 tablet (200mg ) once daily. 06/09/23   Gold, Glenice Laine, PA-C  amoxicillin (AMOXIL) 500 MG tablet Take 4 tablets (2,000 mg total) by mouth once as needed for up to 1 dose (Take one hour prior to dental procedure). 01/30/23   Croitoru, Mihai, MD  aspirin EC 325 MG tablet Take 1 tablet (325 mg total) by mouth daily. 06/09/23   Gershon Crane  E, PA-C  carvedilol (COREG) 3.125 MG tablet Take 1 tablet (3.125 mg total) by mouth 2 (two) times daily. 06/09/23   Gold, Wayne E, PA-C  Evolocumab (REPATHA SURECLICK) 140 MG/ML SOAJ INJECT 1 ML INTO THE SKIN EVERY 14 (FOURTEEN) DAYS. 11/10/22   Croitoru, Mihai, MD  fluticasone (FLONASE) 50 MCG/ACT nasal spray PLACE 1 SPRAY INTO BOTH NOSTRILS DAILY AS NEEDED FOR ALLERGIES OR RHINITIS. 09/01/22   Marcine Matar, MD  furosemide (LASIX) 40 MG tablet Take 1 tablet (40 mg total) by mouth daily for 10 days. 06/09/23 06/19/23  Rowe Clack,  PA-C  loratadine (CLARITIN) 10 MG tablet Take 10 mg by mouth daily as needed for allergies.    [provider]  methocarbamol (ROBAXIN) 500 MG tablet Take 1 tablet (500 mg total) by mouth 2 (two) times daily as needed for muscle spasms. 10/01/21   Cristie Hem, PA-C  potassium chloride SA (KLOR-CON M) 20 MEQ tablet Take 1 tablet (20 mEq total) by mouth daily for 10 days. 06/09/23 06/19/23  Rowe Clack, PA-C    Family History    Family History  Problem Relation Age of Onset   CAD Father        MI/CABG age 60   He indicated that his mother is deceased. He indicated that his father is deceased.  Social History    Social History   Socioeconomic History   Marital status: Single    Spouse name: Not on file   Number of children: 0   Years of education: Not on file   Highest education level: Not on file  Occupational History   Occupation: Retired-Upholstery  Tobacco Use   Smoking status: Former    Current packs/day: 0.00    Types: Cigarettes    Quit date: 08/18/2000    Years since quitting: 22.8    Passive exposure: Past   Smokeless tobacco: Never   Tobacco comments:    Smoked lightly for approx 20 years, quit 1990s  Vaping Use   Vaping status: Never Used  Substance and Sexual Activity   Alcohol use: Yes    Comment: occasionally   Drug use: Yes    Types: Marijuana    Comment: every night (to sleep)   Sexual activity: Not on file  Other Topics Concern   Not on file  Social History Narrative   Lives Home alone. Sister makes all health related decisions. Declined Advanced directive at this time   Social Drivers of Corporate investment banker Strain: Not on file  Food Insecurity: No Food Insecurity (05/16/2023)   Hunger Vital Sign    Worried About Running Out of Food in the Last Year: Never true    Ran Out of Food in the Last Year: Never true  Transportation Needs: No Transportation Needs (05/16/2023)   PRAPARE - Administrator, Civil Service (Medical):  No    Lack of Transportation (Non-Medical): No  Physical Activity: Not on file  Stress: Not on file  Social Connections: Not on file  Intimate Partner Violence: Not At Risk (05/16/2023)   Humiliation, Afraid, Rape, and Kick questionnaire    Fear of Current or Ex-Partner: No    Emotionally Abused: No    Physically Abused: No    Sexually Abused: No     Review of Systems    General:  No chills, fever, night sweats or weight changes.  Cardiovascular:  No chest pain, dyspnea on exertion, edema, orthopnea, palpitations, paroxysmal nocturnal dyspnea. Dermatological: No  rash, lesions/masses Respiratory: No cough, dyspnea Urologic: No hematuria, dysuria Abdominal:   No nausea, vomiting, diarrhea, bright red blood per rectum, melena, or hematemesis Neurologic:  No visual changes, wkns, changes in mental status. All other systems reviewed and are otherwise negative except as noted above.  Physical Exam    VS:  There were no vitals taken for this visit. , BMI There is no height or weight on file to calculate BMI. GEN: Well nourished, well developed, in no acute distress. HEENT: normal. Neck: Supple, no JVD, carotid bruits, or masses. Cardiac: RRR, no murmurs, rubs, or gallops. No clubbing, cyanosis, edema.  Radials/DP/PT 2+ and equal bilaterally.  Respiratory:  Respirations regular and unlabored, clear to auscultation bilaterally. GI: Soft, nontender, nondistended, BS + x 4. MS: no deformity or atrophy. Skin: warm and dry, no rash. Neuro:  Strength and sensation are intact. Psych: Normal affect.  Accessory Clinical Findings    Recent Labs: 05/25/2023: ALT 36 05/26/2023: Magnesium 1.9 06/09/2023: B Natriuretic Peptide 516.2; BUN 13; Creatinine, Ser 0.98; Hemoglobin 11.2; Platelets 235; Potassium 4.4; Sodium 136   Recent Lipid Panel    Component Value Date/Time   CHOL 103 01/02/2023 0915   TRIG 69 01/02/2023 0915   HDL 62 01/02/2023 0915   CHOLHDL 1.7 01/02/2023 0915   CHOLHDL 4.6  12/27/2016 0238   VLDL 18 12/27/2016 0238   LDLCALC 26 01/02/2023 0915    No BP recorded.  {Refresh Note OR Click here to enter BP  :1}***    ECG personally reviewed by me today- ***     Echocardiogram 05/18/2023  IMPRESSIONS     1. Left ventricular ejection fraction, by estimation, is 30 to 35%. The  left ventricle has moderately decreased function. The left ventricle  demonstrates global hypokinesis. There is mild concentric left ventricular  hypertrophy. Indeterminate diastolic  filling due to E-A fusion. Elevated left ventricular end-diastolic  pressure.   2. Right ventricular systolic function is low normal. The right  ventricular size is normal.   3. Left atrial size was moderately dilated.   4. Right atrial size was mildly dilated.   5. Moderate pleural effusion in both left and right lateral regions.   6. The mitral valve is normal in structure. Trivial mitral valve  regurgitation. No evidence of mitral stenosis.   7. The aortic valve is bicuspid. There is severe calcifcation of the  aortic valve. There is moderate thickening of the aortic valve. Aortic  valve regurgitation is mild. Severe aortic valve stenosis. Aortic valve  mean gradient measures 50.0 mmHg. Aortic  valve Vmax measures 4.47 m/s.   8. The inferior vena cava is dilated in size with <50% respiratory  variability, suggesting right atrial pressure of 15 mmHg.   Comparison(s): Changes from prior study are noted. The left ventricular  function is significantly worse.   Conclusion(s)/Recommendation(s): Now with reduced LVEF, continues to have  severe bicuspid aortic stenosis.   FINDINGS   Left Ventricle: Left ventricular ejection fraction, by estimation, is 30  to 35%. The left ventricle has moderately decreased function. The left  ventricle demonstrates global hypokinesis. The left ventricular internal  cavity size was normal in size.  There is mild concentric left ventricular hypertrophy.  Indeterminate  diastolic filling due to E-A fusion. Elevated left ventricular  end-diastolic pressure. The E/e' is 42.   Right Ventricle: The right ventricular size is normal. Right vetricular  wall thickness was not well visualized. Right ventricular systolic  function is low normal.   Left Atrium:  Left atrial size was moderately dilated.   Right Atrium: Right atrial size was mildly dilated.   Pericardium: There is no evidence of pericardial effusion.   Mitral Valve: The mitral valve is normal in structure. Trivial mitral  valve regurgitation. No evidence of mitral valve stenosis. MV peak  gradient, 9.4 mmHg. The mean mitral valve gradient is 4.0 mmHg.   Tricuspid Valve: The tricuspid valve is normal in structure. Tricuspid  valve regurgitation is trivial. No evidence of tricuspid stenosis.   Aortic Valve: The aortic valve is bicuspid. There is severe calcifcation  of the aortic valve. There is moderate thickening of the aortic valve.  Aortic valve regurgitation is mild. Severe aortic stenosis is present.  Aortic valve mean gradient measures  50.0 mmHg. Aortic valve peak gradient measures 80.0 mmHg. Aortic valve  area, by VTI measures 1.15 cm.   Pulmonic Valve: The pulmonic valve was not well visualized. Pulmonic valve  regurgitation is trivial. No evidence of pulmonic stenosis.   Aorta: The aortic root and ascending aorta are structurally normal, with  no evidence of dilitation.   Venous: The inferior vena cava is dilated in size with less than 50%  respiratory variability, suggesting right atrial pressure of 15 mmHg.   IAS/Shunts: The atrial septum is grossly normal.   Right and left heart cath 05/18/2023  Conclusions: Mild, non-obstructive coronary artery disease with 20% proximal RCA stenosis and mild luminal irregularities in the LAD and LCx. Patent mid LAD and proximal RCA stents with mild (~10%) in-stent restenosis. Severe elevated left heart, right heart, and  pulmonary artery pressures (see details below). Severely reduced Fick cardiac output/index.   Recommendations: Escalate diuresis; will transition back to furosemide 40 mg IV BID. Hold carvedilol in the setting of severely reduced cardiac output and decompensated heart failure. Proceed with workup for aortic valve intervention per structural heart team. Continue secondary prevention of coronary artery disease.   Yvonne Kendall, MD [facility]  Right Heart Pressures RA (mean): 14 mmHg RV (S/EDP): 80/15 mmHg PA (S/D, mean): 80/35 (50) mmHg PCWP (mean): 35 mmHg  Ao sat: 84% PA sat: 45%  Fick CO: 3.6 L/min Fick CI: 1.6 L/min/m^2  PVR: 4.2 Wood units   Left Heart  Aortic Valve The aortic valve is calcified.   Coronary Diagrams  Diagnostic Dominance: Right  Intervention  Assessment & Plan   1.  ***   Thomasene Ripple. Diala Waxman NP-C     06/12/2023, 7:01 AM [facility] [address] Office [phone] Fax [phone]    I spent***minutes examining this patient, reviewing medications, and using patient centered shared decision making involving their cardiac care.   I spent  20 minutes reviewing past medical history,  medications, and prior cardiac tests.

## 2023-06-15 ENCOUNTER — Ambulatory Visit: Payer: Medicaid Other | Admitting: General Practice

## 2023-06-30 ENCOUNTER — Other Ambulatory Visit: Payer: Self-pay | Admitting: Surgery

## 2023-06-30 DIAGNOSIS — I2510 Atherosclerotic heart disease of native coronary artery without angina pectoris: Secondary | ICD-10-CM

## 2023-07-01 ENCOUNTER — Ambulatory Visit
Admission: RE | Admit: 2023-07-01 | Discharge: 2023-07-01 | Disposition: A | Discharge status: Home / Self Care | Source: Ambulatory Visit | Attending: Surgery | Admitting: Surgery

## 2023-07-01 ENCOUNTER — Ambulatory Visit (INDEPENDENT_AMBULATORY_CARE_PROVIDER_SITE_OTHER): Payer: Self-pay | Admitting: Physician Assistant

## 2023-07-01 ENCOUNTER — Ambulatory Visit: Payer: Medicaid Other | Admitting: Surgery

## 2023-07-01 ENCOUNTER — Encounter: Payer: Self-pay | Admitting: Physician Assistant

## 2023-07-01 VITALS — BP 155/90 | HR 65 | Resp 18 | Ht 73.0 in | Wt 224.0 lb

## 2023-07-01 DIAGNOSIS — I2510 Atherosclerotic heart disease of native coronary artery without angina pectoris: Secondary | ICD-10-CM

## 2023-07-01 DIAGNOSIS — Z953 Presence of xenogenic heart valve: Secondary | ICD-10-CM

## 2023-07-01 MED ORDER — AMOXICILLIN 500 MG PO TABS: 2000.0000 mg | ORAL_TABLET | Freq: Once | ORAL | 2 refills | Status: AC | PRN

## 2023-07-01 NOTE — Progress Notes (Signed)
 HPI: Mr. Joshua [name] is an 63 year old male with history of chronic systolic and diastolic heart failure, CAD treated with PCI, bicuspid aortic valve with aortic stenosis and insufficiency. Patient returns for routine postoperative follow-up having undergone bioprosthetic aortic valve replacement by Dr. Laneta Simmers on 04/24/23. The patient's early postoperative recovery while in the hospital was notable for postoperative atrial fibrillation treated with amiodarone with conversion back to sinus rhythm prior to discharge. He was discharged on post-op day 4.  Since hospital discharge he feels he has been progressing.  He denies any pain.  He is gradually increasing his activity.  He denies shortness of breath.  Appetite has been normal.  He has not noticed any episodes of palpitations he is taking the amiodarone at 200 mg p.o. daily as prescribed.   Current Outpatient Medications  Medication Sig Dispense Refill   acetaminophen (TYLENOL) 325 MG tablet Take 2 tablets (650 mg total) by mouth every 6 (six) hours as needed.     amiodarone (PACERONE) 200 MG tablet Take 1 tablet (200 mg total) by mouth 2 (two) times daily. For 7 days then decrease the dose to 1 tablet (200mg ) once daily. 60 tablet 1   amoxicillin (AMOXIL) 500 MG tablet Take 4 tablets (2,000 mg total) by mouth once as needed for up to 1 dose (Take one hour prior to dental procedure). 4 tablet 3   aspirin EC 325 MG tablet Take 1 tablet (325 mg total) by mouth daily. 100 tablet 2   carvedilol (COREG) 3.125 MG tablet Take 1 tablet (3.125 mg total) by mouth 2 (two) times daily. 180 tablet 0   Evolocumab (REPATHA SURECLICK) 140 MG/ML SOAJ INJECT 1 ML INTO THE SKIN EVERY 14 (FOURTEEN) DAYS. 6 mL 3   fluticasone (FLONASE) 50 MCG/ACT nasal spray PLACE 1 SPRAY INTO BOTH NOSTRILS DAILY AS NEEDED FOR ALLERGIES OR RHINITIS. 48 mL 0   furosemide (LASIX) 40 MG tablet Take 1 tablet (40 mg total) by mouth daily for 10 days. 10 tablet 0   loratadine (CLARITIN) 10  MG tablet Take 10 mg by mouth daily as needed for allergies.     methocarbamol (ROBAXIN) 500 MG tablet Take 1 tablet (500 mg total) by mouth 2 (two) times daily as needed for muscle spasms. 20 tablet 2   potassium chloride SA (KLOR-CON M) 20 MEQ tablet Take 1 tablet (20 mEq total) by mouth daily for 10 days. 10 tablet 0   No current facility-administered medications for this visit.    Physical Exam: Vital signs BP 155/90 Heart rate 65 Respirations 18 SpO2 95% on room air  General: 63 year old male in no acute distress.  He is in good spirits today. Heart: Regular rate and rhythm, no murmur.   Chest: Sternotomy incision is healing with no sign of complication.  Sternum stable.  Breath sounds are full and equal. Extremities: There is no peripheral edema   Diagnostic Tests: Mr. Lorri Frederick did not get his chest x-ray prior to this appointment but will get it done when he leaves the office.  Impression / Plan: Mr. Rama is about 5-week status post bioprosthetic aortic valve replacement for bicuspid aortic valve stenosis with insufficiency.  Overall he is making progress and then satisfactory recovery with no unexpected complications.  We reviewed importance of observing sternal precautions for another 5 weeks.  After that, you may gradually increase activity without limitation.  He may resume driving.  Medications were reviewed.  He is asked to continue with the amiodarone for another 2 weeks then stop  that medication.  He is to continue all other medications as ordered.  He has some dental procedures planned and requested a prescription for amoxicillin to take prior to the procedures.  This will be sent to his pharmacy in [place].    Leary Roca, PA-C [facility] [phone]

## 2023-07-01 NOTE — Patient Instructions (Signed)
 He may resume driving.  Continue taking the amiodarone till the end of this month then you may discontinue..  Continue to observe sternal precautions with no lifting, pushing, pulling more than 15 pounds for another 5 weeks.  After that, he may gradually increase her activity without limitation.  Follow-up as needed

## 2023-07-06 ENCOUNTER — Encounter (HOSPITAL_COMMUNITY): Payer: Self-pay | Admitting: Cardiovascular Disease

## 2023-07-06 ENCOUNTER — Ambulatory Visit (HOSPITAL_COMMUNITY): Payer: Medicaid Other | Attending: Internal Medicine

## 2023-07-22 ENCOUNTER — Telehealth: Payer: Self-pay | Admitting: Emergency Medicine

## 2023-07-22 NOTE — Telephone Encounter (Signed)
 Faxed Requested Rehab Forms

## 2023-07-27 ENCOUNTER — Other Ambulatory Visit: Payer: Self-pay | Admitting: Internal Medicine

## 2023-07-27 DIAGNOSIS — I10 Essential (primary) hypertension: Secondary | ICD-10-CM

## 2023-08-18 ENCOUNTER — Other Ambulatory Visit (HOSPITAL_COMMUNITY): Payer: Medicaid Other

## 2023-08-19 ENCOUNTER — Other Ambulatory Visit: Payer: Self-pay | Admitting: Surgical

## 2023-08-23 ENCOUNTER — Other Ambulatory Visit: Payer: Self-pay | Admitting: Surgical

## 2023-08-27 ENCOUNTER — Institutional Professional Consult (permissible substitution): Payer: Medicaid Other | Admitting: Cardiovascular Disease

## 2023-10-01 ENCOUNTER — Other Ambulatory Visit: Payer: Self-pay | Admitting: Cardiovascular Disease

## 2023-10-01 DIAGNOSIS — I2510 Atherosclerotic heart disease of native coronary artery without angina pectoris: Secondary | ICD-10-CM

## 2023-10-01 DIAGNOSIS — E785 Hyperlipidemia, unspecified: Secondary | ICD-10-CM

## 2023-10-01 DIAGNOSIS — I350 Nonrheumatic aortic (valve) stenosis: Secondary | ICD-10-CM

## 2023-10-06 ENCOUNTER — Telehealth: Payer: Self-pay | Admitting: Pharmacy Technician

## 2023-10-06 NOTE — Telephone Encounter (Signed)
 Pharmacy Patient Advocate Encounter   Received notification from CoverMyMeds that prior authorization for repatha  140mg  is required/requested.   Insurance verification completed.   The patient is insured through [employer] .   Per test claim: PA required; PA submitted to above mentioned insurance via CoverMyMeds Key/confirmation #/EOC [id number] Status is pending   Pharmacy Patient Advocate Encounter  Received notification from [employer] that Prior Authorization for REPATHA  140MG  has been APPROVED from 04/15/23 to 04/13/24. Spoke to pharmacy to process.Copay is $? [facility] said it shows autoprocessing so no copay coming up for now.    PA #/Case ID/Reference #: [id number]

## 2023-10-21 ENCOUNTER — Other Ambulatory Visit: Payer: Self-pay | Admitting: Internal Medicine

## 2023-10-21 ENCOUNTER — Other Ambulatory Visit: Payer: Self-pay | Admitting: Physician Assistant

## 2023-10-21 DIAGNOSIS — I10 Essential (primary) hypertension: Secondary | ICD-10-CM

## 2023-10-22 ENCOUNTER — Ambulatory Visit (HOSPITAL_BASED_OUTPATIENT_CLINIC_OR_DEPARTMENT_OTHER)
Admission: EM | Admit: 2023-10-22 | Discharge: 2023-10-22 | Disposition: A | Discharge status: Home / Self Care | Attending: Family Medicine | Admitting: Family Medicine

## 2023-10-22 ENCOUNTER — Encounter (HOSPITAL_BASED_OUTPATIENT_CLINIC_OR_DEPARTMENT_OTHER): Payer: Self-pay

## 2023-10-22 DIAGNOSIS — L989 Disorder of the skin and subcutaneous tissue, unspecified: Secondary | ICD-10-CM | POA: Diagnosis not present

## 2023-10-22 DIAGNOSIS — S81801A Unspecified open wound, right lower leg, initial encounter: Secondary | ICD-10-CM | POA: Diagnosis not present

## 2023-10-22 MED ORDER — SULFAMETHOXAZOLE-TRIMETHOPRIM 800-160 MG PO TABS: 1.0000 | ORAL_TABLET | Freq: Two times a day (BID) | ORAL | 0 refills | Status: AC

## 2023-10-22 NOTE — Discharge Instructions (Signed)
 I am treating you for infection on your leg.  Take the antibiotics as prescribed.  Recommend establishing locally with a PCP for possible referral to wound clinic and other referrals if needed.

## 2023-10-22 NOTE — ED Provider Notes (Signed)
 Joshua [name] CARE    CSN: [id number] Arrival date & time: 10/22/23  1724      History   Chief Complaint Chief Complaint  Patient presents with   Wounds    HPI Joshua [name] is a 63 y.o. male.   63 year old male presents with lesions. Patient has hx of thinking he has bugs inside his body. Per sister, patient starts picking at random things and then creates large sores. States behavior has been going on since last fall. Large sore to right lower leg, left side of forehead. Sister is a retired Engineer, civil (consulting) and concerned this is a Print production planner issue. He takes his amoxicillin  prescription that he has on hand when this happens. Has had full work up by Infectious Disease last fall. Could not find any underlying cause. No fever currently.      Past Medical History:  Diagnosis Date   Aortic stenosis    CAD (coronary artery disease), native coronary artery 12/26/2016   DES LAD & RCA, EF 25-30%   CHF (congestive heart failure) (HCC)    COVID-19 vaccine regimen to maintain immunity completed 04/27/2020   Dermatitis 01/07/2023   Dyslipidemia    Hypertension    Obesity    Osteoarthritis    Pre-diabetes    a. prior A1C 5.6, states he was on meds for this at one point.   Skin lesions 01/06/2023    Patient Active Problem List   Diagnosis Date Noted   S/P aortic valve replacement with bioprosthetic valve 05/25/2023   Complete heart block (HCC) 05/22/2023   Borderline type 2 diabetes mellitus 05/22/2023   Pleural effusion 05/19/2023   Acute on chronic systolic CHF (congestive heart failure) (HCC) 05/15/2023   Dermatitis 01/07/2023   Skin lesions 01/06/2023   Infection of finger 10/29/2022   Nontraumatic complete tear of left rotator cuff 07/21/2022   Biceps tendinitis of right upper extremity    Rotator cuff syndrome of right shoulder 10/16/2021   Other insomnia 04/30/2021   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   Statin intolerance 12/10/2018   Status post total  replacement of left hip 11/15/2018   Chronic left shoulder pain 06/08/2018   Status post total hip replacement, right 04/26/2018   Mild dilation of ascending aorta (HCC) 12/26/2017   Dyslipidemia 12/26/2017   Bilateral carotid bruits 12/26/2017   Nocturnal hypoxemia 06/29/2017   Primary osteoarthritis of both hips 06/29/2017   Chronic diastolic heart failure (HCC)    Coronary artery disease involving native coronary artery of native heart without angina pectoris 03/31/2017   Ischemic cardiomyopathy 03/31/2017   Marijuana use 03/31/2017   Prediabetes 03/31/2017   Aortic valve insufficiency 12/28/2016   Mitral valve regurgitation 12/28/2016   Obesity 12/28/2016   Aortic valve stenosis, nonrheumatic    Essential hypertension     Past Surgical History:  Procedure Laterality Date   AORTIC VALVE REPLACEMENT N/A 05/25/2023   Procedure: AORTIC VALVE REPLACEMENT (AVR) USING INSPIRIS AORTIC VALVE SIZE 27 MM;  Surgeon: Lucas Dorise POUR, MD;  Location: [facility] OR;  Service: Open Heart Surgery;  Laterality: N/A;   CORONARY STENT INTERVENTION N/A 12/29/2016   Procedure: CORONARY STENT INTERVENTION;  Surgeon: Dann Candyce RAMAN, MD;  Location: [facility] INVASIVE CV LAB;  Service: Cardiovascular;  Laterality: N/A;   IR THORACENTESIS ASP PLEURAL SPACE W/IMG GUIDE  05/19/2023   RIGHT HEART CATH AND CORONARY ANGIOGRAPHY N/A 05/18/2023   Procedure: RIGHT HEART CATH AND CORONARY ANGIOGRAPHY;  Surgeon: Mady Bruckner, MD;  Location: [facility] INVASIVE CV  LAB;  Service: Cardiovascular;  Laterality: N/A;   RIGHT/LEFT HEART CATH AND CORONARY ANGIOGRAPHY N/A 12/29/2016   Procedure: RIGHT/LEFT HEART CATH AND CORONARY ANGIOGRAPHY;  Surgeon: Dann Candyce RAMAN, MD;  [facility]: [facility] INVASIVE CV LAB;  Service: Cardiovascular;  Laterality: N/A;   SHOULDER ARTHROSCOPY WITH ROTATOR CUFF REPAIR AND OPEN BICEPS TENODESIS Right 11/28/2021   Procedure: RIGHT SHOULDER ARTHROSCOPY WITH ROTATOR CUFF REPAIR AND OPEN BICEPS TENODESIS;  Surgeon:  Genelle Standing, MD;  Location: [facility] OR;  Service: Orthopedics;  Laterality: Right;   SHOULDER ARTHROSCOPY WITH ROTATOR CUFF REPAIR AND OPEN BICEPS TENODESIS Left 07/21/2022   Procedure: LEFT SHOULDER ARTHROSCOPY WITH ROTATOR CUFF REPAIR AND BICEPS TENODESIS;  Surgeon: Genelle Standing, MD;  Location: [facility] OR;  Service: Orthopedics;  Laterality: Left;   TEE WITHOUT CARDIOVERSION N/A 05/25/2023   Procedure: TRANSESOPHAGEAL ECHOCARDIOGRAM (TEE);  Surgeon: Lucas Dorise POUR, MD;  Location: [facility] OR;  Service: Open Heart Surgery;  Laterality: N/A;   TONSILLECTOMY     TOTAL HIP ARTHROPLASTY Right 04/26/2018   Procedure: RIGHT TOTAL HIP ARTHROPLASTY ANTERIOR APPROACH;  Surgeon: Jerri Kay HERO, MD;  Location: [facility] OR;  Service: Orthopedics;  Laterality: Right;   TOTAL HIP ARTHROPLASTY Left 11/15/2018   TOTAL HIP ARTHROPLASTY Left 11/15/2018   Procedure: LEFT TOTAL HIP ARTHROPLASTY ANTERIOR APPROACH;  Surgeon: Jerri Kay HERO, MD;  Location: [facility] OR;  Service: Orthopedics;  Laterality: Left;       Home Medications    Prior to Admission medications   Medication Sig Start Date End Date Taking? Authorizing Provider  sulfamethoxazole -trimethoprim  (BACTRIM  DS) 800-160 MG tablet Take 1 tablet by mouth 2 (two) times daily for 7 days. 10/22/23 10/29/23 Yes Mikalyn Hermida A, FNP  acetaminophen  (TYLENOL ) 325 MG tablet Take 2 tablets (650 mg total) by mouth every 6 (six) hours as needed. 05/29/23   Roddenberry, Myron G, PA-C  amiodarone  (PACERONE ) 200 MG tablet Take 1 tablet (200 mg total) by mouth 2 (two) times daily. For 7 days then decrease the dose to 1 tablet (200mg ) once daily. 06/09/23   Gold, Wayne E, PA-C  amoxicillin  (AMOXIL ) 500 MG tablet Take 4 tablets (2,000 mg total) by mouth once as needed for up to 1 dose (Take one hour prior to dental procedure). 07/01/23   Roddenberry, Myron G, PA-C  aspirin  EC 325 MG tablet Take 1 tablet (325 mg total) by mouth daily. 06/09/23   Gold, Wayne E, PA-C  carvedilol  (COREG ) 3.125 MG tablet Take  1 tablet (3.125 mg total) by mouth 2 (two) times daily. 06/09/23   Gold, Wayne E, PA-C  Evolocumab  (REPATHA  SURECLICK) 140 MG/ML SOAJ INJECT 1 ML INTO THE SKIN EVERY 14 (FOURTEEN) DAYS. 10/02/23   Croitoru, Mihai, MD  fluticasone  (FLONASE ) 50 MCG/ACT nasal spray PLACE 1 SPRAY INTO BOTH NOSTRILS DAILY AS NEEDED FOR ALLERGIES OR RHINITIS. 09/01/22   Vicci Barnie NOVAK, MD  furosemide  (LASIX ) 40 MG tablet Take 1 tablet (40 mg total) by mouth daily for 10 days. 06/09/23 06/19/23  Gold, Wayne E, PA-C  loratadine (CLARITIN) 10 MG tablet Take 10 mg by mouth daily as needed for allergies.    [provider]  methocarbamol  (ROBAXIN ) 500 MG tablet Take 1 tablet (500 mg total) by mouth 2 (two) times daily as needed for muscle spasms. 10/01/21   Jule Ronal CROME, PA-C  potassium chloride  SA (KLOR-CON  M) 20 MEQ tablet Take 1 tablet (20 mEq total) by mouth daily for 10 days. 06/09/23 06/19/23  Viviane Lemond BRAVO, PA-C    Family History Family History  Problem Relation Age of Onset   CAD Father        MI/CABG age 6    Social History Social History   Tobacco Use   Smoking status: Former    Current packs/day: 0.00    Types: Cigarettes    Quit date: 08/18/2000    Years since quitting: 23.2    Passive exposure: Past   Smokeless tobacco: Never   Tobacco comments:    Smoked lightly for approx 20 years, quit 1990s  Vaping Use   Vaping status: Never Used  Substance Use Topics   Alcohol use: Yes    Comment: occasionally   Drug use: Yes    Types: Marijuana    Comment: every night (to sleep)     Allergies   Statins   Review of Systems Review of Systems  See HPI Physical Exam Triage Vital Signs ED Triage Vitals [10/22/23 1747]  Encounter Vitals Group     BP 114/74     Girls Systolic BP Percentile      Girls Diastolic BP Percentile      Boys Systolic BP Percentile      Boys Diastolic BP Percentile      Pulse Rate (!) 57     Resp 20     Temp 98.4 F (36.9 C)     Temp Source Oral     SpO2 96  %     Weight      Height      Head Circumference      Peak Flow      Pain Score 1     Pain Loc      Pain Education      Exclude from Growth Chart    No data found.  Updated Vital Signs BP 114/74 (BP Location: Right Arm)   Pulse (!) 57   Temp 98.4 F (36.9 C) (Oral)   Resp 20   SpO2 96%   Visual Acuity Right Eye Distance:   Left Eye Distance:   Bilateral Distance:    Right Eye Near:   Left Eye Near:    Bilateral Near:     Physical Exam Constitutional:      Appearance: Normal appearance.  Pulmonary:     Effort: Pulmonary effort is normal.  Musculoskeletal:        General: Normal range of motion.  Skin:    Comments: See pictures for detail.   Neurological:     Mental Status: He is alert.  Psychiatric:        Mood and Affect: Mood normal.         UC Treatments / Results  Labs (all labs ordered are listed, but only abnormal results are displayed) Labs Reviewed - No data to display  EKG   Radiology No results found.  Procedures Procedures (including critical care time)  Medications Ordered in UC Medications - No data to display  Initial Impression / Assessment and Plan / UC Course  I have reviewed the triage vital signs and the nursing notes.  Pertinent labs & imaging results that were available during my care of the patient were reviewed by me and considered in my medical decision making (see chart for details).     Skin lesions- most concerning is wound on leg that appears to be infected. We will treat with antibiotics at this time. Not sure of cause whether this is psychological or physical cause. Will have him set up appointment with PCP for referral to wound and possibly psych.  No immediate concerns at this time.  Final Clinical Impressions(s) / UC Diagnoses   Final diagnoses:  Wound of right lower extremity, initial encounter  Skin lesion     Discharge Instructions      I am treating you for infection on your leg.  Take the  antibiotics as prescribed.  Recommend establishing locally with a PCP for possible referral to wound clinic and other referrals if needed.     ED Prescriptions     Medication Sig Dispense Auth. Provider   sulfamethoxazole -trimethoprim  (BACTRIM  DS) 800-160 MG tablet Take 1 tablet by mouth 2 (two) times daily for 7 days. 14 tablet Adah Wilbert LABOR, FNP      PDMP not reviewed this encounter.   Adah Wilbert LABOR, FNP 10/26/23 [phone]

## 2023-10-22 NOTE — ED Triage Notes (Signed)
 Patient has hx of thinking he has bugs inside his body. Per sister, patient starts picking at random things and then creates large sores. States behavior has been going on since last fall. Large sore to right lower leg, left side of forehead. Sister is a retired Engineer, civil (consulting) and concerned this is a Print production planner issue.

## 2023-10-28 ENCOUNTER — Encounter (HOSPITAL_BASED_OUTPATIENT_CLINIC_OR_DEPARTMENT_OTHER): Payer: Self-pay | Admitting: Student

## 2023-10-28 ENCOUNTER — Ambulatory Visit (INDEPENDENT_AMBULATORY_CARE_PROVIDER_SITE_OTHER): Admitting: Student

## 2023-10-28 VITALS — BP 118/78 | HR 53 | Temp 98.4°F | Resp 16 | Ht 70.47 in | Wt 204.0 lb

## 2023-10-28 DIAGNOSIS — J302 Other seasonal allergic rhinitis: Secondary | ICD-10-CM | POA: Diagnosis not present

## 2023-10-28 DIAGNOSIS — Z1211 Encounter for screening for malignant neoplasm of colon: Secondary | ICD-10-CM | POA: Diagnosis not present

## 2023-10-28 DIAGNOSIS — Z136 Encounter for screening for cardiovascular disorders: Secondary | ICD-10-CM | POA: Diagnosis not present

## 2023-10-28 DIAGNOSIS — I10 Essential (primary) hypertension: Secondary | ICD-10-CM

## 2023-10-28 DIAGNOSIS — Z131 Encounter for screening for diabetes mellitus: Secondary | ICD-10-CM | POA: Diagnosis not present

## 2023-10-28 DIAGNOSIS — I2510 Atherosclerotic heart disease of native coronary artery without angina pectoris: Secondary | ICD-10-CM | POA: Diagnosis not present

## 2023-10-28 DIAGNOSIS — L989 Disorder of the skin and subcutaneous tissue, unspecified: Secondary | ICD-10-CM

## 2023-10-28 DIAGNOSIS — Z7689 Persons encountering health services in other specified circumstances: Secondary | ICD-10-CM

## 2023-10-28 DIAGNOSIS — Z13220 Encounter for screening for lipoid disorders: Secondary | ICD-10-CM

## 2023-10-28 MED ORDER — LISINOPRIL 10 MG PO TABS: 10.0000 mg | ORAL_TABLET | Freq: Every day | ORAL | 2 refills | Status: DC

## 2023-10-28 MED ORDER — FLUTICASONE PROPIONATE 50 MCG/ACT NA SUSP
1.0000 | Freq: Every day | NASAL | 3 refills | Status: DC | PRN
Start: 2023-10-28 — End: 2024-01-25

## 2023-10-28 MED ORDER — CARVEDILOL 3.125 MG PO TABS: 3.1250 mg | ORAL_TABLET | Freq: Two times a day (BID) | ORAL | 0 refills | Status: DC

## 2023-10-28 NOTE — Assessment & Plan Note (Signed)
 Managed with Flonase . Claritin recommended for systemic relief. Combination therapy expected to provide comprehensive symptom control. - Continue Flonase . - Start Claritin for systemic relief.

## 2023-10-28 NOTE — Assessment & Plan Note (Signed)
 Chronic, stable. Hypertension well-controlled with Coreg  and lisinopril . Blood pressure is 118/78 mmHg. - Continue Coreg  and lisinopril . - Provide 90-day supply of medications.

## 2023-10-28 NOTE — Assessment & Plan Note (Signed)
 Chronic wounds on the forehead, arm, and leg, previously treated as scabies with permethrin  at least 5 times per chart review. Chemical burns from hydrogen peroxide use. Suspected peripheral vascular disease contributing to poor healing in the lower extremities. Likely exacerbated by scratching, as unreachable areas are unaffected. - Refer to dermatology for evaluation and confirmation of diagnosis. - Advise against hydrogen peroxide and alcohol on wounds. - Instruct to clean wounds with soap and water and apply Vaseline. - Monitor leg wound for infection signs: redness, swelling, pus drainage. - Return for evaluation if wounds worsen or do not heal.

## 2023-10-28 NOTE — Patient Instructions (Signed)
 It was nice to see you today!  As we discussed in clinic:  Please call the dermatologist to schedule an appointment.  If you have any problems before your next visit feel free to message me via MyChart (minor issues or questions) or call the office, otherwise you may reach out to schedule an office visit.  Thank you! Leyana Whidden, PA-C

## 2023-10-28 NOTE — Progress Notes (Signed)
 New Patient Office Visit  Subjective    Patient ID: Joshua [name], male    DOB: 11/07/1960  Age: 63 y.o. MRN: [id number]  CC:  Chief Complaint  Patient presents with   Establish Care    Here to establish care. Due for cologuard. Refill needed for flonase .    Wound Check    Has had several labs on head wounds. Not sure what it is. He has had several visits for it. Came to UC last week for a wound on left leg. Was told he needed to go to a Wound Center.    Discussed the use of AI scribe software for clinical note transcription with the patient, who gave verbal consent to proceed.  History of Present Illness   Joshua [name] is a 63 year old male who presents to establish care and for evaluation of wounds.  He has wounds on his forehead, arm, and leg, which he has been treating with hydrogen peroxide. The wounds are recurring, and he has been informed they might be scabies, though his is doubtful base don chart review. He suspects a connection to insect issues at home. Patient does have a history of thinking there are bugs crawling on his skin.  He has a history of hypertension, managed with Coreg  and lisinopril  10 mg twice daily, with well-controlled blood pressure at 118/78 mmHg. He also uses Flonase  for seasonal allergies.  He has a family history of colon cancer in his maternal grandfather and a cousin. He has undergone two colonoscopies in the past, with no polyps found.  He has a history of smoking, having quit in 1990 but restarted in 2000 for about three years. He currently smokes about a quarter pack a day, primarily for anxiety relief. He has a history of substance abuse but has been off opiates for many years. He experiences anxiety, which he manages by occasionally smoking marijuana. He has seen a psychiatrist in the past for substance abuse issues but not recently for anxiety.  He has undergone multiple surgeries in the past eight years, including both shoulders and  hips replaced. He reports leg cramps, for which he uses an over-the-counter remedy that seems to help. He has been experiencing wounds on his legs. He has not had a workup for peripheral arterial disease.      Screenings:  Colon Cancer: Indicated Lung Cancer: History of smoking  Diabetes: Indicated HLD: Indicated   Outpatient Encounter Medications as of 10/28/2023  Medication Sig   acetaminophen  (TYLENOL ) 325 MG tablet Take 2 tablets (650 mg total) by mouth every 6 (six) hours as needed.   amoxicillin  (AMOXIL ) 500 MG tablet Take 4 tablets (2,000 mg total) by mouth once as needed for up to 1 dose (Take one hour prior to dental procedure).   aspirin  EC 81 MG tablet Take 81 mg by mouth daily. Swallow whole.   Evolocumab  (REPATHA  SURECLICK) 140 MG/ML SOAJ INJECT 1 ML INTO THE SKIN EVERY 14 (FOURTEEN) DAYS.   lisinopril  (ZESTRIL ) 10 MG tablet Take 1 tablet (10 mg total) by mouth daily.   loratadine (CLARITIN) 10 MG tablet Take 10 mg by mouth daily as needed for allergies.   methocarbamol  (ROBAXIN ) 500 MG tablet Take 1 tablet (500 mg total) by mouth 2 (two) times daily as needed for muscle spasms.   potassium chloride  SA (KLOR-CON  M) 20 MEQ tablet Take 1 tablet (20 mEq total) by mouth daily for 10 days.   sulfamethoxazole -trimethoprim  (BACTRIM  DS) 800-160 MG tablet Take 1  tablet by mouth 2 (two) times daily for 7 days.   [DISCONTINUED] aspirin  EC 325 MG tablet Take 1 tablet (325 mg total) by mouth daily.   [DISCONTINUED] carvedilol  (COREG ) 3.125 MG tablet Take 1 tablet (3.125 mg total) by mouth 2 (two) times daily.   [DISCONTINUED] fluticasone  (FLONASE ) 50 MCG/ACT nasal spray PLACE 1 SPRAY INTO BOTH NOSTRILS DAILY AS NEEDED FOR ALLERGIES OR RHINITIS.   amiodarone  (PACERONE ) 200 MG tablet Take 1 tablet (200 mg total) by mouth 2 (two) times daily. For 7 days then decrease the dose to 1 tablet (200mg ) once daily. (Patient not taking: Reported on 10/28/2023)   carvedilol  (COREG ) 3.125 MG tablet Take 1  tablet (3.125 mg total) by mouth 2 (two) times daily.   fluticasone  (FLONASE ) 50 MCG/ACT nasal spray Place 1 spray into both nostrils daily as needed for allergies or rhinitis.   [DISCONTINUED] furosemide  (LASIX ) 40 MG tablet Take 1 tablet (40 mg total) by mouth daily for 10 days. (Patient not taking: Reported on 10/28/2023)   No facility-administered encounter medications on file as of 10/28/2023.    Past Medical History:  Diagnosis Date   Aortic stenosis    CAD (coronary artery disease), native coronary artery 12/26/2016   DES LAD & RCA, EF 25-30%   CHF (congestive heart failure) (HCC)    COVID-19 vaccine regimen to maintain immunity completed 04/27/2020   Dermatitis 01/07/2023   Dyslipidemia    Hyperlipidemia    Hypertension    Obesity    Osteoarthritis    Pre-diabetes    a. prior A1C 5.6, states he was on meds for this at one point.   Skin lesions 01/06/2023    Past Surgical History:  Procedure Laterality Date   AORTIC VALVE REPLACEMENT N/A 05/25/2023   Procedure: AORTIC VALVE REPLACEMENT (AVR) USING INSPIRIS AORTIC VALVE SIZE 27 MM;  Surgeon: Lucas Dorise POUR, MD;  Location: [facility] OR;  Service: Open Heart Surgery;  Laterality: N/A;   CORONARY STENT INTERVENTION N/A 12/29/2016   Procedure: CORONARY STENT INTERVENTION;  Surgeon: Dann Candyce RAMAN, MD;  [facility] INVASIVE CV LAB;  Service: Cardiovascular;  Laterality: N/A;   IR THORACENTESIS ASP PLEURAL SPACE W/IMG GUIDE  05/19/2023   RIGHT HEART CATH AND CORONARY ANGIOGRAPHY N/A 05/18/2023   Procedure: RIGHT HEART CATH AND CORONARY ANGIOGRAPHY;  Surgeon: Mady Bruckner, MD;  Location: [facility] INVASIVE CV LAB;  Service: Cardiovascular;  Laterality: N/A;   RIGHT/LEFT HEART CATH AND CORONARY ANGIOGRAPHY N/A 12/29/2016   Procedure: RIGHT/LEFT HEART CATH AND CORONARY ANGIOGRAPHY;  Surgeon: Dann Candyce RAMAN, MD;  Location: [facility] INVASIVE CV LAB;  Service: Cardiovascular;  Laterality: N/A;   SHOULDER ARTHROSCOPY WITH ROTATOR CUFF REPAIR AND  OPEN BICEPS TENODESIS Right 11/28/2021   Procedure: RIGHT SHOULDER ARTHROSCOPY WITH ROTATOR CUFF REPAIR AND OPEN BICEPS TENODESIS;  Surgeon: Genelle Standing, MD;  Location: [facility] OR;  Service: Orthopedics;  Laterality: Right;   SHOULDER ARTHROSCOPY WITH ROTATOR CUFF REPAIR AND OPEN BICEPS TENODESIS Left 07/21/2022   Procedure: LEFT SHOULDER ARTHROSCOPY WITH ROTATOR CUFF REPAIR AND BICEPS TENODESIS;  Surgeon: Genelle Standing, MD;  Location: [facility] OR;  Service: Orthopedics;  Laterality: Left;   TEE WITHOUT CARDIOVERSION N/A 05/25/2023   Procedure: TRANSESOPHAGEAL ECHOCARDIOGRAM (TEE);  Surgeon: Lucas Dorise POUR, MD;  Location: [facility] OR;  Service: Open Heart Surgery;  Laterality: N/A;   TONSILLECTOMY     TOTAL HIP ARTHROPLASTY Right 04/26/2018   Procedure: RIGHT TOTAL HIP ARTHROPLASTY ANTERIOR APPROACH;  Surgeon: Jerri Kay HERO, MD;  Location: [facility] OR;  Service: Orthopedics;  Laterality: Right;   TOTAL HIP ARTHROPLASTY Left 11/15/2018   TOTAL HIP ARTHROPLASTY Left 11/15/2018   Procedure: LEFT TOTAL HIP ARTHROPLASTY ANTERIOR APPROACH;  Surgeon: Jerri Kay HERO, MD;  Location: [facility] OR;  Service: Orthopedics;  Laterality: Left;    Family History  Problem Relation Age of Onset   Dementia Mother    Hypertension Mother    CAD Father        MI/CABG age 32   Hypertension Father    Hyperlipidemia Father    Skin cancer Father        basal cell   Prostate cancer Father    Thyroid disease Sister    Hyperlipidemia Sister    Colon cancer Maternal Grandfather     Social History   Socioeconomic History   Marital status: Single    Spouse name: Not on file   Number of children: 0   Years of education: Not on file   Highest education level: Not on file  Occupational History   Occupation: Retired-Upholstery  Tobacco Use   Smoking status: Former    Current packs/day: 0.00    Types: Cigarettes    Quit date: 08/18/2000    Years since quitting: 23.2    Passive exposure: Past   Smokeless tobacco: Never   Tobacco comments:     Smoked lightly for approx 20 years, quit 1990s  Vaping Use   Vaping status: Never Used  Substance and Sexual Activity   Alcohol use: Yes    Comment: occasionally   Drug use: Yes    Types: Marijuana    Comment: every night (to sleep)   Sexual activity: Not on file  Other Topics Concern   Not on file  Social History Narrative   Lives Home alone. Sister makes all health related decisions. Declined Advanced directive at this time   Social Drivers of Corporate investment banker Strain: Not on file  Food Insecurity: No Food Insecurity (10/28/2023)   Hunger Vital Sign    Worried About Running Out of Food in the Last Year: Never true    Ran Out of Food in the Last Year: Never true  Transportation Needs: No Transportation Needs (10/28/2023)   PRAPARE - Administrator, Civil Service (Medical): No    Lack of Transportation (Non-Medical): No  Physical Activity: Not on file  Stress: Not on file  Social Connections: Not on file  Intimate Partner Violence: Not At Risk (10/28/2023)   Humiliation, Afraid, Rape, and Kick questionnaire    Fear of Current or Ex-Partner: No    Emotionally Abused: No    Physically Abused: No    Sexually Abused: No    ROS  Per HPI      Objective    BP 118/78   Pulse (!) 53   Temp 98.4 F (36.9 C) (Oral)   Resp 16   Ht 5' 10.47 (1.79 m)   Wt 204 lb (92.5 kg)   SpO2 98%   BMI 28.88 kg/m   Physical Exam Constitutional:      General: He is not in acute distress.    Appearance: Normal appearance. He is not ill-appearing.  HENT:     Head: Normocephalic and atraumatic.     Right Ear: External ear normal.     Left Ear: External ear normal.     Nose: Nose normal.     Mouth/Throat:     Mouth: Mucous membranes are moist.     Pharynx: Oropharynx is clear. No oropharyngeal exudate  or posterior oropharyngeal erythema.  Eyes:     General: No scleral icterus.    Conjunctiva/sclera: Conjunctivae normal.  Cardiovascular:     Rate and  Rhythm: Normal rate and regular rhythm.     Pulses: Normal pulses.     Heart sounds: Normal heart sounds. No murmur heard.    No friction rub.  Pulmonary:     Effort: Pulmonary effort is normal. No respiratory distress.     Breath sounds: Normal breath sounds. No wheezing, rhonchi or rales.  Skin:    General: Skin is warm and dry.     Coloration: Skin is not jaundiced or pale.     Comments: Multiple lesions of various healing stages across UE and LE posterior back unaffected. Shiny and pigmented LE skin bilateral.  Neurological:     Mental Status: He is alert.  Psychiatric:        Mood and Affect: Mood normal.        Behavior: Behavior normal.         Assessment & Plan:   Encounter to establish care  Essential hypertension Assessment & Plan: Chronic, stable. Hypertension well-controlled with Coreg  and lisinopril . Blood pressure is 118/78 mmHg. - Continue Coreg  and lisinopril . - Provide 90-day supply of medications.  Orders: -     Carvedilol ; Take 1 tablet (3.125 mg total) by mouth 2 (two) times daily.  Dispense: 180 tablet; Refill: 0 -     Lisinopril ; Take 1 tablet (10 mg total) by mouth daily.  Dispense: 90 tablet; Refill: 2 -     CBC with Differential/Platelet -     Comprehensive metabolic panel with GFR  Coronary artery disease involving native coronary artery of native heart without angina pectoris -     Carvedilol ; Take 1 tablet (3.125 mg total) by mouth 2 (two) times daily.  Dispense: 180 tablet; Refill: 0 -     CBC with Differential/Platelet -     Comprehensive metabolic panel with GFR  Skin lesions Assessment & Plan: Chronic wounds on the forehead, arm, and leg, previously treated as scabies with permethrin  at least 5 times per chart review. Chemical burns from hydrogen peroxide use. Suspected peripheral vascular disease contributing to poor healing in the lower extremities. Likely exacerbated by scratching, as unreachable areas are unaffected. - Refer to  dermatology for evaluation and confirmation of diagnosis. - Advise against hydrogen peroxide and alcohol on wounds. - Instruct to clean wounds with soap and water and apply Vaseline. - Monitor leg wound for infection signs: redness, swelling, pus drainage. - Return for evaluation if wounds worsen or do not heal.   Seasonal allergies Assessment & Plan: Managed with Flonase . Claritin recommended for systemic relief. Combination therapy expected to provide comprehensive symptom control. - Continue Flonase . - Start Claritin for systemic relief.  Orders: -     Fluticasone  Propionate; Place 1 spray into both nostrils daily as needed for allergies or rhinitis.  Dispense: 16 g; Refill: 3  Screen for colon cancer -     Ambulatory referral to Gastroenterology  Encounter for lipid screening for cardiovascular disease -     Lipid panel  Screening for diabetes mellitus -     Hemoglobin A1c  Return in about 3 months (around 01/28/2024) for Chronic Followup/skin lesions.   Talyia Allende T Francessca Friis, PA-C

## 2023-10-29 ENCOUNTER — Telehealth (HOSPITAL_BASED_OUTPATIENT_CLINIC_OR_DEPARTMENT_OTHER): Payer: Self-pay

## 2023-10-29 ENCOUNTER — Ambulatory Visit (HOSPITAL_BASED_OUTPATIENT_CLINIC_OR_DEPARTMENT_OTHER): Payer: Self-pay | Admitting: Student

## 2023-10-29 LAB — CBC WITH DIFFERENTIAL/PLATELET
Basophils Absolute: 0 x10E3/uL (ref 0.0–0.2)
Basos: 1 %
EOS (ABSOLUTE): 0.2 x10E3/uL (ref 0.0–0.4)
Eos: 3 %
Hematocrit: 52.4 % — ABNORMAL HIGH (ref 37.5–51.0)
Hemoglobin: 16.5 g/dL (ref 13.0–17.7)
Immature Grans (Abs): 0 x10E3/uL (ref 0.0–0.1)
Immature Granulocytes: 0 %
Lymphocytes Absolute: 1.7 x10E3/uL (ref 0.7–3.1)
Lymphs: 29 %
MCH: 25.4 pg — ABNORMAL LOW (ref 26.6–33.0)
MCHC: 31.5 g/dL (ref 31.5–35.7)
MCV: 81 fL (ref 79–97)
Monocytes Absolute: 0.6 x10E3/uL (ref 0.1–0.9)
Monocytes: 11 %
Neutrophils Absolute: 3.2 x10E3/uL (ref 1.4–7.0)
Neutrophils: 56 %
Platelets: 176 x10E3/uL (ref 150–450)
RBC: 6.49 x10E6/uL — ABNORMAL HIGH (ref 4.14–5.80)
RDW: 20.5 % — ABNORMAL HIGH (ref 11.6–15.4)
WBC: 5.7 x10E3/uL (ref 3.4–10.8)

## 2023-10-29 LAB — LIPID PANEL
Chol/HDL Ratio: 2.1 ratio (ref 0.0–5.0)
Cholesterol, Total: 95 mg/dL — ABNORMAL LOW (ref 100–199)
HDL: 46 mg/dL (ref >39)
LDL Chol Calc (NIH): 27 mg/dL (ref 0–99)
Triglycerides: 123 mg/dL (ref 0–149)
VLDL Cholesterol Cal: 22 mg/dL (ref 5–40)

## 2023-10-29 LAB — HEMOGLOBIN A1C
Est. average glucose Bld gHb Est-mCnc: 117 mg/dL
Hgb A1c MFr Bld: 5.7 % — ABNORMAL HIGH (ref 4.8–5.6)

## 2023-10-29 LAB — COMPREHENSIVE METABOLIC PANEL WITH GFR
ALT: 22 IU/L (ref 0–44)
AST: 15 IU/L (ref 0–40)
Albumin: 4.6 g/dL (ref 3.9–4.9)
Alkaline Phosphatase: 160 IU/L — ABNORMAL HIGH (ref 44–121)
BUN/Creatinine Ratio: 13 (ref 10–24)
BUN: 16 mg/dL (ref 8–27)
Bilirubin Total: 0.4 mg/dL (ref 0.0–1.2)
CO2: 23 mmol/L (ref 20–29)
Calcium: 9.4 mg/dL (ref 8.6–10.2)
Chloride: 103 mmol/L (ref 96–106)
Creatinine, Ser: 1.27 mg/dL (ref 0.76–1.27)
Globulin, Total: 2.7 g/dL (ref 1.5–4.5)
Glucose: 69 mg/dL — ABNORMAL LOW (ref 70–99)
Potassium: 5.2 mmol/L (ref 3.5–5.2)
Sodium: 141 mmol/L (ref 134–144)
Total Protein: 7.3 g/dL (ref 6.0–8.5)
eGFR: 63 mL/min/1.73 (ref >59)

## 2023-10-29 NOTE — Telephone Encounter (Signed)
 Left VM for Ann, sister, per HIPAA letting her know water bottle will be at the front desk w

## 2023-10-29 NOTE — Telephone Encounter (Signed)
 W/ lost and found. Name was placed on water bottle.

## 2023-12-15 ENCOUNTER — Other Ambulatory Visit: Payer: Self-pay | Admitting: Internal Medicine

## 2023-12-15 DIAGNOSIS — I10 Essential (primary) hypertension: Secondary | ICD-10-CM

## 2023-12-15 NOTE — Telephone Encounter (Signed)
 Unable to refill per protocol, Rx expired. Discontinued 05/29/23.  Requested Prescriptions  Pending Prescriptions Disp Refills   potassium chloride  (KLOR-CON ) 10 MEQ tablet [Pharmacy Med Name: POTASSIUM CL ER 10 MEQ TAB WAX] 90 tablet 1    Sig: TAKE 1 TABLET (10 MEQ TOTAL) BY MOUTH DAILY. PLEASE MAKE PCP APPOINTMENT.     Endocrinology:  Minerals - Potassium Supplementation Passed - 12/15/2023  3:41 PM      Passed - K in normal range and within 360 days    Potassium  Date Value Ref Range Status  10/28/2023 5.2 3.5 - 5.2 mmol/L Final         Passed - Cr in normal range and within 360 days    Creatinine, Ser  Date Value Ref Range Status  10/28/2023 1.27 0.76 - 1.27 mg/dL Final         Passed - Valid encounter within last 12 months    Recent Outpatient Visits           11 months ago Dermatitis of lower extremity   [facility] Of [place]. [facility] Barnie NOVAK, MD   1 year ago Dermatitis of lower extremity   [facility] - [facility] Dept Of [place]. [facility] Barnie B, MD   1 year ago Abscess around nail of left index finger   [facility] - A Dept Of [place]. [facility] Barnie NOVAK, MD   1 year ago Essential hypertension   [facility] Of [place]. [facility] Barnie NOVAK, MD   2 years ago Essential hypertension   [facility] [place] - [facility] Dept Of . [facility] Barnie NOVAK, MD

## 2024-01-14 ENCOUNTER — Encounter: Payer: Self-pay | Admitting: Student

## 2024-01-23 ENCOUNTER — Other Ambulatory Visit (HOSPITAL_BASED_OUTPATIENT_CLINIC_OR_DEPARTMENT_OTHER): Payer: Self-pay | Admitting: Student

## 2024-01-23 DIAGNOSIS — I2510 Atherosclerotic heart disease of native coronary artery without angina pectoris: Secondary | ICD-10-CM

## 2024-01-23 DIAGNOSIS — J302 Other seasonal allergic rhinitis: Secondary | ICD-10-CM

## 2024-01-23 DIAGNOSIS — I10 Essential (primary) hypertension: Secondary | ICD-10-CM

## 2024-01-28 ENCOUNTER — Encounter (HOSPITAL_BASED_OUTPATIENT_CLINIC_OR_DEPARTMENT_OTHER): Payer: Self-pay | Admitting: Student

## 2024-01-28 ENCOUNTER — Ambulatory Visit (INDEPENDENT_AMBULATORY_CARE_PROVIDER_SITE_OTHER): Admitting: Student

## 2024-01-28 VITALS — BP 152/90 | HR 63 | Temp 97.9°F | Resp 16 | Ht 70.47 in | Wt 225.6 lb

## 2024-01-28 DIAGNOSIS — L989 Disorder of the skin and subcutaneous tissue, unspecified: Secondary | ICD-10-CM | POA: Diagnosis not present

## 2024-01-28 DIAGNOSIS — I10 Essential (primary) hypertension: Secondary | ICD-10-CM

## 2024-01-28 DIAGNOSIS — M62838 Other muscle spasm: Secondary | ICD-10-CM | POA: Diagnosis not present

## 2024-01-28 MED ORDER — LISINOPRIL 20 MG PO TABS: 20.0000 mg | ORAL_TABLET | Freq: Every day | ORAL | 1 refills | Status: AC

## 2024-01-28 MED ORDER — METHOCARBAMOL 500 MG PO TABS: 500.0000 mg | ORAL_TABLET | Freq: Two times a day (BID) | ORAL | 2 refills | Status: AC | PRN

## 2024-01-28 NOTE — Progress Notes (Signed)
 Established Patient Office Visit  Subjective   Patient ID: Joshua [name], male    DOB: 31-Jul-1960  Age: 63 y.o. MRN: [id number]  Chief Complaint  Patient presents with   Medical Management of Chronic Issues    Follow up. BP has been high the last few days.    Wound Check    He has a lesion behind right ear. Also has some wounds on buttocks and left side. Is convinced someone dropped cat litter around house and thinks it is causing wounds.     HPI  Discussed the use of AI scribe software for clinical note transcription with the patient, who gave verbal consent to proceed.  History of Present Illness   Joshua [name] is a 63 year old male who presents with skin lesions and concerns about medication management.  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 Despite using a prescribed wash, the lesions continue to peel and new ones have appeared. He uses a wound wash and mupirocin  ointment but is concerned these treatments are not strong enough. He has a history of being misdiagnosed with scabies and has undergone multiple treatments without success. The lesions sometimes appear as 'red runners' and he has experienced infections in the past. He discusses environmental factors, such as humidity and mold in his home, which he believes may be contributing to his skin issues. He mentions having cats that go in and out of the house, suspecting they might be related to his skin problems.  He is currently taking lisinopril  for blood pressure management, with confusion regarding the dosage as one bottle indicates twice daily and another once daily. He has been taking it twice daily, which he believes was causing spikes in his blood pressure. He also takes Coreg  and aspirin  daily. His blood pressure was spiking in the evenings, leading him to adjust his medication  intake.  He uses methocarbamol  for muscle pain affecting his back, hips, arms, and knees. It helps with muscle spasms and aids in sleep. He experiences cramps in his legs, particularly when stretching in bed, and has not been able to maintain his physical activity due to these issues.      Patient Active Problem List   Diagnosis Date Noted   Seasonal allergies 10/28/2023   S/P aortic valve replacement with bioprosthetic valve 05/25/2023   Complete heart block (HCC) 05/22/2023   Borderline type 2 diabetes mellitus 05/22/2023   Pleural effusion 05/19/2023   Acute on chronic systolic CHF (congestive heart failure) (HCC) 05/15/2023   Dermatitis 01/07/2023   Skin lesions 01/06/2023   Infection of finger 10/29/2022   Nontraumatic complete tear of left rotator cuff 07/21/2022   Biceps tendinitis of right upper extremity    Rotator cuff syndrome of right shoulder 10/16/2021   Other insomnia 04/30/2021   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   Statin intolerance 12/10/2018   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/15/2018   Chronic left shoulder pain 06/08/2018   Status post total hip replacement, right 04/26/2018   Mild dilation of ascending aorta 12/26/2017   Dyslipidemia 12/26/2017   Bilateral carotid bruits 12/26/2017   Nocturnal hypoxemia 06/29/2017   Primary osteoarthritis of both hips 06/29/2017   Chronic diastolic heart failure (HCC)    Coronary artery disease involving native coronary artery of native heart without angina pectoris 03/31/2017   Ischemic  cardiomyopathy 03/31/2017   Marijuana use 03/31/2017   Prediabetes 03/31/2017   Aortic valve insufficiency 12/28/2016   Mitral valve regurgitation 12/28/2016   Obesity 12/28/2016   Aortic valve stenosis, nonrheumatic    Essential hypertension    Past Medical History:  Diagnosis Date   Aortic stenosis    CAD (coronary artery disease), native coronary artery 12/26/2016   DES LAD & RCA, EF 25-30%   CHF (congestive  heart failure) (HCC)    COVID-19 vaccine regimen to maintain immunity completed 04/27/2020   Dermatitis 01/07/2023   Dyslipidemia    Hyperlipidemia    Hypertension    Obesity    Osteoarthritis    Pre-diabetes    a. prior A1C 5.6, states he was on meds for this at one point.   Skin lesions 01/06/2023   Social History   Tobacco Use   Smoking status: Former    Current packs/day: 0.00    Average packs/day: 0.3 packs/day for 19.0 years (4.8 ttl pk-yrs)    Types: Cigarettes    Start date: 74    Quit date: 08/18/2000    Years since quitting: 23.4    Passive exposure: Past   Smokeless tobacco: Never   Tobacco comments:    Smoked lightly for approx 20 years, quit 1990s  Vaping Use   Vaping status: Never Used  Substance Use Topics   Alcohol use: Yes    Comment: occasionally   Drug use: Yes    Types: Marijuana    Comment: every night (to sleep)   Allergies  Allergen Reactions   Statins Other (See Comments)    Elevated LFTs      ROS Per HPI.    Objective:     BP (!) 152/90   Pulse 63   Temp 97.9 F (36.6 C) (Oral)   Resp 16   Ht 5' 10.47 (1.79 m)   Wt 225 lb 9.6 oz (102.3 kg)   SpO2 95%   BMI 31.94 kg/m  BP Readings from Last 3 Encounters:  01/28/24 (!) 152/90  10/28/23 118/78  10/22/23 114/74   Wt Readings from Last 3 Encounters:  01/28/24 225 lb 9.6 oz (102.3 kg)  10/28/23 204 lb (92.5 kg)  07/01/23 224 lb (101.6 kg)   Physical Exam Constitutional:      General: He is not in acute distress.    Appearance: Normal appearance. He is not ill-appearing.  HENT:     Head: Normocephalic and atraumatic.     Right Ear: External ear normal.     Left Ear: External ear normal.     Nose: Nose normal.  Eyes:     Conjunctiva/sclera: Conjunctivae normal.  Cardiovascular:     Rate and Rhythm: Normal rate and regular rhythm.     Pulses: Normal pulses.     Heart sounds: Normal heart sounds. No murmur heard.    No friction rub.  Pulmonary:     Effort: Pulmonary  effort is normal. No respiratory distress.     Breath sounds: Normal breath sounds. No wheezing, rhonchi or rales.  Skin:    General: Skin is warm and dry.     Coloration: Skin is not jaundiced or pale.     Findings: Lesion (Multiple lesions- behind R ear, and two on left buttock. No infection currently.) present.  Neurological:     Mental Status: He is alert.  Psychiatric:        Mood and Affect: Mood normal.        Behavior: Behavior normal.  No results found for any visits on 01/28/24.  Last CBC Lab Results  Component Value Date   WBC 5.7 10/28/2023   HGB 16.5 10/28/2023   HCT 52.4 (H) 10/28/2023   MCV 81 10/28/2023   MCH 25.4 (L) 10/28/2023   RDW 20.5 (H) 10/28/2023   PLT 176 10/28/2023   Last metabolic panel Lab Results  Component Value Date   GLUCOSE 69 (L) 10/28/2023   NA 141 10/28/2023   K 5.2 10/28/2023   CL 103 10/28/2023   CO2 23 10/28/2023   BUN 16 10/28/2023   CREATININE 1.27 10/28/2023   EGFR 63 10/28/2023   CALCIUM  9.4 10/28/2023   PROT 7.3 10/28/2023   ALBUMIN  4.6 10/28/2023   LABGLOB 2.7 10/28/2023   AGRATIO 2.0 12/28/2020   BILITOT 0.4 10/28/2023   ALKPHOS 160 (H) 10/28/2023   AST 15 10/28/2023   ALT 22 10/28/2023   ANIONGAP 11 06/09/2023   Last lipids Lab Results  Component Value Date   CHOL 95 (L) 10/28/2023   HDL 46 10/28/2023   LDLCALC 27 10/28/2023   TRIG 123 10/28/2023   CHOLHDL 2.1 10/28/2023   Last hemoglobin A1c Lab Results  Component Value Date   HGBA1C 5.7 (H) 10/28/2023      The ASCVD Risk score (Arnett DK, et al., 2019) failed to calculate for the following reasons:   The valid total cholesterol range is 130 to 320 mg/dL    Assessment & Plan:   Assessment and Plan    Chronic skin wounds with recurrent excoriations Chronic skin wounds with recurrent excoriations, primarily on the backside and other areas. Concerns about potential infection due to lack of consistent use of wound wash and mupirocin . Previous  dermatology consultation suggested parotid nodularis- they will be seeking second opinion by [facility]. Reports of environmental factors such as mold and cat feces potentially contributing to skin issues. - Continue using wound wash and mupirocin  for wound care. - Referred to [facility] for a second opinion. - Ensure wounds are kept clean to prevent infection.  Essential hypertension Hypertension management with lisinopril  and Coreg . Current regimen includes lisinopril  10 mg twice daily and Coreg  twice daily. Blood pressure control is suboptimal with evening spikes. Decision made to adjust lisinopril  to 20 mg once daily for better compliance and control. - Adjusted lisinopril  to 20 mg once daily. - Continue Coreg  twice daily.  Muscle spasms and pain Muscle spasms and pain affecting joints, hips, and arms. Methocarbamol  is used for muscle relaxation and pain relief, aiding in sleep. - Continue methocarbamol  for muscle spasms and pain relief.     No follow-ups on file.    Alvetta Hidrogo T Zinia Innocent, PA-C

## 2024-01-28 NOTE — Patient Instructions (Addendum)
 It was nice to see you today!  As we discussed in clinic:  Please review your medication list at home and make sure it looks correct.   [facility] Address: [address], [place], [place] [address] Phone: [phone]  If you have any problems before your next visit feel free to message me via MyChart (minor issues or questions) or call the office, otherwise you may reach out to schedule an office visit.  Thank you! Jury Caserta, PA-C

## 2024-04-19 ENCOUNTER — Encounter (HOSPITAL_BASED_OUTPATIENT_CLINIC_OR_DEPARTMENT_OTHER): Payer: Self-pay | Admitting: Emergency Medicine

## 2024-04-19 ENCOUNTER — Ambulatory Visit (HOSPITAL_BASED_OUTPATIENT_CLINIC_OR_DEPARTMENT_OTHER)
Admission: EM | Admit: 2024-04-19 | Discharge: 2024-04-19 | Disposition: A | Discharge status: Home / Self Care | Attending: Family Medicine | Admitting: Family Medicine

## 2024-04-19 DIAGNOSIS — L03115 Cellulitis of right lower limb: Secondary | ICD-10-CM | POA: Diagnosis not present

## 2024-04-19 DIAGNOSIS — L03113 Cellulitis of right upper limb: Secondary | ICD-10-CM

## 2024-04-19 DIAGNOSIS — I2510 Atherosclerotic heart disease of native coronary artery without angina pectoris: Secondary | ICD-10-CM

## 2024-04-19 DIAGNOSIS — L03116 Cellulitis of left lower limb: Secondary | ICD-10-CM

## 2024-04-19 DIAGNOSIS — I10 Essential (primary) hypertension: Secondary | ICD-10-CM

## 2024-04-19 DIAGNOSIS — R609 Edema, unspecified: Secondary | ICD-10-CM | POA: Diagnosis not present

## 2024-04-19 MED ORDER — MUPIROCIN 2 % EX OINT: 1.0000 | TOPICAL_OINTMENT | Freq: Two times a day (BID) | CUTANEOUS | 0 refills | Status: AC

## 2024-04-19 MED ORDER — CLINDAMYCIN HCL 300 MG PO CAPS: 300.0000 mg | ORAL_CAPSULE | Freq: Three times a day (TID) | ORAL | 0 refills | Status: AC

## 2024-04-19 NOTE — Discharge Instructions (Addendum)
 Cellulitis of right forearm, right lower leg and left lower leg with dependent edema: Clean the wounds with warm soapy finger, rinse, pat dry, apply thin coats of mupirocin  ointment to any open or weeping areas.  Apply gauze.  Clean the wounds at least once daily.  Clindamycin  300 mg 3 times daily with food for 10 days.  Follow-up with primary care.  Patient needs to be managed at a wound care center or to see dermatology or both.  See below for signs and symptoms of worsening condition and reasons to go to an emergency room for possible sepsis or IV antibiotics.  Get help right away if: You have a high fever (101.5 or higher) You have worsening pain in the legs or arms where you have the infection You notice red streaks coming from the infected area. You notice the skin turns purple or black and falls off. This symptom may be an emergency. Get help right away. Call 911. Do not wait to see if the symptom will go away. Do not drive yourself to the hospital.

## 2024-04-19 NOTE — ED Triage Notes (Signed)
 Pt believes he has scabies on his right arm he first noticed 3-4 days ago. Pt has had it in the past.

## 2024-04-19 NOTE — ED Provider Notes (Signed)
 " [facility]    CSN: [id number] Arrival date & time: 04/19/24  1705      History   Chief Complaint Chief Complaint  Patient presents with   Rash    HPI Joshua [name] is a 64 y.o. male.   64 year old male who reports rashes and skin eruptions on both forearms and both lower legs.  The patient reports he was exposed to scabies associated with the dog approximately a year ago or longer and another person's home.SABRA  He has had treatments for scabies but the rashes have recurred and persisted.  He reports a woman who was part of the household that own the dog with scabies, came into his house to do some house cleaning and brought scabies into his home.  That woman also did something with kitty litter around his home and now he has more mites or bugs all around his house.  He reports he has been to 1 wound care center, 2 dermatologist and at least 1 primary care.  He has had multiple treatments over the last year and the rash recurs.  He wants treatment for scabies but he is specifically thinking he needs antibiotics not permethrin  cream.   Rash Associated symptoms: no abdominal pain, no diarrhea, no fever, no joint pain, no nausea and not vomiting     Past Medical History:  Diagnosis Date   Aortic stenosis    CAD (coronary artery disease), native coronary artery 12/26/2016   DES LAD & RCA, EF 25-30%   CHF (congestive heart failure) (HCC)    COVID-19 vaccine regimen to maintain immunity completed 04/27/2020   Dermatitis 01/07/2023   Dyslipidemia    Hyperlipidemia    Hypertension    Obesity    Osteoarthritis    Pre-diabetes    a. prior A1C 5.6, states he was on meds for this at one point.   Skin lesions 01/06/2023    Patient Active Problem List   Diagnosis Date Noted   Seasonal allergies 10/28/2023   S/P aortic valve replacement with bioprosthetic valve 05/25/2023   Complete heart block (HCC) 05/22/2023   Borderline type 2 diabetes mellitus 05/22/2023   Pleural  effusion 05/19/2023   Acute on chronic systolic CHF (congestive heart failure) (HCC) 05/15/2023   Dermatitis 01/07/2023   Skin lesions 01/06/2023   Infection of finger 10/29/2022   Nontraumatic complete tear of left rotator cuff 07/21/2022   Biceps tendinitis of right upper extremity    Rotator cuff syndrome of right shoulder 10/16/2021   Other insomnia 04/30/2021   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   Statin intolerance 12/10/2018   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/15/2018   Chronic left shoulder pain 06/08/2018   Status post total hip replacement, right 04/26/2018   Mild dilation of ascending aorta 12/26/2017   Dyslipidemia 12/26/2017   Bilateral carotid bruits 12/26/2017   Nocturnal hypoxemia 06/29/2017   Primary osteoarthritis of both hips 06/29/2017   Chronic diastolic heart failure (HCC)    Coronary artery disease involving native coronary artery of native heart without angina pectoris 03/31/2017   Ischemic cardiomyopathy 03/31/2017   Marijuana use 03/31/2017   Prediabetes 03/31/2017   Aortic valve insufficiency 12/28/2016   Mitral valve regurgitation 12/28/2016   Obesity 12/28/2016   Aortic valve stenosis, nonrheumatic    Essential hypertension     Past Surgical History:  Procedure Laterality Date   AORTIC VALVE REPLACEMENT N/A 05/25/2023   Procedure: AORTIC VALVE REPLACEMENT (AVR) USING INSPIRIS AORTIC VALVE SIZE  27 MM;  Surgeon: Lucas Dorise POUR, MD;  Location: [facility] OR;  Service: Open Heart Surgery;  Laterality: N/A;   CORONARY STENT INTERVENTION N/A 12/29/2016   Procedure: CORONARY STENT INTERVENTION;  Surgeon: Dann Candyce RAMAN, MD;  Location: [name] INVASIVE CV LAB;  Service: Cardiovascular;  Laterality: N/A;   IR THORACENTESIS ASP PLEURAL SPACE W/IMG GUIDE  05/19/2023   RIGHT HEART CATH AND CORONARY ANGIOGRAPHY N/A 05/18/2023   Procedure: RIGHT HEART CATH AND CORONARY ANGIOGRAPHY;  Surgeon: Mady Bruckner, MD;  Location: [facility] INVASIVE CV LAB;  Service:  Cardiovascular;  Laterality: N/A;   RIGHT/LEFT HEART CATH AND CORONARY ANGIOGRAPHY N/A 12/29/2016   Procedure: RIGHT/LEFT HEART CATH AND CORONARY ANGIOGRAPHY;  Surgeon: Dann Candyce RAMAN, MD;  Location: [facility] INVASIVE CV LAB;  Service: Cardiovascular;  Laterality: N/A;   SHOULDER ARTHROSCOPY WITH ROTATOR CUFF REPAIR AND OPEN BICEPS TENODESIS Right 11/28/2021   Procedure: RIGHT SHOULDER ARTHROSCOPY WITH ROTATOR CUFF REPAIR AND OPEN BICEPS TENODESIS;  Surgeon: Genelle Standing, MD;  Location: [facility] OR;  Service: Orthopedics;  Laterality: Right;   SHOULDER ARTHROSCOPY WITH ROTATOR CUFF REPAIR AND OPEN BICEPS TENODESIS Left 07/21/2022   Procedure: LEFT SHOULDER ARTHROSCOPY WITH ROTATOR CUFF REPAIR AND BICEPS TENODESIS;  Surgeon: Genelle Standing, MD;  Location: [facility] OR;  Service: Orthopedics;  Laterality: Left;   TEE WITHOUT CARDIOVERSION N/A 05/25/2023   Procedure: TRANSESOPHAGEAL ECHOCARDIOGRAM (TEE);  Surgeon: Lucas Dorise POUR, MD;  Location: [facility] OR;  Service: Open Heart Surgery;  Laterality: N/A;   TONSILLECTOMY     TOTAL HIP ARTHROPLASTY Right 04/26/2018   Procedure: RIGHT TOTAL HIP ARTHROPLASTY ANTERIOR APPROACH;  Surgeon: Jerri Kay HERO, MD;  Location: [facility] OR;  Service: Orthopedics;  Laterality: Right;   TOTAL HIP ARTHROPLASTY Left 11/15/2018   TOTAL HIP ARTHROPLASTY Left 11/15/2018   Procedure: LEFT TOTAL HIP ARTHROPLASTY ANTERIOR APPROACH;  Surgeon: Jerri Kay HERO, MD;  Location: [facility] OR;  Service: Orthopedics;  Laterality: Left;       Home Medications    Prior to Admission medications  Medication Sig Start Date End Date Taking? Authorizing Provider  carvedilol  (COREG ) 3.125 MG tablet TAKE 1 TABLET BY MOUTH 2 TIMES DAILY. 04/19/24  Yes Rothfuss, Jacob T, PA-C  clindamycin  (CLEOCIN ) 300 MG capsule Take 1 capsule (300 mg total) by mouth 3 (three) times daily after meals for 10 days. 04/19/24 04/29/24 Yes Ival Domino, FNP  cycloSPORINE (RESTASIS) 0.05 % ophthalmic emulsion Place 1 drop into both eyes 2 (two)  times daily. 01/23/24  Yes [provider]  Evolocumab  (REPATHA  SURECLICK) 140 MG/ML SOAJ INJECT 1 ML INTO THE SKIN EVERY 14 (FOURTEEN) DAYS. 10/02/23  Yes Croitoru, Mihai, MD  lisinopril  (ZESTRIL ) 20 MG tablet Take 1 tablet (20 mg total) by mouth daily. 01/28/24  Yes Rothfuss, Jacob T, PA-C  mupirocin  ointment (BACTROBAN ) 2 % Apply 1 Application topically 2 (two) times daily. 04/19/24  Yes Ival Domino, FNP  acetaminophen  (TYLENOL ) 325 MG tablet Take 2 tablets (650 mg total) by mouth every 6 (six) hours as needed. 05/29/23   Roddenberry, Myron G, PA-C  amiodarone  (PACERONE ) 200 MG tablet Take 1 tablet (200 mg total) by mouth 2 (two) times daily. For 7 days then decrease the dose to 1 tablet (200mg ) once daily. 06/09/23   Gold, Wayne E, PA-C  amoxicillin  (AMOXIL ) 500 MG tablet Take 4 tablets (2,000 mg total) by mouth once as needed for up to 1 dose (Take one hour prior to dental procedure). 07/01/23   Roddenberry, Myron G, PA-C  aspirin  EC 81 MG tablet Take  81 mg by mouth daily. Swallow whole.    [provider]  cromolyn (OPTICROM) 4 % ophthalmic solution Place 1 drop into both eyes in the morning and at bedtime. 11/24/23   [provider]  fluticasone  (FLONASE ) 50 MCG/ACT nasal spray Place 1 spray into both nostrils daily. 01/25/24   Rothfuss, Jacob T, PA-C  loratadine (CLARITIN) 10 MG tablet Take 10 mg by mouth daily as needed for allergies.    [provider]  methocarbamol  (ROBAXIN ) 500 MG tablet Take 1 tablet (500 mg total) by mouth 2 (two) times daily as needed for muscle spasms. 01/28/24   Rothfuss, Jacob T, PA-C  potassium chloride  SA (KLOR-CON  M) 20 MEQ tablet Take 1 tablet (20 mEq total) by mouth daily for 10 days. 06/09/23 01/28/24  Viviane Lemond BRAVO, PA-C    Family History Family History  Problem Relation Age of Onset   Dementia Mother    Hypertension Mother    CAD Father        MI/CABG age 71   Hypertension Father    Hyperlipidemia Father    Skin cancer  Father        basal cell   Prostate cancer Father    Thyroid disease Sister    Hyperlipidemia Sister    Colon cancer Maternal Grandfather     Social History Social History[1]   Allergies   Statins   Review of Systems Review of Systems  Constitutional:  Negative for fever.  Respiratory:  Negative for cough.   Cardiovascular:  Negative for chest pain.  Gastrointestinal:  Negative for abdominal pain, constipation, diarrhea, nausea and vomiting.  Musculoskeletal:  Negative for arthralgias and back pain.  Skin:  Positive for rash. Negative for color change.  Neurological:  Negative for syncope.  All other systems reviewed and are negative.    Physical Exam Triage Vital Signs ED Triage Vitals  Encounter Vitals Group     BP 04/19/24 1714 122/80     Girls Systolic BP Percentile --      Girls Diastolic BP Percentile --      Boys Systolic BP Percentile --      Boys Diastolic BP Percentile --      Pulse Rate 04/19/24 1714 74     Resp 04/19/24 1714 18     Temp 04/19/24 1714 97.9 F (36.6 C)     Temp Source 04/19/24 1714 Oral     SpO2 04/19/24 1714 95 %     Weight --      Height --      Head Circumference --      Peak Flow --      Pain Score 04/19/24 1713 10     Pain Loc --      Pain Education --      Exclude from Growth Chart --    No data found.  Updated Vital Signs BP 122/80 (BP Location: Right Arm)   Pulse 74   Temp 97.9 F (36.6 C) (Oral)   Resp 18   SpO2 95%   Visual Acuity Right Eye Distance:   Left Eye Distance:   Bilateral Distance:    Right Eye Near:   Left Eye Near:    Bilateral Near:     Physical Exam Vitals and nursing note reviewed.  Constitutional:      General: He is not in acute distress.    Appearance: He is well-developed. He is not ill-appearing or toxic-appearing.  HENT:     Head: Normocephalic and atraumatic.  Right Ear: Hearing, tympanic membrane, ear canal and external ear normal.     Left Ear: Hearing, tympanic membrane,  ear canal and external ear normal.     Nose: No congestion or rhinorrhea.     Right Sinus: No maxillary sinus tenderness or frontal sinus tenderness.     Left Sinus: No maxillary sinus tenderness or frontal sinus tenderness.     Mouth/Throat:     Lips: Pink.     Mouth: Mucous membranes are moist.     Pharynx: Uvula midline. No oropharyngeal exudate or posterior oropharyngeal erythema.     Tonsils: No tonsillar exudate.  Eyes:     Conjunctiva/sclera: Conjunctivae normal.     Pupils: Pupils are equal, round, and reactive to light.  Cardiovascular:     Rate and Rhythm: Normal rate and regular rhythm.     Heart sounds: S1 normal and S2 normal. No murmur heard. Pulmonary:     Effort: Pulmonary effort is normal. No respiratory distress.     Breath sounds: Normal breath sounds. No decreased breath sounds, wheezing, rhonchi or rales.  Abdominal:     General: Bowel sounds are normal.     Palpations: Abdomen is soft.     Tenderness: There is no abdominal tenderness.  Musculoskeletal:        General: No swelling.     Cervical back: Neck supple.  Lymphadenopathy:     Head:     Right side of head: No submental, submandibular, tonsillar, preauricular or posterior auricular adenopathy.     Left side of head: No submental, submandibular, tonsillar, preauricular or posterior auricular adenopathy.     Cervical: No cervical adenopathy.     Right cervical: No superficial cervical adenopathy.    Left cervical: No superficial cervical adenopathy.  Skin:    General: Skin is warm and dry.     Capillary Refill: Capillary refill takes less than 2 seconds.     Findings: Rash (See photos for more information.) present.      Neurological:     Mental Status: He is alert and oriented to person, place, and time.  Psychiatric:        Mood and Affect: Mood normal.               UC Treatments / Results  Labs (all labs ordered are listed, but only abnormal results are displayed) Labs Reviewed -  No data to display  EKG   Radiology No results found.  Procedures Wound care: Wounds cleaned with Hibiclens  foam.  Mupirocin  applied to the wounds.  Right forearm and right and left lower legs wrapped with Kling gauze and Coban by M. Fernande, CHARITY FUNDRAISER.  Medications Ordered in UC Medications - No data to display  Initial Impression / Assessment and Plan / UC Course  I have reviewed the triage vital signs and the nursing notes.  Pertinent labs & imaging results that were available during my care of the patient were reviewed by me and considered in my medical decision making (see chart for details).  Plan of Care (see discharge instructions for additional patient precautions and education): Cellulitis of right forearm, right lower leg and left lower leg with dependent edema: Clean the wounds with warm soapy finger, rinse, pat dry, apply thin coats of mupirocin  ointment to any open or weeping areas.  Apply gauze.  Clean the wounds at least once daily.  Clindamycin  300 mg 3 times daily with food for 10 days.  Follow-up with primary care.  Patient needs to  be managed at a wound care center or to see dermatology or both.  See below for signs and symptoms of worsening condition and reasons to go to an emergency room for possible sepsis or IV antibiotics.  I reviewed the plan of care with the patient and/or the patient's guardian.  The patient and/or guardian had time to ask questions and acknowledged that the questions were answered.  Final Clinical Impressions(s) / UC Diagnoses   Final diagnoses:  Dependent edema  Cellulitis of left lower leg  Cellulitis of right lower leg  Cellulitis of right forearm     Discharge Instructions      Cellulitis of right forearm, right lower leg and left lower leg with dependent edema: Clean the wounds with warm soapy finger, rinse, pat dry, apply thin coats of mupirocin  ointment to any open or weeping areas.  Apply gauze.  Clean the wounds at least once  daily.  Clindamycin  300 mg 3 times daily with food for 10 days.  Follow-up with primary care.  Patient needs to be managed at a wound care center or to see dermatology or both.  See below for signs and symptoms of worsening condition and reasons to go to an emergency room for possible sepsis or IV antibiotics.  Get help right away if: You have a high fever (101.5 or higher) You have worsening pain in the legs or arms where you have the infection You notice red streaks coming from the infected area. You notice the skin turns purple or black and falls off. This symptom may be an emergency. Get help right away. Call 911. Do not wait to see if the symptom will go away. Do not drive yourself to the hospital.     ED Prescriptions     Medication Sig Dispense Auth. Provider   mupirocin  ointment (BACTROBAN ) 2 % Apply 1 Application topically 2 (two) times daily. 22 g Ival Domino, FNP   clindamycin  (CLEOCIN ) 300 MG capsule Take 1 capsule (300 mg total) by mouth 3 (three) times daily after meals for 10 days. 30 capsule Ival Domino, FNP      PDMP not reviewed this encounter.    [1]  Social History Tobacco Use   Smoking status: Former    Current packs/day: 0.00    Average packs/day: 0.3 packs/day for 19.0 years (4.8 ttl pk-yrs)    Types: Cigarettes    Start date: 57    Quit date: 08/18/2000    Years since quitting: 23.6    Passive exposure: Past   Smokeless tobacco: Never   Tobacco comments:    Smoked lightly for approx 20 years, quit 1990s  Vaping Use   Vaping status: Never Used  Substance Use Topics   Alcohol use: Yes    Comment: occasionally   Drug use: Yes    Types: Marijuana    Comment: every night (to sleep)     Ival Domino, FNP 04/19/24 1751  "

## 2024-05-16 ENCOUNTER — Other Ambulatory Visit: Payer: Self-pay | Admitting: Surgical

## 2024-07-28 ENCOUNTER — Ambulatory Visit (HOSPITAL_BASED_OUTPATIENT_CLINIC_OR_DEPARTMENT_OTHER): Admitting: Student
